# Patient Record
Sex: Male | Born: 1960 | Hispanic: No | Marital: Married | Smoking: Never smoker
Health system: Southern US, Community
[De-identification: ages and names within clinical notes are randomized; demographics above are authoritative.]

## PROBLEM LIST (undated history)

## (undated) DIAGNOSIS — T7840XA Allergy, unspecified, initial encounter: Secondary | ICD-10-CM

## (undated) DIAGNOSIS — E119 Type 2 diabetes mellitus without complications: Secondary | ICD-10-CM

## (undated) DIAGNOSIS — E785 Hyperlipidemia, unspecified: Secondary | ICD-10-CM

## (undated) DIAGNOSIS — I10 Essential (primary) hypertension: Secondary | ICD-10-CM

## (undated) HISTORY — DX: Essential (primary) hypertension: I10

## (undated) HISTORY — DX: Allergy, unspecified, initial encounter: T78.40XA

## (undated) HISTORY — DX: Hyperlipidemia, unspecified: E78.5

## (undated) HISTORY — PX: CARDIAC VALVE REPLACEMENT: SHX585

## (undated) HISTORY — DX: Type 2 diabetes mellitus without complications: E11.9

---

## 2004-12-09 ENCOUNTER — Ambulatory Visit: Payer: Self-pay | Admitting: Cardiovascular Disease

## 2005-01-11 ENCOUNTER — Ambulatory Visit: Payer: Self-pay | Admitting: Oncology

## 2005-01-23 ENCOUNTER — Emergency Department: Payer: Self-pay | Admitting: Emergency Medicine

## 2005-01-24 ENCOUNTER — Other Ambulatory Visit: Payer: Self-pay

## 2005-02-28 ENCOUNTER — Ambulatory Visit: Payer: Self-pay | Admitting: Oncology

## 2014-06-19 LAB — LIPID PANEL
Cholesterol: 264 mg/dL — AB (ref 0–200)
HDL: 28 mg/dL — AB (ref 35–70)
Triglycerides: 914 mg/dL — AB (ref 40–160)

## 2014-06-19 LAB — HEMOGLOBIN A1C: Hgb A1c MFr Bld: 9.6 % — AB (ref 4.0–6.0)

## 2014-08-18 LAB — LIPID PANEL
Cholesterol: 242 mg/dL — AB (ref 0–200)
HDL: 24 mg/dL — AB (ref 35–70)
Triglycerides: 999 mg/dL — AB (ref 40–160)

## 2014-10-15 ENCOUNTER — Encounter: Payer: Self-pay | Admitting: *Deleted

## 2014-10-16 ENCOUNTER — Ambulatory Visit: Payer: Self-pay | Admitting: Endocrinology

## 2014-10-21 ENCOUNTER — Other Ambulatory Visit: Payer: Self-pay | Admitting: Family Medicine

## 2014-10-28 ENCOUNTER — Telehealth: Payer: Self-pay | Admitting: Family Medicine

## 2014-10-28 NOTE — Telephone Encounter (Signed)
E-Fax came through for prior authorization: Rx: Apidra Solostar Pen Rx on your desk

## 2014-10-30 ENCOUNTER — Telehealth: Payer: Self-pay | Admitting: Family Medicine

## 2014-10-30 MED ORDER — INSULIN GLARGINE 300 UNIT/ML ~~LOC~~ SOPN: 50.0000 | PEN_INJECTOR | Freq: Every day | SUBCUTANEOUS | Status: DC

## 2014-10-30 NOTE — Telephone Encounter (Signed)
E-Fax came through for refill: Rx: Insulin Glargine (TOUJEO SOLOSTAR [place]) Copy of Rx in basket

## 2014-11-24 ENCOUNTER — Encounter: Payer: Self-pay | Admitting: Family Medicine

## 2014-11-24 ENCOUNTER — Ambulatory Visit (INDEPENDENT_AMBULATORY_CARE_PROVIDER_SITE_OTHER): Payer: No Typology Code available for payment source | Admitting: Family Medicine

## 2014-11-24 VITALS — BP 147/89 | HR 89 | Temp 98.4°F | Ht 69.0 in | Wt 252.0 lb

## 2014-11-24 DIAGNOSIS — E1129 Type 2 diabetes mellitus with other diabetic kidney complication: Secondary | ICD-10-CM | POA: Insufficient documentation

## 2014-11-24 DIAGNOSIS — I10 Essential (primary) hypertension: Secondary | ICD-10-CM | POA: Diagnosis not present

## 2014-11-24 DIAGNOSIS — I152 Hypertension secondary to endocrine disorders: Secondary | ICD-10-CM | POA: Insufficient documentation

## 2014-11-24 DIAGNOSIS — R809 Proteinuria, unspecified: Secondary | ICD-10-CM | POA: Insufficient documentation

## 2014-11-24 DIAGNOSIS — I482 Chronic atrial fibrillation, unspecified: Secondary | ICD-10-CM | POA: Insufficient documentation

## 2014-11-24 DIAGNOSIS — E119 Type 2 diabetes mellitus without complications: Secondary | ICD-10-CM

## 2014-11-24 DIAGNOSIS — E1169 Type 2 diabetes mellitus with other specified complication: Secondary | ICD-10-CM | POA: Insufficient documentation

## 2014-11-24 DIAGNOSIS — E1159 Type 2 diabetes mellitus with other circulatory complications: Secondary | ICD-10-CM | POA: Insufficient documentation

## 2014-11-24 LAB — COAGUCHEK XS/INR WAIVED
INR: 2.4 — ABNORMAL HIGH (ref 0.9–1.1)
Prothrombin Time: 28.2 s

## 2014-11-24 MED ORDER — BENAZEPRIL HCL 40 MG PO TABS: 40.0000 mg | ORAL_TABLET | Freq: Every day | ORAL | Status: DC

## 2014-11-24 NOTE — Progress Notes (Signed)
   BP 147/89 mmHg  Pulse 89  Temp(Src) 98.4 F (36.9 C)  Ht 5\' 9"  (1.753 m)  Wt 252 lb (114.306 kg)  BMI 37.20 kg/m2  SpO2 96%   Subjective:    Patient ID: Rodney CharyPaul T Draheim [name], male    DOB: [birth year], 54 y.o.   MRN: [id number]  HPI: Rodney Biaul T Vert [name] is a 54 y.o. male  Chief Complaint  Patient presents with  . Atrial Fibrillation  . Hypertension   patient for follow-up atrial fibrillation taken warfarin every day without failure and doing well with no bruising bleeding signs or symptoms or complications.  Blood pressures been elevated taking medications every day  Has been out of Toujeo for 2 weeks as been unable to purchase due to insurance complications.  Relevant past medical, surgical, family and social history reviewed and updated as indicated. Interim medical history since our last visit reviewed. Allergies and medications reviewed and updated.  Review of Systems  Constitutional: Negative.   Respiratory: Negative.   Cardiovascular: Negative.     Per HPI unless specifically indicated above     Objective:    BP 147/89 mmHg  Pulse 89  Temp(Src) 98.4 F (36.9 C)  Ht 5\' 9"  (1.753 m)  Wt 252 lb (114.306 kg)  BMI 37.20 kg/m2  SpO2 96%  Wt Readings from Last 3 Encounters:  11/24/14 252 lb (114.306 kg)  08/18/14 258 lb (117.028 kg)    Physical Exam  Constitutional: He is oriented to person, place, and time. He appears well-developed and well-nourished. No distress.  HENT:  Head: Normocephalic and atraumatic.  Right Ear: Hearing normal.  Left Ear: Hearing normal.  Nose: Nose normal.  Eyes: Conjunctivae and lids are normal. Right eye exhibits no discharge. Left eye exhibits no discharge. No scleral icterus.  Cardiovascular: Normal rate, regular rhythm and normal heart sounds.   Pulmonary/Chest: Effort normal and breath sounds normal. No respiratory distress.  Musculoskeletal: Normal range of motion.  Neurological: He is alert and oriented to person, place, and  time.  Skin: Skin is intact. No rash noted.  Psychiatric: He has a normal mood and affect. His speech is normal and behavior is normal. Judgment and thought content normal. Cognition and memory are normal.    Results for orders placed or performed in visit on 11/24/14  CoaguChek XS/INR Waived  Result Value Ref Range   INR 2.4 (H) 0.9 - 1.1   Prothrombin Time 28.2 sec      Assessment & Plan:   Problem List Items Addressed This Visit      Cardiovascular and Mediastinum   Chronic atrial fibrillation - Primary    The current medical regimen is effective;  continue present plan and medications. Recheck PT/INR one month      Relevant Medications   benazepril (LOTENSIN) 40 MG tablet   Other Relevant Orders   CoaguChek XS/INR Waived (Completed)   Hypertension   Relevant Medications   benazepril (LOTENSIN) 40 MG tablet     Endocrine   Diabetes mellitus without complication    Reviewed medications gave samples coupons check with pharmacist for prescription that his insurance will cover and will give patient a prescription discussed importance of not going without medicine      Relevant Medications   benazepril (LOTENSIN) 40 MG tablet       Follow up plan: Return in about 4 weeks (around 12/22/2014) for Repeat INR, hemoglobin A1c.

## 2014-11-24 NOTE — Assessment & Plan Note (Signed)
The current medical regimen is effective;  continue present plan and medications. Recheck PT/INR one month

## 2014-11-24 NOTE — Assessment & Plan Note (Signed)
Reviewed medications gave samples coupons check with pharmacist for prescription that his insurance will cover and will give patient a prescription discussed importance of not going without medicine

## 2014-12-08 ENCOUNTER — Other Ambulatory Visit: Payer: Self-pay | Admitting: Family Medicine

## 2014-12-08 NOTE — Telephone Encounter (Signed)
Rodney Dixon had previously denied this medication, due to thinking patient has not been seen. Sending to you since you are taking care of Dr.Crissman's.

## 2014-12-25 ENCOUNTER — Ambulatory Visit (INDEPENDENT_AMBULATORY_CARE_PROVIDER_SITE_OTHER): Payer: No Typology Code available for payment source | Admitting: Family Medicine

## 2014-12-25 ENCOUNTER — Encounter: Payer: Self-pay | Admitting: Family Medicine

## 2014-12-25 VITALS — BP 133/83 | HR 86 | Temp 97.7°F | Ht 70.0 in | Wt 262.0 lb

## 2014-12-25 DIAGNOSIS — E119 Type 2 diabetes mellitus without complications: Secondary | ICD-10-CM | POA: Diagnosis not present

## 2014-12-25 DIAGNOSIS — I482 Chronic atrial fibrillation, unspecified: Secondary | ICD-10-CM

## 2014-12-25 DIAGNOSIS — I10 Essential (primary) hypertension: Secondary | ICD-10-CM

## 2014-12-25 LAB — COAGUCHEK XS/INR WAIVED
INR: 3.2 — ABNORMAL HIGH (ref 0.9–1.1)
Prothrombin Time: 38.9 s

## 2014-12-25 LAB — BAYER DCA HB A1C WAIVED: HB A1C (BAYER DCA - WAIVED): 7.9 % — ABNORMAL HIGH (ref <7.0)

## 2014-12-25 MED ORDER — WARFARIN SODIUM 10 MG PO TABS: 10.0000 mg | ORAL_TABLET | Freq: Every day | ORAL | Status: DC

## 2014-12-25 MED ORDER — WARFARIN SODIUM 2 MG PO TABS
2.0000 mg | ORAL_TABLET | Freq: Every day | ORAL | Status: DC
Start: 2014-12-25 — End: 2014-12-27

## 2014-12-25 NOTE — Assessment & Plan Note (Signed)
Doing better with good control now

## 2014-12-25 NOTE — Progress Notes (Signed)
Not my patient

## 2014-12-25 NOTE — Assessment & Plan Note (Signed)
A. fib stable INR slightly elevated will eat more green stuff recheck PT INR 1 month.

## 2014-12-25 NOTE — Progress Notes (Signed)
BP 133/83 mmHg  Pulse 86  Temp(Src) 97.7 F (36.5 C)  Ht  (1.778 m)  Wt 262 lb (118.842 kg)  BMI 37.59 kg/m2  SpO2 97%   Subjective:    Patient ID: Rodney Chary [name], male    DOB: May 05, 1961, 54 y.o.   MRN: [id number]  HPI: Rodney Oommen. is a 54 y.o. male  Chief Complaint  Patient presents with  . Diabetes  . Anticoagulation   reason for follow-up diabetes doing better taking shots not doing [place] yet. Urinary frequency has helped some Blood pressure doing well no complaints from medication. Warfarin doing well no complaints of bruising has not been eating much green stuff lately.  Relevant past medical, surgical, family and social history reviewed and updated as indicated. Interim medical history since our last visit reviewed. Allergies and medications reviewed and updated.  Review of Systems  Constitutional: Negative.   Respiratory: Negative.   Cardiovascular: Negative.     Per HPI unless specifically indicated above     Objective:    BP 133/83 mmHg  Pulse 86  Temp(Src) 97.7 F (36.5 C)  Ht  (1.778 m)  Wt 262 lb (118.842 kg)  BMI 37.59 kg/m2  SpO2 97%  Wt Readings from Last 3 Encounters:  12/25/14 262 lb (118.842 kg)  11/24/14 252 lb (114.306 kg)  08/18/14 258 lb (117.028 kg)    Physical Exam  Constitutional: He is oriented to person, place, and time. He appears well-developed and well-nourished. No distress.  HENT:  Head: Normocephalic and atraumatic.  Right Ear: Hearing normal.  Left Ear: Hearing normal.  Nose: Nose normal.  Eyes: Conjunctivae and lids are normal. Right eye exhibits no discharge. Left eye exhibits no discharge. No scleral icterus.  Cardiovascular: Normal rate, regular rhythm, normal heart sounds and intact distal pulses.   Pulmonary/Chest: Effort normal and breath sounds normal. No respiratory distress.  Musculoskeletal: Normal range of motion.  Neurological: He is alert and oriented to person, place, and time.   Skin: Skin is intact. No rash noted.  Psychiatric: He has a normal mood and affect. His speech is normal and behavior is normal. Judgment and thought content normal. Cognition and memory are normal.    Results for orders placed or performed in visit on 11/24/14  CoaguChek XS/INR Waived  Result Value Ref Range   INR 2.4 (H) 0.9 - 1.1   Prothrombin Time 28.2 sec      Assessment & Plan:   Problem List Items Addressed This Visit      Cardiovascular and Mediastinum   Chronic atrial fibrillation    A. fib stable INR slightly elevated will eat more green stuff recheck PT INR 1 month.      Relevant Medications   warfarin (COUMADIN) 2 MG tablet   warfarin (COUMADIN) 10 MG tablet   Other Relevant Orders   CoaguChek XS/INR Waived   Hypertension    Doing better with good control now      Relevant Medications   warfarin (COUMADIN) 2 MG tablet   warfarin (COUMADIN) 10 MG tablet     Endocrine   Diabetes mellitus without complication - Primary    Patient using medicine faithfully once a lowest it's been in a long time.      Relevant Orders   Bayer DCA Hb A1c Waived       Follow up plan: Return in about 4 weeks (around 01/22/2015), or P, for for INR 3 mo  for PE.

## 2014-12-25 NOTE — Assessment & Plan Note (Signed)
Patient using medicine faithfully once a lowest it's been in a long time.

## 2014-12-27 ENCOUNTER — Other Ambulatory Visit: Payer: Self-pay | Admitting: Family Medicine

## 2015-01-14 ENCOUNTER — Other Ambulatory Visit: Payer: Self-pay | Admitting: Family Medicine

## 2015-01-29 ENCOUNTER — Ambulatory Visit (INDEPENDENT_AMBULATORY_CARE_PROVIDER_SITE_OTHER): Payer: No Typology Code available for payment source | Admitting: Family Medicine

## 2015-01-29 ENCOUNTER — Encounter: Payer: Self-pay | Admitting: Family Medicine

## 2015-01-29 VITALS — BP 125/82 | HR 89 | Temp 98.5°F | Wt 264.0 lb

## 2015-01-29 DIAGNOSIS — Z954 Presence of other heart-valve replacement: Secondary | ICD-10-CM | POA: Insufficient documentation

## 2015-01-29 DIAGNOSIS — Z23 Encounter for immunization: Secondary | ICD-10-CM

## 2015-01-29 DIAGNOSIS — I482 Chronic atrial fibrillation, unspecified: Secondary | ICD-10-CM

## 2015-01-29 DIAGNOSIS — Z952 Presence of prosthetic heart valve: Secondary | ICD-10-CM | POA: Insufficient documentation

## 2015-01-29 LAB — COAGUCHEK XS/INR WAIVED
INR: 3.4 — ABNORMAL HIGH (ref 0.9–1.1)
Prothrombin Time: 40.4 s

## 2015-01-29 MED ORDER — WARFARIN SODIUM 2 MG PO TABS: 2.0000 mg | ORAL_TABLET | Freq: Every day | ORAL | Status: DC

## 2015-01-29 MED ORDER — WARFARIN SODIUM 10 MG PO TABS: 10.0000 mg | ORAL_TABLET | Freq: Every day | ORAL | Status: DC

## 2015-01-29 NOTE — Assessment & Plan Note (Signed)
Stable INR cont current meds

## 2015-01-29 NOTE — Progress Notes (Signed)
   BP 125/82 mmHg  Pulse 89  Temp(Src) 98.5 F (36.9 C)  Wt 264 lb (119.75 kg)  SpO2 97%   Subjective:    Patient ID: Rodney Chary [name], male    DOB: [birth year], 54 y.o.   MRN: [id number]  HPI: Rodney Umholtz. is a 54 y.o. male  Chief Complaint  Patient presents with  . Anticoagulation   Patient follow-up had valve replacement doing well no issues with warfarin no bleeding no bruising Taking medications faithfully Diabetes is also doing well. Still having weight loss issues Relevant past medical, surgical, family and social history reviewed and updated as indicated. Interim medical history since our last visit reviewed. Allergies and medications reviewed and updated.  Review of Systems  Per HPI unless specifically indicated above     Objective:    BP 125/82 mmHg  Pulse 89  Temp(Src) 98.5 F (36.9 C)  Wt 264 lb (119.75 kg)  SpO2 97%  Wt Readings from Last 3 Encounters:  01/29/15 264 lb (119.75 kg)  12/25/14 262 lb (118.842 kg)  11/24/14 252 lb (114.306 kg)    Physical Exam  Constitutional: He is oriented to person, place, and time. He appears well-developed and well-nourished. No distress.  HENT:  Head: Normocephalic and atraumatic.  Right Ear: Hearing normal.  Left Ear: Hearing normal.  Nose: Nose normal.  Eyes: Conjunctivae and lids are normal. Right eye exhibits no discharge. Left eye exhibits no discharge. No scleral icterus.  Cardiovascular: Normal rate, regular rhythm and normal heart sounds.   Pulmonary/Chest: Effort normal and breath sounds normal. No respiratory distress.  Musculoskeletal: Normal range of motion.  Neurological: He is alert and oriented to person, place, and time.  Skin: Skin is intact. No rash noted.  Psychiatric: He has a normal mood and affect. His speech is normal and behavior is normal. Judgment and thought content normal. Cognition and memory are normal.    Results for orders placed or performed in visit on 12/25/14  Bayer  DCA Hb A1c Waived  Result Value Ref Range   Bayer DCA Hb A1c Waived 7.9 (H) <7.0 %  CoaguChek XS/INR Waived  Result Value Ref Range   INR 3.2 (H) 0.9 - 1.1   Prothrombin Time 38.9 sec      Assessment & Plan:   Problem List Items Addressed This Visit      Cardiovascular and Mediastinum   Chronic atrial fibrillation - Primary   Relevant Medications   warfarin (COUMADIN) 2 MG tablet   warfarin (COUMADIN) 10 MG tablet   Other Relevant Orders   CoaguChek XS/INR Waived     Other   S/P aortic valve replacement with metallic valve    Stable INR cont current meds       Other Visit Diagnoses    Immunization due        Relevant Orders    Flu Vaccine QUAD 36+ mos PF IM (Fluarix & Fluzone Quad PF) (Completed)        Follow up plan: Return in about 4 weeks (around 02/26/2015) for INR only 1 month with office visit.

## 2015-02-08 ENCOUNTER — Other Ambulatory Visit: Payer: Self-pay | Admitting: Family Medicine

## 2015-03-02 ENCOUNTER — Encounter: Payer: Self-pay | Admitting: Family Medicine

## 2015-03-02 ENCOUNTER — Ambulatory Visit (INDEPENDENT_AMBULATORY_CARE_PROVIDER_SITE_OTHER): Payer: No Typology Code available for payment source | Admitting: Family Medicine

## 2015-03-02 VITALS — BP 130/80 | HR 108 | Temp 98.9°F | Ht 69.2 in | Wt 263.0 lb

## 2015-03-02 DIAGNOSIS — Z954 Presence of other heart-valve replacement: Secondary | ICD-10-CM | POA: Diagnosis not present

## 2015-03-02 DIAGNOSIS — I482 Chronic atrial fibrillation, unspecified: Secondary | ICD-10-CM

## 2015-03-02 DIAGNOSIS — R079 Chest pain, unspecified: Secondary | ICD-10-CM | POA: Diagnosis not present

## 2015-03-02 LAB — COAGUCHEK XS/INR WAIVED
INR: 4.9 — ABNORMAL HIGH (ref 0.9–1.1)
Prothrombin Time: 58.3 s

## 2015-03-02 MED ORDER — INSULIN GLULISINE 100 UNIT/ML SOLOSTAR PEN: 20.0000 [IU] | PEN_INJECTOR | Freq: Three times a day (TID) | SUBCUTANEOUS | Status: DC

## 2015-03-02 MED ORDER — METFORMIN HCL 500 MG PO TABS: ORAL_TABLET | ORAL | Status: DC

## 2015-03-02 MED ORDER — CARVEDILOL 25 MG PO TABS: 25.0000 mg | ORAL_TABLET | Freq: Two times a day (BID) | ORAL | Status: DC

## 2015-03-02 MED ORDER — ALBIGLUTIDE 50 MG ~~LOC~~ PEN: 1.0000 "pen " | PEN_INJECTOR | SUBCUTANEOUS | Status: DC

## 2015-03-02 MED ORDER — BENAZEPRIL HCL 40 MG PO TABS: ORAL_TABLET | ORAL | Status: DC

## 2015-03-02 MED ORDER — CANAGLIFLOZIN 300 MG PO TABS: 300.0000 mg | ORAL_TABLET | Freq: Every day | ORAL | Status: DC

## 2015-03-02 MED ORDER — GABAPENTIN 300 MG PO CAPS: 600.0000 mg | ORAL_CAPSULE | Freq: Two times a day (BID) | ORAL | Status: DC

## 2015-03-02 MED ORDER — HYDROCHLOROTHIAZIDE 25 MG PO TABS: 25.0000 mg | ORAL_TABLET | Freq: Every day | ORAL | Status: DC

## 2015-03-02 NOTE — Assessment & Plan Note (Signed)
Patient's chest pain most likely secondary to musculoskeletal strain from carrying heavy items. On chart review especially cardiology note patient has not had any follow-up of his aortic valve replacement for years or any cardiology follow-up in kind. We'll make cardiology referral to follow-up chest pain and cardiac valve replacement

## 2015-03-02 NOTE — Progress Notes (Signed)
BP 130/80 mmHg  Pulse 108  Temp(Src) 98.9 F (37.2 C)  Ht 5' 9.2" (1.758 m)  Wt 263 lb (119.296 kg)  BMI 38.60 kg/m2  SpO2 98%   Subjective:    Patient ID: Rodney Chary [name], male    DOB: [birth year], 54 y.o.   MRN: [id number]  HPI: Rodney Dixon. is a 54 y.o. male  Chief Complaint  Patient presents with  . Atrial Fibrillation   patient with multiple conditions going on Has been taken warfarin 12 mg every day without fail same dose he has been on long-term. With good INRs. Patient's INR today of 4.9 patient with no bleeding but does have burns on his arms with scabs that he picks and lose blood. Patient also has run out of carvedilol 25 mg has high blood pressure and rapid pulse beat today Patient also has been out of Toujeo insulin due to insurance constraints Has not taken his 80 units daily that he has been taking. Patient over this weekend was carrying some heavy buckets and developed some left upper chest pain no radiation no nausea vomiting diaphoresis and still having pain. Patient relates maybe more like a strained muscle from working out too much.   Relevant past medical, surgical, family and social history reviewed and updated as indicated. Interim medical history since our last visit reviewed. Allergies and medications reviewed and updated.  Review of Systems  Constitutional: Positive for fatigue. Negative for fever, chills, diaphoresis and activity change.  HENT: Negative.   Eyes: Negative.   Respiratory: Positive for shortness of breath. Negative for cough, choking and chest tightness.   Cardiovascular: Positive for chest pain, palpitations and leg swelling.  Gastrointestinal: Positive for abdominal distention. Negative for abdominal pain.  Endocrine: Positive for polydipsia and polyuria. Negative for polyphagia.  Genitourinary: Negative for dysuria, frequency, hematuria, flank pain and difficulty urinating.  Musculoskeletal: Negative for myalgias and  arthralgias.  Skin: Positive for wound.  Neurological: Negative for dizziness, numbness and headaches.  Hematological: Bruises/bleeds easily.  Psychiatric/Behavioral: Negative for behavioral problems, confusion and agitation.    Per HPI unless specifically indicated above     Objective:    BP 130/80 mmHg  Pulse 108  Temp(Src) 98.9 F (37.2 C)  Ht 5' 9.2" (1.758 m)  Wt 263 lb (119.296 kg)  BMI 38.60 kg/m2  SpO2 98%  Wt Readings from Last 3 Encounters:  03/02/15 263 lb (119.296 kg)  01/29/15 264 lb (119.75 kg)  12/25/14 262 lb (118.842 kg)    Physical Exam  Constitutional: He is oriented to person, place, and time. He appears well-developed and well-nourished. No distress.  HENT:  Head: Normocephalic and atraumatic.  Right Ear: Hearing and external ear normal.  Left Ear: Hearing and external ear normal.  Nose: Nose normal.  Eyes: Conjunctivae, EOM and lids are normal. Pupils are equal, round, and reactive to light. Right eye exhibits no discharge. Left eye exhibits no discharge. No scleral icterus.  Neck: Normal range of motion. Neck supple. No tracheal deviation present.  Cardiovascular:  Tachycardia with irregular rhythm  Pulmonary/Chest: Effort normal. No respiratory distress. He has no wheezes. He has no rales.  Abdominal: Soft. Bowel sounds are normal.  Musculoskeletal: Normal range of motion. He exhibits no edema or tenderness.  Lymphadenopathy:    He has no cervical adenopathy.  Neurological: He is alert and oriented to person, place, and time. He displays normal reflexes.  Skin: Skin is intact. No rash noted.  Psychiatric: He has a normal  mood and affect. His speech is normal and behavior is normal. Judgment and thought content normal. Cognition and memory are normal.   EKG sinus tachycardia changes  Results for orders placed or performed in visit on 01/29/15  CoaguChek XS/INR Waived  Result Value Ref Range   INR 3.4 (H) 0.9 - 1.1   Prothrombin Time 40.4 sec       Assessment & Plan:   Problem List Items Addressed This Visit      Cardiovascular and Mediastinum   Chronic atrial fibrillation (HCC) - Primary   Relevant Medications   benazepril (LOTENSIN) 40 MG tablet   carvedilol (COREG) 25 MG tablet   hydrochlorothiazide (HYDRODIURIL) 25 MG tablet   Other Relevant Orders   CoaguChek XS/INR Waived   EKG 12-Lead (Completed)   Ambulatory referral to Cardiology     Other   S/P aortic valve replacement with metallic valve    INR previously stable all summer long on 12 mg day with no noted change by patient no double dosing no significant change in diet We will hold warfarin today and tomorrow. Then restart same dose recheck INR in 1 week Precautions given about bleeding and trauma      Relevant Orders   Ambulatory referral to Cardiology   Chest pain    Patient's chest pain most likely secondary to musculoskeletal strain from carrying heavy items. On chart review especially cardiology note patient has not had any follow-up of his aortic valve replacement for years or any cardiology follow-up in kind. We'll make cardiology referral to follow-up chest pain and cardiac valve replacement      Relevant Orders   Ambulatory referral to Cardiology       Follow up plan: Return 1 week for INR, for pending w/u also.

## 2015-03-02 NOTE — Assessment & Plan Note (Signed)
INR previously stable all summer long on 12 mg day with no noted change by patient no double dosing no significant change in diet We will hold warfarin today and tomorrow. Then restart same dose recheck INR in 1 week Precautions given about bleeding and trauma

## 2015-03-09 ENCOUNTER — Ambulatory Visit (INDEPENDENT_AMBULATORY_CARE_PROVIDER_SITE_OTHER): Payer: No Typology Code available for payment source | Admitting: Family Medicine

## 2015-03-09 ENCOUNTER — Encounter: Payer: Self-pay | Admitting: Family Medicine

## 2015-03-09 VITALS — BP 153/92 | HR 89 | Temp 97.8°F | Ht 69.2 in | Wt 266.0 lb

## 2015-03-09 DIAGNOSIS — I482 Chronic atrial fibrillation, unspecified: Secondary | ICD-10-CM

## 2015-03-09 LAB — COAGUCHEK XS/INR WAIVED
INR: 1.1 (ref 0.9–1.1)
Prothrombin Time: 13.2 s

## 2015-03-09 NOTE — Progress Notes (Signed)
   BP 153/92 mmHg  Pulse 89  Temp(Src) 97.8 F (36.6 C)  Ht 5' 9.2" (1.758 m)  Wt 266 lb (120.657 kg)  BMI 39.04 kg/m2  SpO2 99%   Subjective:    Patient ID: Rodney CharyPaul T Luby [name], male    DOB: 05-25-1960, 54 y.o.   MRN: [id number]  HPI: Rodney Biaul T Ruggiero [name] is a 54 y.o. male  Chief Complaint  Patient presents with  . Atrial Fibrillation   Patient held his warfarin for high INR levels patient with some bleeding on his arms during this time which is resolved. Patient is started back at 10 mg and INR is now 1.1 age with no complaints Relevant past medical, surgical, family and social history reviewed and updated as indicated. Interim medical history since our last visit reviewed. Allergies and medications reviewed and updated.  Review of Systems  No bleeding bruising  Per HPI unless specifically indicated above     Objective:    BP 153/92 mmHg  Pulse 89  Temp(Src) 97.8 F (36.6 C)  Ht 5' 9.2" (1.758 m)  Wt 266 lb (120.657 kg)  BMI 39.04 kg/m2  SpO2 99%  Wt Readings from Last 3 Encounters:  03/09/15 266 lb (120.657 kg)  03/02/15 263 lb (119.296 kg)  01/29/15 264 lb (119.75 kg)    Physical Exam  Constitutional: He is oriented to person, place, and time. He appears well-developed and well-nourished. No distress.  HENT:  Head: Normocephalic and atraumatic.  Right Ear: Hearing normal.  Left Ear: Hearing normal.  Nose: Nose normal.  Eyes: Conjunctivae and lids are normal. Right eye exhibits no discharge. Left eye exhibits no discharge. No scleral icterus.  Pulmonary/Chest: Effort normal and breath sounds normal. No respiratory distress.  Musculoskeletal: Normal range of motion.  Neurological: He is alert and oriented to person, place, and time.  Skin: Skin is intact. No rash noted.  Psychiatric: He has a normal mood and affect. His speech is normal and behavior is normal. Judgment and thought content normal. Cognition and memory are normal.    Results for orders placed  or performed in visit on 03/02/15  CoaguChek XS/INR Waived  Result Value Ref Range   INR 4.9 (H) 0.9 - 1.1   Prothrombin Time 58.3 sec      Assessment & Plan:   Problem List Items Addressed This Visit      Cardiovascular and Mediastinum   Chronic atrial fibrillation (HCC) - Primary   Relevant Orders   CoaguChek XS/INR Waived       Follow up plan: Return in about 2 weeks (around 03/23/2015) for PT INR.

## 2015-03-09 NOTE — Assessment & Plan Note (Signed)
Discuss low INR and abnormal results will go back to 12 mg a day Recheck INR 2 weeks

## 2015-03-18 ENCOUNTER — Encounter: Payer: No Typology Code available for payment source | Admitting: Family Medicine

## 2015-03-19 ENCOUNTER — Other Ambulatory Visit: Payer: Self-pay | Admitting: Family Medicine

## 2015-03-19 MED ORDER — INSULIN LISPRO 100 UNIT/ML (KWIKPEN): 20.0000 [IU] | PEN_INJECTOR | Freq: Three times a day (TID) | SUBCUTANEOUS | Status: DC

## 2015-03-20 ENCOUNTER — Telehealth: Payer: Self-pay | Admitting: Family Medicine

## 2015-03-20 NOTE — Telephone Encounter (Signed)
Pharmacy checking status of PA for Apidra.

## 2015-03-23 NOTE — Telephone Encounter (Signed)
Medication was denied

## 2015-04-07 ENCOUNTER — Encounter: Payer: Self-pay | Admitting: Family Medicine

## 2015-04-07 ENCOUNTER — Ambulatory Visit (INDEPENDENT_AMBULATORY_CARE_PROVIDER_SITE_OTHER): Payer: No Typology Code available for payment source | Admitting: Family Medicine

## 2015-04-07 VITALS — BP 124/87 | HR 91 | Temp 98.9°F | Ht 69.2 in | Wt 272.0 lb

## 2015-04-07 DIAGNOSIS — J019 Acute sinusitis, unspecified: Secondary | ICD-10-CM | POA: Insufficient documentation

## 2015-04-07 MED ORDER — AMOXICILLIN-POT CLAVULANATE 875-125 MG PO TABS: 1.0000 | ORAL_TABLET | Freq: Two times a day (BID) | ORAL | Status: DC

## 2015-04-07 MED ORDER — HYDROCOD POLST-CPM POLST ER 10-8 MG/5ML PO SUER: 2.5000 mL | Freq: Two times a day (BID) | ORAL | Status: DC | PRN

## 2015-04-07 NOTE — Progress Notes (Signed)
BP 124/87 mmHg  Pulse 91  Temp(Src) 98.9 F (37.2 C)  Ht 5' 9.2" (1.758 m)  Wt 272 lb (123.378 kg)  BMI 39.92 kg/m2  SpO2 92%   Subjective:    Patient ID: Rodney CharyPaul T Melfi [name], male    DOB: [birth year], 54 y.o.   MRN: [id number]  HPI: Rodney Biaul T Hannay [name] is a 54 y.o. male  Chief Complaint  Patient presents with  . URI    x4days   patient with marked sinus congestion and drainage feeling bad marked facial pressure sloshing-type sensation  Has taken Mcinex Mucinex D  Has been running a fever and getting worse with marked cough and not sleeping   Relevant past medical, surgical, family and social history reviewed and updated as indicated. Interim medical history since our last visit reviewed. Allergies and medications reviewed and updated.  Review of Systems  Constitutional: Positive for fever, chills and fatigue.  HENT: Positive for congestion, postnasal drip, rhinorrhea, sinus pressure, sneezing, sore throat and voice change.   Respiratory: Positive for cough. Negative for shortness of breath.   Cardiovascular: Negative for chest pain, palpitations and leg swelling.  Gastrointestinal: Negative for abdominal distention.    Per HPI unless specifically indicated above     Objective:    BP 124/87 mmHg  Pulse 91  Temp(Src) 98.9 F (37.2 C)  Ht 5' 9.2" (1.758 m)  Wt 272 lb (123.378 kg)  BMI 39.92 kg/m2  SpO2 92%  Wt Readings from Last 3 Encounters:  04/07/15 272 lb (123.378 kg)  03/09/15 266 lb (120.657 kg)  03/02/15 263 lb (119.296 kg)    Physical Exam  Constitutional: He is oriented to person, place, and time. He appears well-developed and well-nourished. No distress.  HENT:  Head: Normocephalic and atraumatic.  Right Ear: Hearing and external ear normal.  Left Ear: Hearing and external ear normal.  Nose: Nose normal.  Mouth/Throat: Oropharyngeal exudate present.  Eyes: Conjunctivae and lids are normal. Right eye exhibits no discharge. Left eye exhibits no  discharge. No scleral icterus.  Neck: Normal range of motion.  Cardiovascular: Normal heart sounds.   Pulmonary/Chest: Effort normal and breath sounds normal. No respiratory distress. He has no wheezes. He has no rales. He exhibits no tenderness.  Musculoskeletal: Normal range of motion.  Lymphadenopathy:    He has no cervical adenopathy.  Neurological: He is alert and oriented to person, place, and time.  Skin: Skin is intact. No rash noted.  Psychiatric: He has a normal mood and affect. His speech is normal and behavior is normal. Judgment and thought content normal. Cognition and memory are normal.    Results for orders placed or performed in visit on 03/09/15  CoaguChek XS/INR Waived  Result Value Ref Range   INR 1.1 0.9 - 1.1   Prothrombin Time 13.2 sec      Assessment & Plan:   Problem List Items Addressed This Visit      Respiratory   Sinusitis, acute - Primary    Discussed sinusitis care and treatment use of over-the-counter medications such as Mucinex Mucinex D, Tylenol Cautioned about use of Tussionex containing codeine not to drive or work using medicine just use at nighttime If not better will need to recheck Discuss ways to minimize side effects from Augmentin      Relevant Medications   amoxicillin-clavulanate (AUGMENTIN) 875-125 MG tablet   chlorpheniramine-HYDROcodone (TUSSIONEX PENNKINETIC ER) 10-8 MG/5ML SUER       Follow up plan: Return for As scheduled.

## 2015-04-07 NOTE — Assessment & Plan Note (Addendum)
Discussed sinusitis care and treatment use of over-the-counter medications such as Mucinex Mucinex D, Tylenol Cautioned about use of Tussionex containing codeine not to drive or work using medicine just use at nighttime If not better will need to recheck Discuss ways to minimize side effects from Augmentin

## 2015-04-10 ENCOUNTER — Other Ambulatory Visit: Payer: Self-pay | Admitting: Family Medicine

## 2015-04-15 ENCOUNTER — Encounter: Payer: Self-pay | Admitting: Family Medicine

## 2015-04-15 ENCOUNTER — Ambulatory Visit (INDEPENDENT_AMBULATORY_CARE_PROVIDER_SITE_OTHER): Payer: No Typology Code available for payment source | Admitting: Family Medicine

## 2015-04-15 VITALS — BP 166/96 | HR 94 | Temp 98.1°F | Ht 69.0 in | Wt 262.0 lb

## 2015-04-15 DIAGNOSIS — E119 Type 2 diabetes mellitus without complications: Secondary | ICD-10-CM

## 2015-04-15 DIAGNOSIS — Z113 Encounter for screening for infections with a predominantly sexual mode of transmission: Secondary | ICD-10-CM

## 2015-04-15 DIAGNOSIS — I482 Chronic atrial fibrillation, unspecified: Secondary | ICD-10-CM

## 2015-04-15 DIAGNOSIS — Z1211 Encounter for screening for malignant neoplasm of colon: Secondary | ICD-10-CM | POA: Diagnosis not present

## 2015-04-15 DIAGNOSIS — E785 Hyperlipidemia, unspecified: Secondary | ICD-10-CM

## 2015-04-15 DIAGNOSIS — Z0000 Encounter for general adult medical examination without abnormal findings: Secondary | ICD-10-CM

## 2015-04-15 DIAGNOSIS — E1169 Type 2 diabetes mellitus with other specified complication: Secondary | ICD-10-CM | POA: Insufficient documentation

## 2015-04-15 LAB — URINALYSIS, ROUTINE W REFLEX MICROSCOPIC
Bilirubin, UA: NEGATIVE
Ketones, UA: NEGATIVE
Leukocytes, UA: NEGATIVE
Nitrite, UA: NEGATIVE
Specific Gravity, UA: >1.03 — ABNORMAL HIGH (ref 1.005–1.030)
Urobilinogen, Ur: 0.2 mg/dL (ref 0.2–1.0)
pH, UA: 6 (ref 5.0–7.5)

## 2015-04-15 LAB — MICROSCOPIC EXAMINATION

## 2015-04-15 LAB — COAGUCHEK XS/INR WAIVED
INR: 3.7 — ABNORMAL HIGH (ref 0.9–1.1)
Prothrombin Time: 44.3 s

## 2015-04-15 LAB — BAYER DCA HB A1C WAIVED: HB A1C (BAYER DCA - WAIVED): 9.4 % — ABNORMAL HIGH (ref <7.0)

## 2015-04-15 MED ORDER — WARFARIN SODIUM 2 MG PO TABS: 2.0000 mg | ORAL_TABLET | Freq: Every day | ORAL | Status: DC

## 2015-04-15 MED ORDER — WARFARIN SODIUM 10 MG PO TABS: 10.0000 mg | ORAL_TABLET | Freq: Every day | ORAL | Status: DC

## 2015-04-15 MED ORDER — BENZONATATE 100 MG PO CAPS: 100.0000 mg | ORAL_CAPSULE | Freq: Two times a day (BID) | ORAL | Status: DC | PRN

## 2015-04-15 NOTE — Progress Notes (Signed)
BP 166/96 mmHg  Pulse 94  Temp(Src) 98.1 F (36.7 C)  Ht 5\' 9"  (1.753 m)  Wt 262 lb (118.842 kg)  BMI 38.67 kg/m2  SpO2 93%   Subjective:    Patient ID: Rodney CharyPaul T Bullinger [name], male    DOB: 02/03/1961, 54 y.o.   MRN: [id number]  HPI: Rodney Biaul T Bugge [name] is a 54 y.o. male  Chief Complaint  Patient presents with  . Annual Exam  . Atrial Fibrillation  . Diabetes   Patient with multiple issues finished up antibiotic somewhat better but still coughing his head off especially at night has finished us in next but needs something else for cough. A. fib doing okay is been taking a lot of cough cold medicine and antibiotics probably pushed his INR up just a little bit which is 3.7 today will continue current medications patient will eat a healthy diet Diabetes patient ran out of his insulin about a week ago or so has struggled with dosing  Relevant past medical, surgical, family and social history reviewed and updated as indicated. Interim medical history since our last visit reviewed. Allergies and medications reviewed and updated.  Review of Systems  Constitutional: Negative.   HENT: Negative.   Eyes: Negative.   Respiratory: Negative.   Cardiovascular: Negative.   Gastrointestinal: Negative.   Endocrine: Negative.   Genitourinary: Negative.   Musculoskeletal: Negative.   Skin: Negative.   Allergic/Immunologic: Negative.   Neurological: Negative.   Hematological: Negative.   Psychiatric/Behavioral: Negative.     Per HPI unless specifically indicated above     Objective:    BP 166/96 mmHg  Pulse 94  Temp(Src) 98.1 F (36.7 C)  Ht 5\' 9"  (1.753 m)  Wt 262 lb (118.842 kg)  BMI 38.67 kg/m2  SpO2 93%  Wt Readings from Last 3 Encounters:  04/15/15 262 lb (118.842 kg)  04/07/15 272 lb (123.378 kg)  03/09/15 266 lb (120.657 kg)    Physical Exam  Constitutional: He is oriented to person, place, and time. He appears well-developed and well-nourished.  HENT:  Head:  Normocephalic and atraumatic.  Right Ear: External ear normal.  Left Ear: External ear normal.  Eyes: Conjunctivae and EOM are normal. Pupils are equal, round, and reactive to light.  Neck: Normal range of motion. Neck supple.  Cardiovascular: Normal rate, regular rhythm, normal heart sounds and intact distal pulses.   Pulmonary/Chest: Effort normal and breath sounds normal.  Abdominal: Soft. Bowel sounds are normal. There is no splenomegaly or hepatomegaly.  Big belly  Genitourinary: Rectum normal, prostate normal and penis normal.  Musculoskeletal: Normal range of motion.  Neurological: He is alert and oriented to person, place, and time. He has normal reflexes.  Skin: No rash noted. No erythema.  Psychiatric: He has a normal mood and affect. His behavior is normal. Judgment and thought content normal.    Results for orders placed or performed in visit on 03/09/15  CoaguChek XS/INR Waived  Result Value Ref Range   INR 1.1 0.9 - 1.1   Prothrombin Time 13.2 sec      Assessment & Plan:   Problem List Items Addressed This Visit      Cardiovascular and Mediastinum   Chronic atrial fibrillation (HCC)   Relevant Medications   warfarin (COUMADIN) 10 MG tablet   warfarin (COUMADIN) 2 MG tablet   Other Relevant Orders   CoaguChek XS/INR Waived     Endocrine   Diabetes mellitus without complication (HCC)    Discuss with patient  long-term poor control Because of long-term poor control with multiple attempts to better control unsuccessful,  will refer to endocrinology for further management and treatment.      Relevant Orders   Bayer DCA Hb A1c Waived   Ambulatory referral to Endocrinology     Other   Hyperlipidemia   Relevant Medications   warfarin (COUMADIN) 10 MG tablet   warfarin (COUMADIN) 2 MG tablet    Other Visit Diagnoses    Routine general medical examination at a health care facility    -  Primary    Relevant Orders    CBC with Differential/Platelet     Comprehensive metabolic panel    Lipid Panel w/o Chol/HDL Ratio    PSA    TSH    Urinalysis, Routine w reflex microscopic (not at [facility])    Routine screening for STI (sexually transmitted infection)        Relevant Orders    Hepatitis C Antibody    HIV antibody    Colon cancer screening        Relevant Orders    Ambulatory referral to General Surgery       Patient with skin tag in left groin are will schedule excision of the skin tag is gtten large will scratching bleedhas become very bothersome  Use Tessalon Perles for cough patient education given  Specially encourage weight loss  Follow up plan: Return in about 6 months (around 10/14/2015), or if symptoms worsen or fail to improve.

## 2015-04-15 NOTE — Assessment & Plan Note (Signed)
Discuss with patient long-term poor control Because of long-term poor control with multiple attempts to better control unsuccessful,  will refer to endocrinology for further management and treatment.

## 2015-04-16 ENCOUNTER — Telehealth: Payer: Self-pay | Admitting: Family Medicine

## 2015-04-16 DIAGNOSIS — E781 Pure hyperglyceridemia: Secondary | ICD-10-CM

## 2015-04-16 LAB — TSH: TSH: 1.93 u[IU]/mL (ref 0.450–4.500)

## 2015-04-16 LAB — COMPREHENSIVE METABOLIC PANEL
ALT: 22 IU/L (ref 0–44)
AST: 14 IU/L (ref 0–40)
Albumin/Globulin Ratio: 1.6 (ref 1.1–2.5)
Albumin: 4.2 g/dL (ref 3.5–5.5)
Alkaline Phosphatase: 95 IU/L (ref 39–117)
BUN/Creatinine Ratio: 10 (ref 9–20)
BUN: 9 mg/dL (ref 6–24)
Bilirubin Total: 0.3 mg/dL (ref 0.0–1.2)
CO2: 27 mmol/L (ref 18–29)
Calcium: 9 mg/dL (ref 8.7–10.2)
Chloride: 93 mmol/L — ABNORMAL LOW (ref 97–106)
Creatinine, Ser: 0.86 mg/dL (ref 0.76–1.27)
GFR calc Af Amer: 113 mL/min/{1.73_m2} (ref >59)
GFR calc non Af Amer: 98 mL/min/{1.73_m2} (ref >59)
Globulin, Total: 2.6 g/dL (ref 1.5–4.5)
Glucose: 245 mg/dL — ABNORMAL HIGH (ref 65–99)
Potassium: 3.9 mmol/L (ref 3.5–5.2)
Sodium: 135 mmol/L — ABNORMAL LOW (ref 136–144)
Total Protein: 6.8 g/dL (ref 6.0–8.5)

## 2015-04-16 LAB — CBC WITH DIFFERENTIAL/PLATELET
Basophils Absolute: 0.1 10*3/uL (ref 0.0–0.2)
Basos: 1 %
EOS (ABSOLUTE): 0.4 10*3/uL (ref 0.0–0.4)
Eos: 4 %
Hematocrit: 42.2 % (ref 37.5–51.0)
Hemoglobin: 14.3 g/dL (ref 12.6–17.7)
Immature Grans (Abs): 0.1 10*3/uL (ref 0.0–0.1)
Immature Granulocytes: 1 %
Lymphocytes Absolute: 2.7 10*3/uL (ref 0.7–3.1)
Lymphs: 29 %
MCH: 29 pg (ref 26.6–33.0)
MCHC: 33.9 g/dL (ref 31.5–35.7)
MCV: 86 fL (ref 79–97)
Monocytes Absolute: 0.6 10*3/uL (ref 0.1–0.9)
Monocytes: 6 %
Neutrophils Absolute: 5.4 10*3/uL (ref 1.4–7.0)
Neutrophils: 59 %
Platelets: 232 10*3/uL (ref 150–379)
RBC: 4.93 x10E6/uL (ref 4.14–5.80)
RDW: 14.2 % (ref 12.3–15.4)
WBC: 9.2 10*3/uL (ref 3.4–10.8)

## 2015-04-16 LAB — LIPID PANEL W/O CHOL/HDL RATIO
Cholesterol, Total: 223 mg/dL — ABNORMAL HIGH (ref 100–199)
HDL: 22 mg/dL — ABNORMAL LOW (ref >39)
Triglycerides: 1123 mg/dL (ref 0–149)

## 2015-04-16 LAB — PSA: Prostate Specific Ag, Serum: 0.3 ng/mL (ref 0.0–4.0)

## 2015-04-16 LAB — HIV ANTIBODY (ROUTINE TESTING W REFLEX): HIV Screen 4th Generation wRfx: NONREACTIVE

## 2015-04-16 LAB — HEPATITIS C ANTIBODY: Hep C Virus Ab: <0.1 s/co ratio (ref 0.0–0.9)

## 2015-04-16 MED ORDER — FENOFIBRATE 160 MG PO TABS: 160.0000 mg | ORAL_TABLET | Freq: Every day | ORAL | Status: DC

## 2015-04-16 NOTE — Telephone Encounter (Signed)
Phone call Discussed with patient triglycerides dangerously high reviewed will start medication today recheck lipid panel next diabetes visit

## 2015-04-16 NOTE — Telephone Encounter (Signed)
-----   Message from Lurlean HornsNancy H Wilson, CMA sent at 04/16/2015 12:19 PM EST ----- labs

## 2015-05-13 ENCOUNTER — Encounter: Payer: Self-pay | Admitting: Family Medicine

## 2015-05-13 ENCOUNTER — Ambulatory Visit (INDEPENDENT_AMBULATORY_CARE_PROVIDER_SITE_OTHER): Payer: BLUE CROSS/BLUE SHIELD | Admitting: Family Medicine

## 2015-05-13 VITALS — BP 136/87 | HR 90 | Temp 98.1°F | Ht 69.0 in | Wt 264.0 lb

## 2015-05-13 DIAGNOSIS — I482 Chronic atrial fibrillation, unspecified: Secondary | ICD-10-CM

## 2015-05-13 NOTE — Assessment & Plan Note (Signed)
Continue current dose of medication discussed bleeding will address skin tag and later date when patient is more comfortable

## 2015-05-13 NOTE — Progress Notes (Signed)
BP 136/87 mmHg  Pulse 90  Temp(Src) 98.1 F (36.7 C)  Ht 5\' 9"  (1.753 m)  Wt 264 lb (119.75 kg)  BMI 38.97 kg/m2  SpO2 99%   Subjective:    Patient ID: Rodney Chary [name], male    DOB: 1961-04-28, 55 y.o.   MRN: [id number]  HPI: Rodney Dixon. is a 55 y.o. male  Chief Complaint  Patient presents with  . Atrial Fibrillation  . skin tag removal   Patient doing well with warfarin no complaints Concerned about having skin tag removed decided not to have it removed today Has had some minor skin scrape with a lot of bleeding Taking warfarin faithfully Relevant past medical, surgical, family and social history reviewed and updated as indicated. Interim medical history since our last visit reviewed. Allergies and medications reviewed and updated.  Review of Systems  Constitutional: Negative.   Respiratory: Negative.   Cardiovascular: Negative.     Per HPI unless specifically indicated above     Objective:    BP 136/87 mmHg  Pulse 90  Temp(Src) 98.1 F (36.7 C)  Ht 5\' 9"  (1.753 m)  Wt 264 lb (119.75 kg)  BMI 38.97 kg/m2  SpO2 99%  Wt Readings from Last 3 Encounters:  05/13/15 264 lb (119.75 kg)  04/15/15 262 lb (118.842 kg)  04/07/15 272 lb (123.378 kg)    Physical Exam  Constitutional: He is oriented to person, place, and time. He appears well-developed and well-nourished. No distress.  HENT:  Head: Normocephalic and atraumatic.  Right Ear: Hearing normal.  Left Ear: Hearing normal.  Nose: Nose normal.  Eyes: Conjunctivae and lids are normal. Right eye exhibits no discharge. Left eye exhibits no discharge. No scleral icterus.  Pulmonary/Chest: Effort normal and breath sounds normal. No respiratory distress.  Musculoskeletal: Normal range of motion.  Neurological: He is alert and oriented to person, place, and time.  Skin: Skin is intact. No rash noted.  Psychiatric: He has a normal mood and affect. His speech is normal and behavior is normal. Judgment and  thought content normal. Cognition and memory are normal.    Results for orders placed or performed in visit on 04/15/15  Microscopic Examination  Result Value Ref Range   WBC, UA 0-5 0 -  5 /hpf   RBC, UA 0-2 0 -  2 /hpf   Epithelial Cells (non renal) 0-10 0 - 10 /hpf   Mucus, UA Present Not Estab.   Bacteria, UA Few None seen/Few  Bayer DCA Hb A1c Waived  Result Value Ref Range   Bayer DCA Hb A1c Waived 9.4 (H) <7.0 %  CoaguChek XS/INR Waived  Result Value Ref Range   INR 3.7 (H) 0.9 - 1.1   Prothrombin Time 44.3 sec  CBC with Differential/Platelet  Result Value Ref Range   WBC 9.2 3.4 - 10.8 x10E3/uL   RBC 4.93 4.14 - 5.80 x10E6/uL   Hemoglobin 14.3 12.6 - 17.7 g/dL   Hematocrit 70.2 63.7 - 51.0 %   MCV 86 79 - 97 fL   MCH 29.0 26.6 - 33.0 pg   MCHC 33.9 31.5 - 35.7 g/dL   RDW 85.8 85.0 - 27.7 %   Platelets 232 150 - 379 x10E3/uL   Neutrophils 59 %   Lymphs 29 %   Monocytes 6 %   Eos 4 %   Basos 1 %   Neutrophils Absolute 5.4 1.4 - 7.0 x10E3/uL   Lymphocytes Absolute 2.7 0.7 - 3.1 x10E3/uL  Monocytes Absolute 0.6 0.1 - 0.9 x10E3/uL   EOS (ABSOLUTE) 0.4 0.0 - 0.4 x10E3/uL   Basophils Absolute 0.1 0.0 - 0.2 x10E3/uL   Immature Granulocytes 1 %   Immature Grans (Abs) 0.1 0.0 - 0.1 x10E3/uL  Comprehensive metabolic panel  Result Value Ref Range   Glucose 245 (H) 65 - 99 mg/dL   BUN 9 6 - 24 mg/dL   Creatinine, Ser 1.610.86 0.76 - 1.27 mg/dL   GFR calc non Af Amer 98 >59 mL/min/1.73   GFR calc Af Amer 113 >59 mL/min/1.73   BUN/Creatinine Ratio 10 9 - 20   Sodium 135 (L) 136 - 144 mmol/L   Potassium 3.9 3.5 - 5.2 mmol/L   Chloride 93 (L) 97 - 106 mmol/L   CO2 27 18 - 29 mmol/L   Calcium 9.0 8.7 - 10.2 mg/dL   Total Protein 6.8 6.0 - 8.5 g/dL   Albumin 4.2 3.5 - 5.5 g/dL   Globulin, Total 2.6 1.5 - 4.5 g/dL   Albumin/Globulin Ratio 1.6 1.1 - 2.5   Bilirubin Total 0.3 0.0 - 1.2 mg/dL   Alkaline Phosphatase 95 39 - 117 IU/L   AST 14 0 - 40 IU/L   ALT 22 0 - 44 IU/L   Lipid Panel w/o Chol/HDL Ratio  Result Value Ref Range   Cholesterol, Total 223 (H) 100 - 199 mg/dL   Triglycerides 09601123 (HH) 0 - 149 mg/dL   HDL 22 (L) >45>39 mg/dL   VLDL Cholesterol Cal Comment 5 - 40 mg/dL   LDL Calculated Comment 0 - 99 mg/dL  PSA  Result Value Ref Range   Prostate Specific Ag, Serum 0.3 0.0 - 4.0 ng/mL  TSH  Result Value Ref Range   TSH 1.930 0.450 - 4.500 uIU/mL  Urinalysis, Routine w reflex microscopic (not [facility])  Result Value Ref Range   Specific Gravity, UA >1.030 (H) 1.005 - 1.030   pH, UA 6.0 5.0 - 7.5   Color, UA Yellow Yellow   Appearance Ur Clear Clear   Leukocytes, UA Negative Negative   Protein, UA 2+ (A) Negative/Trace   Glucose, UA Trace (A) Negative   Ketones, UA Negative Negative   RBC, UA Trace (A) Negative   Bilirubin, UA Negative Negative   Urobilinogen, Ur 0.2 0.2 - 1.0 mg/dL   Nitrite, UA Negative Negative   Microscopic Examination See below:   Hepatitis C Antibody  Result Value Ref Range   Hep C Virus Ab <0.1 0.0 - 0.9 s/co ratio  HIV antibody  Result Value Ref Range   HIV Screen 4th Generation wRfx Non Reactive Non Reactive      Assessment & Plan:   Problem List Items Addressed This Visit      Cardiovascular and Mediastinum   Chronic atrial fibrillation (HCC) - Primary    Continue current dose of medication discussed bleeding will address skin tag and later date when patient is more comfortable      Relevant Orders   CoaguChek XS/INR Waived       Follow up plan: Return in about 4 weeks (around 06/10/2015) for INR.

## 2015-05-14 LAB — COAGUCHEK XS/INR WAIVED
INR: 3.6 — ABNORMAL HIGH (ref 0.9–1.1)
Prothrombin Time: 43.1 s

## 2015-06-04 ENCOUNTER — Other Ambulatory Visit: Payer: Self-pay | Admitting: Family Medicine

## 2015-06-08 ENCOUNTER — Other Ambulatory Visit: Payer: Self-pay | Admitting: Family Medicine

## 2015-06-11 ENCOUNTER — Ambulatory Visit (INDEPENDENT_AMBULATORY_CARE_PROVIDER_SITE_OTHER): Payer: BLUE CROSS/BLUE SHIELD | Admitting: Family Medicine

## 2015-06-11 ENCOUNTER — Encounter: Payer: Self-pay | Admitting: Family Medicine

## 2015-06-11 VITALS — BP 123/77 | HR 87 | Temp 97.7°F | Ht 69.1 in | Wt 264.0 lb

## 2015-06-11 DIAGNOSIS — I482 Chronic atrial fibrillation, unspecified: Secondary | ICD-10-CM

## 2015-06-11 LAB — COAGUCHEK XS/INR WAIVED
INR: 4.1 — ABNORMAL HIGH (ref 0.9–1.1)
Prothrombin Time: 48.9 s

## 2015-06-11 NOTE — Assessment & Plan Note (Signed)
Discussed with patient to eat a consistent amount of green stuff hold warfarin today and then restart same dose continue current green stuff recheck INR 2 weeks

## 2015-06-11 NOTE — Progress Notes (Signed)
   BP 123/77 mmHg  Pulse 87  Temp(Src) 97.7 F (36.5 C)  Ht 5' 9.1" (1.755 m)  Wt 264 lb (119.75 kg)  BMI 38.88 kg/m2  SpO2 94%   Subjective:    Patient ID: Rodney Chary [name], male    DOB: Feb 24, 1961, 55 y.o.   MRN: [id number]  HPI: Rodney Euceda. is a 55 y.o. male  Chief Complaint  Patient presents with  . Atrial Fibrillation   discussed atrial fibrillation and vitamin K Patient intermittently eating greens reviewed to have greens on a regular basis are no greens at all but eating greens are healthier. INR today of 4.1 Patient with no bleeding no bruising complications   Relevant past medical, surgical, family and social history reviewed and updated as indicated. Interim medical history since our last visit reviewed. Allergies and medications reviewed and updated.  Review of Systems  Constitutional: Negative.   Respiratory: Negative.   Cardiovascular: Negative.     Per HPI unless specifically indicated above     Objective:    BP 123/77 mmHg  Pulse 87  Temp(Src) 97.7 F (36.5 C)  Ht 5' 9.1" (1.755 m)  Wt 264 lb (119.75 kg)  BMI 38.88 kg/m2  SpO2 94%  Wt Readings from Last 3 Encounters:  06/11/15 264 lb (119.75 kg)  05/13/15 264 lb (119.75 kg)  04/15/15 262 lb (118.842 kg)    Physical Exam  Constitutional: He is oriented to person, place, and time. He appears well-developed and well-nourished. No distress.  HENT:  Head: Normocephalic and atraumatic.  Right Ear: Hearing normal.  Left Ear: Hearing normal.  Nose: Nose normal.  Eyes: Conjunctivae and lids are normal. Right eye exhibits no discharge. Left eye exhibits no discharge. No scleral icterus.  Cardiovascular: Normal rate, regular rhythm and normal heart sounds.   Pulmonary/Chest: Effort normal and breath sounds normal. No respiratory distress.  Musculoskeletal: Normal range of motion.  Neurological: He is alert and oriented to person, place, and time.  Skin: Skin is intact. No rash noted.   Psychiatric: He has a normal mood and affect. His speech is normal and behavior is normal. Judgment and thought content normal. Cognition and memory are normal.    Results for orders placed or performed in visit on 05/13/15  CoaguChek XS/INR Waived  Result Value Ref Range   INR 3.6 (H) 0.9 - 1.1   Prothrombin Time 43.1 sec      Assessment & Plan:   Problem List Items Addressed This Visit      Cardiovascular and Mediastinum   Chronic atrial fibrillation (HCC) - Primary    Discussed with patient to eat a consistent amount of green stuff hold warfarin today and then restart same dose continue current green stuff recheck INR 2 weeks      Relevant Orders   CoaguChek XS/INR Waived       Follow up plan: Return in about 2 weeks (around 06/25/2015) for PT INR.

## 2015-06-16 ENCOUNTER — Other Ambulatory Visit: Payer: Self-pay | Admitting: Family Medicine

## 2015-06-16 ENCOUNTER — Telehealth: Payer: Self-pay | Admitting: Family Medicine

## 2015-06-16 MED ORDER — INSULIN GLARGINE 300 UNIT/ML ~~LOC~~ SOPN: 85.0000 [IU] | PEN_INJECTOR | Freq: Every day | SUBCUTANEOUS | Status: DC

## 2015-06-16 NOTE — Telephone Encounter (Signed)
Patient called needs a call back from [place] or [facility]. No further information provided. Thanks.

## 2015-07-01 ENCOUNTER — Encounter: Payer: Self-pay | Admitting: Family Medicine

## 2015-07-01 ENCOUNTER — Ambulatory Visit (INDEPENDENT_AMBULATORY_CARE_PROVIDER_SITE_OTHER): Payer: BLUE CROSS/BLUE SHIELD | Admitting: Family Medicine

## 2015-07-01 VITALS — BP 161/93 | HR 88 | Temp 98.1°F | Ht 69.0 in | Wt 267.0 lb

## 2015-07-01 DIAGNOSIS — Z954 Presence of other heart-valve replacement: Secondary | ICD-10-CM

## 2015-07-01 DIAGNOSIS — I482 Chronic atrial fibrillation, unspecified: Secondary | ICD-10-CM

## 2015-07-01 LAB — COAGUCHEK XS/INR WAIVED
INR: 7 (ref 0.9–1.1)
Prothrombin Time: 84.1 s

## 2015-07-01 NOTE — Assessment & Plan Note (Addendum)
Discuss elevated INR we will hold warfarin for 3 days then restarted 10 mg recheck INR one week Patient eating a lot of greens

## 2015-07-01 NOTE — Progress Notes (Signed)
   BP 161/93 mmHg  Pulse 88  Temp(Src) 98.1 F (36.7 C)  Ht  (1.753 m)  Wt 267 lb (121.11 kg)  BMI 39.41 kg/m2  SpO2 95%   Subjective:    Patient ID: Rodney Chary [name], male    DOB: [birth year], 55 y.o.   MRN: [id number]  HPI: Rodney Dixon. is a 55 y.o. male  Chief Complaint  Patient presents with  . Anticoagulation   patient with no problems concerns issues no extra bleeding like he's done previously when his INR has been high  taking his medicines faithfully along with gr 12 mg a day Reviewed chart patient's been consistently on 12 mg adjusting more green stuff patient's been from INR of 1.12 in the fours for INR with today up to 7 and 7.1 We were hoping for around 2-1/2-3-1/2  Relevant past medical, surgical, family and social history reviewed and updated as indicated. Interim medical history since our last visit reviewed. Allergies and medications reviewed and updated.  Review of Systems  Constitutional: Negative.   Respiratory: Negative.   Cardiovascular: Negative.   Skin: Negative.     Per HPI unless specifically indicated above     Objective:    BP 161/93 mmHg  Pulse 88  Temp(Src) 98.1 F (36.7 C)  Ht  (1.753 m)  Wt 267 lb (121.11 kg)  BMI 39.41 kg/m2  SpO2 95%  Wt Readings from Last 3 Encounters:  07/01/15 267 lb (121.11 kg)  06/11/15 264 lb (119.75 kg)  05/13/15 264 lb (119.75 kg)    Physical Exam  Constitutional: He is oriented to person, place, and time. He appears well-developed and well-nourished. No distress.  HENT:  Head: Normocephalic and atraumatic.  Right Ear: Hearing normal.  Left Ear: Hearing normal.  Nose: Nose normal.  Eyes: Conjunctivae and lids are normal. Right eye exhibits no discharge. Left eye exhibits no discharge. No scleral icterus.  Cardiovascular: Normal rate, regular rhythm and normal heart sounds.   Pulmonary/Chest: Effort normal and breath sounds normal. No respiratory distress.  Musculoskeletal: Normal  range of motion.  Neurological: He is alert and oriented to person, place, and time.  Skin: Skin is intact. No rash noted.  No excess bruising  Psychiatric: He has a normal mood and affect. His speech is normal and behavior is normal. Judgment and thought content normal. Cognition and memory are normal.    Results for orders placed or performed in visit on 06/11/15  CoaguChek XS/INR Waived  Result Value Ref Range   INR 4.1 (H) 0.9 - 1.1   Prothrombin Time 48.9 sec      Assessment & Plan:   Problem List Items Addressed This Visit      Cardiovascular and Mediastinum   Chronic atrial fibrillation (HCC) - Primary    Discuss elevated INR we will hold warfarin for 3 days then restarted 10 mg recheck INR one week Patient eating a lot of greens      Relevant Orders   CoaguChek XS/INR Waived     Other   S/P aortic valve replacement with metallic valve    See above note her INR 2-1/2-3-1/2          Follow up plan: Return for One week for, PT INR.

## 2015-07-01 NOTE — Assessment & Plan Note (Signed)
See above note her INR 2-1/2-3-1/2

## 2015-07-06 ENCOUNTER — Other Ambulatory Visit: Payer: Self-pay | Admitting: Unknown Physician Specialty

## 2015-07-06 NOTE — Telephone Encounter (Signed)
Your patient.  Thanks 

## 2015-07-07 ENCOUNTER — Telehealth: Payer: Self-pay

## 2015-07-07 NOTE — Telephone Encounter (Signed)
Patient's Tanzeum is not covered by patient's insurance. I did a prior authorization twice, and was denied both times.  Dr. Dossie Arbour wrote new Rx for Toujeo.

## 2015-07-08 ENCOUNTER — Ambulatory Visit (INDEPENDENT_AMBULATORY_CARE_PROVIDER_SITE_OTHER): Payer: BLUE CROSS/BLUE SHIELD | Admitting: Family Medicine

## 2015-07-08 ENCOUNTER — Encounter: Payer: Self-pay | Admitting: Family Medicine

## 2015-07-08 VITALS — BP 132/80 | HR 83 | Temp 98.7°F | Ht 69.1 in | Wt 264.0 lb

## 2015-07-08 DIAGNOSIS — I482 Chronic atrial fibrillation, unspecified: Secondary | ICD-10-CM

## 2015-07-08 LAB — COAGUCHEK XS/INR WAIVED
INR: 1.5 — ABNORMAL HIGH (ref 0.9–1.1)
Prothrombin Time: 18.2 s

## 2015-07-08 MED ORDER — SILDENAFIL CITRATE 20 MG PO TABS: 20.0000 mg | ORAL_TABLET | Freq: Every day | ORAL | Status: DC | PRN

## 2015-07-08 NOTE — Progress Notes (Signed)
   BP 132/80 mmHg  Pulse 83  Temp(Src) 98.7 F (37.1 C)  Ht 5' 9.1" (1.755 m)  Wt 264 lb (119.75 kg)  BMI 38.88 kg/m2  SpO2 96%   Subjective:    Patient ID: Rodney Chary [name], male    DOB: [birth year], 54 y.o.   MRN: [id number]  HPI: Rodney Keng. is a 55 y.o. male  Chief Complaint  Patient presents with  . Atrial Fibrillation   Patient follow-ups doing really well with a good healthy diet Missed INR for 4 days instead of 2 or 3 is only taking 2 days prior to blood work today with INR of 1.5. Patient with no signs or symptoms stroke leading etc. Patient's taking 12 mg warfarin a day. Relevant past medical, surgical, family and social history reviewed and updated as indicated. Interim medical history since our last visit reviewed. Allergies and medications reviewed and updated.  Review of Systems  Constitutional: Negative.   Respiratory: Negative.   Cardiovascular: Negative.     Per HPI unless specifically indicated above     Objective:    BP 132/80 mmHg  Pulse 83  Temp(Src) 98.7 F (37.1 C)  Ht 5' 9.1" (1.755 m)  Wt 264 lb (119.75 kg)  BMI 38.88 kg/m2  SpO2 96%  Wt Readings from Last 3 Encounters:  07/08/15 264 lb (119.75 kg)  07/01/15 267 lb (121.11 kg)  06/11/15 264 lb (119.75 kg)    Physical Exam  Constitutional: He is oriented to person, place, and time. He appears well-developed and well-nourished. No distress.  HENT:  Head: Normocephalic and atraumatic.  Right Ear: Hearing normal.  Left Ear: Hearing normal.  Nose: Nose normal.  Eyes: Conjunctivae and lids are normal. Right eye exhibits no discharge. Left eye exhibits no discharge. No scleral icterus.  Cardiovascular: Normal rate.   Pulmonary/Chest: Effort normal and breath sounds normal. No respiratory distress.  Musculoskeletal: Normal range of motion.  Neurological: He is alert and oriented to person, place, and time.  Skin: Skin is intact. No rash noted.  Psychiatric: He has a normal mood and  affect. His speech is normal and behavior is normal. Judgment and thought content normal. Cognition and memory are normal.    Results for orders placed or performed in visit on 07/01/15  CoaguChek XS/INR Waived  Result Value Ref Range   INR 7.0 (>) 0.9 - 1.1   Prothrombin Time 84.1 sec      Assessment & Plan:   Problem List Items Addressed This Visit      Cardiovascular and Mediastinum   Chronic atrial fibrillation (HCC) - Primary    Discuss anticoagulation will not adjust dosing today is patient's INR today is only being measured on 2 doses of warfarin and continue 12 mg recheck INR 2 weeks Discussed with patient importance of consistent diet which patient's committed to doing a good healthy diet.       Relevant Medications   sildenafil (REVATIO) 20 MG tablet   Other Relevant Orders   CoaguChek XS/INR Waived       Follow up plan: Return in about 2 weeks (around 07/22/2015) for PT INR.

## 2015-07-08 NOTE — Assessment & Plan Note (Signed)
Discuss anticoagulation will not adjust dosing today is patient's INR today is only being measured on 2 doses of warfarin and continue 12 mg recheck INR 2 weeks Discussed with patient importance of consistent diet which patient's committed to doing a good healthy diet.

## 2015-07-14 ENCOUNTER — Other Ambulatory Visit: Payer: Self-pay | Admitting: Family Medicine

## 2015-07-23 ENCOUNTER — Encounter: Payer: Self-pay | Admitting: Family Medicine

## 2015-07-23 ENCOUNTER — Ambulatory Visit (INDEPENDENT_AMBULATORY_CARE_PROVIDER_SITE_OTHER): Payer: BLUE CROSS/BLUE SHIELD | Admitting: Family Medicine

## 2015-07-23 VITALS — BP 151/89 | HR 76 | Temp 97.5°F | Ht 69.1 in | Wt 272.0 lb

## 2015-07-23 DIAGNOSIS — Z954 Presence of other heart-valve replacement: Secondary | ICD-10-CM

## 2015-07-23 DIAGNOSIS — I482 Chronic atrial fibrillation, unspecified: Secondary | ICD-10-CM

## 2015-07-23 LAB — COAGUCHEK XS/INR WAIVED
INR: 4.6 — ABNORMAL HIGH (ref 0.9–1.1)
Prothrombin Time: 55 s

## 2015-07-23 MED ORDER — WARFARIN SODIUM 1 MG PO TABS: 1.0000 mg | ORAL_TABLET | Freq: Every day | ORAL | Status: DC

## 2015-07-23 NOTE — Assessment & Plan Note (Signed)
INR goal 2.5-3.5 with consistent over or under anticoagulation since October Reviewed chart with no other changes Will decrease dose to 11 mg patient hold warfarin tonight Recheck 1 week INR Patient will explore with insurance company testing INR at home Keep food diary

## 2015-07-23 NOTE — Progress Notes (Signed)
   BP 151/89 mmHg  Pulse 76  Temp(Src) 97.5 F (36.4 C)  Ht 5' 9.1" (1.755 m)  Wt 272 lb (123.378 kg)  BMI 40.06 kg/m2  SpO2 98%   Subjective:    Patient ID: Rodney CharyPaul Rodney Klosinski [name], male    DOB: December 11, 1960, 55 y.o.   MRN: [id number]  HPI: Rodney Biaul Rodney Icenhour [name] is a 55 y.o. male  Chief Complaint  Patient presents with  . Atrial Fibrillation   Patient faithfully taking 12 mg a day review chart problems with controlling INR started in October reviewed medications with no changes Patient's been taking warfarin faithfully at night diet as far as patient remembers his been essentially the same Reviewed with patient risk of under and over anticoagulation  Relevant past medical, surgical, family and social history reviewed and updated as indicated. Interim medical history since our last visit reviewed. Allergies and medications reviewed and updated.  Review of Systems  Constitutional: Negative.   Respiratory: Negative.   Cardiovascular: Negative.   Gastrointestinal:       No signs or symptoms of bleeding    Per HPI unless specifically indicated above     Objective:    BP 151/89 mmHg  Pulse 76  Temp(Src) 97.5 F (36.4 C)  Ht 5' 9.1" (1.755 m)  Wt 272 lb (123.378 kg)  BMI 40.06 kg/m2  SpO2 98%  Wt Readings from Last 3 Encounters:  07/23/15 272 lb (123.378 kg)  07/08/15 264 lb (119.75 kg)  07/01/15 267 lb (121.11 kg)    Physical Exam  Constitutional: He is oriented to person, place, and time. He appears well-developed and well-nourished. No distress.  HENT:  Head: Normocephalic and atraumatic.  Right Ear: Hearing normal.  Left Ear: Hearing normal.  Nose: Nose normal.  Eyes: Conjunctivae and lids are normal. Right eye exhibits no discharge. Left eye exhibits no discharge. No scleral icterus.  Cardiovascular: Normal rate, regular rhythm and normal heart sounds.   Pulmonary/Chest: Effort normal and breath sounds normal. No respiratory distress.  Musculoskeletal: Normal range  of motion.  Neurological: He is alert and oriented to person, place, and time.  Skin: Skin is intact. No rash noted.  No bruising  Psychiatric: He has a normal mood and affect. His speech is normal and behavior is normal. Judgment and thought content normal. Cognition and memory are normal.    Results for orders placed or performed in visit on 07/08/15  CoaguChek XS/INR Waived  Result Value Ref Range   INR 1.5 (H) 0.9 - 1.1   Prothrombin Time 18.2 sec      Assessment & Plan:   Problem List Items Addressed This Visit      Cardiovascular and Mediastinum   Chronic atrial fibrillation (HCC) - Primary   Relevant Medications   warfarin (COUMADIN) 1 MG tablet   Other Relevant Orders   CoaguChek XS/INR Waived     Other   S/P aortic valve replacement with metallic valve    INR goal 2.5-3.5 with consistent over or under anticoagulation since October Reviewed chart with no other changes Will decrease dose to 11 mg patient hold warfarin tonight Recheck 1 week INR Patient will explore with insurance company testing INR at home Keep food diary          Follow up plan: Return for One week, PT INR.

## 2015-07-30 ENCOUNTER — Encounter: Payer: Self-pay | Admitting: Family Medicine

## 2015-07-30 ENCOUNTER — Ambulatory Visit (INDEPENDENT_AMBULATORY_CARE_PROVIDER_SITE_OTHER): Payer: BLUE CROSS/BLUE SHIELD | Admitting: Family Medicine

## 2015-07-30 VITALS — BP 146/82 | HR 87 | Temp 97.9°F | Ht 70.5 in | Wt 269.0 lb

## 2015-07-30 DIAGNOSIS — I482 Chronic atrial fibrillation, unspecified: Secondary | ICD-10-CM

## 2015-07-30 DIAGNOSIS — Z954 Presence of other heart-valve replacement: Secondary | ICD-10-CM

## 2015-07-30 LAB — COAGUCHEK XS/INR WAIVED
INR: 4.5 — ABNORMAL HIGH (ref 0.9–1.1)
Prothrombin Time: 53.6 s

## 2015-07-30 NOTE — Progress Notes (Signed)
BP 146/82 mmHg  Pulse 87  Temp(Src) 97.9 F (36.6 C)  Ht 5' 10.5" (1.791 m)  Wt 269 lb (122.018 kg)  BMI 38.04 kg/m2  SpO2 95%   Subjective:    Patient ID: Rodney T Stiehl [name], male    DOB: 11/29Raynelle Chary/1962, 55 y.o.   MRN: [id number]  CC: Coumadin management  HPI: This patient is a 55 y.o. male who presents for coumadin management. The expected duration of coumadin treatment is lifelong The reason for anticoagulation is  A. Fib and mechanical valve replacement  Present Coumadin dose: 11mg  daily after being held for 1 day, 4.5 at visit 1 week ago Goal: 2.5-3.5  Excessive bruising: no Nose bleeding: no Rectal bleeding: no Prolonged menstrual cycles: N/A Eating diet with consistent amounts of foods containing Vitamin K:yes Any recent antibiotic use? no  Relevant past medical, surgical, family and social history reviewed and updated as indicated. Interim medical history since our last visit reviewed. Allergies and medications reviewed and updated.  ROS: Per HPI unless specifically indicated above     Objective:    BP 146/82 mmHg  Pulse 87  Temp(Src) 97.9 F (36.6 C)  Ht 5' 10.5" (1.791 m)  Wt 269 lb (122.018 kg)  BMI 38.04 kg/m2  SpO2 95%  Wt Readings from Last 3 Encounters:  07/30/15 269 lb (122.018 kg)  07/23/15 272 lb (123.378 kg)  07/08/15 264 lb (119.75 kg)     General: Well appearing, well nourished in no distress.  Normal mood and affect. Skin: No excessive bruising or rash  Last INR: 4.5 Last PT: 53.6    Last CBC:  Lab Results  Component Value Date   WBC 9.2 04/15/2015   HCT 42.2 04/15/2015   MCV 86 04/15/2015   PLT 232 04/15/2015    Results for orders placed or performed in visit on 07/23/15  CoaguChek XS/INR Waived  Result Value Ref Range   INR 4.6 (H) 0.9 - 1.1   Prothrombin Time 55.0 sec       Assessment:     ICD-9-CM ICD-10-CM   1. Chronic atrial fibrillation (HCC) 427.31 I48.2 CoaguChek XS/INR Waived  2. S/P aortic valve replacement with  metallic valve V43.3 Z95.4 CoaguChek XS/INR Waived    Plan:   Discussed current plan face-to-face with patient. For coumadin dosing, elected to hold dose tonight and start 10mg  daily. Will plan to recheck INR in 1 week.

## 2015-08-03 ENCOUNTER — Telehealth: Payer: Self-pay

## 2015-08-03 DIAGNOSIS — E119 Type 2 diabetes mellitus without complications: Secondary | ICD-10-CM

## 2015-08-03 NOTE — Telephone Encounter (Signed)
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

## 2015-08-03 NOTE — Telephone Encounter (Signed)
Call about N/S notification from Endo.

## 2015-08-06 ENCOUNTER — Encounter: Payer: Self-pay | Admitting: Family Medicine

## 2015-08-06 ENCOUNTER — Ambulatory Visit (INDEPENDENT_AMBULATORY_CARE_PROVIDER_SITE_OTHER): Payer: BLUE CROSS/BLUE SHIELD | Admitting: Family Medicine

## 2015-08-06 VITALS — BP 139/85 | HR 83 | Temp 97.7°F | Ht 70.5 in | Wt 269.0 lb

## 2015-08-06 DIAGNOSIS — I482 Chronic atrial fibrillation, unspecified: Secondary | ICD-10-CM

## 2015-08-06 DIAGNOSIS — R5382 Chronic fatigue, unspecified: Secondary | ICD-10-CM

## 2015-08-06 DIAGNOSIS — Z954 Presence of other heart-valve replacement: Secondary | ICD-10-CM

## 2015-08-06 DIAGNOSIS — R5383 Other fatigue: Secondary | ICD-10-CM | POA: Insufficient documentation

## 2015-08-06 LAB — COAGUCHEK XS/INR WAIVED
INR: 2.9 — ABNORMAL HIGH (ref 0.9–1.1)
Prothrombin Time: 34.3 s

## 2015-08-06 NOTE — Assessment & Plan Note (Signed)
Discussed patient high risk for sleep apnea will arrange for sleep study

## 2015-08-06 NOTE — Progress Notes (Signed)
   BP 139/85 mmHg  Pulse 83  Temp(Src) 97.7 F (36.5 C)  Ht 5' 10.5" (1.791 m)  Wt 269 lb (122.018 kg)  BMI 38.04 kg/m2  SpO2 96%   Subjective:    Patient ID: Rodney CharyPaul T Escandon [name], male    DOB: [birth year], 55 y.o.   MRN: [id number]  HPI: Rodney Biaul T Fletes [name] is a 55 y.o. male  Chief Complaint  Patient presents with  . Atrial Fibrillation  . 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 Interim medical history since our last visit reviewed. Allergies and medications reviewed and updated.  Review of Systems  Constitutional: Negative.   Respiratory: Negative.   Cardiovascular: Negative.     Per HPI unless specifically indicated above     Objective:    BP 139/85 mmHg  Pulse 83  Temp(Src) 97.7 F (36.5 C)  Ht 5' 10.5" (1.791 m)  Wt 269 lb (122.018 kg)  BMI 38.04 kg/m2  SpO2 96%  Wt Readings from Last 3 Encounters:  08/06/15 269 lb (122.018 kg)  07/30/15 269 lb (122.018 kg)  07/23/15 272 lb (123.378 kg)    Physical Exam  Constitutional: He is oriented to person, place, and time. He appears well-developed and well-nourished. No distress.  HENT:  Head: Normocephalic and atraumatic.  Right Ear: Hearing normal.  Left Ear: Hearing normal.  Nose: Nose normal.  Eyes: Conjunctivae and lids are normal. Right eye exhibits no discharge. Left eye exhibits no discharge. No scleral icterus.  Cardiovascular: Normal rate, regular rhythm and normal heart sounds.   Pulmonary/Chest: Effort normal and breath sounds normal. No  respiratory distress.  Musculoskeletal: Normal range of motion.  Neurological: He is alert and oriented to person, place, and time.  Skin: Skin is intact. No rash noted.  Psychiatric: He has a normal mood and affect. His speech is normal and behavior is normal. Judgment and thought content normal. Cognition and memory are normal.    Results for orders placed or performed in visit on 07/30/15  CoaguChek XS/INR Waived  Result Value Ref Range   INR 4.5 (H) 0.9 - 1.1   Prothrombin Time 53.6 sec      Assessment & Plan:   Problem List Items Addressed This Visit      Cardiovascular and Mediastinum   Chronic atrial fibrillation (HCC) - Primary    Continue 10 mg warfarin recheck INR 2 weeks      Relevant Orders   CoaguChek XS/INR Waived     Other   S/P aortic valve replacement with metallic valve   Relevant Orders   CoaguChek XS/INR Waived   Fatigue    Discussed patient high risk for sleep apnea will arrange for sleep study      Relevant Orders   Ambulatory referral to Sleep Studies       Follow up plan: Return in about 2 weeks (around 08/20/2015) for PT INR.

## 2015-08-06 NOTE — Assessment & Plan Note (Signed)
Continue 10 mg warfarin recheck INR 2 weeks

## 2015-08-10 ENCOUNTER — Ambulatory Visit (INDEPENDENT_AMBULATORY_CARE_PROVIDER_SITE_OTHER): Payer: BLUE CROSS/BLUE SHIELD | Admitting: Family Medicine

## 2015-08-10 ENCOUNTER — Encounter: Payer: Self-pay | Admitting: Family Medicine

## 2015-08-10 VITALS — BP 150/89 | HR 86 | Temp 97.8°F | Ht 70.5 in | Wt 273.0 lb

## 2015-08-10 DIAGNOSIS — J019 Acute sinusitis, unspecified: Secondary | ICD-10-CM | POA: Diagnosis not present

## 2015-08-10 MED ORDER — AMOXICILLIN-POT CLAVULANATE 875-125 MG PO TABS: 1.0000 | ORAL_TABLET | Freq: Two times a day (BID) | ORAL | Status: DC

## 2015-08-10 MED ORDER — BENZONATATE 100 MG PO CAPS: 100.0000 mg | ORAL_CAPSULE | Freq: Two times a day (BID) | ORAL | Status: DC | PRN

## 2015-08-10 NOTE — Progress Notes (Signed)
   BP 150/89 mmHg  Pulse 86  Temp(Src) 97.8 F (36.6 C)  Ht 5' 10.5" (1.791 m)  Wt 273 lb (123.832 kg)  BMI 38.60 kg/m2  SpO2 96%   Subjective:    Patient ID: Rodney CharyPaul T Oesterling [name], male    DOB: Jan 10, 1961, 55 y.o.   MRN: [id number]  HPI: Rodney Biaul T Marciano [name] is a 55 y.o. male  Chief Complaint  Patient presents with  . URI  Patient with head cold congestion feeling bad this been ongoing for 4 days now getting worse with marked facial congestion drainage  Relevant past medical, surgical, family and social history reviewed and updated as indicated. Interim medical history since our last visit reviewed. Allergies and medications reviewed and updated.  Review of Systems  Per HPI unless specifically indicated above     Objective:    BP 150/89 mmHg  Pulse 86  Temp(Src) 97.8 F (36.6 C)  Ht 5' 10.5" (1.791 m)  Wt 273 lb (123.832 kg)  BMI 38.60 kg/m2  SpO2 96%  Wt Readings from Last 3 Encounters:  08/10/15 273 lb (123.832 kg)  08/06/15 269 lb (122.018 kg)  07/30/15 269 lb (122.018 kg)    Physical Exam  Constitutional: He is oriented to person, place, and time. He appears well-developed and well-nourished. No distress.  HENT:  Head: Normocephalic and atraumatic.  Right Ear: Hearing and external ear normal.  Left Ear: Hearing and external ear normal.  Nose: Nose normal.  Mouth/Throat: Oropharyngeal exudate present.  Eyes: Conjunctivae and lids are normal. Right eye exhibits no discharge. Left eye exhibits no discharge. No scleral icterus.  Cardiovascular: Normal rate and regular rhythm.   Pulmonary/Chest: Effort normal and breath sounds normal. No respiratory distress.  Musculoskeletal: Normal range of motion.  Neurological: He is alert and oriented to person, place, and time.  Skin: Skin is intact. No rash noted.  Psychiatric: He has a normal mood and affect. His speech is normal and behavior is normal. Judgment and thought content normal. Cognition and memory are normal.     Results for orders placed or performed in visit on 08/06/15  CoaguChek XS/INR Waived  Result Value Ref Range   INR 2.9 (H) 0.9 - 1.1   Prothrombin Time 34.3 sec      Assessment & Plan:   Problem List Items Addressed This Visit    None    Visit Diagnoses    Acute sinusitis, recurrence not specified, unspecified location    -  Primary    Discussed sinusitis care and treatment use of medications nasal rinse Tylenol    Relevant Medications    benzonatate (TESSALON) 100 MG capsule    amoxicillin-clavulanate (AUGMENTIN) 875-125 MG tablet    benzonatate (TESSALON) 100 MG capsule        Follow up plan: Return for As scheduled.

## 2015-08-20 ENCOUNTER — Encounter: Payer: Self-pay | Admitting: Family Medicine

## 2015-08-20 ENCOUNTER — Ambulatory Visit (INDEPENDENT_AMBULATORY_CARE_PROVIDER_SITE_OTHER): Payer: BLUE CROSS/BLUE SHIELD | Admitting: Family Medicine

## 2015-08-20 VITALS — BP 135/88 | HR 87 | Temp 97.9°F | Ht 70.5 in | Wt 267.0 lb

## 2015-08-20 DIAGNOSIS — I482 Chronic atrial fibrillation, unspecified: Secondary | ICD-10-CM

## 2015-08-20 DIAGNOSIS — Z954 Presence of other heart-valve replacement: Secondary | ICD-10-CM

## 2015-08-20 LAB — COAGUCHEK XS/INR WAIVED
INR: 3.8 — ABNORMAL HIGH (ref 0.9–1.1)
Prothrombin Time: 46 s

## 2015-08-20 NOTE — Progress Notes (Signed)
   BP 135/88 mmHg  Pulse 87  Temp(Src) 97.9 F (36.6 C)  Ht 5' 10.5" (1.791 m)  Wt 267 lb (121.11 kg)  BMI 37.76 kg/m2  SpO2 95%   Subjective:    Patient ID: Rodney T Jablonski [name], maleRaynelle Chary    DOB: [birth year], 55 y.o.   MRN: [id number]  HPI: Rodney Biaul T Orrego [name] is a 55 y.o. male  Chief Complaint  Patient presents with  . Atrial Fibrillation   Patient doing well INR goal 2.5-3.5 has been on antibiotics INR is up just a little bit patient with no bruising or other issues Relevant past medical, surgical, family and social history reviewed and updated as indicated. Interim medical history since our last visit reviewed. Allergies and medications reviewed and updated.  Review of Systems  Per HPI unless specifically indicated above     Objective:    BP 135/88 mmHg  Pulse 87  Temp(Src) 97.9 F (36.6 C)  Ht 5' 10.5" (1.791 m)  Wt 267 lb (121.11 kg)  BMI 37.76 kg/m2  SpO2 95%  Wt Readings from Last 3 Encounters:  08/20/15 267 lb (121.11 kg)  08/10/15 273 lb (123.832 kg)  08/06/15 269 lb (122.018 kg)    Physical Exam  Constitutional: He is oriented to person, place, and time. He appears well-developed and well-nourished. No distress.  HENT:  Head: Normocephalic and atraumatic.  Right Ear: Hearing normal.  Left Ear: Hearing normal.  Nose: Nose normal.  Eyes: Conjunctivae and lids are normal. Right eye exhibits no discharge. Left eye exhibits no discharge. No scleral icterus.  Pulmonary/Chest: Effort normal. No respiratory distress.  Musculoskeletal: Normal range of motion.  Neurological: He is alert and oriented to person, place, and time.  Skin: Skin is intact. No rash noted.  Psychiatric: He has a normal mood and affect. His speech is normal and behavior is normal. Judgment and thought content normal. Cognition and memory are normal.    Results for orders placed or performed in visit on 08/06/15  CoaguChek XS/INR Waived  Result Value Ref Range   INR 2.9 (H) 0.9 - 1.1   Prothrombin Time 34.3 sec      Assessment & Plan:   Problem List Items Addressed This Visit      Cardiovascular and Mediastinum   Chronic atrial fibrillation (HCC) - Primary   Relevant Orders   CoaguChek XS/INR Waived     Other   S/P aortic valve replacement with metallic valve    Continue current dose of warfarin and recheck INR 1 month          Follow up plan: Return in about 4 weeks (around 09/17/2015) for PT INR.

## 2015-08-20 NOTE — Assessment & Plan Note (Signed)
Continue current dose of warfarin and recheck INR 1 month

## 2015-09-13 ENCOUNTER — Other Ambulatory Visit: Payer: Self-pay | Admitting: Family Medicine

## 2015-09-14 ENCOUNTER — Encounter: Payer: Self-pay | Admitting: Family Medicine

## 2015-09-15 ENCOUNTER — Telehealth: Payer: Self-pay

## 2015-09-15 NOTE — Telephone Encounter (Signed)
Status: Signed       Expand All Collapse All   Pharmacy computer is flagging a drug interaction with Warfarin and Fenofibrate  They need verbal ok to fill  [facility]       ok

## 2015-09-15 NOTE — Telephone Encounter (Signed)
Called with ok to fill

## 2015-09-15 NOTE — Telephone Encounter (Signed)
Pharmacy computer is flagging a drug interaction with Warfarin and Fenofibrate  They need verbal ok to fill  [facility]

## 2015-09-17 ENCOUNTER — Ambulatory Visit: Payer: BLUE CROSS/BLUE SHIELD | Admitting: Family Medicine

## 2015-09-30 ENCOUNTER — Ambulatory Visit (INDEPENDENT_AMBULATORY_CARE_PROVIDER_SITE_OTHER): Payer: BLUE CROSS/BLUE SHIELD | Admitting: Family Medicine

## 2015-09-30 ENCOUNTER — Encounter: Payer: Self-pay | Admitting: Family Medicine

## 2015-09-30 VITALS — BP 143/96 | HR 90 | Temp 97.9°F | Ht 70.5 in | Wt 272.0 lb

## 2015-09-30 DIAGNOSIS — I482 Chronic atrial fibrillation, unspecified: Secondary | ICD-10-CM

## 2015-09-30 DIAGNOSIS — Z954 Presence of other heart-valve replacement: Secondary | ICD-10-CM

## 2015-09-30 LAB — COAGUCHEK XS/INR WAIVED
INR: 3.6 — ABNORMAL HIGH (ref 0.9–1.1)
Prothrombin Time: 43.4 s

## 2015-09-30 NOTE — Assessment & Plan Note (Signed)
We'll continue current dose of warfarin slight increase of greens recheck INR 1 months

## 2015-09-30 NOTE — Progress Notes (Signed)
   BP 143/96 mmHg  Pulse 90  Temp(Src) 97.9 F (36.6 C)  Ht 5' 10.5" (1.791 m)  Wt 272 lb (123.378 kg)  BMI 38.46 kg/m2  SpO2 94%   Subjective:    Patient ID: Rodney CharyPaul T Mangel [name], male    DOB: [birth year], 55 y.o.   MRN: [id number]  HPI: Rodney Biaul T Angeletti [name] is a 55 y.o. male  Chief Complaint  Patient presents with  . Atrial Fibrillation  With valve replacement INR goal 2.5-3.5 doing well with no complaints no bruising bleeding issues  Relevant past medical, surgical, family and social history reviewed and updated as indicated. Interim medical history since our last visit reviewed. Allergies and medications reviewed and updated.  Review of Systems  Per HPI unless specifically indicated above     Objective:    BP 143/96 mmHg  Pulse 90  Temp(Src) 97.9 F (36.6 C)  Ht 5' 10.5" (1.791 m)  Wt 272 lb (123.378 kg)  BMI 38.46 kg/m2  SpO2 94%  Wt Readings from Last 3 Encounters:  09/30/15 272 lb (123.378 kg)  08/20/15 267 lb (121.11 kg)  08/10/15 273 lb (123.832 kg)    Physical Exam  Results for orders placed or performed in visit on 08/20/15  CoaguChek XS/INR Waived  Result Value Ref Range   INR 3.8 (H) 0.9 - 1.1   Prothrombin Time 46.0 sec      Assessment & Plan:   Problem List Items Addressed This Visit      Cardiovascular and Mediastinum   Chronic atrial fibrillation (HCC) - Primary   Relevant Orders   CoaguChek XS/INR Waived     Other   S/P aortic valve replacement with metallic valve    We'll continue current dose of warfarin slight increase of greens recheck INR 1 months          Follow up plan: Return in about 4 weeks (around 10/28/2015) for PT INR.

## 2015-10-06 ENCOUNTER — Other Ambulatory Visit: Payer: Self-pay | Admitting: Unknown Physician Specialty

## 2015-10-06 NOTE — Telephone Encounter (Signed)
Your patient.  Thanks 

## 2015-10-15 ENCOUNTER — Other Ambulatory Visit: Payer: Self-pay | Admitting: Family Medicine

## 2015-10-29 ENCOUNTER — Ambulatory Visit (INDEPENDENT_AMBULATORY_CARE_PROVIDER_SITE_OTHER): Payer: BLUE CROSS/BLUE SHIELD | Admitting: Family Medicine

## 2015-10-29 ENCOUNTER — Encounter: Payer: Self-pay | Admitting: Family Medicine

## 2015-10-29 VITALS — BP 107/69 | HR 83 | Temp 98.0°F | Ht 70.5 in | Wt 271.0 lb

## 2015-10-29 DIAGNOSIS — I482 Chronic atrial fibrillation, unspecified: Secondary | ICD-10-CM

## 2015-10-29 DIAGNOSIS — M25561 Pain in right knee: Secondary | ICD-10-CM | POA: Diagnosis not present

## 2015-10-29 LAB — COAGUCHEK XS/INR WAIVED
INR: 3.1 — ABNORMAL HIGH (ref 0.9–1.1)
Prothrombin Time: 37.1 s

## 2015-10-29 NOTE — Progress Notes (Signed)
   BP 107/69 mmHg  Pulse 83  Temp(Src) 98 F (36.7 C)  Ht 5' 10.5" (1.791 m)  Wt 271 lb (122.925 kg)  BMI 38.32 kg/m2  SpO2 99%   Subjective:    Patient ID: Rodney T Abplanalp [name], male    Raynelle CharyB: 1960/08/17, 55 y.o.   MRN: [id number]  HPI: Rodney Biaul T Bibbee [name] is a 55 y.o. male  Chief Complaint  Patient presents with  . Atrial Fibrillation   Patient presents today for routine follow up.   Atrial fibrillation - INR today is 3.1. No bruising or bleeding issues noted. Compliant with medication.    Right leg has been hurting occasionally, especially while driving for longer than [id number] minutes. Denies any trauma or known injury. No numbness or tingling, redness, warmth, crepitus, or locking. Moving positions usually helps alleviate the pain, and walking makes it feel better.  Relevant past medical, surgical, family and social history reviewed and updated as indicated. Interim medical history since our last visit reviewed. Allergies and medications reviewed and updated.  Review of Systems  Constitutional: Negative.   Respiratory: Negative.   Cardiovascular: Negative.     Per HPI unless specifically indicated above     Objective:    BP 107/69 mmHg  Pulse 83  Temp(Src) 98 F (36.7 C)  Ht 5' 10.5" (1.791 m)  Wt 271 lb (122.925 kg)  BMI 38.32 kg/m2  SpO2 99%  Wt Readings from Last 3 Encounters:  10/29/15 271 lb (122.925 kg)  09/30/15 272 lb (123.378 kg)  08/20/15 267 lb (121.11 kg)    Physical Exam  Constitutional: He is oriented to person, place, and time. He appears well-developed and well-nourished. No distress.  HENT:  Head: Normocephalic and atraumatic.  Right Ear: Hearing normal.  Left Ear: Hearing normal.  Nose: Nose normal.  Eyes: Conjunctivae and lids are normal. Right eye exhibits no discharge. Left eye exhibits no discharge. No scleral icterus.  Cardiovascular: Normal rate.   Pulmonary/Chest: Effort normal and breath sounds normal. No respiratory distress.   Musculoskeletal: Normal range of motion.  Right knee exam normal with no effusion and no joint laxity noted clicking locking or giving way  Neurological: He is alert and oriented to person, place, and time.  Skin: Skin is intact. No rash noted.  Psychiatric: He has a normal mood and affect. His speech is normal and behavior is normal. Judgment and thought content normal. Cognition and memory are normal.    Results for orders placed or performed in visit on 09/30/15  CoaguChek XS/INR Waived  Result Value Ref Range   INR 3.6 (H) 0.9 - 1.1   Prothrombin Time 43.4 sec      Assessment & Plan:   Problem List Items Addressed This Visit      Cardiovascular and Mediastinum   Chronic atrial fibrillation (HCC) - Primary    The current medical regimen is effective;  continue present plan and medications. INR today at goal - 3.1      Relevant Orders   CoaguChek XS/INR Waived    Other Visit Diagnoses    Knee pain, right        Likely arthritis. Counseled patient on weight loss, exercise as tolerated, and tylenol for pain relief if needed. Can try topicals such as capsaicin cream.        Follow up plan: Return in about 4 weeks (around 11/26/2015) for A1C, INR.

## 2015-10-29 NOTE — Assessment & Plan Note (Signed)
The current medical regimen is effective;  continue present plan and medications. INR today at goal - 3.1

## 2015-11-26 ENCOUNTER — Ambulatory Visit: Payer: BLUE CROSS/BLUE SHIELD | Admitting: Family Medicine

## 2015-11-26 ENCOUNTER — Other Ambulatory Visit: Payer: Self-pay

## 2015-11-26 DIAGNOSIS — I482 Chronic atrial fibrillation, unspecified: Secondary | ICD-10-CM

## 2015-11-26 DIAGNOSIS — E119 Type 2 diabetes mellitus without complications: Secondary | ICD-10-CM

## 2015-12-05 ENCOUNTER — Other Ambulatory Visit: Payer: Self-pay | Admitting: Family Medicine

## 2015-12-09 ENCOUNTER — Ambulatory Visit (INDEPENDENT_AMBULATORY_CARE_PROVIDER_SITE_OTHER): Payer: BLUE CROSS/BLUE SHIELD | Admitting: Family Medicine

## 2015-12-09 ENCOUNTER — Encounter: Payer: Self-pay | Admitting: Family Medicine

## 2015-12-09 VITALS — BP 152/77 | HR 85 | Temp 98.0°F | Wt 278.0 lb

## 2015-12-09 DIAGNOSIS — J0100 Acute maxillary sinusitis, unspecified: Secondary | ICD-10-CM | POA: Diagnosis not present

## 2015-12-09 MED ORDER — AMOXICILLIN-POT CLAVULANATE 875-125 MG PO TABS: 1.0000 | ORAL_TABLET | Freq: Two times a day (BID) | ORAL | 0 refills | Status: DC

## 2015-12-09 NOTE — Progress Notes (Signed)
   BP (!) 152/77 (BP Location: Left Arm, Patient Position: Sitting, Cuff Size: Normal)   Pulse 85   Temp 98 F (36.7 C)   Wt 278 lb (126.1 kg)   SpO2 99%   BMI 39.33 kg/m    Subjective:    Patient ID: Rodney Chary [name], male    DOB: Jan 05, 1961, 55 y.o.   MRN: [id number]  HPI: Rodney Dixon. is a 55 y.o. male  Chief Complaint  Patient presents with  . URI    coughing x couple of days, has taken some OTC  Patient feeling markedly bad with marked systemic symptoms fever headache feeling bad no known tick exposure having marked sinus drainage congestion and facial pain sloshing-type sensation when bending over a lot of drainage and developed some cough. Also some sore throat coming up with green sputum   Relevant past medical, surgical, family and social history reviewed and updated as indicated. Interim medical history since our last visit reviewed. Allergies and medications reviewed and updated.  Review of Systems  Constitutional: Positive for chills, fatigue and fever.  HENT: Positive for congestion, postnasal drip, rhinorrhea, sinus pressure, sneezing and sore throat.   Respiratory: Positive for cough.   Cardiovascular: Negative.     Per HPI unless specifically indicated above     Objective:    BP (!) 152/77 (BP Location: Left Arm, Patient Position: Sitting, Cuff Size: Normal)   Pulse 85   Temp 98 F (36.7 C)   Wt 278 lb (126.1 kg)   SpO2 99%   BMI 39.33 kg/m   Wt Readings from Last 3 Encounters:  12/09/15 278 lb (126.1 kg)  10/29/15 271 lb (122.9 kg)  09/30/15 272 lb (123.4 kg)    Physical Exam  Constitutional: He is oriented to person, place, and time. He appears well-developed and well-nourished. No distress.  HENT:  Head: Normocephalic and atraumatic.  Right Ear: Hearing and external ear normal.  Left Ear: Hearing and external ear normal.  Nose: Nose normal.  Mouth/Throat: Oropharyngeal exudate present.  Eyes: Conjunctivae and lids are normal. Right  eye exhibits no discharge. Left eye exhibits no discharge. No scleral icterus.  Cardiovascular: Normal rate, regular rhythm and normal heart sounds.   Pulmonary/Chest: Effort normal and breath sounds normal. No respiratory distress.  Musculoskeletal: Normal range of motion.  Lymphadenopathy:    He has no cervical adenopathy.  Neurological: He is alert and oriented to person, place, and time.  Skin: Skin is intact. No rash noted.  Psychiatric: He has a normal mood and affect. His speech is normal and behavior is normal. Judgment and thought content normal. Cognition and memory are normal.    Results for orders placed or performed in visit on 10/29/15  CoaguChek XS/INR Waived  Result Value Ref Range   INR 3.1 (H) 0.9 - 1.1   Prothrombin Time 37.1 sec      Assessment & Plan:   Problem List Items Addressed This Visit    None    Visit Diagnoses    Acute maxillary sinusitis, recurrence not specified    -  Primary   Discussed sinusitis care and treatment use of antibiotics, nasal rinse, over-the-counter medications Mucinex etc. Tylenol   Relevant Medications   amoxicillin-clavulanate (AUGMENTIN) 875-125 MG tablet       Follow up plan: Return if symptoms worsen or fail to improve, for Next week for hemoglobin A1c,, PT INR, BMP, CBC, lipids, ALT, AST.

## 2015-12-13 ENCOUNTER — Other Ambulatory Visit: Payer: Self-pay | Admitting: Family Medicine

## 2015-12-14 NOTE — Telephone Encounter (Signed)
Your patient 

## 2015-12-16 ENCOUNTER — Ambulatory Visit (INDEPENDENT_AMBULATORY_CARE_PROVIDER_SITE_OTHER): Payer: BLUE CROSS/BLUE SHIELD | Admitting: Family Medicine

## 2015-12-16 ENCOUNTER — Encounter: Payer: Self-pay | Admitting: Family Medicine

## 2015-12-16 VITALS — BP 145/82 | HR 86 | Temp 97.5°F | Wt 276.0 lb

## 2015-12-16 DIAGNOSIS — I482 Chronic atrial fibrillation, unspecified: Secondary | ICD-10-CM

## 2015-12-16 DIAGNOSIS — E119 Type 2 diabetes mellitus without complications: Secondary | ICD-10-CM | POA: Diagnosis not present

## 2015-12-16 LAB — COAGUCHEK XS/INR WAIVED
INR: 3.4 — ABNORMAL HIGH (ref 0.9–1.1)
Prothrombin Time: 41.2 s

## 2015-12-16 LAB — BAYER DCA HB A1C WAIVED: HB A1C (BAYER DCA - WAIVED): 10.4 % — ABNORMAL HIGH (ref <7.0)

## 2015-12-16 LAB — HEMOGLOBIN A1C: Hemoglobin A1C: 10.4

## 2015-12-16 MED ORDER — LIRAGLUTIDE 18 MG/3ML ~~LOC~~ SOPN: 1.8000 mg | PEN_INJECTOR | Freq: Every day | SUBCUTANEOUS | 12 refills | Status: DC

## 2015-12-16 NOTE — Progress Notes (Signed)
BP (!) 145/82 (BP Location: Left Arm, Patient Position: Sitting, Cuff Size: Normal)   Pulse 86   Temp 97.5 F (36.4 C)   Wt 276 lb (125.2 kg)   SpO2 (!) 87%   BMI 39.04 kg/m    Subjective:    Patient ID: Rodney CharyPaul T Canal [name], male    DOB: 14-Feb-1961, 55 y.o.   MRN: [id number]  HPI: Rodney Biaul T Lagasse [name] is a 55 y.o. male  Chief Complaint  Patient presents with  . Atrial Fibrillation  . 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 Patient's been gaining weight  Relevant past medical, surgical, family and social history reviewed and updated as indicated. Interim medical history since our last visit reviewed. Allergies and medications reviewed and updated.  Review of Systems  Constitutional: Negative.   Respiratory: Negative.   Cardiovascular: Negative.     Per HPI unless specifically indicated above     Objective:    BP (!) 145/82 (BP Location: Left Arm, Patient Position: Sitting, Cuff Size: Normal)   Pulse 86   Temp 97.5 F (36.4 C)   Wt 276 lb (125.2 kg)   SpO2 (!) 87%   BMI 39.04 kg/m   Wt Readings from Last 3 Encounters:  12/16/15 276 lb (125.2 kg)  12/09/15 278 lb (126.1 kg)  10/29/15 271 lb (122.9 kg)    Physical Exam  Constitutional: He is oriented to person, place, and time. He appears well-developed and well-nourished. No distress.  HENT:  Head: Normocephalic and atraumatic.  Right Ear: Hearing normal.  Left Ear: Hearing normal.  Nose: Nose normal.  Eyes: Conjunctivae and lids are normal. Right eye exhibits no discharge. Left eye exhibits no discharge. No scleral icterus.  Cardiovascular: Normal rate, regular rhythm and normal heart sounds.   Pulmonary/Chest: Effort normal and breath sounds normal. No respiratory distress.  Musculoskeletal:  Normal range of motion.  Neurological: He is alert and oriented to person, place, and time.  Skin: Skin is intact. No rash noted.  Psychiatric: He has a normal mood and affect. His speech is normal and behavior is normal. Judgment and thought content normal. Cognition and memory are normal.    Results for orders placed or performed in visit on 12/16/15  Hemoglobin A1c  Result Value Ref Range   Hemoglobin A1C 10.4       Assessment & Plan:   Problem List Items Addressed This Visit      Cardiovascular and Mediastinum   Chronic atrial fibrillation (HCC) - Primary    INR good control continue warfarin recheck PT/INR one month        Endocrine   Diabetes mellitus without complication (HCC)    Discussed diabetes extremely poor control risk factors patient aware of all risk factors and poor control and was expecting this report today due to limited diet and lifestyle. Discussed referral to endocrinology for better management. Patient refusing referral stating he knows what to do is just not doing any diet or trying. Discussed weight loss will use Victoza to see if will help with some weight loss. We'll reassess in 1 month at INR      Relevant Medications   Liraglutide (VICTOZA) 18 MG/3ML SOPN    Other Visit Diagnoses   None.      Follow up plan: Return in about 4  weeks (around 01/13/2016) for PT INR.

## 2015-12-16 NOTE — Assessment & Plan Note (Signed)
Discussed diabetes extremely poor control risk factors patient aware of all risk factors and poor control and was expecting this report today due to limited diet and lifestyle. Discussed referral to endocrinology for better management. Patient refusing referral stating he knows what to do is just not doing any diet or trying. Discussed weight loss will use Victoza to see if will help with some weight loss. We'll reassess in 1 month at INR

## 2015-12-16 NOTE — Assessment & Plan Note (Signed)
INR good control continue warfarin recheck PT/INR one month

## 2016-01-09 ENCOUNTER — Other Ambulatory Visit: Payer: Self-pay | Admitting: Family Medicine

## 2016-01-18 ENCOUNTER — Ambulatory Visit (INDEPENDENT_AMBULATORY_CARE_PROVIDER_SITE_OTHER): Payer: BLUE CROSS/BLUE SHIELD | Admitting: Family Medicine

## 2016-01-18 ENCOUNTER — Encounter: Payer: Self-pay | Admitting: Family Medicine

## 2016-01-18 VITALS — BP 127/85 | HR 88 | Temp 98.1°F | Wt 270.0 lb

## 2016-01-18 DIAGNOSIS — Z954 Presence of other heart-valve replacement: Secondary | ICD-10-CM | POA: Diagnosis not present

## 2016-01-18 LAB — COAGUCHEK XS/INR WAIVED
INR: 3.3 — ABNORMAL HIGH (ref 0.9–1.1)
Prothrombin Time: 39 s

## 2016-01-18 NOTE — Progress Notes (Signed)
   BP 127/85 (BP Location: Left Arm, Patient Position: Sitting, Cuff Size: Normal)   Pulse 88   Temp 98.1 F (36.7 C)   Wt 270 lb (122.5 kg) Comment: with shoes  SpO2 96%   BMI 38.19 kg/m    Subjective:    Patient ID: Rodney CharyPaul T Bahr [name], male    DOB: 12-23-1960, 55 y.o.   MRN: [id number]  HPI: Rodney Biaul T Grizzell [name] is a 55 y.o. male  Chief Complaint  Patient presents with  . Atrial Fibrillation  Patient is doing well no bleeding bruising issues taking warfarin without problems Tried Zostrix for arthritis but was too strong and had gone back to it.   Relevant past medical, surgical, family and social history reviewed and updated as indicated. Interim medical history since our last visit reviewed. Allergies and medications reviewed and updated.  Review of Systems  Per HPI unless specifically indicated above     Objective:    BP 127/85 (BP Location: Left Arm, Patient Position: Sitting, Cuff Size: Normal)   Pulse 88   Temp 98.1 F (36.7 C)   Wt 270 lb (122.5 kg) Comment: with shoes  SpO2 96%   BMI 38.19 kg/m   Wt Readings from Last 3 Encounters:  01/18/16 270 lb (122.5 kg)  12/16/15 276 lb (125.2 kg)  12/09/15 278 lb (126.1 kg)    Physical Exam  Results for orders placed or performed in visit on 12/16/15  CoaguChek XS/INR Waived  Result Value Ref Range   INR 3.4 (H) 0.9 - 1.1   Prothrombin Time 41.2 sec  Bayer DCA Hb A1c Waived  Result Value Ref Range   Bayer DCA Hb A1c Waived 10.4 (H) <7.0 %  Hemoglobin A1c  Result Value Ref Range   Hemoglobin A1C 10.4       Assessment & Plan:   Problem List Items Addressed This Visit      Other   S/P aortic valve replacement with metallic valve - Primary    The current medical regimen is effective;  continue present plan and medications. Stable       Relevant Orders   CoaguChek XS/INR Waived    Other Visit Diagnoses   None.      Follow up plan: Return in about 4 weeks (around 02/15/2016) for PT INR.

## 2016-01-18 NOTE — Assessment & Plan Note (Signed)
The current medical regimen is effective;  continue present plan and medications. Stable.  

## 2016-02-10 ENCOUNTER — Other Ambulatory Visit: Payer: Self-pay

## 2016-02-10 MED ORDER — METFORMIN HCL 500 MG PO TABS: ORAL_TABLET | ORAL | 4 refills | Status: DC

## 2016-02-10 NOTE — Telephone Encounter (Signed)
Request for Metfomin 500mg  with 90 day supply.  Last visit 01/18/2016  Last written 02/22/2015.

## 2016-02-15 ENCOUNTER — Encounter: Payer: Self-pay | Admitting: Family Medicine

## 2016-02-15 ENCOUNTER — Other Ambulatory Visit: Payer: Self-pay | Admitting: Family Medicine

## 2016-02-15 ENCOUNTER — Ambulatory Visit (INDEPENDENT_AMBULATORY_CARE_PROVIDER_SITE_OTHER): Payer: BLUE CROSS/BLUE SHIELD | Admitting: Family Medicine

## 2016-02-15 VITALS — BP 138/93 | HR 83 | Temp 98.0°F | Ht 68.0 in | Wt 266.4 lb

## 2016-02-15 DIAGNOSIS — Z23 Encounter for immunization: Secondary | ICD-10-CM | POA: Diagnosis not present

## 2016-02-15 DIAGNOSIS — Z954 Presence of other heart-valve replacement: Secondary | ICD-10-CM | POA: Diagnosis not present

## 2016-02-15 DIAGNOSIS — I482 Chronic atrial fibrillation, unspecified: Secondary | ICD-10-CM

## 2016-02-15 LAB — COAGUCHEK XS/INR WAIVED
INR: 3.7 — ABNORMAL HIGH (ref 0.9–1.1)
Prothrombin Time: 44.9 s

## 2016-02-15 NOTE — Assessment & Plan Note (Signed)
INR up from 3.3 last time to 3.7 today patient's eating a little bit of extra salad will continue same dose recheck in 1 month.

## 2016-02-15 NOTE — Progress Notes (Signed)
   BP (!) 138/93 (BP Location: Left Arm, Patient Position: Sitting, Cuff Size: Normal)   Pulse 83   Temp 98 F (36.7 C)   Ht 5\' 8"  (1.727 m)   Wt 266 lb 6.4 oz (120.8 kg)   SpO2 95%   BMI 40.51 kg/m    Subjective:    Patient ID: Rodney CharyPaul T Donn [name], male    DOB: April 20, 1961, 55 y.o.   MRN: [id number]  HPI: Rodney Biaul T Caloca [name] is a 55 y.o. male  Chief Complaint  Patient presents with  . Coagulation Disorder  Patient doing well aortic valve replacement no complaints from warfarin bleeding bruising issues taking medications faithfully. Has been trying to lose weight and has lost a few pounds   Relevant past medical, surgical, family and social history reviewed and updated as indicated. Interim medical history since our last visit reviewed. Allergies and medications reviewed and updated.  Review of Systems  Per HPI unless specifically indicated above     Objective:    BP (!) 138/93 (BP Location: Left Arm, Patient Position: Sitting, Cuff Size: Normal)   Pulse 83   Temp 98 F (36.7 C)   Ht 5\' 8"  (1.727 m)   Wt 266 lb 6.4 oz (120.8 kg)   SpO2 95%   BMI 40.51 kg/m   Wt Readings from Last 3 Encounters:  02/15/16 266 lb 6.4 oz (120.8 kg)  01/18/16 270 lb (122.5 kg)  12/16/15 276 lb (125.2 kg)    Physical Exam  Constitutional: He is oriented to person, place, and time. He appears well-developed and well-nourished. No distress.  HENT:  Head: Normocephalic and atraumatic.  Right Ear: Hearing normal.  Left Ear: Hearing normal.  Nose: Nose normal.  Eyes: Conjunctivae and lids are normal. Right eye exhibits no discharge. Left eye exhibits no discharge. No scleral icterus.  Pulmonary/Chest: Effort normal. No respiratory distress.  Musculoskeletal: Normal range of motion.  Neurological: He is alert and oriented to person, place, and time.  Skin: Skin is intact. No rash noted.  Psychiatric: He has a normal mood and affect. His speech is normal and behavior is normal. Judgment and  thought content normal. Cognition and memory are normal.    Results for orders placed or performed in visit on 01/18/16  CoaguChek XS/INR Waived  Result Value Ref Range   INR 3.3 (H) 0.9 - 1.1   Prothrombin Time 39.0 sec      Assessment & Plan:   Problem List Items Addressed This Visit      Cardiovascular and Mediastinum   Chronic atrial fibrillation (HCC) - Primary   Relevant Orders   CoaguChek XS/INR Waived   Flu Vaccine QUAD 36+ mos PF IM (Fluarix & Fluzone Quad PF)     Other   S/P aortic valve replacement with metallic valve    INR up from 3.3 last time to 3.7 today patient's eating a little bit of extra salad will continue same dose recheck in 1 month.       Other Visit Diagnoses    Need for influenza vaccination       Relevant Orders   Flu Vaccine QUAD 36+ mos PF IM (Fluarix & Fluzone Quad PF)       Follow up plan: Return in about 4 weeks (around 03/14/2016) for PT INR, Hemoglobin A1c.

## 2016-03-07 ENCOUNTER — Other Ambulatory Visit: Payer: Self-pay | Admitting: Family Medicine

## 2016-03-22 ENCOUNTER — Encounter: Payer: Self-pay | Admitting: Family Medicine

## 2016-03-22 ENCOUNTER — Ambulatory Visit (INDEPENDENT_AMBULATORY_CARE_PROVIDER_SITE_OTHER): Payer: BLUE CROSS/BLUE SHIELD | Admitting: Family Medicine

## 2016-03-22 VITALS — BP 161/101 | HR 81 | Temp 97.5°F | Wt 268.0 lb

## 2016-03-22 DIAGNOSIS — Z954 Presence of other heart-valve replacement: Secondary | ICD-10-CM | POA: Diagnosis not present

## 2016-03-22 DIAGNOSIS — I10 Essential (primary) hypertension: Secondary | ICD-10-CM

## 2016-03-22 DIAGNOSIS — E119 Type 2 diabetes mellitus without complications: Secondary | ICD-10-CM | POA: Diagnosis not present

## 2016-03-22 DIAGNOSIS — I482 Chronic atrial fibrillation, unspecified: Secondary | ICD-10-CM

## 2016-03-22 LAB — MICROALBUMIN, URINE WAIVED
Creatinine, Urine Waived: 10 mg/dL (ref 10–300)
Microalb, Ur Waived: 10 mg/L (ref 0–19)

## 2016-03-22 LAB — COAGUCHEK XS/INR WAIVED
INR: 4.5 — ABNORMAL HIGH (ref 0.9–1.1)
Prothrombin Time: 54 s

## 2016-03-22 LAB — BAYER DCA HB A1C WAIVED: HB A1C (BAYER DCA - WAIVED): 9.4 % — ABNORMAL HIGH (ref <7.0)

## 2016-03-22 MED ORDER — HYDROCHLOROTHIAZIDE 25 MG PO TABS: 25.0000 mg | ORAL_TABLET | Freq: Every day | ORAL | 1 refills | Status: DC

## 2016-03-22 NOTE — Progress Notes (Signed)
BP (!) 161/101   Pulse 81   Temp 97.5 F (36.4 C)   Wt 268 lb (121.6 kg)   SpO2 96%   BMI 40.75 kg/m    Subjective:    Patient ID: Rodney CharyPaul T Route [name], male    DOB: 12/05/1960, 55 y.o.   MRN: [id number]  HPI: Rodney Biaul T Glauser [name] is a 55 y.o. male  Chief Complaint  Patient presents with  . Diabetes    patient to call and schedule eye exam  . Anticoagulation    taking 10mg  everyday  Reviewed with patient taking diabetes medicines most days but eating a very poor diet with a lot of quick easy carbohydrates. Patient taking his warfarin every day but this changed up his diet with not eating greens. Patient's been on the same dose of 4 for an long-term no bleeding bruising issues are noted low blood sugar issues. Discussed hypertension patient might miss medication again encourage compliance Relevant past medical, surgical, family and social history reviewed and updated as indicated. Interim medical history since our last visit reviewed. Allergies and medications reviewed and updated.  Review of Systems  Constitutional: Negative.   Respiratory: Negative.   Cardiovascular: Negative.     Per HPI unless specifically indicated above     Objective:    BP (!) 161/101   Pulse 81   Temp 97.5 F (36.4 C)   Wt 268 lb (121.6 kg)   SpO2 96%   BMI 40.75 kg/m   Wt Readings from Last 3 Encounters:  03/22/16 268 lb (121.6 kg)  02/15/16 266 lb 6.4 oz (120.8 kg)  01/18/16 270 lb (122.5 kg)    Physical Exam  Constitutional: He is oriented to person, place, and time. He appears well-developed and well-nourished. No distress.  HENT:  Head: Normocephalic and atraumatic.  Right Ear: Hearing normal.  Left Ear: Hearing normal.  Nose: Nose normal.  Eyes: Conjunctivae and lids are normal. Right eye exhibits no discharge. Left eye exhibits no discharge. No scleral icterus.  Cardiovascular: Normal rate, regular rhythm and normal heart sounds.   Pulmonary/Chest: Effort normal and breath sounds  normal. No respiratory distress.  Musculoskeletal: Normal range of motion.  Neurological: He is alert and oriented to person, place, and time.  Skin: Skin is intact. No rash noted.  Psychiatric: He has a normal mood and affect. His speech is normal and behavior is normal. Judgment and thought content normal. Cognition and memory are normal.    Results for orders placed or performed in visit on 02/15/16  CoaguChek XS/INR Waived  Result Value Ref Range   INR 3.7 (H) 0.9 - 1.1   Prothrombin Time 44.9 sec      Assessment & Plan:   Problem List Items Addressed This Visit      Cardiovascular and Mediastinum   Chronic atrial fibrillation (HCC) - Primary   Relevant Medications   hydrochlorothiazide (HYDRODIURIL) 25 MG tablet   Other Relevant Orders   CoaguChek XS/INR Waived   Hypertension    Patient will take his medicines faithfully rechecking in 2 weeks if not better will need to add more medications.      Relevant Medications   hydrochlorothiazide (HYDRODIURIL) 25 MG tablet     Endocrine   Diabetes mellitus without complication (HCC)    Discuss continued poor control and health and life issues. Patient again declining endocrinology referral stating he will do anything different. Reviewed insulin dosing again. Patient indicates he understands and will do better.  Relevant Orders   Bayer DCA Hb A1c Waived   Microalbumin, Urine Waived     Other   S/P aortic valve replacement with metallic valve    Reviewed INR just to hide patient will do better with diet had been well controlled on same dose will hold for one day then continue current medications recheck INR 2 weeks          Follow up plan: Return in about 2 weeks (around 04/05/2016) for PT INR   and in Dec , Physical Exam.

## 2016-03-22 NOTE — Assessment & Plan Note (Signed)
Discuss continued poor control and health and life issues. Patient again declining endocrinology referral stating he will do anything different. Reviewed insulin dosing again. Patient indicates he understands and will do better.

## 2016-03-22 NOTE — Assessment & Plan Note (Signed)
Patient will take his medicines faithfully rechecking in 2 weeks if not better will need to add more medications.

## 2016-03-22 NOTE — Assessment & Plan Note (Signed)
Reviewed INR just to hide patient will do better with diet had been well controlled on same dose will hold for one day then continue current medications recheck INR 2 weeks

## 2016-03-23 ENCOUNTER — Other Ambulatory Visit: Payer: Self-pay | Admitting: Family Medicine

## 2016-03-30 ENCOUNTER — Other Ambulatory Visit: Payer: Self-pay | Admitting: Family Medicine

## 2016-04-05 ENCOUNTER — Ambulatory Visit (INDEPENDENT_AMBULATORY_CARE_PROVIDER_SITE_OTHER): Payer: BLUE CROSS/BLUE SHIELD | Admitting: Family Medicine

## 2016-04-05 ENCOUNTER — Encounter: Payer: Self-pay | Admitting: Family Medicine

## 2016-04-05 VITALS — BP 153/96 | HR 101 | Temp 98.0°F | Wt 268.7 lb

## 2016-04-05 DIAGNOSIS — Z954 Presence of other heart-valve replacement: Secondary | ICD-10-CM | POA: Diagnosis not present

## 2016-04-05 DIAGNOSIS — I482 Chronic atrial fibrillation, unspecified: Secondary | ICD-10-CM

## 2016-04-05 LAB — COAGUCHEK XS/INR WAIVED
INR: 5.3 — ABNORMAL HIGH (ref 0.9–1.1)
Prothrombin Time: 63.7 s

## 2016-04-05 MED ORDER — WARFARIN SODIUM 4 MG PO TABS: 4.0000 mg | ORAL_TABLET | Freq: Every day | ORAL | 1 refills | Status: DC

## 2016-04-05 MED ORDER — WARFARIN SODIUM 5 MG PO TABS: 5.0000 mg | ORAL_TABLET | Freq: Every day | ORAL | 1 refills | Status: DC

## 2016-04-05 NOTE — Assessment & Plan Note (Signed)
Patient's Will decrease dose from 10 mg a day to 9.5 mg. They're reluctant as patient is been stable for a long long time on 10 mg.  again on review no obvious changes.

## 2016-04-05 NOTE — Progress Notes (Signed)
BP (!) 153/96 (BP Location: Left Arm, Patient Position: Sitting, Cuff Size: Large)   Pulse (!) 101   Temp 98 F (36.7 C)   Wt 268 lb 11.2 oz (121.9 kg)   SpO2 94%   BMI 40.86 kg/m    Subjective:    Patient ID: Rodney CharyPaul T Mcclurkin [name], male    DOB: 07-Oct-1960, 55 y.o.   MRN: [id number]  HPI: Rodney Biaul T Sabas [name] is a 55 y.o. male  Chief Complaint  Patient presents with  . Anticoagulation  Patient all in all doing well no complaints no bleeding bruising issues on extensive review there's been no real change in diet has enjoyed some nicer diet with the holidays. On no change in medications. Diabetes no real difference. Reviewed labs and meds Relevant past medical, surgical, family and social history reviewed and updated as indicated. Interim medical history since our last visit reviewed. Allergies and medications reviewed and updated.  Review of Systems  Constitutional: Negative.   Respiratory: Negative.   Cardiovascular: Negative.     Per HPI unless specifically indicated above     Objective:    BP (!) 153/96 (BP Location: Left Arm, Patient Position: Sitting, Cuff Size: Large)   Pulse (!) 101   Temp 98 F (36.7 C)   Wt 268 lb 11.2 oz (121.9 kg)   SpO2 94%   BMI 40.86 kg/m   Wt Readings from Last 3 Encounters:  04/05/16 268 lb 11.2 oz (121.9 kg)  03/22/16 268 lb (121.6 kg)  02/15/16 266 lb 6.4 oz (120.8 kg)    Physical Exam  Constitutional: He is oriented to person, place, and time. He appears well-developed and well-nourished. No distress.  HENT:  Head: Normocephalic and atraumatic.  Right Ear: Hearing normal.  Left Ear: Hearing normal.  Nose: Nose normal.  Eyes: Conjunctivae and lids are normal. Right eye exhibits no discharge. Left eye exhibits no discharge. No scleral icterus.  Cardiovascular: Normal rate, regular rhythm and normal heart sounds.   Pulmonary/Chest: Effort normal and breath sounds normal. No respiratory distress.  Musculoskeletal: Normal range of  motion.  Neurological: He is alert and oriented to person, place, and time.  Skin: Skin is intact. No rash noted.  No bruising issues  Psychiatric: He has a normal mood and affect. His speech is normal and behavior is normal. Judgment and thought content normal. Cognition and memory are normal.    Results for orders placed or performed in visit on 03/22/16  Bayer DCA Hb A1c Waived  Result Value Ref Range   Bayer DCA Hb A1c Waived 9.4 (H) <7.0 %  CoaguChek XS/INR Waived  Result Value Ref Range   INR 4.5 (H) 0.9 - 1.1   Prothrombin Time 54.0 sec  Microalbumin, Urine Waived  Result Value Ref Range   Microalb, Ur Waived 10 0 - 19 mg/L   Creatinine, Urine Waived 10 10 - 300 mg/dL   Microalb/Creat Ratio 30-300 (H) <30 mg/g      Assessment & Plan:   Problem List Items Addressed This Visit      Cardiovascular and Mediastinum   Chronic atrial fibrillation (HCC) - Primary   Relevant Medications   warfarin (COUMADIN) 4 MG tablet   warfarin (COUMADIN) 5 MG tablet   Other Relevant Orders   CoaguChek XS/INR Waived     Other   S/P aortic valve replacement with metallic valve    Patient's Will decrease dose from 10 mg a day to 9.5 mg. They're reluctant as patient is been  stable for a long long time on 10 mg.  again on review no obvious changes.          Follow up plan: Return in about 2 weeks (around 04/19/2016) for PT INR.

## 2016-04-15 ENCOUNTER — Other Ambulatory Visit: Payer: Self-pay | Admitting: Family Medicine

## 2016-04-18 NOTE — Telephone Encounter (Signed)
Routing to provider.  He has refills on the HCTZ. But needs the Coumadin. Has appt on 04/19/16.

## 2016-04-19 ENCOUNTER — Encounter: Payer: Self-pay | Admitting: Family Medicine

## 2016-04-19 ENCOUNTER — Ambulatory Visit (INDEPENDENT_AMBULATORY_CARE_PROVIDER_SITE_OTHER): Payer: BLUE CROSS/BLUE SHIELD | Admitting: Family Medicine

## 2016-04-19 VITALS — BP 135/84 | HR 96 | Temp 98.2°F | Ht 68.0 in | Wt 267.6 lb

## 2016-04-19 DIAGNOSIS — I482 Chronic atrial fibrillation, unspecified: Secondary | ICD-10-CM

## 2016-04-19 DIAGNOSIS — Z954 Presence of other heart-valve replacement: Secondary | ICD-10-CM | POA: Diagnosis not present

## 2016-04-19 LAB — COAGUCHEK XS/INR WAIVED
INR: 2.9 — ABNORMAL HIGH (ref 0.9–1.1)
Prothrombin Time: 34.8 s

## 2016-04-19 NOTE — Assessment & Plan Note (Signed)
The current medical regimen is effective;  continue present plan and medications. 1 mo INR

## 2016-04-19 NOTE — Progress Notes (Signed)
   BP 135/84 (BP Location: Left Arm, Patient Position: Sitting, Cuff Size: Normal)   Pulse 96   Temp 98.2 F (36.8 C)   Ht 5\' 8"  (1.727 m)   Wt 267 lb 9.6 oz (121.4 kg)   SpO2 95%   BMI 40.69 kg/m    Subjective:    Patient ID: Rodney CharyPaul T Maxton [name], male    DOB: 1960/06/10, 55 y.o.   MRN: [id number]  HPI: Rodney Biaul T Ems [name] is a 55 y.o. male  Chief Complaint  Patient presents with  . Coagulation Disorder   Patient doing well with warfarin 9 mg a day no bleeding bruising Relevant past medical, surgical, family and social history reviewed and updated as indicated. Interim medical history since our last visit reviewed. Allergies and medications reviewed and updated.  Review of Systems  Per HPI unless specifically indicated above     Objective:    BP 135/84 (BP Location: Left Arm, Patient Position: Sitting, Cuff Size: Normal)   Pulse 96   Temp 98.2 F (36.8 C)   Ht 5\' 8"  (1.727 m)   Wt 267 lb 9.6 oz (121.4 kg)   SpO2 95%   BMI 40.69 kg/m   Wt Readings from Last 3 Encounters:  04/19/16 267 lb 9.6 oz (121.4 kg)  04/05/16 268 lb 11.2 oz (121.9 kg)  03/22/16 268 lb (121.6 kg)    Physical Exam  Constitutional: He is oriented to person, place, and time. He appears well-developed and well-nourished. No distress.  HENT:  Head: Normocephalic and atraumatic.  Right Ear: Hearing normal.  Left Ear: Hearing normal.  Nose: Nose normal.  Eyes: Conjunctivae and lids are normal. Right eye exhibits no discharge. Left eye exhibits no discharge. No scleral icterus.  Pulmonary/Chest: Effort normal. No respiratory distress.  Musculoskeletal: Normal range of motion.  Neurological: He is alert and oriented to person, place, and time.  Skin: Skin is intact. No rash noted.  Psychiatric: He has a normal mood and affect. His speech is normal and behavior is normal. Judgment and thought content normal. Cognition and memory are normal.    Results for orders placed or performed in visit on  04/05/16  CoaguChek XS/INR Waived  Result Value Ref Range   INR 5.3 (H) 0.9 - 1.1   Prothrombin Time 63.7 sec      Assessment & Plan:   Problem List Items Addressed This Visit      Cardiovascular and Mediastinum   Chronic atrial fibrillation (HCC) - Primary   Relevant Orders   CoaguChek XS/INR Waived     Other   S/P aortic valve replacement with metallic valve    The current medical regimen is effective;  continue present plan and medications. 1 mo INR          Follow up plan: Return in about 4 weeks (around 05/17/2016) for PT INR, Physical Exam.

## 2016-04-20 ENCOUNTER — Other Ambulatory Visit: Payer: Self-pay | Admitting: Family Medicine

## 2016-05-11 ENCOUNTER — Encounter: Payer: Self-pay | Admitting: Family Medicine

## 2016-05-11 ENCOUNTER — Ambulatory Visit (INDEPENDENT_AMBULATORY_CARE_PROVIDER_SITE_OTHER): Payer: BLUE CROSS/BLUE SHIELD | Admitting: Family Medicine

## 2016-05-11 ENCOUNTER — Encounter: Payer: BLUE CROSS/BLUE SHIELD | Admitting: Family Medicine

## 2016-05-11 VITALS — BP 146/97 | HR 92 | Temp 98.3°F | Wt 268.0 lb

## 2016-05-11 DIAGNOSIS — B9789 Other viral agents as the cause of diseases classified elsewhere: Secondary | ICD-10-CM

## 2016-05-11 DIAGNOSIS — J069 Acute upper respiratory infection, unspecified: Secondary | ICD-10-CM | POA: Diagnosis not present

## 2016-05-11 MED ORDER — BENZONATATE 100 MG PO CAPS: 200.0000 mg | ORAL_CAPSULE | Freq: Three times a day (TID) | ORAL | 0 refills | Status: DC | PRN

## 2016-05-11 NOTE — Patient Instructions (Signed)
Follow up as needed

## 2016-05-11 NOTE — Progress Notes (Signed)
   BP (!) 146/97   Pulse 92   Temp 98.3 F (36.8 C)   Wt 268 lb (121.6 kg)   SpO2 91%   BMI 40.75 kg/m    Subjective:    Patient ID: Rodney CharyPaul T Kotas [name], male    DOB: 1960-11-23, 56 y.o.   MRN: [id number]  HPI: Rodney Biaul T Wisener [name] is a 56 y.o. male  Chief Complaint  Patient presents with  . URI    x 3 days, productive cough, chest congestion, some sinus drainage. No sore throat, no ear ache, no fever.  Feeling better today.   Patient presents with 3 day history of productive cough, congestion, and sinus pressure. Denies sore throat, fever, body aches, CP, SOB. Has been taking several OTC cold and flu medications with some good relief. Feeling some better today than he has been. No sick contacts. Cough is most bothersome symptom per patient.   Relevant past medical, surgical, family and social history reviewed and updated as indicated. Interim medical history since our last visit reviewed. Allergies and medications reviewed and updated.  Review of Systems  Constitutional: Negative.   HENT: Positive for congestion and sinus pressure.   Eyes: Negative.   Respiratory: Positive for cough.   Cardiovascular: Negative.   Gastrointestinal: Negative.   Genitourinary: Negative.   Musculoskeletal: Negative.   Skin: Negative.   Neurological: Negative.   Psychiatric/Behavioral: Negative.     Per HPI unless specifically indicated above     Objective:    BP (!) 146/97   Pulse 92   Temp 98.3 F (36.8 C)   Wt 268 lb (121.6 kg)   SpO2 91%   BMI 40.75 kg/m   Wt Readings from Last 3 Encounters:  05/11/16 268 lb (121.6 kg)  04/19/16 267 lb 9.6 oz (121.4 kg)  04/05/16 268 lb 11.2 oz (121.9 kg)    Physical Exam  Constitutional: He is oriented to person, place, and time. He appears well-developed and well-nourished.  HENT:  Head: Atraumatic.  Right Ear: External ear normal.  Left Ear: External ear normal.  Nose: Nose normal.  Mouth/Throat: Oropharynx is clear and moist. No  oropharyngeal exudate.  Neck: Normal range of motion. Neck supple.  Cardiovascular: Normal rate and normal heart sounds.   Pulmonary/Chest: Effort normal and breath sounds normal. No respiratory distress.  Musculoskeletal: Normal range of motion.  Lymphadenopathy:    He has no cervical adenopathy.  Neurological: He is alert and oriented to person, place, and time.  Skin: Skin is warm and dry.  Psychiatric: He has a normal mood and affect. His behavior is normal.  Nursing note and vitals reviewed.     Assessment & Plan:   Problem List Items Addressed This Visit    None    Visit Diagnoses    Viral URI with cough    -  Primary   Supportive care, continue OTC medications. Tessalon perles sent for cough. Follow up if no improvement.        Follow up plan: Return if symptoms worsen or fail to improve.

## 2016-05-17 ENCOUNTER — Other Ambulatory Visit: Payer: Self-pay | Admitting: Family Medicine

## 2016-05-17 ENCOUNTER — Encounter: Payer: BLUE CROSS/BLUE SHIELD | Admitting: Family Medicine

## 2016-06-10 ENCOUNTER — Other Ambulatory Visit: Payer: Self-pay | Admitting: Family Medicine

## 2016-06-10 DIAGNOSIS — I482 Chronic atrial fibrillation, unspecified: Secondary | ICD-10-CM

## 2016-06-13 ENCOUNTER — Other Ambulatory Visit: Payer: Self-pay | Admitting: Family Medicine

## 2016-06-13 NOTE — Telephone Encounter (Signed)
Last OV: 05/11/16 Next OV: 06/22/16   Lab Results  Component Value Date   INR 2.9 (H) 04/19/2016   INR 5.3 (H) 04/05/2016   INR 4.5 (H) 03/22/2016

## 2016-06-22 ENCOUNTER — Encounter: Payer: BLUE CROSS/BLUE SHIELD | Admitting: Family Medicine

## 2016-07-06 ENCOUNTER — Other Ambulatory Visit: Payer: Self-pay | Admitting: Family Medicine

## 2016-07-06 MED ORDER — INSULIN GLARGINE 300 UNIT/ML ~~LOC~~ SOPN: 85.0000 [IU] | PEN_INJECTOR | Freq: Every day | SUBCUTANEOUS | 12 refills | Status: DC

## 2016-07-21 ENCOUNTER — Ambulatory Visit (INDEPENDENT_AMBULATORY_CARE_PROVIDER_SITE_OTHER): Payer: BLUE CROSS/BLUE SHIELD | Admitting: Family Medicine

## 2016-07-21 ENCOUNTER — Encounter: Payer: Self-pay | Admitting: Family Medicine

## 2016-07-21 VITALS — BP 167/103 | HR 93 | Temp 98.3°F | Wt 271.0 lb

## 2016-07-21 DIAGNOSIS — M79672 Pain in left foot: Secondary | ICD-10-CM

## 2016-07-21 MED ORDER — GABAPENTIN 300 MG PO CAPS: 600.0000 mg | ORAL_CAPSULE | Freq: Two times a day (BID) | ORAL | 0 refills | Status: DC

## 2016-07-21 MED ORDER — HYDROCHLOROTHIAZIDE 25 MG PO TABS: 25.0000 mg | ORAL_TABLET | Freq: Every day | ORAL | 0 refills | Status: DC

## 2016-07-21 MED ORDER — LIRAGLUTIDE 18 MG/3ML ~~LOC~~ SOPN: 1.8000 mg | PEN_INJECTOR | Freq: Every day | SUBCUTANEOUS | 0 refills | Status: DC

## 2016-07-21 MED ORDER — CARVEDILOL 25 MG PO TABS: 25.0000 mg | ORAL_TABLET | Freq: Two times a day (BID) | ORAL | 0 refills | Status: DC

## 2016-07-21 NOTE — Patient Instructions (Signed)
Follow up for physical exam as scheduled

## 2016-07-21 NOTE — Progress Notes (Signed)
BP (!) 167/103   Pulse 93   Temp 98.3 F (36.8 C)   Wt 271 lb (122.9 kg)   SpO2 96%   BMI 41.21 kg/m    Subjective:    Patient ID: Rodney CharyPaul T Discher [name], male    DOB: 1960-09-06, 56 y.o.   MRN: [id number]  HPI: Rodney Biaul T Auvil [name] is a 56 y.o. male  Chief Complaint  Patient presents with  . Foot Pain     left foot x 2 days, no known injury. hurts on the bottom of his foot. hurts to stand and walk. No swelling.  . Medication Refill    He is out of his Victoza, Gabapentin, HCTZ, and Coreg   Foot pain Patient with a history of DM, Afib, and valve replacement on Coumadin presents to clinic complaining of left dorsal foot pain x 1 day. He states he thinks there was something in his shoe and he stepped on it for too long. No fever/chills, worsening tingling/numbness, injury, bleeding, drainage, puncture wound. Hurts to walk on it and has caused him to limp. Has not applied anything on it. He was concerned due to his history of diabetes and neuropathy.Tetanus UTD.   Medication refill Patient presents requesting for a refill on Victoza, Gabapentin, HCTZ, and Coreg. He is scheduled for an annual physical exam on 07/25/16 with his PCP, Dr. Dossie Arbourrissman. Has been out of his medications for several weeks now.   Relevant past medical, surgical, family and social history reviewed and updated as indicated. Interim medical history since our last visit reviewed. Allergies and medications reviewed and updated.  Review of Systems  Constitutional: Negative for chills and fever.  Cardiovascular: Negative for leg swelling.  Musculoskeletal:       Foot pain  Neurological: Negative for numbness.   Per HPI unless specifically indicated above     Objective:    BP (!) 167/103   Pulse 93   Temp 98.3 F (36.8 C)   Wt 271 lb (122.9 kg)   SpO2 96%   BMI 41.21 kg/m   Wt Readings from Last 3 Encounters:  07/21/16 271 lb (122.9 kg)  05/11/16 268 lb (121.6 kg)  04/19/16 267 lb 9.6 oz (121.4 kg)      Physical Exam  Constitutional: He is oriented to person, place, and time. He appears well-developed and well-nourished. No distress.  HENT:  Head: Normocephalic and atraumatic.  Eyes: Conjunctivae are normal. Right eye exhibits no discharge. Left eye exhibits no discharge.  Neck: Normal range of motion. Neck supple.  Cardiovascular: Normal rate, regular rhythm, normal heart sounds and intact distal pulses.   Pulmonary/Chest: Effort normal and breath sounds normal. No stridor. No respiratory distress. He has no wheezes. He has no rales.  Abdominal: Soft. Bowel sounds are normal. He exhibits no distension. There is no tenderness.  Musculoskeletal: Normal range of motion.  1 mm petechial lesion on plantar aspect of left foot that is tender to palpation. No broken skin, drainage, swelling, bleeding, warmth.   Neurological: He is alert and oriented to person, place, and time.  Neurovascularly intact.  Skin: Skin is warm and dry. No rash noted.  Psychiatric: He has a normal mood and affect. His behavior is normal. Judgment and thought content normal.  Nursing note and vitals reviewed.      Assessment & Plan:   Problem List Items Addressed This Visit    None    Visit Diagnoses    Left foot pain    -  Primary   No concern for infection at this time due to no skin breakdown or puncture wound. Patient advised to bear as little weight as possible and monitor it for skin b     Victoza, Gabapentin, HCTZ, and Coreg refilled. Patient agreed to return to clinic on Monday for appointment with Dr. Dossie Arbour for annual physical exam and INR recheck.    Follow up plan: Return in about 4 days (around 07/25/2016) for annual exam as scheduled.

## 2016-07-25 ENCOUNTER — Encounter: Payer: Self-pay | Admitting: Family Medicine

## 2016-07-25 ENCOUNTER — Ambulatory Visit (INDEPENDENT_AMBULATORY_CARE_PROVIDER_SITE_OTHER): Payer: BLUE CROSS/BLUE SHIELD | Admitting: Family Medicine

## 2016-07-25 VITALS — BP 175/115 | HR 85 | Ht 70.47 in | Wt 268.0 lb

## 2016-07-25 DIAGNOSIS — I10 Essential (primary) hypertension: Secondary | ICD-10-CM | POA: Diagnosis not present

## 2016-07-25 DIAGNOSIS — Z1211 Encounter for screening for malignant neoplasm of colon: Secondary | ICD-10-CM

## 2016-07-25 DIAGNOSIS — Z0000 Encounter for general adult medical examination without abnormal findings: Secondary | ICD-10-CM | POA: Diagnosis not present

## 2016-07-25 DIAGNOSIS — I482 Chronic atrial fibrillation, unspecified: Secondary | ICD-10-CM

## 2016-07-25 DIAGNOSIS — Z1329 Encounter for screening for other suspected endocrine disorder: Secondary | ICD-10-CM

## 2016-07-25 DIAGNOSIS — Z125 Encounter for screening for malignant neoplasm of prostate: Secondary | ICD-10-CM | POA: Diagnosis not present

## 2016-07-25 DIAGNOSIS — E785 Hyperlipidemia, unspecified: Secondary | ICD-10-CM | POA: Diagnosis not present

## 2016-07-25 DIAGNOSIS — E119 Type 2 diabetes mellitus without complications: Secondary | ICD-10-CM | POA: Diagnosis not present

## 2016-07-25 DIAGNOSIS — R5382 Chronic fatigue, unspecified: Secondary | ICD-10-CM | POA: Diagnosis not present

## 2016-07-25 LAB — URINALYSIS, ROUTINE W REFLEX MICROSCOPIC
Bilirubin, UA: NEGATIVE
Ketones, UA: NEGATIVE
Leukocytes, UA: NEGATIVE
Nitrite, UA: NEGATIVE
Protein, UA: NEGATIVE
RBC, UA: NEGATIVE
Specific Gravity, UA: <1.005 — ABNORMAL LOW (ref 1.005–1.030)
Urobilinogen, Ur: 0.2 mg/dL (ref 0.2–1.0)
pH, UA: 5.5 (ref 5.0–7.5)

## 2016-07-25 LAB — COAGUCHEK XS/INR WAIVED
INR: 2.4 — ABNORMAL HIGH (ref 0.9–1.1)
Prothrombin Time: 29 s

## 2016-07-25 LAB — MICROSCOPIC EXAMINATION
Bacteria, UA: NONE SEEN
RBC, UA: NONE SEEN /hpf (ref 0–?)
WBC, UA: NONE SEEN /hpf (ref 0–?)

## 2016-07-25 NOTE — Assessment & Plan Note (Signed)
Continued fatigue with very stressful lifestyle with muscle aches and headaches. Discussed sleep apnea patient hasn't had evaluation because of cost.

## 2016-07-25 NOTE — Assessment & Plan Note (Signed)
Continued poor control of diabetes with difficulty of management will refer to endocrinology to further assist with management.

## 2016-07-25 NOTE — Progress Notes (Signed)
BP (!) 175/115   Pulse 85   Ht 5' 10.47" (1.79 m)   Wt 268 lb (121.6 kg)   SpO2 96%   BMI 37.94 kg/m    Subjective:    Patient ID: Rodney CharyPaul T Shill [name], male    DOB: Oct 05, 1960, 56 y.o.   MRN: [id number]  HPI: Rodney Biaul T Penn [name] is a 56 y.o. male  Chief Complaint  Patient presents with  . Annual Exam  . Arm Pain  Patient follow-up diabetes not taking Victoza because it's too expensive. Using 85 units of Toujeo and blood sugar still remaining 2 I also taking metformin. Discussed with patient blood pressure elevated to high not sure if he's taking Benzapril patient also just started back on his medications yesterday. But on chart review blood pressures been too high anyway. Taking fenofibrate without problems. Line taking gabapentin with somewhat adequate control of pain in his legs. Taken warfarin without problems. Right biceps area with some cramping-like sensations been ongoing off and on for about 2 weeks primarily sometimes at rest not really with activity. No specific muscle group aggravating. Patient under a great deal of stress both from business and taking care of mother and grandmother with dementia. Having headache issues but also associated with not taking blood pressure medications. No issues of blurry vision pounding or residual headache now.  Relevant past medical, surgical, family and social history reviewed and updated as indicated. Interim medical history since our last visit reviewed. Allergies and medications reviewed and updated.  Review of Systems  Constitutional: Negative.   HENT: Negative.   Eyes: Negative.   Respiratory: Negative.   Cardiovascular: Negative.   Gastrointestinal: Negative.   Endocrine: Negative.   Genitourinary: Negative.   Musculoskeletal: Negative.   Skin: Negative.   Allergic/Immunologic: Negative.   Neurological: Negative.   Hematological: Negative.   Psychiatric/Behavioral: Negative.     Per HPI unless specifically indicated  above     Objective:    BP (!) 175/115   Pulse 85   Ht 5' 10.47" (1.79 m)   Wt 268 lb (121.6 kg)   SpO2 96%   BMI 37.94 kg/m   Wt Readings from Last 3 Encounters:  07/25/16 268 lb (121.6 kg)  07/21/16 271 lb (122.9 kg)  05/11/16 268 lb (121.6 kg)    Physical Exam  Constitutional: He is oriented to person, place, and time. He appears well-developed and well-nourished.  HENT:  Head: Normocephalic.  Right Ear: External ear normal.  Left Ear: External ear normal.  Nose: Nose normal.  Eyes: Conjunctivae and EOM are normal. Pupils are equal, round, and reactive to light.  Neck: Normal range of motion. Neck supple. No thyromegaly present.  Cardiovascular: Normal rate, regular rhythm, normal heart sounds and intact distal pulses.   Pulmonary/Chest: Effort normal and breath sounds normal.  Abdominal: Soft. Bowel sounds are normal. There is no splenomegaly or hepatomegaly.  Genitourinary: Penis normal.  Genitourinary Comments: Prostate exam pt refused Patient also with some left upper leg adjacent to growing area of slight swelling more like a lipoma hasn't changed in years. Nontender.  Musculoskeletal: Normal range of motion.  Lymphadenopathy:    He has no cervical adenopathy.  Neurological: He is alert and oriented to person, place, and time. He has normal reflexes.  Skin: Skin is warm and dry.  Multiple scratches scabs on arms patient has been working outside with tree limbs  Psychiatric: He has a normal mood and affect. His behavior is normal. Judgment and thought content normal.  Results for orders placed or performed in visit on 04/19/16  CoaguChek XS/INR Waived  Result Value Ref Range   INR 2.9 (H) 0.9 - 1.1   Prothrombin Time 34.8 sec      Assessment & Plan:   Problem List Items Addressed This Visit      Cardiovascular and Mediastinum   Chronic atrial fibrillation (HCC)    INR 2.4 today with goal of 2.5 discussed and will continue warfarin same dose.       Relevant Orders   CoaguChek XS/INR Waived   Hypertension    Continued poor control patient will double check to see if taking Benzapril gave written directions and if not taking Benzapril will add amlodipine.      Relevant Orders   CBC with Differential/Platelet   Comprehensive metabolic panel   Urinalysis, Routine w reflex microscopic   Hemoglobin A1c     Endocrine   Diabetes mellitus without complication (HCC)    Continued poor control of diabetes with difficulty of management will refer to endocrinology to further assist with management.      Relevant Orders   CBC with Differential/Platelet   Comprehensive metabolic panel   Urinalysis, Routine w reflex microscopic   Hemoglobin A1c   Ambulatory referral to Endocrinology     Other   Hyperlipidemia    Labs pending      Relevant Orders   CBC with Differential/Platelet   Comprehensive metabolic panel   Lipid panel   Urinalysis, Routine w reflex microscopic   Hemoglobin A1c   Fatigue    Continued fatigue with very stressful lifestyle with muscle aches and headaches. Discussed sleep apnea patient hasn't had evaluation because of cost.       Other Visit Diagnoses    Annual physical exam    -  Primary   Relevant Orders   CBC with Differential/Platelet   Comprehensive metabolic panel   Lipid panel   PSA   TSH   Urinalysis, Routine w reflex microscopic   Hemoglobin A1c   CoaguChek XS/INR Waived   Prostate cancer screening       Relevant Orders   PSA   Thyroid disorder screen       Relevant Orders   TSH   Colon cancer screening       Relevant Orders   Cologuard       Follow up plan: Return in about 4 weeks (around 08/22/2016) for BP check and , PT INR.

## 2016-07-25 NOTE — Assessment & Plan Note (Signed)
Continued poor control patient will double check to see if taking Benzapril gave written directions and if not taking Benzapril will add amlodipine.

## 2016-07-25 NOTE — Assessment & Plan Note (Signed)
Labs pending.  

## 2016-07-25 NOTE — Assessment & Plan Note (Signed)
INR 2.4 today with goal of 2.5 discussed and will continue warfarin same dose.

## 2016-07-26 LAB — COMPREHENSIVE METABOLIC PANEL
ALT: 32 IU/L (ref 0–44)
AST: 20 IU/L (ref 0–40)
Albumin/Globulin Ratio: 1.5 (ref 1.2–2.2)
Albumin: 4.4 g/dL (ref 3.5–5.5)
Alkaline Phosphatase: 70 IU/L (ref 39–117)
BUN/Creatinine Ratio: 18 (ref 9–20)
BUN: 18 mg/dL (ref 6–24)
Bilirubin Total: 0.5 mg/dL (ref 0.0–1.2)
CO2: 24 mmol/L (ref 18–29)
Calcium: 9.4 mg/dL (ref 8.7–10.2)
Chloride: 92 mmol/L — ABNORMAL LOW (ref 96–106)
Creatinine, Ser: 1.01 mg/dL (ref 0.76–1.27)
GFR calc Af Amer: 96 mL/min/{1.73_m2} (ref >59)
GFR calc non Af Amer: 83 mL/min/{1.73_m2} (ref >59)
Globulin, Total: 3 g/dL (ref 1.5–4.5)
Glucose: 241 mg/dL — ABNORMAL HIGH (ref 65–99)
Potassium: 3.8 mmol/L (ref 3.5–5.2)
Sodium: 132 mmol/L — ABNORMAL LOW (ref 134–144)
Total Protein: 7.4 g/dL (ref 6.0–8.5)

## 2016-07-26 LAB — CBC WITH DIFFERENTIAL/PLATELET
Basophils Absolute: 0 10*3/uL (ref 0.0–0.2)
Basos: 0 %
EOS (ABSOLUTE): 0.2 10*3/uL (ref 0.0–0.4)
Eos: 2 %
Hematocrit: 44.7 % (ref 37.5–51.0)
Hemoglobin: 15.1 g/dL (ref 13.0–17.7)
Immature Grans (Abs): 0.1 10*3/uL (ref 0.0–0.1)
Immature Granulocytes: 1 %
Lymphocytes Absolute: 2.1 10*3/uL (ref 0.7–3.1)
Lymphs: 23 %
MCH: 27.9 pg (ref 26.6–33.0)
MCHC: 33.8 g/dL (ref 31.5–35.7)
MCV: 83 fL (ref 79–97)
Monocytes Absolute: 0.6 10*3/uL (ref 0.1–0.9)
Monocytes: 7 %
Neutrophils Absolute: 6.2 10*3/uL (ref 1.4–7.0)
Neutrophils: 67 %
Platelets: 259 10*3/uL (ref 150–379)
RBC: 5.41 x10E6/uL (ref 4.14–5.80)
RDW: 14.5 % (ref 12.3–15.4)
WBC: 9.1 10*3/uL (ref 3.4–10.8)

## 2016-07-26 LAB — HEMOGLOBIN A1C
Est. average glucose Bld gHb Est-mCnc: 258 mg/dL
Hgb A1c MFr Bld: 10.6 % — ABNORMAL HIGH (ref 4.8–5.6)

## 2016-07-26 LAB — LIPID PANEL
Chol/HDL Ratio: 7.4 ratio units — ABNORMAL HIGH (ref 0.0–5.0)
Cholesterol, Total: 222 mg/dL — ABNORMAL HIGH (ref 100–199)
HDL: 30 mg/dL — ABNORMAL LOW (ref >39)
Triglycerides: 459 mg/dL — ABNORMAL HIGH (ref 0–149)

## 2016-07-26 LAB — TSH: TSH: 2.5 u[IU]/mL (ref 0.450–4.500)

## 2016-07-26 LAB — PSA: Prostate Specific Ag, Serum: 0.3 ng/mL (ref 0.0–4.0)

## 2016-07-27 NOTE — Telephone Encounter (Signed)
Phone call Discussed abnormal labs with patient and need for her improvement with glucose patient pending appointment with endocrinology.

## 2016-08-17 ENCOUNTER — Other Ambulatory Visit: Payer: Self-pay | Admitting: Family Medicine

## 2016-08-17 NOTE — Telephone Encounter (Signed)
Dr. Crissman's patient. 

## 2016-08-19 ENCOUNTER — Other Ambulatory Visit: Payer: Self-pay | Admitting: Family Medicine

## 2016-08-19 DIAGNOSIS — I482 Chronic atrial fibrillation, unspecified: Secondary | ICD-10-CM

## 2016-08-23 ENCOUNTER — Other Ambulatory Visit: Payer: Self-pay | Admitting: Family Medicine

## 2016-08-23 DIAGNOSIS — I482 Chronic atrial fibrillation, unspecified: Secondary | ICD-10-CM

## 2016-08-25 ENCOUNTER — Ambulatory Visit: Payer: Self-pay | Admitting: Family Medicine

## 2016-09-05 ENCOUNTER — Other Ambulatory Visit: Payer: Self-pay | Admitting: Family Medicine

## 2016-09-06 NOTE — Telephone Encounter (Signed)
Your patient 

## 2016-09-08 ENCOUNTER — Encounter: Payer: Self-pay | Admitting: Family Medicine

## 2016-09-08 ENCOUNTER — Ambulatory Visit (INDEPENDENT_AMBULATORY_CARE_PROVIDER_SITE_OTHER): Payer: BLUE CROSS/BLUE SHIELD | Admitting: Family Medicine

## 2016-09-08 VITALS — BP 162/103 | HR 92 | Wt 268.0 lb

## 2016-09-08 DIAGNOSIS — I482 Chronic atrial fibrillation, unspecified: Secondary | ICD-10-CM

## 2016-09-08 DIAGNOSIS — Z954 Presence of other heart-valve replacement: Secondary | ICD-10-CM | POA: Diagnosis not present

## 2016-09-08 DIAGNOSIS — I10 Essential (primary) hypertension: Secondary | ICD-10-CM

## 2016-09-08 DIAGNOSIS — E119 Type 2 diabetes mellitus without complications: Secondary | ICD-10-CM | POA: Diagnosis not present

## 2016-09-08 LAB — COAGUCHEK XS/INR WAIVED
INR: 2.8 — ABNORMAL HIGH (ref 0.9–1.1)
Prothrombin Time: 33.4 s

## 2016-09-08 MED ORDER — DAPAGLIFLOZIN PROPANEDIOL 10 MG PO TABS: 10.0000 mg | ORAL_TABLET | Freq: Every day | ORAL | 6 refills | Status: DC

## 2016-09-08 NOTE — Assessment & Plan Note (Signed)
Reviewed will start [place]  Putting request for endocrine consult again with proper phone numbers.

## 2016-09-08 NOTE — Progress Notes (Signed)
BP (!) 162/103   Pulse 92   Wt 268 lb (121.6 kg)   SpO2 94%   BMI 37.94 kg/m    Subjective:    Patient ID: Rodney CharyPaul T Ebeling [name], male    DOB: [birth year], 56 y.o.   MRN: [id number]  HPI: Rodney Biaul T Engelbrecht [name] is a 56 y.o. male  Chief Complaint  Patient presents with  . Coagulation Disorder  . Arm Pain  No problems with warfarin no bleeding bruising issues takes faithfully without problems Patient's biggest concern is over the last 7 days no known specific trauma but right arm was sudden downward motion develops pain in the right biceps area some tenderness in this area but nothing specific on palpation Patient didn't get appointment with endocrine as he missed the phone calls. We will try again. Patient's insurance is changed will try [facility]Farxiga again gave coupons patient did well with Mar[facility] DeistFarxiga previously.  Relevant past medical, surgical, family and social history reviewed and updated as indicated. Interim medical history since our last visit reviewed. Allergies and medications reviewed and updated.  Review of Systems  Constitutional: Negative.   Respiratory: Negative.   Cardiovascular: Negative.     Per HPI unless specifically indicated above     Objective:    BP (!) 162/103   Pulse 92   Wt 268 lb (121.6 kg)   SpO2 94%   BMI 37.94 kg/m   Wt Readings from Last 3 Encounters:  09/08/16 268 lb (121.6 kg)  07/25/16 268 lb (121.6 kg)  07/21/16 271 lb (122.9 kg)    Physical Exam  Constitutional: He is oriented to person, place, and time. He appears well-developed and well-nourished.  HENT:  Head: Normocephalic and atraumatic.  Eyes: Conjunctivae and EOM are normal.  Neck: Normal range of motion.  Cardiovascular: Normal rate, regular rhythm and normal heart sounds.   Pulmonary/Chest: Effort normal and breath sounds normal.  Musculoskeletal: Normal range of motion.  Right arm clear with no specific lesions  Neurological: He is alert and oriented to person, place, and time.    Skin: No erythema.  Psychiatric: He has a normal mood and affect. His behavior is normal. Judgment and thought content normal.    Results for orders placed or performed in visit on 07/25/16  Microscopic Examination  Result Value Ref Range   WBC, UA None seen 0 - 5 /hpf   RBC, UA None seen 0 - 2 /hpf   Epithelial Cells (non renal) CANCELED    Bacteria, UA None seen None seen/Few  CBC with Differential/Platelet  Result Value Ref Range   WBC 9.1 3.4 - 10.8 x10E3/uL   RBC 5.41 4.14 - 5.80 x10E6/uL   Hemoglobin 15.1 13.0 - 17.7 g/dL   Hematocrit 08.644.7 57.837.5 - 51.0 %   MCV 83 79 - 97 fL   MCH 27.9 26.6 - 33.0 pg   MCHC 33.8 31.5 - 35.7 g/dL   RDW 46.914.5 62.912.3 - 52.815.4 %   Platelets 259 150 - 379 x10E3/uL   Neutrophils 67 Not Estab. %   Lymphs 23 Not Estab. %   Monocytes 7 Not Estab. %   Eos 2 Not Estab. %   Basos 0 Not Estab. %   Neutrophils Absolute 6.2 1.4 - 7.0 x10E3/uL   Lymphocytes Absolute 2.1 0.7 - 3.1 x10E3/uL   Monocytes Absolute 0.6 0.1 - 0.9 x10E3/uL   EOS (ABSOLUTE) 0.2 0.0 - 0.4 x10E3/uL   Basophils Absolute 0.0 0.0 - 0.2 x10E3/uL   Immature Granulocytes 1 Not  Estab. %   Immature Grans (Abs) 0.1 0.0 - 0.1 x10E3/uL  Comprehensive metabolic panel  Result Value Ref Range   Glucose 241 (H) 65 - 99 mg/dL   BUN 18 6 - 24 mg/dL   Creatinine, Ser 6.96 0.76 - 1.27 mg/dL   GFR calc non Af Amer 83 >59 mL/min/1.73   GFR calc Af Amer 96 >59 mL/min/1.73   BUN/Creatinine Ratio 18 9 - 20   Sodium 132 (L) 134 - 144 mmol/L   Potassium 3.8 3.5 - 5.2 mmol/L   Chloride 92 (L) 96 - 106 mmol/L   CO2 24 18 - 29 mmol/L   Calcium 9.4 8.7 - 10.2 mg/dL   Total Protein 7.4 6.0 - 8.5 g/dL   Albumin 4.4 3.5 - 5.5 g/dL   Globulin, Total 3.0 1.5 - 4.5 g/dL   Albumin/Globulin Ratio 1.5 1.2 - 2.2   Bilirubin Total 0.5 0.0 - 1.2 mg/dL   Alkaline Phosphatase 70 39 - 117 IU/L   AST 20 0 - 40 IU/L   ALT 32 0 - 44 IU/L  Lipid panel  Result Value Ref Range   Cholesterol, Total 222 (H) 100 - 199  mg/dL   Triglycerides 295 (H) 0 - 149 mg/dL   HDL 30 (L) >28 mg/dL   VLDL Cholesterol Cal Comment 5 - 40 mg/dL   LDL Calculated Comment 0 - 99 mg/dL   Chol/HDL Ratio 7.4 (H) 0.0 - 5.0 ratio units  PSA  Result Value Ref Range   Prostate Specific Ag, Serum 0.3 0.0 - 4.0 ng/mL  TSH  Result Value Ref Range   TSH 2.500 0.450 - 4.500 uIU/mL  Urinalysis, Routine w reflex microscopic  Result Value Ref Range   Specific Gravity, UA <1.005 (L) 1.005 - 1.030   pH, UA 5.5 5.0 - 7.5   Color, UA Yellow Yellow   Appearance Ur Clear Clear   Leukocytes, UA Negative Negative   Protein, UA Negative Negative/Trace   Glucose, UA 1+ (A) Negative   Ketones, UA Negative Negative   RBC, UA Negative Negative   Bilirubin, UA Negative Negative   Urobilinogen, Ur 0.2 0.2 - 1.0 mg/dL   Nitrite, UA Negative Negative   Microscopic Examination See below:   Hemoglobin A1c  Result Value Ref Range   Hgb A1c MFr Bld 10.6 (H) 4.8 - 5.6 %   Est. average glucose Bld gHb Est-mCnc 258 mg/dL  CoaguChek XS/INR Waived  Result Value Ref Range   INR 2.4 (H) 0.9 - 1.1   Prothrombin Time 29.0 sec      Assessment & Plan:   Problem List Items Addressed This Visit      Cardiovascular and Mediastinum   Chronic atrial fibrillation (HCC)   Relevant Orders   CoaguChek XS/INR Waived   Hypertension - Primary     Endocrine   Diabetes mellitus without complication (HCC)    Reviewed will start [place]  Putting request for endocrine consult again with proper phone numbers.      Relevant Medications   dapagliflozin propanediol (FARXIGA) 10 MG TABS tablet   Other Relevant Orders   Ambulatory referral to Endocrinology     Other   S/P aortic valve replacement with metallic valve    INR doing well will continue present Coumadin dosing          Follow up plan: Return in about 4 weeks (around 10/06/2016) for PT INR.

## 2016-09-08 NOTE — Assessment & Plan Note (Signed)
INR doing well will continue present Coumadin dosing

## 2016-09-18 ENCOUNTER — Other Ambulatory Visit: Payer: Self-pay | Admitting: Family Medicine

## 2016-10-17 ENCOUNTER — Other Ambulatory Visit: Payer: Self-pay | Admitting: Family Medicine

## 2016-10-17 DIAGNOSIS — I482 Chronic atrial fibrillation, unspecified: Secondary | ICD-10-CM

## 2016-10-18 ENCOUNTER — Ambulatory Visit (INDEPENDENT_AMBULATORY_CARE_PROVIDER_SITE_OTHER): Payer: BLUE CROSS/BLUE SHIELD | Admitting: Family Medicine

## 2016-10-18 ENCOUNTER — Encounter: Payer: Self-pay | Admitting: Family Medicine

## 2016-10-18 VITALS — BP 149/82 | HR 68 | Ht 68.0 in | Wt 264.0 lb

## 2016-10-18 DIAGNOSIS — I482 Chronic atrial fibrillation, unspecified: Secondary | ICD-10-CM

## 2016-10-18 DIAGNOSIS — E119 Type 2 diabetes mellitus without complications: Secondary | ICD-10-CM | POA: Diagnosis not present

## 2016-10-18 DIAGNOSIS — I10 Essential (primary) hypertension: Secondary | ICD-10-CM

## 2016-10-18 LAB — COAGUCHEK XS/INR WAIVED
INR: 2.7 — ABNORMAL HIGH (ref 0.9–1.1)
Prothrombin Time: 32.2 s

## 2016-10-18 MED ORDER — CARVEDILOL 25 MG PO TABS: 25.0000 mg | ORAL_TABLET | Freq: Two times a day (BID) | ORAL | 3 refills | Status: DC

## 2016-10-18 MED ORDER — FENOFIBRATE 160 MG PO TABS: 160.0000 mg | ORAL_TABLET | Freq: Every day | ORAL | 3 refills | Status: DC

## 2016-10-18 MED ORDER — GABAPENTIN 300 MG PO CAPS: 600.0000 mg | ORAL_CAPSULE | Freq: Two times a day (BID) | ORAL | 3 refills | Status: DC

## 2016-10-18 MED ORDER — DAPAGLIFLOZIN PROPANEDIOL 10 MG PO TABS: 10.0000 mg | ORAL_TABLET | Freq: Every day | ORAL | 3 refills | Status: DC

## 2016-10-18 MED ORDER — METFORMIN HCL 500 MG PO TABS: ORAL_TABLET | ORAL | 4 refills | Status: DC

## 2016-10-18 MED ORDER — BENAZEPRIL HCL 40 MG PO TABS: 40.0000 mg | ORAL_TABLET | Freq: Every day | ORAL | 3 refills | Status: DC

## 2016-10-18 MED ORDER — HYDROCHLOROTHIAZIDE 25 MG PO TABS: 25.0000 mg | ORAL_TABLET | Freq: Every day | ORAL | 3 refills | Status: DC

## 2016-10-18 MED ORDER — LIRAGLUTIDE 18 MG/3ML ~~LOC~~ SOPN: 1.8000 mg | PEN_INJECTOR | Freq: Every day | SUBCUTANEOUS | 3 refills | Status: DC

## 2016-10-18 NOTE — Assessment & Plan Note (Signed)
Hopefully improving

## 2016-10-18 NOTE — Assessment & Plan Note (Signed)
Discuss Will continue warfarin same dose discuss dosing and diet

## 2016-10-18 NOTE — Progress Notes (Signed)
BP (!) 149/82   Pulse 68   Ht 5\' 8"  (1.727 m)   Wt 264 lb (119.7 kg)   SpO2 97%   BMI 40.14 kg/m    Subjective:    Patient ID: Rodney CharyPaul T Labreck [name], male    DOB: 10/14/1960, 56 y.o.   MRN: [id number]  HPI: Rodney Biaul T Joiner [name] is a 56 y.o. male  Chief Complaint  Patient presents with  . Follow-up   Patient follow-up diabetes doing A1c but just got for [employer] last month will get another month under her belt to see how that's going. Patient has lost 4 pounds. No low blood sugar spells hopefully glucose is better. On chart review patient's blood pressure hasn't been good since November. Patient taking his medications faithfully with no side effects. After review with patient taking hydrochlorothiazide and carvedilol twice a day. Not taking Benzapril. Will not increase medications today will just make sure taking these medications. INR stable taking warfarin every day no bleeding bruising issues.  Relevant past medical, surgical, family and social history reviewed and updated as indicated. Interim medical history since our last visit reviewed. Allergies and medications reviewed and updated.  Review of Systems  Constitutional: Negative.   Respiratory: Negative.   Cardiovascular: Negative.     Per HPI unless specifically indicated above     Objective:    BP (!) 149/82   Pulse 68   Ht 5\' 8"  (1.727 m)   Wt 264 lb (119.7 kg)   SpO2 97%   BMI 40.14 kg/m   Wt Readings from Last 3 Encounters:  10/18/16 264 lb (119.7 kg)  09/08/16 268 lb (121.6 kg)  07/25/16 268 lb (121.6 kg)    Physical Exam  Constitutional: He is oriented to person, place, and time. He appears well-developed and well-nourished.  HENT:  Head: Normocephalic and atraumatic.  Eyes: Conjunctivae and EOM are normal.  Neck: Normal range of motion.  Cardiovascular: Normal rate, regular rhythm and normal heart sounds.   Pulmonary/Chest: Effort normal and breath sounds normal.  Musculoskeletal: Normal range of  motion.  Neurological: He is alert and oriented to person, place, and time.  Skin: No erythema.  Psychiatric: He has a normal mood and affect. His behavior is normal. Judgment and thought content normal.    Results for orders placed or performed in visit on 09/08/16  CoaguChek XS/INR Waived  Result Value Ref Range   INR 2.8 (H) 0.9 - 1.1   Prothrombin Time 33.4 sec      Assessment & Plan:   Problem List Items Addressed This Visit      Cardiovascular and Mediastinum   Chronic atrial fibrillation (HCC) - Primary    Discuss Will continue warfarin same dose discuss dosing and diet      Relevant Medications   benazepril (LOTENSIN) 40 MG tablet   carvedilol (COREG) 25 MG tablet   fenofibrate 160 MG tablet   hydrochlorothiazide (HYDRODIURIL) 25 MG tablet   Other Relevant Orders   CoaguChek XS/INR Waived   Hypertension    Chronic poor control of blood pressure  Reviewed and patient not taking Benzapril will restart Benzapril      Relevant Medications   benazepril (LOTENSIN) 40 MG tablet   carvedilol (COREG) 25 MG tablet   fenofibrate 160 MG tablet   hydrochlorothiazide (HYDRODIURIL) 25 MG tablet     Endocrine   Diabetes mellitus without complication (HCC)    Hopefully improving      Relevant Medications   benazepril (LOTENSIN) 40  MG tablet   dapagliflozin propanediol (FARXIGA) 10 MG TABS tablet   liraglutide (VICTOZA) 18 MG/3ML SOPN   metFORMIN (GLUCOPHAGE) 500 MG tablet      Also some right biceps tendon inflammation and also deltoid inflammation discuss variation of lifting techniques to help treat.  Follow up plan: Return in about 4 weeks (around 11/15/2016) for Hemoglobin A1c, BMP, PT INR, blood pressure recheck.

## 2016-10-18 NOTE — Assessment & Plan Note (Addendum)
Chronic poor control of blood pressure  Reviewed and patient not taking Benzapril will restart Benzapril

## 2016-11-15 ENCOUNTER — Ambulatory Visit (INDEPENDENT_AMBULATORY_CARE_PROVIDER_SITE_OTHER): Payer: BLUE CROSS/BLUE SHIELD | Admitting: Family Medicine

## 2016-11-15 ENCOUNTER — Encounter: Payer: Self-pay | Admitting: Family Medicine

## 2016-11-15 VITALS — BP 136/84 | HR 82 | Wt 258.0 lb

## 2016-11-15 DIAGNOSIS — I10 Essential (primary) hypertension: Secondary | ICD-10-CM | POA: Diagnosis not present

## 2016-11-15 DIAGNOSIS — I482 Chronic atrial fibrillation, unspecified: Secondary | ICD-10-CM

## 2016-11-15 DIAGNOSIS — E119 Type 2 diabetes mellitus without complications: Secondary | ICD-10-CM

## 2016-11-15 LAB — COAGUCHEK XS/INR WAIVED
INR: 2.7 — ABNORMAL HIGH (ref 0.9–1.1)
Prothrombin Time: 32.4 s

## 2016-11-15 LAB — BAYER DCA HB A1C WAIVED: HB A1C (BAYER DCA - WAIVED): 9 % — ABNORMAL HIGH (ref <7.0)

## 2016-11-15 NOTE — Assessment & Plan Note (Signed)
The current medical regimen is effective;  continue present plan and medications.  

## 2016-11-15 NOTE — Assessment & Plan Note (Signed)
Endocrinology referral next week

## 2016-11-15 NOTE — Progress Notes (Signed)
   BP 136/84   Pulse 82   Wt 258 lb (117 kg)   SpO2 94%   BMI 39.23 kg/m    Subjective:    Patient ID: Rodney CharyPaul T Bohan [name], male    DOB: [birth year], 56 y.o.   MRN: [id number]  HPI: Rodney Biaul T Arey [name] is a 56 y.o. male  Chief Complaint  Patient presents with  . Follow-up  Patient with some slight improvement in diabetes with weight loss. Patient can continue trying hemoglobin A1c has come down to 9. Patient has appointment with endocrinology next week. This is to attempt to get better control as we've been unable to affect better control for years now.  INR stable at 2.7 no bleeding bruising issues  Relevant past medical, surgical, family and social history reviewed and updated as indicated. Interim medical history since our last visit reviewed. Allergies and medications reviewed and updated.  Review of Systems  Constitutional: Negative.   Respiratory: Negative.   Cardiovascular: Negative.     Per HPI unless specifically indicated above     Objective:    BP 136/84   Pulse 82   Wt 258 lb (117 kg)   SpO2 94%   BMI 39.23 kg/m   Wt Readings from Last 3 Encounters:  11/15/16 258 lb (117 kg)  10/18/16 264 lb (119.7 kg)  09/08/16 268 lb (121.6 kg)    Physical Exam  Constitutional: He is oriented to person, place, and time. He appears well-developed and well-nourished.  HENT:  Head: Normocephalic and atraumatic.  Eyes: Conjunctivae and EOM are normal.  Neck: Normal range of motion.  Cardiovascular: Normal rate, regular rhythm and normal heart sounds.   Pulmonary/Chest: Effort normal and breath sounds normal.  Musculoskeletal: Normal range of motion.  Neurological: He is alert and oriented to person, place, and time.  Skin: No erythema.  Psychiatric: He has a normal mood and affect. His behavior is normal. Judgment and thought content normal.    Results for orders placed or performed in visit on 10/18/16  CoaguChek XS/INR Waived  Result Value Ref Range   INR 2.7 (H)  0.9 - 1.1   Prothrombin Time 32.2 sec      Assessment & Plan:   Problem List Items Addressed This Visit      Cardiovascular and Mediastinum   Chronic atrial fibrillation (HCC) - Primary    The current medical regimen is effective;  continue present plan and medications.       Relevant Orders   CoaguChek XS/INR Waived   Hypertension    The current medical regimen is effective;  continue present plan and medications.       Relevant Orders   Basic metabolic panel     Endocrine   Diabetes mellitus without complication [facility](HCC)    Endocrinology referral next week      Relevant Orders   Bayer DCA Hb A1c Waived       Follow up plan: Return in about 4 weeks (around 12/13/2016) for PT INR.

## 2016-11-16 ENCOUNTER — Encounter: Payer: Self-pay | Admitting: Family Medicine

## 2016-11-16 LAB — BASIC METABOLIC PANEL
BUN/Creatinine Ratio: 19 (ref 9–20)
BUN: 23 mg/dL (ref 6–24)
CO2: 25 mmol/L (ref 20–29)
Calcium: 9.8 mg/dL (ref 8.7–10.2)
Chloride: 96 mmol/L (ref 96–106)
Creatinine, Ser: 1.21 mg/dL (ref 0.76–1.27)
GFR calc Af Amer: 77 mL/min/{1.73_m2} (ref >59)
GFR calc non Af Amer: 67 mL/min/{1.73_m2} (ref >59)
Glucose: 221 mg/dL — ABNORMAL HIGH (ref 65–99)
Potassium: 3.7 mmol/L (ref 3.5–5.2)
Sodium: 138 mmol/L (ref 134–144)

## 2016-12-18 ENCOUNTER — Other Ambulatory Visit: Payer: Self-pay | Admitting: Family Medicine

## 2016-12-18 DIAGNOSIS — I482 Chronic atrial fibrillation, unspecified: Secondary | ICD-10-CM

## 2016-12-21 ENCOUNTER — Ambulatory Visit (INDEPENDENT_AMBULATORY_CARE_PROVIDER_SITE_OTHER): Payer: BLUE CROSS/BLUE SHIELD | Admitting: Family Medicine

## 2016-12-21 ENCOUNTER — Encounter: Payer: Self-pay | Admitting: Family Medicine

## 2016-12-21 VITALS — BP 136/88 | HR 81 | Wt 254.0 lb

## 2016-12-21 DIAGNOSIS — I482 Chronic atrial fibrillation, unspecified: Secondary | ICD-10-CM

## 2016-12-21 NOTE — Assessment & Plan Note (Signed)
The current medical regimen is effective;  continue present plan and medications.  

## 2016-12-21 NOTE — Progress Notes (Signed)
   BP 136/88   Pulse 81   Wt 254 lb (115.2 kg)   SpO2 92%   BMI 38.62 kg/m    Subjective:    Patient ID: Rodney CharyPaul T Swigart [name], male    DOB: January 21, 1961, 56 y.o.   MRN: [id number]  HPI: Rodney Biaul T Chrostowski [name] is a 56 y.o. male  Chief Complaint  Patient presents with  . Follow-up  . Atrial Fibrillation  Follow-up doing well with warfarin no complaints no bleeding bruising issues.  Relevant past medical, surgical, family and social history reviewed and updated as indicated. Interim medical history since our last visit reviewed. Allergies and medications reviewed and updated.  Review of Systems  Per HPI unless specifically indicated above     Objective:    BP 136/88   Pulse 81   Wt 254 lb (115.2 kg)   SpO2 92%   BMI 38.62 kg/m   Wt Readings from Last 3 Encounters:  12/21/16 254 lb (115.2 kg)  11/15/16 258 lb (117 kg)  10/18/16 264 lb (119.7 kg)    Physical Exam  Constitutional: He is oriented to person, place, and time. He appears well-developed and well-nourished.  HENT:  Head: Normocephalic and atraumatic.  Eyes: Conjunctivae and EOM are normal.  Neck: Normal range of motion.  Cardiovascular: Normal rate, regular rhythm and normal heart sounds.   Pulmonary/Chest: Effort normal and breath sounds normal.  Musculoskeletal: Normal range of motion.  Neurological: He is alert and oriented to person, place, and time.  Skin: No erythema.  Psychiatric: He has a normal mood and affect. His behavior is normal. Judgment and thought content normal.    Results for orders placed or performed in visit on 11/15/16  Basic metabolic panel  Result Value Ref Range   Glucose 221 (H) 65 - 99 mg/dL   BUN 23 6 - 24 mg/dL   Creatinine, Ser 4.091.21 0.76 - 1.27 mg/dL   GFR calc non Af Amer 67 >59 mL/min/1.73   GFR calc Af Amer 77 >59 mL/min/1.73   BUN/Creatinine Ratio 19 9 - 20   Sodium 138 134 - 144 mmol/L   Potassium 3.7 3.5 - 5.2 mmol/L   Chloride 96 96 - 106 mmol/L   CO2 25 20 - 29  mmol/L   Calcium 9.8 8.7 - 10.2 mg/dL  CoaguChek XS/INR Waived  Result Value Ref Range   INR 2.7 (H) 0.9 - 1.1   Prothrombin Time 32.4 sec  Bayer DCA Hb A1c Waived  Result Value Ref Range   Bayer DCA Hb A1c Waived 9.0 (H) <7.0 %      Assessment & Plan:   Problem List Items Addressed This Visit      Cardiovascular and Mediastinum   Chronic atrial fibrillation (HCC) - Primary    The current medical regimen is effective;  continue present plan and medications.       Relevant Orders   CoaguChek XS/INR Waived       Follow up plan: Return in about 4 weeks (around 01/18/2017) for PT INR.

## 2016-12-22 LAB — COAGUCHEK XS/INR WAIVED
INR: 3.2 — ABNORMAL HIGH (ref 0.9–1.1)
Prothrombin Time: 38.9 s

## 2017-02-05 ENCOUNTER — Emergency Department: Payer: BLUE CROSS/BLUE SHIELD

## 2017-02-05 ENCOUNTER — Emergency Department
Admission: EM | Admit: 2017-02-05 | Discharge: 2017-02-05 | Disposition: A | Discharge status: Home / Self Care | Payer: BLUE CROSS/BLUE SHIELD | Attending: Emergency Medicine | Admitting: Emergency Medicine

## 2017-02-05 DIAGNOSIS — Y9389 Activity, other specified: Secondary | ICD-10-CM | POA: Diagnosis not present

## 2017-02-05 DIAGNOSIS — Y998 Other external cause status: Secondary | ICD-10-CM | POA: Diagnosis not present

## 2017-02-05 DIAGNOSIS — Y33XXXA Other specified events, undetermined intent, initial encounter: Secondary | ICD-10-CM | POA: Insufficient documentation

## 2017-02-05 DIAGNOSIS — Z794 Long term (current) use of insulin: Secondary | ICD-10-CM | POA: Diagnosis not present

## 2017-02-05 DIAGNOSIS — S299XXA Unspecified injury of thorax, initial encounter: Secondary | ICD-10-CM | POA: Diagnosis present

## 2017-02-05 DIAGNOSIS — E785 Hyperlipidemia, unspecified: Secondary | ICD-10-CM | POA: Diagnosis not present

## 2017-02-05 DIAGNOSIS — Z79899 Other long term (current) drug therapy: Secondary | ICD-10-CM | POA: Insufficient documentation

## 2017-02-05 DIAGNOSIS — I10 Essential (primary) hypertension: Secondary | ICD-10-CM | POA: Diagnosis not present

## 2017-02-05 DIAGNOSIS — Y9289 Other specified places as the place of occurrence of the external cause: Secondary | ICD-10-CM | POA: Diagnosis not present

## 2017-02-05 DIAGNOSIS — E119 Type 2 diabetes mellitus without complications: Secondary | ICD-10-CM | POA: Diagnosis not present

## 2017-02-05 DIAGNOSIS — S20212A Contusion of left front wall of thorax, initial encounter: Secondary | ICD-10-CM | POA: Diagnosis not present

## 2017-02-05 MED ORDER — NAPROXEN 500 MG PO TABS: 500.0000 mg | ORAL_TABLET | Freq: Two times a day (BID) | ORAL | 0 refills | Status: DC

## 2017-02-05 MED ORDER — HYDROCODONE-ACETAMINOPHEN 5-325 MG PO TABS: 1.0000 | ORAL_TABLET | Freq: Four times a day (QID) | ORAL | 0 refills | Status: DC | PRN

## 2017-02-05 NOTE — ED Triage Notes (Signed)
Pt states he was helping his son repair his well about 3-4 days ago and was laying across the door way and felt something shift in his left rib and has had pain since.Marland Kitchen

## 2017-02-05 NOTE — Discharge Instructions (Signed)
Follow-up with your primary care doctor if any continued problems. Beginning taking naproxen 500 mg twice a day with food. Norco as needed for moderate to severe pain 1 every 6 hours. You may also just take this at bedtime if needed for pain relief. You may also use ice to your ribs as needed.

## 2017-02-05 NOTE — ED Provider Notes (Signed)
[facility] Emergency Department Provider Note  ____________________________________________   First MD Initiated Contact with Patient 02/05/17 [phone]     (approximate)  I have reviewed the triage vital signs and the nursing notes.   HISTORY  Chief Complaint Rib Injury   HPI Rodney Dixon [name] is a 56 y.o. male is here with complaint of left rib pain.Patient states approximately 3-4 days ago he was helping his son repair his well. Patient states that he was lying across the doorway when he began feeling pain in his rib. He states that something "shifted" and he is continued to have pain since that time. Pain is increased with deep inspiration or coughing. Patient denies any previous injury to his ribs. He has been taking over-the-counter medication without any relief. Currently rates pain is 7 out of 10.   Past Medical History:  Diagnosis Date  . Allergy   . Diabetes mellitus without complication (HCC)   . Hyperlipidemia   . Hypertension     Patient Active Problem List   Diagnosis Date Noted  . Fatigue 08/06/2015  . Hyperlipidemia 04/15/2015  . S/P aortic valve replacement with metallic valve 01/29/2015  . Chronic atrial fibrillation (HCC) 11/24/2014  . Hypertension 11/24/2014  . Diabetes mellitus without complication (HCC) 11/24/2014    Past Surgical History:  Procedure Laterality Date  . CARDIAC VALVE REPLACEMENT      Prior to Admission medications   Medication Sig Start Date End Date Taking? Authorizing Provider  benazepril (LOTENSIN) 40 MG tablet Take 1 tablet (40 mg total) by mouth daily. 10/18/16   Steele Sizer, MD  carvedilol (COREG) 25 MG tablet Take 1 tablet (25 mg total) by mouth 2 (two) times daily with a meal. 10/18/16   Crissman, Redge Gainer, MD  dapagliflozin propanediol (FARXIGA) 10 MG TABS tablet Take 10 mg by mouth daily. 10/18/16   Steele Sizer, MD  fenofibrate 160 MG tablet Take 1 tablet (160 mg total) by mouth daily. 10/18/16    Steele Sizer, MD  gabapentin (NEURONTIN) 300 MG capsule Take 2 capsules (600 mg total) by mouth 2 (two) times daily. 10/18/16   Steele Sizer, MD  hydrochlorothiazide (HYDRODIURIL) 25 MG tablet Take 1 tablet (25 mg total) by mouth daily. 10/18/16   Steele Sizer, MD  HYDROcodone-acetaminophen (NORCO/VICODIN) 5-325 MG tablet Take 1 tablet by mouth every 6 (six) hours as needed for severe pain. 02/05/17   Tommi Rumps, PA-C  Insulin Glargine (TOUJEO SOLOSTAR) 300 UNIT/ML SOPN Inject 85 Units into the skin daily. 07/06/16   Steele Sizer, MD  liraglutide (VICTOZA) 18 MG/3ML SOPN Inject 0.3 mLs (1.8 mg total) into the skin daily. 10/18/16   Steele Sizer, MD  metFORMIN (GLUCOPHAGE) 500 MG tablet TAKE 2 TABLETS TWICE EACH DAY ORAL 10/18/16   Steele Sizer, MD  naproxen (NAPROSYN) 500 MG tablet Take 1 tablet (500 mg total) by mouth 2 (two) times daily with a meal. 02/05/17   Bridget Hartshorn L, PA-C  warfarin (COUMADIN) 4 MG tablet TAKE 1 TABLET (4 MG TOTAL) BY MOUTH DAILY. 12/19/16   Steele Sizer, MD  warfarin (COUMADIN) 5 MG tablet TAKE 1 TABLET (5 MG TOTAL) BY MOUTH DAILY. 08/23/16   Steele Sizer, MD    Allergies Patient has no known allergies.  Family History  Problem Relation Age of Onset  . Diabetes Mother   . Heart disease Mother   . Hyperlipidemia Mother   . Hypertension Mother  Social History Social History  Substance Use Topics  . Smoking status: Never Smoker  . Smokeless tobacco: Never Used  . Alcohol use No    Review of Systems Constitutional: No fever/chills Cardiovascular: Denies chest pain. Respiratory: Denies shortness of breath. Gastrointestinal: No abdominal pain.  No nausea, no vomiting.  Musculoskeletal: Positive for left rib pain. Skin: Negative for injury. Neurological: Negative for headaches, focal weakness or numbness. ____________________________________________   PHYSICAL EXAM:  VITAL SIGNS: ED Triage Vitals [02/05/17 0822]    Enc Vitals Group     BP 134/81     Pulse Rate 81     Resp 18     Temp 97.7 F (36.5 C)     Temp Source Oral     SpO2 95 %     Weight 260 lb (117.9 kg)     Height  (1.727 m)     Head Circumference      Peak Flow      Pain Score 7     Pain Loc      Pain Edu?      Excl. in GC?    Constitutional: Alert and oriented. Well appearing and in no acute distress. Eyes: Conjunctivae are normal.  Head: Atraumatic. Nose: No congestion/rhinnorhea. Mouth/Throat: Mucous membranes are moist.  Oropharynx non-erythematous. Neck: No stridor.   Cardiovascular: Normal rate, regular rhythm. Grossly normal heart sounds.  Good peripheral circulation. Respiratory: Normal respiratory effort.  No retractions. Lungs CTAB.  Moderate tenderness on palpation of the left lateral ribs without gross deformity. No ecchymosis or soft tissue swelling present. Gastrointestinal: Soft and nontender. No distention.  Musculoskeletal: Moves upper and lower extremities without any difficulty. Normal gait was noted. Neurologic:  Normal speech and language. No gross focal neurologic deficits are appreciated.  Skin:  Skin is warm, dry and intact. Psychiatric: Mood and affect are normal. Speech and behavior are normal.  ____________________________________________   LABS (all labs ordered are listed, but only abnormal results are displayed)  Labs Reviewed - No data to display  RADIOLOGY  Dg Ribs Unilateral W/chest Left  Result Date: 02/05/2017 CLINICAL DATA:  Left axillary rib pain for 3-4 days. EXAM: LEFT RIBS AND CHEST - 3+ VIEW COMPARISON:  Chest radiograph 01/24/2005 FINDINGS: Sequelae of prior median sternotomy are again identified. The third most superior sternal wire is fractured. The cardiomediastinal silhouette is within normal limits. The lungs are clear. No pleural effusion or pneumothorax is identified. No rib fracture is identified. IMPRESSION: No rib fracture identified. Electronically Signed   By:  Sebastian Ache M.D.   On: 02/05/2017 09:29    ____________________________________________   PROCEDURES  Procedure(s) performed: None  Procedures  Critical Care performed: No  ____________________________________________   INITIAL IMPRESSION / ASSESSMENT AND PLAN / ED COURSE  Pertinent labs & imaging results that were available during my care of the patient were reviewed by me and considered in my medical decision making (see chart for details).  Patient was made aware of the chest x-ray did not show any fractures. Patient was given a prescription for Norco as needed for severe pain and naproxen 500 mg twice a day with food. Patient is follow-up with his PCP if any continued problems. He is encouraged to use ice to his ribs and was made aware that he most likely will have rib pain for at least another 4-5 days.   ___________________________________________   FINAL CLINICAL IMPRESSION(S) / ED DIAGNOSES  Final diagnoses:  Rib contusion, left, initial encounter  NEW MEDICATIONS STARTED DURING THIS VISIT:  Discharge Medication List as of 02/05/2017  9:43 AM    START taking these medications   Details  HYDROcodone-acetaminophen (NORCO/VICODIN) 5-325 MG tablet Take 1 tablet by mouth every 6 (six) hours as needed for severe pain., Starting Sun 02/05/2017, Print    naproxen (NAPROSYN) 500 MG tablet Take 1 tablet (500 mg total) by mouth 2 (two) times daily with a meal., Starting Sun 02/05/2017, Print         Note:  This document was prepared using Dragon voice recognition software and may include unintentional dictation errors.    Tommi Rumps, PA-C 02/05/17 1108    Minna Antis, MD 02/05/17 1334

## 2017-02-14 ENCOUNTER — Other Ambulatory Visit: Payer: Self-pay | Admitting: Family Medicine

## 2017-02-14 DIAGNOSIS — I482 Chronic atrial fibrillation, unspecified: Secondary | ICD-10-CM

## 2017-02-16 ENCOUNTER — Ambulatory Visit (INDEPENDENT_AMBULATORY_CARE_PROVIDER_SITE_OTHER): Payer: BLUE CROSS/BLUE SHIELD | Admitting: Family Medicine

## 2017-02-16 DIAGNOSIS — E119 Type 2 diabetes mellitus without complications: Secondary | ICD-10-CM

## 2017-02-16 DIAGNOSIS — I482 Chronic atrial fibrillation, unspecified: Secondary | ICD-10-CM

## 2017-02-16 DIAGNOSIS — Z954 Presence of other heart-valve replacement: Secondary | ICD-10-CM | POA: Diagnosis not present

## 2017-02-16 LAB — COAGUCHEK XS/INR WAIVED
INR: 2.5 — ABNORMAL HIGH (ref 0.9–1.1)
Prothrombin Time: 30.2 s

## 2017-02-16 LAB — BAYER DCA HB A1C WAIVED: HB A1C (BAYER DCA - WAIVED): 11.1 % — ABNORMAL HIGH (ref <7.0)

## 2017-02-16 MED ORDER — SEMAGLUTIDE (1 MG/DOSE) 2 MG/1.5ML ~~LOC~~ SOPN: 1.0000 mg | PEN_INJECTOR | SUBCUTANEOUS | 12 refills | Status: DC

## 2017-02-16 NOTE — Assessment & Plan Note (Addendum)
Diabetes continued poor control discussed endocrinology referral patient declining saying he knows what to do just not doing it not taking [place] are Victoza. Reviewed again importance of medication gave coupons for Farxiga and gave prescription for Ozempic patient gave first dose here in the office with no problems. Patient will titrate upward monthly 0.25-0.5-1 mg  Discuss again the dangers of uncontrolled diabetes and very serious risk he is taking.

## 2017-02-16 NOTE — Assessment & Plan Note (Signed)
Anticoagulation with INR of 2.5 doing well with no complaints will continue Coumadin.

## 2017-02-16 NOTE — Progress Notes (Signed)
   There were no vitals taken for this visit.   Subjective:    Patient ID: Rodney Chary [name], male    DOB: June 19, 1960, 56 y.o.   MRN: [id number]  HPI: Rodney Dixon. is a 56 y.o. male  Chief Complaint  Patient presents with  . Coagulation Disorder  Taking warfarin with no problems and faithfully. No bleeding bruising issues or other concerns from warfarin. Patient due diabetes check not doing anything with diet nutrition exercise. Patient owns a doughnut shop. Not taking for Farxiga or Victoza.    Relevant past medical, surgical, family and social history reviewed and updated as indicated. Interim medical history since our last visit reviewed. Allergies and medications reviewed and updated.  Review of Systems  Constitutional: Negative.   Respiratory: Negative.   Cardiovascular: Negative.     Per HPI unless specifically indicated above     Objective:    There were no vitals taken for this visit.  Wt Readings from Last 3 Encounters:  02/05/17 260 lb (117.9 kg)  12/21/16 254 lb (115.2 kg)  11/15/16 258 lb (117 kg)    Physical Exam  Constitutional: He is oriented to person, place, and time. He appears well-developed and well-nourished.  HENT:  Head: Normocephalic and atraumatic.  Eyes: Conjunctivae and EOM are normal.  Neck: Normal range of motion.  Cardiovascular: Normal rate, regular rhythm and normal heart sounds.   Pulmonary/Chest: Effort normal and breath sounds normal.  Musculoskeletal: Normal range of motion.  Neurological: He is alert and oriented to person, place, and time.  Skin: No erythema.  Psychiatric: He has a normal mood and affect. His behavior is normal. Judgment and thought content normal.    Results for orders placed or performed in visit on 12/21/16  CoaguChek XS/INR Waived  Result Value Ref Range   INR 3.2 (H) 0.9 - 1.1   Prothrombin Time 38.9 sec      Assessment & Plan:   Problem List Items Addressed This Visit      Cardiovascular  and Mediastinum   Chronic atrial fibrillation (HCC)    Anticoagulation with INR of 2.5 doing well with no complaints will continue Coumadin.      Relevant Orders   CoaguChek XS/INR Waived     Endocrine   Diabetes mellitus without complication (HCC) - Primary    Diabetes continued poor control discussed endocrinology referral patient declining saying he knows what to do just not doing it not taking [place] are Victoza. Reviewed again importance of medication gave coupons for Farxiga and gave prescription for Ozempic patient gave first dose here in the office with no problems. Patient will titrate upward monthly 0.25-0.5-1 mg  Discuss again the dangers of uncontrolled diabetes and very serious risk he is taking.      Relevant Medications   Semaglutide (OZEMPIC) 1 MG/DOSE SOPN   Other Relevant Orders   Bayer DCA Hb A1c Waived     Other   S/P aortic valve replacement with metallic valve   Relevant Orders   CoaguChek XS/INR Waived       Follow up plan: Return in about 4 weeks (around 03/16/2017) for PT INR and diabetes check.Marland Kitchen

## 2017-02-23 ENCOUNTER — Encounter: Payer: Self-pay | Admitting: Family Medicine

## 2017-02-23 ENCOUNTER — Ambulatory Visit (INDEPENDENT_AMBULATORY_CARE_PROVIDER_SITE_OTHER): Payer: BLUE CROSS/BLUE SHIELD | Admitting: Family Medicine

## 2017-02-23 VITALS — BP 145/89 | HR 91 | Temp 98.3°F | Wt 264.0 lb

## 2017-02-23 DIAGNOSIS — S20212D Contusion of left front wall of thorax, subsequent encounter: Secondary | ICD-10-CM

## 2017-02-23 MED ORDER — HYDROCODONE-ACETAMINOPHEN 5-325 MG PO TABS: 1.0000 | ORAL_TABLET | Freq: Four times a day (QID) | ORAL | 0 refills | Status: DC | PRN

## 2017-02-23 NOTE — Progress Notes (Signed)
   BP (!) 145/89   Pulse 91   Temp 98.3 F (36.8 C)   Wt 264 lb (119.7 kg)   SpO2 94%   BMI 40.14 kg/m    Subjective:    Patient ID: Rodney CharyPaul T Garlitz [name], male    DOB: November 29, 1960, 56 y.o.   MRN: [id number]  HPI: Rodney Biaul T Villacres [name] is a 56 y.o. male  Chief Complaint  Patient presents with  . Abdominal Pain    few weeks ago was doing some work and hurt the left side of his stomach. Went to ED and told he had a contusion was given Naproxen and Hydrocodone. Dr.C told him to stop taking the Naproxen due to his Coumadin. Pain has gotten better at times but now hurting him more.   Patient presents following up on left rib contusion. Was seen first in ER and then at regular f/u here last week. Was given naproxen and hydrocodone in ER which helped some, but was told Stopped naproxen d/t bleed risk with coumadin and notes that since stopping that the pain is flaring back up. Taking hydrocodone prn at bedtime but was only given 6 in ER and has run out. Notes better sleep quality if he does this d/t not waking up with pain as much. No change in sxs, abdominal pain, N/V/D, fevers, bruising. Taking tylenol with very mild relief.   Relevant past medical, surgical, family and social history reviewed and updated as indicated. Interim medical history since our last visit reviewed. Allergies and medications reviewed and updated.  Review of Systems  Constitutional: Negative.   Respiratory: Negative.   Cardiovascular: Negative.   Gastrointestinal: Negative.   Musculoskeletal: Positive for arthralgias (left rib pain).  Neurological: Negative.   Psychiatric/Behavioral: Negative.    Per HPI unless specifically indicated above     Objective:    BP (!) 145/89   Pulse 91   Temp 98.3 F (36.8 C)   Wt 264 lb (119.7 kg)   SpO2 94%   BMI 40.14 kg/m   Wt Readings from Last 3 Encounters:  02/23/17 264 lb (119.7 kg)  02/05/17 260 lb (117.9 kg)  12/21/16 254 lb (115.2 kg)    Physical Exam    Constitutional: He is oriented to person, place, and time. He appears well-developed and well-nourished. No distress.  HENT:  Head: Atraumatic.  Eyes: Pupils are equal, round, and reactive to light. Conjunctivae are normal.  Neck: Normal range of motion. Neck supple.  Cardiovascular: Normal rate.   Pulmonary/Chest: Effort normal and breath sounds normal.  Abdominal: Soft. Bowel sounds are normal. He exhibits no distension. There is no tenderness.  Musculoskeletal: Normal range of motion. He exhibits tenderness (ttp over left lower ribs).  Neurological: He is alert and oriented to person, place, and time.  Skin: Skin is warm and dry.  Psychiatric: He has a normal mood and affect. His behavior is normal.  Nursing note and vitals reviewed.     Assessment & Plan:   Problem List Items Addressed This Visit    None    Visit Diagnoses    Rib contusion, left, subsequent encounter    -  Primary   Small supply of hydrocodone given. Continue tylenol prn, start lidoderm patches daily to supplement pain medication. Rest, heat/ice to area.        Follow up plan: Return if symptoms worsen or fail to improve.

## 2017-02-23 NOTE — Patient Instructions (Signed)
Follow up as needed

## 2017-03-20 ENCOUNTER — Encounter: Payer: Self-pay | Admitting: Family Medicine

## 2017-03-20 ENCOUNTER — Ambulatory Visit: Payer: BLUE CROSS/BLUE SHIELD | Admitting: Family Medicine

## 2017-03-20 VITALS — BP 120/81 | HR 96 | Temp 98.4°F | Wt 264.6 lb

## 2017-03-20 DIAGNOSIS — E785 Hyperlipidemia, unspecified: Secondary | ICD-10-CM | POA: Diagnosis not present

## 2017-03-20 DIAGNOSIS — E119 Type 2 diabetes mellitus without complications: Secondary | ICD-10-CM | POA: Diagnosis not present

## 2017-03-20 DIAGNOSIS — I10 Essential (primary) hypertension: Secondary | ICD-10-CM

## 2017-03-20 DIAGNOSIS — Z954 Presence of other heart-valve replacement: Secondary | ICD-10-CM

## 2017-03-20 DIAGNOSIS — I482 Chronic atrial fibrillation, unspecified: Secondary | ICD-10-CM

## 2017-03-20 LAB — COAGUCHEK XS/INR WAIVED
INR: 4.3 — ABNORMAL HIGH (ref 0.9–1.1)
Prothrombin Time: 51.8 s

## 2017-03-20 NOTE — Progress Notes (Signed)
BP 120/81 (BP Location: Left Arm, Patient Position: Sitting, Cuff Size: Large)   Pulse 96   Temp 98.4 F (36.9 C) (Temporal)   Wt 264 lb 9.6 oz (120 kg)   SpO2 95%   BMI 40.23 kg/m    Subjective:    Patient ID: Rodney CharyPaul T Cass [name], male    DOB: 07-Oct-1960, 56 y.o.   MRN: [id number]  HPI: Rodney Biaul T Ellegood [name] is a 56 y.o. male  Chief Complaint  Patient presents with  . Coagulation Disorder  . Diabetes  patient now taking Ozempic and Farxiga without problems no low blood sugar spells. Has not changed anything with taking warfarin continuing to take same dose may be diet a little less green stuff.  Relevant past medical, surgical, family and social history reviewed and updated as indicated. Interim medical history since our last visit reviewed. Allergies and medications reviewed and updated.  Review of Systems  Constitutional: Negative.   Respiratory: Negative.   Cardiovascular: Negative.     Per HPI unless specifically indicated above     Objective:    BP 120/81 (BP Location: Left Arm, Patient Position: Sitting, Cuff Size: Large)   Pulse 96   Temp 98.4 F (36.9 C) (Temporal)   Wt 264 lb 9.6 oz (120 kg)   SpO2 95%   BMI 40.23 kg/m   Wt Readings from Last 3 Encounters:  03/20/17 264 lb 9.6 oz (120 kg)  02/23/17 264 lb (119.7 kg)  02/05/17 260 lb (117.9 kg)    Physical Exam  Constitutional: He is oriented to person, place, and time. He appears well-developed and well-nourished.  HENT:  Head: Normocephalic and atraumatic.  Eyes: Conjunctivae and EOM are normal.  Neck: Normal range of motion.  Cardiovascular: Normal rate, regular rhythm and normal heart sounds.  Pulmonary/Chest: Effort normal and breath sounds normal.  Musculoskeletal: Normal range of motion.  Neurological: He is alert and oriented to person, place, and time.  Skin: No erythema.  Psychiatric: He has a normal mood and affect. His behavior is normal. Judgment and thought content normal.    Results  for orders placed or performed in visit on 02/16/17  CoaguChek XS/INR Waived  Result Value Ref Range   INR 2.5 (H) 0.9 - 1.1   Prothrombin Time 30.2 sec  Bayer DCA Hb A1c Waived  Result Value Ref Range   Bayer DCA Hb A1c Waived 11.1 (H) <7.0 %      Assessment & Plan:   Problem List Items Addressed This Visit      Cardiovascular and Mediastinum   Chronic atrial fibrillation (HCC) - Primary   Relevant Orders   CoaguChek XS/INR Waived   Hypertension   Relevant Orders   CoaguChek XS/INR Waived     Endocrine   Diabetes mellitus without complication (HCC)    Discussed diabetes patient doing better with that indication noticing lower blood sugar improved diabetes control. Patient will continue to work on diet and weight loss      Relevant Orders   CoaguChek XS/INR Waived     Other   S/P aortic valve replacement with metallic valve    Goal INR between 2-1/2 and 3-1/2 with INR today of 4.3. Last month INR was 2.5 with no change in medication dose taking. Maybe a little less greens. We'll continue current dosing hold  Warfarin tonight eat more greens and recheck INR 2 weeks.      Hyperlipidemia   Relevant Orders   CoaguChek XS/INR [employer]Waived  Follow up plan: Return in about 2 weeks (around 04/03/2017) for PT INR.

## 2017-03-20 NOTE — Assessment & Plan Note (Signed)
Discussed diabetes patient doing better with that indication noticing lower blood sugar improved diabetes control. Patient will continue to work on diet and weight loss

## 2017-03-20 NOTE — Assessment & Plan Note (Signed)
Goal INR between 2-1/2 and 3-1/2 with INR today of 4.3. Last month INR was 2.5 with no change in medication dose taking. Maybe a little less greens. We'll continue current dosing hold  Warfarin tonight eat more greens and recheck INR 2 weeks.

## 2017-04-04 ENCOUNTER — Encounter: Payer: Self-pay | Admitting: Family Medicine

## 2017-04-04 ENCOUNTER — Ambulatory Visit: Payer: BLUE CROSS/BLUE SHIELD | Admitting: Family Medicine

## 2017-04-04 VITALS — BP 138/88 | HR 88 | Wt 259.0 lb

## 2017-04-04 DIAGNOSIS — I482 Chronic atrial fibrillation, unspecified: Secondary | ICD-10-CM

## 2017-04-04 DIAGNOSIS — E119 Type 2 diabetes mellitus without complications: Secondary | ICD-10-CM | POA: Diagnosis not present

## 2017-04-04 DIAGNOSIS — Z954 Presence of other heart-valve replacement: Secondary | ICD-10-CM | POA: Diagnosis not present

## 2017-04-04 LAB — COAGUCHEK XS/INR WAIVED
INR: 3.1 — ABNORMAL HIGH (ref 0.9–1.1)
Prothrombin Time: 37.2 s

## 2017-04-04 NOTE — Progress Notes (Signed)
   BP 138/88 (BP Location: Left Arm)   Pulse 88   Wt 259 lb (117.5 kg)   SpO2 98%   BMI 39.38 kg/m    Subjective:    Patient ID: Rodney CharyPaul T Housey [name], male    DOB: 06/10/1960, 56 y.o.   MRN: [id number]  HPI: Rodney Biaul T Tomas [name] is a 56 y.o. male  For recheck INR and atrial fibrillation, diabetes. No low blood sugar spells not do INR until January. 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  For now's not interested in further evaluation. Relevant past medical, surgical, family and social history reviewed and updated as indicated. Interim medical history since our last visit reviewed. Allergies and medications reviewed and updated.  Review of Systems  Constitutional: Negative.   Respiratory: Negative.   Cardiovascular: Negative.     Per HPI unless specifically indicated above     Objective:    BP 138/88 (BP Location: Left Arm)   Pulse 88   Wt 259 lb (117.5 kg)   SpO2 98%   BMI 39.38 kg/m   Wt Readings from Last 3 Encounters:  04/04/17 259 lb (117.5 kg)  03/20/17 264 lb 9.6 oz (120 kg)  02/23/17 264 lb (119.7 kg)    Physical Exam  Constitutional: He is oriented to person, place, and time. He appears well-developed and well-nourished.  HENT:  Head: Normocephalic and atraumatic.  Eyes: Conjunctivae and EOM are normal.  Neck: Normal range of motion.  Cardiovascular: Normal rate, regular rhythm and normal heart sounds.  Pulmonary/Chest: Effort normal and breath sounds normal.  Musculoskeletal: Normal range of motion.  Neurological: He is alert and oriented to person, place, and time.  Skin: No erythema.  Psychiatric: He has a normal mood and affect. His behavior is normal. Judgment and thought content normal.    Results for orders placed or performed in visit on 03/20/17  CoaguChek XS/INR Waived  Result Value Ref Range   INR 4.3 (H) 0.9 - 1.1   Prothrombin Time 51.8 sec      Assessment & Plan:   Problem List Items Addressed This Visit      Cardiovascular and Mediastinum   Chronic atrial fibrillation (HCC)    The current medical regimen is effective;  continue present plan and medications.         Endocrine   Diabetes mellitus without complication (HCC)    The current medical regimen is effective;  continue present plan and medications.         Other   S/P aortic valve replacement with metallic valve - Primary    The current medical regimen is effective;  continue present plan and medications.       Relevant Orders   CoaguChek XS/INR Waived       Follow up plan: Return in about 4 weeks (around 05/02/2017) for PT INR.

## 2017-04-04 NOTE — Assessment & Plan Note (Signed)
The current medical regimen is effective;  continue present plan and medications.  

## 2017-04-26 ENCOUNTER — Ambulatory Visit: Payer: BLUE CROSS/BLUE SHIELD | Admitting: Family Medicine

## 2017-04-26 ENCOUNTER — Encounter: Payer: Self-pay | Admitting: Family Medicine

## 2017-04-26 VITALS — BP 136/80 | HR 101 | Wt 252.0 lb

## 2017-04-26 DIAGNOSIS — I482 Chronic atrial fibrillation, unspecified: Secondary | ICD-10-CM

## 2017-04-26 DIAGNOSIS — E119 Type 2 diabetes mellitus without complications: Secondary | ICD-10-CM

## 2017-04-26 DIAGNOSIS — I10 Essential (primary) hypertension: Secondary | ICD-10-CM | POA: Diagnosis not present

## 2017-04-26 LAB — COAGUCHEK XS/INR WAIVED
INR: 2.5 — ABNORMAL HIGH (ref 0.9–1.1)
Prothrombin Time: 30.1 s

## 2017-04-26 NOTE — Progress Notes (Signed)
   BP 136/80 (BP Location: Left Arm)   Pulse (!) 101   Wt 252 lb (114.3 kg)   SpO2 98%   BMI 38.32 kg/m    Subjective:    Patient ID: Rodney CharyPaul T Cumbie [name], male    DOB: 1960/09/30, 56 y.o.   MRN: [id number]  HPI: Rodney Biaul T Sager [name] is a 56 y.o. male  Chief Complaint  Patient presents with  . Follow-up   PT/INR no bleeding bruising issues patient having some struggles with diabetes medications. Having problems with controlling eating and weight. Some right shoulder pain discomfortfrom doing a lot of heavy lifting over this Christmas season. Relevant past medical, surgical, family and social history reviewed and updated as indicated. Interim medical history since our last visit reviewed. Allergies and medications reviewed and updated.  Review of Systems  Constitutional: Negative.   Respiratory: Negative.   Cardiovascular: Negative.     Per HPI unless specifically indicated above     Objective:    BP 136/80 (BP Location: Left Arm)   Pulse (!) 101   Wt 252 lb (114.3 kg)   SpO2 98%   BMI 38.32 kg/m   Wt Readings from Last 3 Encounters:  04/26/17 252 lb (114.3 kg)  04/04/17 259 lb (117.5 kg)  03/20/17 264 lb 9.6 oz (120 kg)    Physical Exam  Constitutional: He is oriented to person, place, and time. He appears well-developed and well-nourished.  HENT:  Head: Normocephalic and atraumatic.  Eyes: Conjunctivae and EOM are normal.  Neck: Normal range of motion.  Cardiovascular: Normal rate, regular rhythm and normal heart sounds.  Pulmonary/Chest: Effort normal and breath sounds normal.  Musculoskeletal: Normal range of motion.  Neurological: He is alert and oriented to person, place, and time.  Skin: No erythema.  Psychiatric: He has a normal mood and affect. His behavior is normal. Judgment and thought content normal.    Results for orders placed or performed in visit on 04/26/17  CoaguChek XS/INR Waived  Result Value Ref Range   INR 2.5 (H) 0.9 - 1.1   Prothrombin  Time 30.1 sec      Assessment & Plan:   Problem List Items Addressed This Visit      Cardiovascular and Mediastinum   Chronic atrial fibrillation (HCC) - Primary    The current medical regimen is effective;  continue present plan and medications.       Relevant Orders   CoaguChek XS/INR Waived (Completed)   Hypertension    The current medical regimen is effective;  continue present plan and medications.         Endocrine   Diabetes mellitus without complication (HCC)    The current medical regimen is effective;  continue present plan and medications.           Follow up plan: Return in about 4 weeks (around 05/24/2017) for Hemoglobin A1c, PT INR.

## 2017-04-26 NOTE — Assessment & Plan Note (Signed)
The current medical regimen is effective;  continue present plan and medications.  

## 2017-04-27 ENCOUNTER — Ambulatory Visit: Payer: BLUE CROSS/BLUE SHIELD | Admitting: Family Medicine

## 2017-05-30 ENCOUNTER — Ambulatory Visit: Payer: BLUE CROSS/BLUE SHIELD | Admitting: Family Medicine

## 2017-05-30 VITALS — BP 132/84 | HR 107 | Wt 240.0 lb

## 2017-05-30 DIAGNOSIS — F321 Major depressive disorder, single episode, moderate: Secondary | ICD-10-CM | POA: Insufficient documentation

## 2017-05-30 DIAGNOSIS — I482 Chronic atrial fibrillation, unspecified: Secondary | ICD-10-CM

## 2017-05-30 DIAGNOSIS — E785 Hyperlipidemia, unspecified: Secondary | ICD-10-CM

## 2017-05-30 DIAGNOSIS — I10 Essential (primary) hypertension: Secondary | ICD-10-CM | POA: Diagnosis not present

## 2017-05-30 DIAGNOSIS — E119 Type 2 diabetes mellitus without complications: Secondary | ICD-10-CM | POA: Diagnosis not present

## 2017-05-30 LAB — BAYER DCA HB A1C WAIVED: HB A1C (BAYER DCA - WAIVED): 8.4 % — ABNORMAL HIGH (ref <7.0)

## 2017-05-30 LAB — COAGUCHEK XS/INR WAIVED
INR: 4.2 — ABNORMAL HIGH (ref 0.9–1.1)
Prothrombin Time: 50.9 s

## 2017-05-30 MED ORDER — CITALOPRAM HYDROBROMIDE 20 MG PO TABS: 20.0000 mg | ORAL_TABLET | Freq: Every day | ORAL | 3 refills | Status: DC

## 2017-05-30 NOTE — Progress Notes (Signed)
BP 132/84 (BP Location: Left Arm)   Pulse (!) 107   Wt 240 lb (108.9 kg)   SpO2 98%   BMI 36.49 kg/m    Subjective:    Patient ID: Rodney CharyPaul T Leopard [name], male    DOB: 07-27-1960, 57 y.o.   MRN: [id number]  HPI: Rodney Biaul T Mcadam [name] is a 57 y.o. male  Chief Complaint  Patient presents with  . Diabetes  . Shoulder Pain  Patient with multiple concerns diabetes doing better than ever.  No low blood sugar spells but A1c's lower than it has been in over a year at 8.4 today.  Still room for improvement of course but patient working on that and will continue current medications. Blood pressure doing well no complaints from medications. Had some left shoulder pain and soreness no known trauma irritation but large trigger point in left trapezius area. Patient also with intermittent problems with being sad blue especially since heart surgery done 12 years ago over the last month or so has had more problems to the point where just sitting watching a TV show could start crying.  Loss of energy interest in usual things and feeling sad blue.  INR markedly elevated today patient has not changed anything continuing same dosing same diet no bleeding bruising issues.  Relevant past medical, surgical, family and social history reviewed and updated as indicated. Interim medical history since our last visit reviewed. Allergies and medications reviewed and updated.  Review of Systems  Constitutional: Negative.   Respiratory: Negative.   Cardiovascular: Negative.     Per HPI unless specifically indicated above     Objective:    BP 132/84 (BP Location: Left Arm)   Pulse (!) 107   Wt 240 lb (108.9 kg)   SpO2 98%   BMI 36.49 kg/m   Wt Readings from Last 3 Encounters:  05/30/17 240 lb (108.9 kg)  04/26/17 252 lb (114.3 kg)  04/04/17 259 lb (117.5 kg)    Physical Exam  Constitutional: He is oriented to person, place, and time. He appears well-developed and well-nourished.  HENT:  Head:  Normocephalic and atraumatic.  Eyes: Conjunctivae and EOM are normal.  Neck: Normal range of motion.  Cardiovascular: Normal rate, regular rhythm and normal heart sounds.  Pulmonary/Chest: Effort normal and breath sounds normal.  Musculoskeletal: Normal range of motion.  Neurological: He is alert and oriented to person, place, and time.  Skin: No erythema.  Psychiatric: He has a normal mood and affect. His behavior is normal. Judgment and thought content normal.    Results for orders placed or performed in visit on 04/26/17  CoaguChek XS/INR Waived  Result Value Ref Range   INR 2.5 (H) 0.9 - 1.1   Prothrombin Time 30.1 sec      Assessment & Plan:   Problem List Items Addressed This Visit      Cardiovascular and Mediastinum   Chronic atrial fibrillation (HCC) - Primary    Discussed warfarin will hold warfarin today and then start regular dosing tomorrow and regular diet tomorrow will recheck in 2 weeks      Relevant Orders   CoaguChek XS/INR Waived   Hypertension    The current medical regimen is effective;  continue present plan and medications.         Endocrine   Diabetes mellitus without complication (HCC)    Discussed diabetes continue getting better controlled by working on diet and exercise nutrition      Relevant Orders   [employer]  DCA Hb A1c Waived     Other   Hyperlipidemia   Relevant Orders   Bayer DCA Hb A1c Waived   Depression, major, single episode, moderate (HCC)    Discussed depression care and treatment will start citalopram 20 mg recheck 2 weeks      Relevant Medications   citalopram (CELEXA) 20 MG tablet       Follow up plan: Return in about 2 weeks (around 06/13/2017) for PT INR and depression check.

## 2017-05-30 NOTE — Assessment & Plan Note (Signed)
Discussed warfarin will hold warfarin today and then start regular dosing tomorrow and regular diet tomorrow will recheck in 2 weeks

## 2017-05-30 NOTE — Assessment & Plan Note (Signed)
Discussed depression care and treatment will start citalopram 20 mg recheck 2 weeks

## 2017-05-30 NOTE — Assessment & Plan Note (Signed)
The current medical regimen is effective;  continue present plan and medications.  

## 2017-05-30 NOTE — Assessment & Plan Note (Signed)
Discussed diabetes continue getting better controlled by working on diet and exercise nutrition

## 2017-06-12 ENCOUNTER — Ambulatory Visit: Payer: BLUE CROSS/BLUE SHIELD | Admitting: Family Medicine

## 2017-06-12 ENCOUNTER — Encounter: Payer: Self-pay | Admitting: Family Medicine

## 2017-06-12 VITALS — BP 136/86 | HR 84 | Wt 267.0 lb

## 2017-06-12 DIAGNOSIS — F321 Major depressive disorder, single episode, moderate: Secondary | ICD-10-CM

## 2017-06-12 DIAGNOSIS — I482 Chronic atrial fibrillation, unspecified: Secondary | ICD-10-CM

## 2017-06-12 DIAGNOSIS — Z954 Presence of other heart-valve replacement: Secondary | ICD-10-CM

## 2017-06-12 LAB — COAGUCHEK XS/INR WAIVED
INR: 3.4 — ABNORMAL HIGH (ref 0.9–1.1)
Prothrombin Time: 40.2 s

## 2017-06-12 NOTE — Assessment & Plan Note (Signed)
Showing some improvement will recheck again in 2 weeks

## 2017-06-12 NOTE — Assessment & Plan Note (Signed)
INR 3.5 today range will continue current care and treatment

## 2017-06-12 NOTE — Progress Notes (Signed)
   BP 136/86 (BP Location: Left Arm)   Pulse 84   Wt 267 lb (121.1 kg)   SpO2 94%   BMI 40.60 kg/m    Subjective:    Patient ID: Rodney CharyPaul T Trier [name], male    DOB: May 17, 1960, 57 y.o.   MRN: [id number]  HPI: Rodney Biaul T Bottenfield [name] is a 57 y.o. male  Chief Complaint  Patient presents with  . Follow-up   Follow-up warfarin PT/INR patient doing better feeling better trying to eat healthier. Has been taking citalopram without problems no sleep disturbance not sleeping well but that was before starting medications. May be reports about 50% better.  Relevant past medical, surgical, family and social history reviewed and updated as indicated. Interim medical history since our last visit reviewed. Allergies and medications reviewed and updated.  Review of Systems  Constitutional: Negative.   Respiratory: Negative.   Cardiovascular: Negative.     Per HPI unless specifically indicated above     Objective:    BP 136/86 (BP Location: Left Arm)   Pulse 84   Wt 267 lb (121.1 kg)   SpO2 94%   BMI 40.60 kg/m   Wt Readings from Last 3 Encounters:  06/12/17 267 lb (121.1 kg)  05/30/17 240 lb (108.9 kg)  04/26/17 252 lb (114.3 kg)    Physical Exam  Constitutional: He is oriented to person, place, and time. He appears well-developed and well-nourished.  HENT:  Head: Normocephalic and atraumatic.  Eyes: Conjunctivae and EOM are normal.  Neck: Normal range of motion.  Cardiovascular: Normal rate, regular rhythm and normal heart sounds.  Pulmonary/Chest: Effort normal and breath sounds normal.  Musculoskeletal: Normal range of motion.  Neurological: He is alert and oriented to person, place, and time.  Skin: No erythema.  Psychiatric: He has a normal mood and affect. His behavior is normal. Judgment and thought content normal.    Results for orders placed or performed in visit on 05/30/17  Bayer DCA Hb A1c Waived  Result Value Ref Range   Bayer DCA Hb A1c Waived 8.4 (H) <7.0 %    CoaguChek XS/INR Waived  Result Value Ref Range   INR 4.2 (H) 0.9 - 1.1   Prothrombin Time 50.9 sec      Assessment & Plan:   Problem List Items Addressed This Visit      Cardiovascular and Mediastinum   Chronic atrial fibrillation (HCC) - Primary   Relevant Orders   CoaguChek XS/INR Waived     Other   S/P aortic valve replacement with metallic valve    INR 3.5 today range will continue current care and treatment      Depression, major, single episode, moderate (HCC)    Showing some improvement will recheck again in 2 weeks          Follow up plan: Return in about 2 weeks (around 06/26/2017) for PT INR.

## 2017-06-17 ENCOUNTER — Other Ambulatory Visit: Payer: Self-pay | Admitting: Family Medicine

## 2017-06-17 DIAGNOSIS — I482 Chronic atrial fibrillation, unspecified: Secondary | ICD-10-CM

## 2017-06-19 NOTE — Telephone Encounter (Signed)
Coumadin 4 mg and Coumadin 5 mg refill Last OV: 06/12/17 Last Refill:02/14/17 [facility]

## 2017-06-28 ENCOUNTER — Ambulatory Visit (INDEPENDENT_AMBULATORY_CARE_PROVIDER_SITE_OTHER): Payer: Self-pay | Admitting: Family Medicine

## 2017-06-28 ENCOUNTER — Encounter: Payer: Self-pay | Admitting: Family Medicine

## 2017-06-28 VITALS — BP 139/81 | HR 85 | Wt 283.0 lb

## 2017-06-28 DIAGNOSIS — F321 Major depressive disorder, single episode, moderate: Secondary | ICD-10-CM

## 2017-06-28 DIAGNOSIS — I482 Chronic atrial fibrillation, unspecified: Secondary | ICD-10-CM

## 2017-06-28 LAB — COAGUCHEK XS/INR WAIVED
INR: 3.8 — ABNORMAL HIGH (ref 0.9–1.1)
Prothrombin Time: 46.1 s

## 2017-06-28 NOTE — Progress Notes (Signed)
   BP 139/81   Pulse 85   Wt 283 lb (128.4 kg)   SpO2 95%   BMI 43.03 kg/m    Subjective:    Patient ID: Rodney CharyPaul T Clemon [name], male    DOB: 12/14/1960, 57 y.o.   MRN: [id number]  HPI: Rodney Biaul T Korff [name] is a 57 y.o. male  Patient all in all doing well reports about 60% improved with citalopram Sleep no real improvement but stable Change with warfarin diet changes from time to time.  Chart review from Thanksgiving until now patient's been up and down with no dose change. Reviewed with patient importance of constant diet  Relevant past medical, surgical, family and social history reviewed and updated as indicated. Interim medical history since our last visit reviewed. Allergies and medications reviewed and updated.  Review of Systems  Constitutional: Negative.   Respiratory: Negative.   Cardiovascular: Negative.     Per HPI unless specifically indicated above     Objective:    BP 139/81   Pulse 85   Wt 283 lb (128.4 kg)   SpO2 95%   BMI 43.03 kg/m   Wt Readings from Last 3 Encounters:  06/28/17 283 lb (128.4 kg)  06/12/17 267 lb (121.1 kg)  05/30/17 240 lb (108.9 kg)    Physical Exam  Constitutional: He is oriented to person, place, and time. He appears well-developed and well-nourished.  HENT:  Head: Normocephalic and atraumatic.  Eyes: Conjunctivae and EOM are normal.  Neck: Normal range of motion.  Cardiovascular: Normal rate, regular rhythm and normal heart sounds.  Pulmonary/Chest: Effort normal and breath sounds normal.  Musculoskeletal: Normal range of motion.  Neurological: He is alert and oriented to person, place, and time.  Skin: No erythema.  Psychiatric: He has a normal mood and affect. His behavior is normal. Judgment and thought content normal.    Results for orders placed or performed in visit on 06/12/17  CoaguChek XS/INR Waived  Result Value Ref Range   INR 3.4 (H) 0.9 - 1.1   Prothrombin Time 40.2 sec      Assessment & Plan:   Problem  List Items Addressed This Visit      Cardiovascular and Mediastinum   Chronic atrial fibrillation (HCC) - Primary    Discuss INR 2 elevated today goal of 2-1/2-3-1/2.  Discussed diet nutrition      Relevant Orders   CoaguChek XS/INR Waived     Other   Depression, major, single episode, moderate (HCC)    The current medical regimen is effective;  continue present plan and medications.           Follow up plan: Return in about 4 weeks (around 07/26/2017) for PT INR, and depression check.

## 2017-06-28 NOTE — Assessment & Plan Note (Signed)
The current medical regimen is effective;  continue present plan and medications.  

## 2017-06-28 NOTE — Assessment & Plan Note (Signed)
Discuss INR 2 elevated today goal of 2-1/2-3-1/2.  Discussed diet nutrition

## 2017-07-25 ENCOUNTER — Emergency Department
Admission: EM | Admit: 2017-07-25 | Discharge: 2017-07-25 | Discharge status: Against Medical Advice | Payer: BLUE CROSS/BLUE SHIELD

## 2017-07-26 ENCOUNTER — Encounter: Payer: Self-pay | Admitting: Family Medicine

## 2017-07-26 ENCOUNTER — Ambulatory Visit: Payer: Self-pay | Admitting: Family Medicine

## 2017-07-26 VITALS — BP 134/81 | HR 78

## 2017-07-26 DIAGNOSIS — I10 Essential (primary) hypertension: Secondary | ICD-10-CM

## 2017-07-26 DIAGNOSIS — I482 Chronic atrial fibrillation, unspecified: Secondary | ICD-10-CM

## 2017-07-26 DIAGNOSIS — M545 Low back pain, unspecified: Secondary | ICD-10-CM | POA: Insufficient documentation

## 2017-07-26 DIAGNOSIS — E119 Type 2 diabetes mellitus without complications: Secondary | ICD-10-CM

## 2017-07-26 LAB — UA/M W/RFLX CULTURE, ROUTINE
Bilirubin, UA: NEGATIVE
Leukocytes, UA: NEGATIVE
Nitrite, UA: NEGATIVE
Protein, UA: NEGATIVE
RBC, UA: NEGATIVE
Specific Gravity, UA: 1.015 (ref 1.005–1.030)
Urobilinogen, Ur: 0.2 mg/dL (ref 0.2–1.0)
pH, UA: 6.5 (ref 5.0–7.5)

## 2017-07-26 LAB — COAGUCHEK XS/INR WAIVED
INR: 3.2 — ABNORMAL HIGH (ref 0.9–1.1)
Prothrombin Time: 38.2 s

## 2017-07-26 MED ORDER — CYCLOBENZAPRINE HCL 10 MG PO TABS: 10.0000 mg | ORAL_TABLET | Freq: Three times a day (TID) | ORAL | 0 refills | Status: DC | PRN

## 2017-07-26 MED ORDER — GABAPENTIN 300 MG PO CAPS: 600.0000 mg | ORAL_CAPSULE | Freq: Two times a day (BID) | ORAL | 3 refills | Status: DC

## 2017-07-26 NOTE — Assessment & Plan Note (Signed)
The current medical regimen is effective;  continue present plan and medications.  

## 2017-07-26 NOTE — Assessment & Plan Note (Signed)
Discussed exercise proper posture lifting.  Will use Flexeril cautions about drowsiness.  Cautions about use of nonsteroidal medications and warfarin.

## 2017-07-26 NOTE — Assessment & Plan Note (Signed)
We will check hemoglobin A1c next visit

## 2017-07-26 NOTE — Progress Notes (Signed)
   BP 134/81   Pulse 78   SpO2 98%    Subjective:    Patient ID: Rodney CharyPaul T Summerson [name], male    DOB: [birth year], 57 y.o.   MRN: [id number]  HPI: Rodney Biaul T Shryock [name] is a 57 y.o. male  Chief Complaint  Patient presents with  . Back Pain    Lower. X 5-4 days. On the outter sides. shooting pain w/ deep breath, worsening since started  Patient with low back pain as noted above no known specific trauma or irritation just seemed to start comes on with marked shocklike sensation does not radiate down his legs. No bowel bladder issues no blood in stool or urine no bleeding bruising issues from warfarin. Patient is taking some Aleve cautiously because of taking warfarin.  Also has taken Tylenol.  Has found that walking seems to help the most. Has had a lapse in his insurance needs samples of Toujeo which were given. Is taking other medications without problems or issues.   Relevant past medical, surgical, family and social history reviewed and updated as indicated. Interim medical history since our last visit reviewed. Allergies and medications reviewed and updated.  Review of Systems  Constitutional: Negative.   Respiratory: Negative.   Cardiovascular: Negative.     Per HPI unless specifically indicated above     Objective:    BP 134/81   Pulse 78   SpO2 98%   Wt Readings from Last 3 Encounters:  06/28/17 283 lb (128.4 kg)  06/12/17 267 lb (121.1 kg)  05/30/17 240 lb (108.9 kg)    Physical Exam  Constitutional: He is oriented to person, place, and time. He appears well-developed and well-nourished.  HENT:  Head: Normocephalic and atraumatic.  Eyes: Conjunctivae and EOM are normal.  Neck: Normal range of motion.  Cardiovascular: Normal rate, regular rhythm and normal heart sounds.  Pulmonary/Chest: Effort normal and breath sounds normal.  Musculoskeletal: Normal range of motion.  Neurological: He is alert and oriented to person, place, and time.  Skin: No erythema.    Psychiatric: He has a normal mood and affect. His behavior is normal. Judgment and thought content normal.    Results for orders placed or performed in visit on 06/28/17  CoaguChek XS/INR Waived  Result Value Ref Range   INR 3.8 (H) 0.9 - 1.1   Prothrombin Time 46.1 sec      Assessment & Plan:   Problem List Items Addressed This Visit      Cardiovascular and Mediastinum   Chronic atrial fibrillation (HCC) - Primary    The current medical regimen is effective;  continue present plan and medications.       Relevant Orders   CoaguChek XS/INR Waived   Hypertension    The current medical regimen is effective;  continue present plan and medications.         Endocrine   Diabetes mellitus without complication (HCC)    We will check hemoglobin A1c next visit        Other   Low back pain    Discussed exercise proper posture lifting.  Will use Flexeril cautions about drowsiness.  Cautions about use of nonsteroidal medications and warfarin.      Relevant Medications   cyclobenzaprine (FLEXERIL) 10 MG tablet   Other Relevant Orders   UA/M w/rflx Culture, Routine       Follow up plan: Return in about 4 weeks (around 08/23/2017) for Hemoglobin A1c, PT INR.

## 2017-08-19 ENCOUNTER — Other Ambulatory Visit: Payer: Self-pay | Admitting: Family Medicine

## 2017-08-19 DIAGNOSIS — I482 Chronic atrial fibrillation, unspecified: Secondary | ICD-10-CM

## 2017-08-21 NOTE — Telephone Encounter (Signed)
Warfarin 5 mg tablet refill request and Warfarin 4 mg tablet refill requests  LOV 07/26/17 with Dr. Dossie Arbourrissman  I'm getting a very high interaction warning between the Warfarin and Fenofibrate.  [facility] [place] [place] - [address]

## 2017-08-29 ENCOUNTER — Ambulatory Visit (INDEPENDENT_AMBULATORY_CARE_PROVIDER_SITE_OTHER): Payer: Self-pay | Admitting: Family Medicine

## 2017-08-29 ENCOUNTER — Encounter: Payer: Self-pay | Admitting: Family Medicine

## 2017-08-29 VITALS — BP 150/93 | HR 75 | Ht 70.0 in | Wt 255.0 lb

## 2017-08-29 DIAGNOSIS — I10 Essential (primary) hypertension: Secondary | ICD-10-CM

## 2017-08-29 DIAGNOSIS — E119 Type 2 diabetes mellitus without complications: Secondary | ICD-10-CM

## 2017-08-29 DIAGNOSIS — I482 Chronic atrial fibrillation, unspecified: Secondary | ICD-10-CM

## 2017-08-29 LAB — COAGUCHEK XS/INR WAIVED
INR: 2.2 — ABNORMAL HIGH (ref 0.9–1.1)
Prothrombin Time: 26 s

## 2017-08-29 LAB — BAYER DCA HB A1C WAIVED: HB A1C (BAYER DCA - WAIVED): 10.9 % — ABNORMAL HIGH (ref <7.0)

## 2017-08-29 NOTE — Assessment & Plan Note (Signed)
Discussed diabetes and control of medication patient will restart medicine gave samples and will follow-up 3 months for hemoglobin A1c.

## 2017-08-29 NOTE — Assessment & Plan Note (Signed)
Will do better

## 2017-08-29 NOTE — Assessment & Plan Note (Signed)
Continue current dosing of warfarin patient will do better with compliance

## 2017-08-29 NOTE — Progress Notes (Signed)
BP (!) 150/93   Pulse 75   Ht 5\' 10"  (1.778 m)   Wt 255 lb (115.7 kg)   SpO2 99%   BMI 36.59 kg/m    Subjective:    Patient ID: Rodney CharyPaul T Hanzlik [name], male    DOB: 1960-08-01, 57 y.o.   MRN: [id number]  HPI: Rodney Biaul T Mcnamee [name] is a 57 y.o. male  Chief Complaint  Patient presents with  . Follow-up  . Diabetes  Patient follow-ups had really tough emotional time these last 3 months and also a great deal of insurance problems and is out of both Ozempic and Toujeo. Gave patient samples to get started again.  Has been taking his warfarin.  Having trouble with his diet also.  Relevant past medical, surgical, family and social history reviewed and updated as indicated. Interim medical history since our last visit reviewed. Allergies and medications reviewed and updated.  Review of Systems  Constitutional: Negative.   Respiratory: Negative.   Cardiovascular: Negative.     Per HPI unless specifically indicated above     Objective:    BP (!) 150/93   Pulse 75   Ht 5\' 10"  (1.778 m)   Wt 255 lb (115.7 kg)   SpO2 99%   BMI 36.59 kg/m   Wt Readings from Last 3 Encounters:  08/29/17 255 lb (115.7 kg)  06/28/17 283 lb (128.4 kg)  06/12/17 267 lb (121.1 kg)    Physical Exam  Constitutional: He is oriented to person, place, and time. He appears well-developed and well-nourished.  HENT:  Head: Normocephalic and atraumatic.  Eyes: Conjunctivae and EOM are normal.  Neck: Normal range of motion.  Cardiovascular: Normal rate, regular rhythm and normal heart sounds.  Pulmonary/Chest: Effort normal and breath sounds normal.  Musculoskeletal: Normal range of motion.  Neurological: He is alert and oriented to person, place, and time.  Skin: No erythema.  Psychiatric: He has a normal mood and affect. His behavior is normal. Judgment and thought content normal.    Results for orders placed or performed in visit on 07/26/17  CoaguChek XS/INR Waived  Result Value Ref Range   INR 3.2  (H) 0.9 - 1.1   Prothrombin Time 38.2 sec  UA/M w/rflx Culture, Routine  Result Value Ref Range   Specific Gravity, UA 1.015 1.005 - 1.030   pH, UA 6.5 5.0 - 7.5   Color, UA Yellow Yellow   Appearance Ur Clear Clear   Leukocytes, UA Negative Negative   Protein, UA Negative Negative/Trace   Glucose, UA 3+ (A) Negative   Ketones, UA Trace (A) Negative   RBC, UA Negative Negative   Bilirubin, UA Negative Negative   Urobilinogen, Ur 0.2 0.2 - 1.0 mg/dL   Nitrite, UA Negative Negative      Assessment & Plan:   Problem List Items Addressed This Visit      Cardiovascular and Mediastinum   Chronic atrial fibrillation (HCC)    Continue current dosing of warfarin patient will do better with compliance      Relevant Orders   Bayer DCA Hb A1c Waived   CoaguChek XS/INR Waived   Hypertension - Primary    Will do better      Relevant Orders   Bayer DCA Hb A1c Waived   CoaguChek XS/INR Waived     Endocrine   Diabetes mellitus without complication (HCC)    Discussed diabetes and control of medication patient will restart medicine gave samples and will follow-up 3 months for hemoglobin A1c.  Relevant Orders   Bayer DCA Hb A1c Waived   CoaguChek XS/INR Waived       Follow up plan: Return in about 1 month (around 09/26/2017) for PT INR.

## 2017-09-27 ENCOUNTER — Ambulatory Visit: Payer: Self-pay | Admitting: Family Medicine

## 2017-09-27 NOTE — Progress Notes (Deleted)
   There were no vitals taken for this visit.   Subjective:    Patient ID: Rodney Chary [name], male    DOB: 08/14/1960, 57 y.o.   MRN: [id number]  CC: Coumadin management  HPI: This patient is a 57 y.o. male who presents for coumadin management. The expected duration of coumadin treatment is {Blank single:19197::"lifelong","3 months","6 months"} The reason for anticoagulation is  {Blank single:19197::"A. Fib","thombophlebitis","mechanical heart valve","non-mechanical heart valve","Factor V Leiden","DVT/PE"}.  Present Coumadin dose: Goal: {Blank single:19197::"2.0-3.0","2.5-3.5"} The patient does not have an active anticoagulation episode. Excessive bruising: {Blank single:19197::"yes","no"} Nose bleeding: {Blank single:19197::"yes","no"} Rectal bleeding: {Blank single:19197::"yes","no"} Prolonged menstrual cycles: {Blank single:19197::"yes","no","N/A"} Eating diet with consistent amounts of foods containing Vitamin K:{Blank single:19197::"yes","no"} Any recent antibiotic use? {Blank single:19197::"yes","no"}  Relevant past medical, surgical, family and social history reviewed and updated as indicated. Interim medical history since our last visit reviewed. Allergies and medications reviewed and updated.  ROS: Per HPI unless specifically indicated above     Objective:    There were no vitals taken for this visit.  Wt Readings from Last 3 Encounters:  08/29/17 255 lb (115.7 kg)  06/28/17 283 lb (128.4 kg)  06/12/17 267 lb (121.1 kg)     General: Well appearing, well nourished in no distress.  Normal mood and affect. Skin: No excessive bruising or rash  Last INR:     Last CBC:  Lab Results  Component Value Date   WBC 9.1 07/25/2016   HGB 15.1 07/25/2016   HCT 44.7 07/25/2016   MCV 83 07/25/2016   PLT 259 07/25/2016    Results for orders placed or performed in visit on 08/29/17  Bayer DCA Hb A1c Waived  Result Value Ref Range   HB A1C (BAYER DCA - WAIVED) 10.9 (H) <7.0  %  CoaguChek XS/INR Waived  Result Value Ref Range   INR 2.2 (H) 0.9 - 1.1   Prothrombin Time 26.0 sec       Assessment:   No diagnosis found.  Plan:   Discussed current plan face-to-face with patient. For coumadin dosing, elected to {Blank single:19197::"continue current dose","change dose to","hold dose"}. Will plan to recheck INR in {Blank single:19197::"1 month","1 week","2 weeks"}.

## 2017-09-28 ENCOUNTER — Ambulatory Visit: Payer: Self-pay | Admitting: Family Medicine

## 2017-09-29 ENCOUNTER — Ambulatory Visit: Payer: Self-pay | Admitting: Family Medicine

## 2017-10-18 ENCOUNTER — Other Ambulatory Visit: Payer: Self-pay | Admitting: Family Medicine

## 2017-10-18 DIAGNOSIS — I10 Essential (primary) hypertension: Secondary | ICD-10-CM

## 2017-10-18 NOTE — Telephone Encounter (Signed)
Benazepril HCL 40 mg refill request  LOV 08/29/17 with Dr. Dossie Arbourrissman.   F/U appt for BP was cancelled (5/24).   No future appts noted.    BP was 150/93 at last OV on 08/29/17 so not approved since no F/U noted and BP elevated.  [facility] [address] [facility] [place] [place] - [address]

## 2017-10-19 ENCOUNTER — Other Ambulatory Visit: Payer: Self-pay

## 2017-10-19 DIAGNOSIS — I10 Essential (primary) hypertension: Secondary | ICD-10-CM

## 2017-10-19 MED ORDER — HYDROCHLOROTHIAZIDE 25 MG PO TABS: 25.0000 mg | ORAL_TABLET | Freq: Every day | ORAL | 3 refills | Status: DC

## 2017-10-22 ENCOUNTER — Other Ambulatory Visit: Payer: Self-pay | Admitting: Family Medicine

## 2017-10-22 DIAGNOSIS — E119 Type 2 diabetes mellitus without complications: Secondary | ICD-10-CM

## 2017-10-31 ENCOUNTER — Other Ambulatory Visit: Payer: Self-pay | Admitting: Family Medicine

## 2017-10-31 DIAGNOSIS — I482 Chronic atrial fibrillation, unspecified: Secondary | ICD-10-CM

## 2017-11-02 NOTE — Telephone Encounter (Signed)
warfarin refill Last Refill:08/21/17 # 30 1 RF Last OV: 08/29/17 PCP: Dr Dossie Arbourrissman [facility] [address]  Pt canceled appt 09/29/17

## 2017-11-16 ENCOUNTER — Other Ambulatory Visit: Payer: Self-pay | Admitting: Family Medicine

## 2017-11-16 DIAGNOSIS — I482 Chronic atrial fibrillation, unspecified: Secondary | ICD-10-CM

## 2017-11-16 NOTE — Telephone Encounter (Signed)
Pt has an appt 11/20/17 with Dr Dossie Arbourrissman.

## 2017-11-16 NOTE — Telephone Encounter (Signed)
Scheduled with Dr Dossie Arbourrissman for 7/15  @ 3:45

## 2017-11-16 NOTE — Telephone Encounter (Signed)
Patient is scheduled for 7/15 @345  for INR with Dr Dossie Arbourrissman.    See request for warfarin.  Thank you

## 2017-11-16 NOTE — Telephone Encounter (Signed)
Needs appointment

## 2017-11-16 NOTE — Telephone Encounter (Signed)
Coumadin 5 mg refill Last Refill:11/02/17 # 30 1 RF Last OV: 08/29/17 PCP: Dr Dossie Arbourrissman [facility] [address] Last coag check 08/29/17

## 2017-11-18 ENCOUNTER — Other Ambulatory Visit: Payer: Self-pay | Admitting: Family Medicine

## 2017-11-20 ENCOUNTER — Ambulatory Visit: Payer: Self-pay | Admitting: Family Medicine

## 2017-11-20 ENCOUNTER — Encounter: Payer: Self-pay | Admitting: Family Medicine

## 2017-11-20 VITALS — BP 139/86 | HR 82 | Ht 70.0 in | Wt 255.9 lb

## 2017-11-20 DIAGNOSIS — Z794 Long term (current) use of insulin: Secondary | ICD-10-CM

## 2017-11-20 DIAGNOSIS — I482 Chronic atrial fibrillation, unspecified: Secondary | ICD-10-CM

## 2017-11-20 DIAGNOSIS — Z954 Presence of other heart-valve replacement: Secondary | ICD-10-CM

## 2017-11-20 DIAGNOSIS — I10 Essential (primary) hypertension: Secondary | ICD-10-CM

## 2017-11-20 DIAGNOSIS — E119 Type 2 diabetes mellitus without complications: Secondary | ICD-10-CM

## 2017-11-20 LAB — COAGUCHEK XS/INR WAIVED
INR: 1.1 (ref 0.9–1.1)
Prothrombin Time: 13 s

## 2017-11-20 MED ORDER — WARFARIN SODIUM 5 MG PO TABS: 5.0000 mg | ORAL_TABLET | Freq: Every day | ORAL | 1 refills | Status: DC

## 2017-11-20 MED ORDER — WARFARIN SODIUM 4 MG PO TABS: 4.0000 mg | ORAL_TABLET | Freq: Every day | ORAL | 1 refills | Status: DC

## 2017-11-20 NOTE — Telephone Encounter (Signed)
Fenofibrate refill Last OV:08/29/17; Appointment today, 11/20/17 Last refill:10/18/16 90 tab/3 refill ZOX:[id number]:[id number] [place] [place] - [address] [phone] (Phone) [phone] (Fax)

## 2017-11-20 NOTE — Assessment & Plan Note (Signed)
The current medical regimen is effective;  continue present plan and medications.  

## 2017-11-20 NOTE — Assessment & Plan Note (Signed)
Discussed will restart insulin and medications gave samples.  Patient will not eat and start losing weight save significant money.

## 2017-11-20 NOTE — Progress Notes (Signed)
BP 139/86   Pulse 82   Ht 5\' 10"  (1.778 m)   Wt 255 lb 14.4 oz (116.1 kg)   SpO2 98%   BMI 36.72 kg/m    Subjective:    Patient ID: Rodney CharyPaul T Hard [name], male    DOB: 07-Mar-1961, 57 y.o.   MRN: [id number]  HPI: Rodney Biaul T Allensworth [name] is a 57 y.o. male  Chief Complaint  Patient presents with  . Coagulation Disorder  Discussed with patient poor coagulation control which stems from the patient only taking warfarin 5 mg once a day.  Had run out of 4 mg and [facility] in spite of prescription being sent in on July 11 reports not having.  As a consequence test sent in both 5 mg and 4 mg [facility] to [facility] 90 pills with 1 refill each.  Patient understands he should have warfarin to last until December.  Patient's diabetes also as expected doing poorly as not taking Toujeo.  Patient does not have health insurance and has been avoiding the most expensive medication.  Taking other medications as prescribed.   Also hypertension doing well on medications.   Relevant past medical, surgical, family and social history reviewed and updated as indicated. Interim medical history since our last visit reviewed. Allergies and medications reviewed and updated.  Review of Systems  Constitutional: Negative.   Respiratory: Negative.   Cardiovascular: Negative.     Per HPI unless specifically indicated above     Objective:    BP 139/86   Pulse 82   Ht 5\' 10"  (1.778 m)   Wt 255 lb 14.4 oz (116.1 kg)   SpO2 98%   BMI 36.72 kg/m   Wt Readings from Last 3 Encounters:  11/20/17 255 lb 14.4 oz (116.1 kg)  08/29/17 255 lb (115.7 kg)  06/28/17 283 lb (128.4 kg)    Physical Exam  Constitutional: He is oriented to person, place, and time. He appears well-developed and well-nourished.  HENT:  Head: Normocephalic and atraumatic.  Eyes: Conjunctivae and EOM are normal.  Neck: Normal range of motion.  Cardiovascular: Normal rate, regular rhythm and normal heart sounds.  Pulmonary/Chest: Effort normal and breath  sounds normal.  Musculoskeletal: Normal range of motion.  Neurological: He is alert and oriented to person, place, and time.  Skin: No erythema.  Psychiatric: He has a normal mood and affect. His behavior is normal. Judgment and thought content normal.    Results for orders placed or performed in visit on 08/29/17  Bayer DCA Hb A1c Waived  Result Value Ref Range   HB A1C (BAYER DCA - WAIVED) 10.9 (H) <7.0 %  CoaguChek XS/INR Waived  Result Value Ref Range   INR 2.2 (H) 0.9 - 1.1   Prothrombin Time 26.0 sec      Assessment & Plan:   Problem List Items Addressed This Visit      Cardiovascular and Mediastinum   Chronic atrial fibrillation (HCC)    The current medical regimen is effective;  continue present plan and medications.       Relevant Medications   warfarin (COUMADIN) 5 MG tablet   warfarin (COUMADIN) 4 MG tablet   Other Relevant Orders   CoaguChek XS/INR Waived   Essential (primary) hypertension    The current medical regimen is effective;  continue present plan and medications.       Relevant Medications   warfarin (COUMADIN) 5 MG tablet   warfarin (COUMADIN) 4 MG tablet     Endocrine  Type 2 diabetes mellitus without complications (HCC)    Discussed will restart insulin and medications gave samples.  Patient will not eat and start losing weight save significant money.        Other   S/P aortic valve replacement with metallic valve - Primary   Relevant Orders   CoaguChek XS/INR Waived       Follow up plan: Return in about 1 month (around 12/18/2017) for Hemoglobin A1c, PT INR.

## 2017-12-19 ENCOUNTER — Ambulatory Visit: Payer: Self-pay | Admitting: Family Medicine

## 2018-01-17 ENCOUNTER — Other Ambulatory Visit: Payer: Self-pay | Admitting: Family Medicine

## 2018-01-17 DIAGNOSIS — I10 Essential (primary) hypertension: Secondary | ICD-10-CM

## 2018-01-17 DIAGNOSIS — E119 Type 2 diabetes mellitus without complications: Secondary | ICD-10-CM

## 2018-01-31 ENCOUNTER — Other Ambulatory Visit: Payer: Self-pay | Admitting: Family Medicine

## 2018-01-31 DIAGNOSIS — I10 Essential (primary) hypertension: Secondary | ICD-10-CM

## 2018-02-01 ENCOUNTER — Other Ambulatory Visit: Payer: Self-pay | Admitting: Family Medicine

## 2018-02-01 DIAGNOSIS — I10 Essential (primary) hypertension: Secondary | ICD-10-CM

## 2018-02-09 ENCOUNTER — Telehealth: Payer: Self-pay

## 2018-02-09 DIAGNOSIS — I10 Essential (primary) hypertension: Secondary | ICD-10-CM

## 2018-02-09 MED ORDER — CARVEDILOL 25 MG PO TABS: 25.0000 mg | ORAL_TABLET | Freq: Two times a day (BID) | ORAL | 0 refills | Status: DC

## 2018-02-09 NOTE — Telephone Encounter (Signed)
Cardevilol 25 mg 1 tablet BID   Patient is scheduled for a follow up on 03/08/18

## 2018-02-18 ENCOUNTER — Other Ambulatory Visit: Payer: Self-pay | Admitting: Family Medicine

## 2018-02-18 DIAGNOSIS — E119 Type 2 diabetes mellitus without complications: Secondary | ICD-10-CM

## 2018-02-19 NOTE — Telephone Encounter (Signed)
Requested medication (s) are due for refill today: Yes  Requested medication (s) are on the active medication list: Yes  Last refill:  01/17/18  Future visit scheduled: Yes  Notes to clinic:  Unable to refill per protocol due to abnormal labs     Requested Prescriptions  Pending Prescriptions Disp Refills   metFORMIN (GLUCOPHAGE) 500 MG tablet [Pharmacy Med Name: METFORMIN HCL 500 MG TABLET] 120 tablet 0    Sig: TAKE 2 TABLETS BY MOUTH TWICE A DAY     Endocrinology:  Diabetes - Biguanides Failed - 02/18/2018  1:10 AM      Failed - Cr in normal range and within 360 days    Creatinine, Ser  Date Value Ref Range Status  11/15/2016 1.21 0.76 - 1.27 mg/dL Final         Failed - HBA1C is between 0 and 7.9 and within 180 days    Hemoglobin A1C  Date Value Ref Range Status  12/16/2015 10.4  Final   Hgb A1c MFr Bld  Date Value Ref Range Status  07/25/2016 10.6 (H) 4.8 - 5.6 % Final    Comment:             Pre-diabetes: 5.7 - 6.4          Diabetes: >6.4          Glycemic control for adults with diabetes: <7.0          Failed - eGFR in normal range and within 360 days    GFR calc Af Amer  Date Value Ref Range Status  11/15/2016 77 >59 mL/min/1.73 Final   GFR calc non Af Amer  Date Value Ref Range Status  11/15/2016 67 >59 mL/min/1.73 Final         Passed - Valid encounter within last 6 months    Recent Outpatient Visits          3 months ago S/P aortic valve replacement with metallic valve   [facility] Cr[place]man, Jeannette How, MD   5 months ago Essential hypertension   [place], Jeannette How, MD   6 months ago Chronic atrial fibrillation [facility])   [place], Jeannette How, MD   7 months ago Chronic atrial fibrillation [facility])   [place], Jeannette How, MD   8 months ago Chronic atrial fibrillation [facility])   [place], Jeannette How, MD      Future Appointments            In 2 weeks  [place], Jeannette How, MD [facility]

## 2018-03-05 ENCOUNTER — Emergency Department
Admission: EM | Admit: 2018-03-05 | Discharge: 2018-03-05 | Disposition: A | Discharge status: Home / Self Care | Payer: Self-pay | Attending: Emergency Medicine | Admitting: Emergency Medicine

## 2018-03-05 ENCOUNTER — Emergency Department: Payer: Self-pay

## 2018-03-05 ENCOUNTER — Other Ambulatory Visit: Payer: Self-pay

## 2018-03-05 DIAGNOSIS — R079 Chest pain, unspecified: Secondary | ICD-10-CM | POA: Insufficient documentation

## 2018-03-05 DIAGNOSIS — E119 Type 2 diabetes mellitus without complications: Secondary | ICD-10-CM | POA: Insufficient documentation

## 2018-03-05 DIAGNOSIS — Z7901 Long term (current) use of anticoagulants: Secondary | ICD-10-CM | POA: Insufficient documentation

## 2018-03-05 DIAGNOSIS — F329 Major depressive disorder, single episode, unspecified: Secondary | ICD-10-CM | POA: Insufficient documentation

## 2018-03-05 DIAGNOSIS — Z952 Presence of prosthetic heart valve: Secondary | ICD-10-CM | POA: Insufficient documentation

## 2018-03-05 DIAGNOSIS — I10 Essential (primary) hypertension: Secondary | ICD-10-CM | POA: Insufficient documentation

## 2018-03-05 DIAGNOSIS — Z794 Long term (current) use of insulin: Secondary | ICD-10-CM | POA: Insufficient documentation

## 2018-03-05 LAB — CBC
HCT: 42.6 % (ref 39.0–52.0)
Hemoglobin: 14.1 g/dL (ref 13.0–17.0)
MCH: 27.6 pg (ref 26.0–34.0)
MCHC: 33.1 g/dL (ref 30.0–36.0)
MCV: 83.4 fL (ref 80.0–100.0)
Platelets: 253 10*3/uL (ref 150–400)
RBC: 5.11 MIL/uL (ref 4.22–5.81)
RDW: 13 % (ref 11.5–15.5)
WBC: 7.5 10*3/uL (ref 4.0–10.5)
nRBC: 0 % (ref 0.0–0.2)

## 2018-03-05 LAB — BASIC METABOLIC PANEL
Anion gap: 11 (ref 5–15)
BUN: 16 mg/dL (ref 6–20)
CO2: 25 mmol/L (ref 22–32)
Calcium: 9.2 mg/dL (ref 8.9–10.3)
Chloride: 98 mmol/L (ref 98–111)
Creatinine, Ser: 0.98 mg/dL (ref 0.61–1.24)
GFR calc Af Amer: >60 mL/min (ref >60)
GFR calc non Af Amer: >60 mL/min (ref >60)
Glucose, Bld: 304 mg/dL — ABNORMAL HIGH (ref 70–99)
Potassium: 3.9 mmol/L (ref 3.5–5.1)
Sodium: 134 mmol/L — ABNORMAL LOW (ref 135–145)

## 2018-03-05 LAB — TROPONIN I
Troponin I: <0.03 ng/mL (ref <0.03)
Troponin I: <0.03 ng/mL (ref <0.03)

## 2018-03-05 LAB — PROTIME-INR
INR: 2.02
Prothrombin Time: 22.6 seconds — ABNORMAL HIGH (ref 11.4–15.2)

## 2018-03-05 NOTE — ED Provider Notes (Signed)
[facility] Emergency Department Provider Note   ____________________________________________   I have reviewed the triage vital signs and the nursing notes.   HISTORY  Chief Complaint Chest Pain   History limited by: Not Limited   HPI Rodney HIGHAM [name] is a 57 y.o. male who presents to the emergency department today because of concern for chest pain. The pain started this morning. Located in his left chest. The patient was at work when the pain started.  Describes it as sharp.  It radiates from his central to his left chest.  Patient denies any associated shortness of breath or diaphoresis or sweating.  He states that when he presses on his left chest wall he still feels some soreness.  He denies similar pain in the past.  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    Per medical record review patient has a history of DM, HLD, HTN.   Past Medical History:  Diagnosis Date  . Allergy   . Diabetes mellitus without complication (HCC)   . Hyperlipidemia   . Hypertension     Patient Active Problem List   Diagnosis Date Noted  . Low back pain 07/26/2017  . Depression, major, single episode, moderate (HCC) 05/30/2017  . Fatigue 08/06/2015  . Hyperlipidemia 04/15/2015  . S/P aortic valve replacement with metallic valve 01/29/2015  . Presence of other heart-valve replacement 01/29/2015  . Chronic atrial fibrillation 11/24/2014  . Essential (primary) hypertension 11/24/2014  . Type 2 diabetes mellitus without complications (HCC) 11/24/2014    Past Surgical History:  Procedure Laterality Date  . CARDIAC VALVE REPLACEMENT      Prior to Admission medications   Medication Sig Start Date End Date Taking? Authorizing Provider  benazepril (LOTENSIN) 40 MG tablet TAKE 1 TABLET BY MOUTH EVERY DAY 10/18/17   Steele Sizer, MD  carvedilol (COREG) 25 MG tablet Take 1 tablet (25 mg total) by  mouth 2 (two) times daily with a meal. 02/09/18   Johnson, Megan P, DO  citalopram (CELEXA) 20 MG tablet Take 1 tablet (20 mg total) by mouth daily. 05/30/17   Steele Sizer, MD  cyclobenzaprine (FLEXERIL) 10 MG tablet Take 1 tablet (10 mg total) by mouth 3 (three) times daily as needed for muscle spasms. 07/26/17   Steele Sizer, MD  dapagliflozin propanediol (FARXIGA) 10 MG TABS tablet Take 10 mg by mouth daily. 10/18/16   Steele Sizer, MD  fenofibrate 160 MG tablet TAKE 1 TABLET BY MOUTH EVERY DAY 11/20/17   Steele Sizer, MD  gabapentin (NEURONTIN) 300 MG capsule Take 2 capsules (600 mg total) by mouth 2 (two) times daily. 07/26/17   Steele Sizer, MD  hydrochlorothiazide (HYDRODIURIL) 25 MG tablet Take 1 tablet (25 mg total) by mouth daily. 10/19/17   Steele Sizer, MD  Insulin Glargine (TOUJEO SOLOSTAR) 300 UNIT/ML SOPN Inject 85 Units into the skin daily. 07/06/16   Steele Sizer, MD  metFORMIN (GLUCOPHAGE) 500 MG tablet TAKE 2 TABLETS BY MOUTH TWICE A DAY 02/19/18   Crissman, Redge Gainer, MD  Semaglutide (OZEMPIC) 1 MG/DOSE SOPN Inject 1 mg into the skin once a week. 02/16/17   Steele Sizer, MD  warfarin (COUMADIN) 4 MG tablet Take 1 tablet (4 mg total) by mouth daily. 11/20/17   Steele Sizer, MD  warfarin (COUMADIN) 5 MG tablet Take 1 tablet (5 mg total) by mouth  daily. 11/20/17   Steele Sizer, MD    Allergies Patient has no known allergies.  Family History  Problem Relation Age of Onset  . Diabetes Mother   . Heart disease Mother   . Hyperlipidemia Mother   . Hypertension Mother     Social History Social History   Tobacco Use  . Smoking status: Never Smoker  . Smokeless tobacco: Never Used  Substance Use Topics  . Alcohol use: No  . Drug use: No    Review of Systems Constitutional: No fever/chills Eyes: No visual changes. ENT: No sore throat. Cardiovascular: Positive for chest pain. Respiratory: Denies shortness of breath. Gastrointestinal: No  abdominal pain.  No nausea, no vomiting.  No diarrhea.   Genitourinary: Negative for dysuria. Musculoskeletal: Negative for back pain. Skin: Negative for rash. Neurological: Negative for headaches, focal weakness or numbness.  ____________________________________________   PHYSICAL EXAM:  VITAL SIGNS: ED Triage Vitals  Enc Vitals Group     BP 03/05/18 1034 (!) 188/114     Pulse Rate 03/05/18 1034 77     Resp 03/05/18 1034 18     Temp 03/05/18 1034 97.8 F (36.6 C)     Temp Source 03/05/18 1431 Oral     SpO2 03/05/18 1034 98 %     Weight 03/05/18 1034 260 lb (117.9 kg)     Height 03/05/18 1034 5\' 9"  (1.753 m)     Head Circumference --      Peak Flow --      Pain Score 03/05/18 1034 5   Constitutional: Alert and oriented.  Eyes: Conjunctivae are normal.  ENT      Head: Normocephalic and atraumatic.      Nose: No congestion/rhinnorhea.      Mouth/Throat: Mucous membranes are moist.      Neck: No stridor. Hematological/Lymphatic/Immunilogical: No cervical lymphadenopathy. Cardiovascular: Normal rate, regular rhythm.  No murmurs, rubs, or gallops.  Respiratory: Normal respiratory effort without tachypnea nor retractions. Breath sounds are clear and equal bilaterally. No wheezes/rales/rhonchi. Gastrointestinal: Soft and non tender. No rebound. No guarding.  Genitourinary: Deferred Musculoskeletal: Normal range of motion in all extremities. No lower extremity edema. Neurologic:  Normal speech and language. No gross focal neurologic deficits are appreciated.  Skin:  Skin is warm, dry and intact. No rash noted. Psychiatric: Mood and affect are normal. Speech and behavior are normal. Patient exhibits appropriate insight and judgment.  ____________________________________________    LABS (pertinent positives/negatives)  Trop <0.03 x 2 INR 2.02 CBC wnl BMP na 134, glu 304 otherwise wnl  ____________________________________________   EKG  I, Phineas Semen, attending  physician, personally viewed and interpreted this EKG  EKG Time: 1020 Rate: 77 Rhythm: normal sinus rhythm Axis: normal Intervals: qtc 430 QRS: narrow, q waves v1, v2 ST changes: no st elevation Impression: abnormal ekg   ____________________________________________    RADIOLOGY  CXR Borderline heart size no active disease  ____________________________________________   PROCEDURES  Procedures  ____________________________________________   INITIAL IMPRESSION / ASSESSMENT AND PLAN / ED COURSE  Pertinent labs & imaging results that were available during my care of the patient were reviewed by me and considered in my medical decision making (see chart for details).   Patient presented to the emergency department today because of concerns for chest pain.  Differential be broad including pneumonia, pneumothorax, PE, dissection, esophagitis, costochondritis.  Patient had 2- troponins.  EKG without any acute findings.  Chest x-ray without obvious pneumonia or pneumo thorax.  This point I doubt PE  or dissection.  Given that the patient consents some soreness in his left after else do wonder if costochondritis or musculoskeletal system is the etiology of patient's pain.  Did discuss the findings and thoughts with the patient.  The discussed importance of primary care follow-up the patient states he has appointment in 3 days.   ____________________________________________   FINAL CLINICAL IMPRESSION(S) / ED DIAGNOSES  Final diagnoses:  Nonspecific chest pain     Note: This dictation was prepared with Dragon dictation. Any transcriptional errors that result from this process are unintentional     Phineas Semen, MD 03/05/18 1739

## 2018-03-05 NOTE — Discharge Instructions (Addendum)
Please seek medical attention for any high fevers, chest pain, shortness of breath, change in behavior, persistent vomiting, bloody stool or any other new or concerning symptoms.  

## 2018-03-05 NOTE — ED Triage Notes (Signed)
Pt c/o intermittent chest pain that began this morning.

## 2018-03-05 NOTE — ED Notes (Signed)
VS updated, this RN apologized for long wait. Pt denies any pain at this time, sitting in lobby watching TV, visualized in NAD.

## 2018-03-05 NOTE — ED Notes (Signed)
Lab notified to add on PT-INR. 

## 2018-03-05 NOTE — ED Notes (Signed)
First Nurse Note: Patient complaining of left sided chest pain "off and on", DM, has mechanical valve placed in 2006 does not know which cardiac valve.  Did not check BS today.  Alert and oriented, color good.  Denies radiation of pain and is pain free at present.  EKG completed and reviewed by Dr. Mayford Knife.

## 2018-03-08 ENCOUNTER — Ambulatory Visit: Payer: Self-pay | Admitting: Family Medicine

## 2018-03-08 ENCOUNTER — Encounter: Payer: Self-pay | Admitting: Family Medicine

## 2018-03-08 VITALS — BP 149/96 | HR 80 | Temp 98.4°F | Wt 260.0 lb

## 2018-03-08 DIAGNOSIS — Z1211 Encounter for screening for malignant neoplasm of colon: Secondary | ICD-10-CM

## 2018-03-08 DIAGNOSIS — Z23 Encounter for immunization: Secondary | ICD-10-CM

## 2018-03-08 DIAGNOSIS — Z954 Presence of other heart-valve replacement: Secondary | ICD-10-CM

## 2018-03-08 DIAGNOSIS — I10 Essential (primary) hypertension: Secondary | ICD-10-CM

## 2018-03-08 DIAGNOSIS — F321 Major depressive disorder, single episode, moderate: Secondary | ICD-10-CM

## 2018-03-08 DIAGNOSIS — Z794 Long term (current) use of insulin: Secondary | ICD-10-CM

## 2018-03-08 DIAGNOSIS — I482 Chronic atrial fibrillation, unspecified: Secondary | ICD-10-CM

## 2018-03-08 DIAGNOSIS — E119 Type 2 diabetes mellitus without complications: Secondary | ICD-10-CM

## 2018-03-08 MED ORDER — DAPAGLIFLOZIN PROPANEDIOL 10 MG PO TABS: 10.0000 mg | ORAL_TABLET | Freq: Every day | ORAL | 3 refills | Status: DC

## 2018-03-08 MED ORDER — CITALOPRAM HYDROBROMIDE 20 MG PO TABS: 20.0000 mg | ORAL_TABLET | Freq: Every day | ORAL | 3 refills | Status: DC

## 2018-03-08 MED ORDER — METFORMIN HCL 500 MG PO TABS: ORAL_TABLET | ORAL | 3 refills | Status: DC

## 2018-03-08 MED ORDER — INSULIN GLARGINE 300 UNIT/ML ~~LOC~~ SOPN: 85.0000 [IU] | PEN_INJECTOR | Freq: Every day | SUBCUTANEOUS | 12 refills | Status: DC

## 2018-03-08 MED ORDER — CARVEDILOL 25 MG PO TABS: 25.0000 mg | ORAL_TABLET | Freq: Two times a day (BID) | ORAL | 3 refills | Status: DC

## 2018-03-08 MED ORDER — BENAZEPRIL HCL 40 MG PO TABS: 40.0000 mg | ORAL_TABLET | Freq: Every day | ORAL | 3 refills | Status: DC

## 2018-03-08 MED ORDER — SEMAGLUTIDE (1 MG/DOSE) 2 MG/1.5ML ~~LOC~~ SOPN: 1.0000 mg | PEN_INJECTOR | SUBCUTANEOUS | 4 refills | Status: DC

## 2018-03-08 NOTE — Assessment & Plan Note (Signed)
The current medical regimen is effective;  continue present plan and medications.  

## 2018-03-08 NOTE — Assessment & Plan Note (Signed)
Ongoing poor control gave patient coupons that hopefully will work with his insurance next month.

## 2018-03-08 NOTE — Progress Notes (Signed)
BP (!) 149/96   Pulse 80   Temp 98.4 F (36.9 C) (Oral)   Wt 260 lb (117.9 kg)   SpO2 98%   BMI 38.40 kg/m    Subjective:    Patient ID: Rodney Chary [name], male    DOB: 1961-03-31, 57 y.o.   MRN: [id number]  HPI: Rodney Dixon. is a 57 y.o. male  Chief Complaint  Patient presents with  . Follow-up  . Diabetes  . Coagulation Disorder  . Chest Pain    Went to E.R.,   Patient with multiple concerns.  Follow-up from ER and chest pain which work-up was negative reviewed ER notes and patient's follow-up which was and is negative. Reviewed diabetes with extremely poor control patient only taking metformin because of cost of other medications patient is eligible to get new insurance tomorrow and will be getting that reviewed various options for medication and patient will select insurance based on pharmacy benefits. Taking warfarin without problems.  No bleeding bruising issues.   Relevant past medical, surgical, family and social history reviewed and updated as indicated. Interim medical history since our last visit reviewed. Allergies and medications reviewed and updated.  Review of Systems  Constitutional: Negative.   Respiratory: Negative.   Cardiovascular: Negative.     Per HPI unless specifically indicated above     Objective:    BP (!) 149/96   Pulse 80   Temp 98.4 F (36.9 C) (Oral)   Wt 260 lb (117.9 kg)   SpO2 98%   BMI 38.40 kg/m   Wt Readings from Last 3 Encounters:  03/08/18 260 lb (117.9 kg)  03/05/18 260 lb (117.9 kg)  11/20/17 255 lb 14.4 oz (116.1 kg)    Physical Exam  Constitutional: He is oriented to person, place, and time. He appears well-developed and well-nourished.  HENT:  Head: Normocephalic and atraumatic.  Eyes: Conjunctivae and EOM are normal.  Neck: Normal range of motion.  Cardiovascular: Normal rate, regular rhythm and normal heart sounds.  Pulmonary/Chest: Effort normal and breath sounds normal.  Musculoskeletal: Normal  range of motion.  Neurological: He is alert and oriented to person, place, and time.  Skin: No erythema.  Psychiatric: He has a normal mood and affect. His behavior is normal. Judgment and thought content normal.    Results for orders placed or performed during the hospital encounter of 03/05/18  Basic metabolic panel  Result Value Ref Range   Sodium 134 (L) 135 - 145 mmol/L   Potassium 3.9 3.5 - 5.1 mmol/L   Chloride 98 98 - 111 mmol/L   CO2 25 22 - 32 mmol/L   Glucose, Bld 304 (H) 70 - 99 mg/dL   BUN 16 6 - 20 mg/dL   Creatinine, Ser 4.09 0.61 - 1.24 mg/dL   Calcium 9.2 8.9 - 81.1 mg/dL   GFR calc non Af Amer >60 >60 mL/min   GFR calc Af Amer >60 >60 mL/min   Anion gap 11 5 - 15  CBC  Result Value Ref Range   WBC 7.5 4.0 - 10.5 K/uL   RBC 5.11 4.22 - 5.81 MIL/uL   Hemoglobin 14.1 13.0 - 17.0 g/dL   HCT 91.4 78.2 - 95.6 %   MCV 83.4 80.0 - 100.0 fL   MCH 27.6 26.0 - 34.0 pg   MCHC 33.1 30.0 - 36.0 g/dL   RDW 21.3 08.6 - 57.8 %   Platelets 253 150 - 400 K/uL   nRBC 0.0 0.0 - 0.2 %  Troponin I  Result Value Ref Range   Troponin I <0.03 <0.03 ng/mL  Troponin I  Result Value Ref Range   Troponin I <0.03 <0.03 ng/mL  Protime-INR  Result Value Ref Range   Prothrombin Time 22.6 (H) 11.4 - 15.2 seconds   INR 2.02       Assessment & Plan:   Problem List Items Addressed This Visit      Cardiovascular and Mediastinum   Chronic atrial fibrillation - Primary    The current medical regimen is effective;  continue present plan and medications.       Relevant Medications   carvedilol (COREG) 25 MG tablet   benazepril (LOTENSIN) 40 MG tablet   Other Relevant Orders   CoaguChek XS/INR Waived   Essential (primary) hypertension   Relevant Medications   carvedilol (COREG) 25 MG tablet   benazepril (LOTENSIN) 40 MG tablet   Other Relevant Orders   Bayer DCA Hb A1c Waived     Endocrine   Type 2 diabetes mellitus without complications (HCC)    Ongoing poor control gave  patient coupons that hopefully will work with his insurance next month.      Relevant Medications   Albiglutide 50 MG PEN   Insulin Glulisine (APIDRA) 100 UNIT/ML Solostar Pen   Insulin Glargine (TOUJEO SOLOSTAR) 300 UNIT/ML SOPN   dapagliflozin propanediol (FARXIGA) 10 MG TABS tablet   Semaglutide, 1 MG/DOSE, (OZEMPIC, 1 MG/DOSE,) 2 MG/1.5ML SOPN   benazepril (LOTENSIN) 40 MG tablet   metFORMIN (GLUCOPHAGE) 500 MG tablet   Other Relevant Orders   Bayer DCA Hb A1c Waived     Other   Depression, major, single episode, moderate (HCC)    The current medical regimen is effective;  continue present plan and medications.       Relevant Medications   citalopram (CELEXA) 20 MG tablet   Presence of other heart-valve replacement   Relevant Orders   CoaguChek XS/INR Waived    Other Visit Diagnoses    Needs flu shot       Colon cancer screening       Essential hypertension       Relevant Medications   carvedilol (COREG) 25 MG tablet   benazepril (LOTENSIN) 40 MG tablet   Diabetes mellitus without complication (HCC)       Relevant Medications   Albiglutide 50 MG PEN   Insulin Glulisine (APIDRA) 100 UNIT/ML Solostar Pen   Insulin Glargine (TOUJEO SOLOSTAR) 300 UNIT/ML SOPN   dapagliflozin propanediol (FARXIGA) 10 MG TABS tablet   Semaglutide, 1 MG/DOSE, (OZEMPIC, 1 MG/DOSE,) 2 MG/1.5ML SOPN   benazepril (LOTENSIN) 40 MG tablet   metFORMIN (GLUCOPHAGE) 500 MG tablet       Follow up plan: Return in about 4 weeks (around 04/05/2018) for PT INR and 3 months for diabetes recheck with hemoglobin A1c.Marland Kitchen

## 2018-03-09 LAB — BAYER DCA HB A1C WAIVED: HB A1C (BAYER DCA - WAIVED): 12.8 % — ABNORMAL HIGH (ref <7.0)

## 2018-03-09 LAB — COAGUCHEK XS/INR WAIVED
INR: 2.4 — ABNORMAL HIGH (ref 0.9–1.1)
Prothrombin Time: 29.3 s

## 2018-04-09 ENCOUNTER — Encounter: Payer: Self-pay | Admitting: Family Medicine

## 2018-04-09 ENCOUNTER — Ambulatory Visit (INDEPENDENT_AMBULATORY_CARE_PROVIDER_SITE_OTHER): Payer: Self-pay | Admitting: Family Medicine

## 2018-04-09 VITALS — BP 171/98 | HR 90 | Wt 254.0 lb

## 2018-04-09 DIAGNOSIS — I10 Essential (primary) hypertension: Secondary | ICD-10-CM

## 2018-04-09 DIAGNOSIS — I482 Chronic atrial fibrillation, unspecified: Secondary | ICD-10-CM

## 2018-04-09 DIAGNOSIS — R5382 Chronic fatigue, unspecified: Secondary | ICD-10-CM

## 2018-04-09 LAB — COAGUCHEK XS/INR WAIVED
INR: 2.3 — ABNORMAL HIGH (ref 0.9–1.1)
Prothrombin Time: 28 s

## 2018-04-09 MED ORDER — AMLODIPINE BESYLATE 5 MG PO TABS: 5.0000 mg | ORAL_TABLET | Freq: Every day | ORAL | 3 refills | Status: DC

## 2018-04-09 NOTE — Assessment & Plan Note (Signed)
Repeat INR 1 month

## 2018-04-09 NOTE — Assessment & Plan Note (Signed)
Discuss headaches and fatigue care better sleep

## 2018-04-09 NOTE — Progress Notes (Signed)
   BP (!) 171/98   Pulse 90   Wt 254 lb (115.2 kg)   SpO2 96%   BMI 37.51 kg/m    Subjective:    Patient ID: Rodney CharyPaul T Aston [name], male    DOB: 1960-09-14, 57 y.o.   MRN: [id number]  HPI: Rodney Biaul T Gulledge [name] is a 57 y.o. male  Chief Complaint  Patient presents with  . Coagulation Disorder  . Headache    Top of head x 3 days   Patient follow-up INR no bleeding bruising issues has been taking his warfarin lately at Thanksgiving was some extra greens.  Will not adjust his INR are warfarin levels today we will continue current treatment and recheck in 1 month. 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 Reviewed hypertension and medications.  Patient's been taking his medications faithfully.  Has been under a great deal of stress.  Relevant past medical, surgical, family and social history reviewed and updated as indicated. Interim medical history since our last visit reviewed. Allergies and medications reviewed and updated.  Review of Systems  Constitutional: Negative.   Respiratory: Negative.   Cardiovascular: Negative.     Per HPI unless specifically indicated above     Objective:    BP (!) 171/98   Pulse 90   Wt 254 lb (115.2 kg)   SpO2 96%   BMI 37.51 kg/m   Wt Readings from Last 3 Encounters:  04/09/18 254 lb (115.2 kg)  03/08/18 260 lb (117.9 kg)  03/05/18 260 lb (117.9 kg)    Physical Exam  Constitutional: He is oriented to person, place, and time. He appears well-developed and well-nourished.  HENT:  Head: Normocephalic and atraumatic.  Eyes: Conjunctivae and EOM are normal.  Neck: Normal range of motion.  Cardiovascular: Normal rate, regular rhythm and normal heart sounds.  Pulmonary/Chest: Effort normal and breath sounds normal.  Musculoskeletal: Normal range of motion.  Neurological: He is alert and oriented to person, place, and time.  Skin: No erythema.  Psychiatric: He  has a normal mood and affect. His behavior is normal. Judgment and thought content normal.    Results for orders placed or performed in visit on 04/09/18  CoaguChek XS/INR Waived  Result Value Ref Range   INR 2.3 (H) 0.9 - 1.1   Prothrombin Time 28.0 sec      Assessment & Plan:   Problem List Items Addressed This Visit      Cardiovascular and Mediastinum   Chronic atrial fibrillation - Primary    Repeat INR 1 month      Relevant Medications   amLODipine (NORVASC) 5 MG tablet   Other Relevant Orders   CoaguChek XS/INR Waived (Completed)   Essential (primary) hypertension    Poor control discussed control diet exercise nutrition and add amlodipine 5 mg      Relevant Medications   amLODipine (NORVASC) 5 MG tablet     Other   Fatigue    Discuss headaches and fatigue care better sleep          Follow up plan: Return in about 4 weeks (around 05/07/2018) for PT INR, and BP med check.

## 2018-04-09 NOTE — Assessment & Plan Note (Signed)
Poor control discussed control diet exercise nutrition and add amlodipine 5 mg

## 2018-04-16 ENCOUNTER — Encounter: Payer: Self-pay | Admitting: Family Medicine

## 2018-05-16 ENCOUNTER — Ambulatory Visit: Payer: Self-pay | Admitting: Family Medicine

## 2018-05-18 ENCOUNTER — Other Ambulatory Visit: Payer: Self-pay | Admitting: Family Medicine

## 2018-05-18 DIAGNOSIS — I482 Chronic atrial fibrillation, unspecified: Secondary | ICD-10-CM

## 2018-05-18 NOTE — Telephone Encounter (Signed)
Requested medication (s) are due for refill today: wafarin 4 mg and warfarin 5 mg; yes  Requested medication (s) are on the active medication list: yes  Last refill:  02/15/18  Future visit scheduled: yes  Notes to clinic:  anticoagulation    Requested Prescriptions  Pending Prescriptions Disp Refills   warfarin (COUMADIN) 5 MG tablet [Pharmacy Med Name: WARFARIN SODIUM 5 MG TABLET] 90 tablet 1    Sig: TAKE 1 TABLET BY MOUTH EVERY DAY     Hematology:  Anticoagulants - warfarin Failed - 05/18/2018  2:06 AM      Failed - If the patient is managed by [facility] - route to their Pool. If not, forward to the provider.      Failed - INR in normal range and within 30 days    INR  Date Value Ref Range Status  04/09/2018 2.3 (H) 0.9 - 1.1 Final         Passed - Valid encounter within last 3 months    Recent Outpatient Visits          1 month ago Chronic atrial fibrillation   [facility] Crissman, Redge Gainer, MD   2 months ago Chronic atrial fibrillation   [facility] Crissman, Redge Gainer, MD   5 months ago S/P aortic valve replacement with metallic valve   [facility] Crissman, Redge Gainer, MD   8 months ago Essential hypertension   [facility] Crissman, Redge Gainer, MD   9 months ago Chronic atrial fibrillation [facility])   [facility] Crissman, Redge Gainer, MD      Future Appointments            In 6 days Crissman, Redge Gainer, MD [facility]          warfarin (COUMADIN) 4 MG tablet [Pharmacy Med Name: WARFARIN SODIUM 4 MG TABLET] 90 tablet 1    Sig: TAKE 1 TABLET BY MOUTH EVERY DAY     Hematology:  Anticoagulants - warfarin Failed - 05/18/2018  2:06 AM      Failed - If the patient is managed by [facility] - route to their Pool. If not, forward to the provider.      Failed - INR in normal range and within 30 days    INR  Date Value Ref Range Status  04/09/2018 2.3 (H) 0.9 - 1.1 Final         Passed -  Valid encounter within last 3 months    Recent Outpatient Visits          1 month ago Chronic atrial fibrillation   [facility] Crissman, Redge Gainer, MD   2 months ago Chronic atrial fibrillation   [facility] Crissman, Redge Gainer, MD   5 months ago S/P aortic valve replacement with metallic valve   [facility] Crissman, Redge Gainer, MD   8 months ago Essential hypertension   [facility] Crissman, Redge Gainer, MD   9 months ago Chronic atrial fibrillation [facility])   [facility] Crissman, Redge Gainer, MD      Future Appointments            In 6 days Crissman, Redge Gainer, MD [facility], [facility]

## 2018-05-24 ENCOUNTER — Encounter: Payer: Self-pay | Admitting: Family Medicine

## 2018-05-24 ENCOUNTER — Ambulatory Visit (INDEPENDENT_AMBULATORY_CARE_PROVIDER_SITE_OTHER): Payer: Self-pay | Admitting: Family Medicine

## 2018-05-24 VITALS — BP 130/78 | HR 82 | Temp 97.8°F | Ht 69.0 in | Wt 254.5 lb

## 2018-05-24 DIAGNOSIS — I10 Essential (primary) hypertension: Secondary | ICD-10-CM

## 2018-05-24 DIAGNOSIS — I482 Chronic atrial fibrillation, unspecified: Secondary | ICD-10-CM

## 2018-05-24 DIAGNOSIS — E119 Type 2 diabetes mellitus without complications: Secondary | ICD-10-CM

## 2018-05-24 DIAGNOSIS — Z794 Long term (current) use of insulin: Secondary | ICD-10-CM

## 2018-05-24 MED ORDER — WARFARIN SODIUM 10 MG PO TABS: 10.0000 mg | ORAL_TABLET | Freq: Every day | ORAL | 3 refills | Status: DC

## 2018-05-24 NOTE — Assessment & Plan Note (Signed)
Discussed diabetes extremely poor control and patient not using any injectables due to cost and no insurance. Reviewed expected bad outcomes. Will investigate various options for pharmaceuticals and patient assistance. 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

## 2018-05-24 NOTE — Progress Notes (Signed)
BP 130/78 (BP Location: Left Arm)   Pulse 82   Temp 97.8 F (36.6 C)   Ht 5\' 9"  (1.753 m)   Wt 254 lb 8 oz (115.4 kg)   SpO2 96%   BMI 37.58 kg/m    Subjective:    Patient ID: Rodney Chary [name], male    DOB: June 23, 1960, 58 y.o.   MRN: [id number]  HPI: Rodney Oftedahl. is a 58 y.o. male  Chief Complaint  Patient presents with  . PTINR  . Hypertension  Patient unable to take amlodipine due to side effects.  But blood pressures doing okay. Patient's diabetes doing poorly is not doing any injectables because of the cost.  Patient with no insurance and only takes an expensive medicines does state he is taking his warfarin 9 mg a day.  Relevant past medical, surgical, family and social history reviewed and updated as indicated. Interim medical history since our last visit reviewed. Allergies and medications reviewed and updated.  Review of Systems  Constitutional: Negative.   Respiratory: Negative.   Cardiovascular: Negative.     Per HPI unless specifically indicated above     Objective:    BP 130/78 (BP Location: Left Arm)   Pulse 82   Temp 97.8 F (36.6 C)   Ht 5\' 9"  (1.753 m)   Wt 254 lb 8 oz (115.4 kg)   SpO2 96%   BMI 37.58 kg/m   Wt Readings from Last 3 Encounters:  05/24/18 254 lb 8 oz (115.4 kg)  04/09/18 254 lb (115.2 kg)  03/08/18 260 lb (117.9 kg)    Physical Exam Constitutional:      Appearance: He is well-developed.  HENT:     Head: Normocephalic and atraumatic.  Eyes:     Conjunctiva/sclera: Conjunctivae normal.  Neck:     Musculoskeletal: Normal range of motion.  Cardiovascular:     Rate and Rhythm: Normal rate and regular rhythm.     Heart sounds: Normal heart sounds.  Pulmonary:     Effort: Pulmonary effort is normal.     Breath sounds: Normal breath sounds.  Musculoskeletal: Normal range of motion.  Skin:    Findings: No erythema.  Neurological:     Mental Status: He is alert and oriented to person, place, and time.  Psychiatric:         Behavior: Behavior normal.        Thought Content: Thought content normal.        Judgment: Judgment normal.     Results for orders placed or performed in visit on 04/09/18  CoaguChek XS/INR Waived  Result Value Ref Range   INR 2.3 (H) 0.9 - 1.1   Prothrombin Time 28.0 sec      Assessment & Plan:   Problem List Items Addressed This Visit      Cardiovascular and Mediastinum   Chronic atrial fibrillation - Primary    Discuss INR too low at 2.1 will increase to 9 mg alternating with 10 and recheck INR 1 month      Relevant Medications   warfarin (COUMADIN) 10 MG tablet   Other Relevant Orders   CoaguChek XS/INR Waived   Essential (primary) hypertension    The current medical regimen is effective;  continue present plan and medications.       Relevant Medications   warfarin (COUMADIN) 10 MG tablet     Endocrine   Type 2 diabetes mellitus without complications (HCC)    Discussed diabetes extremely poor control  and patient not using any injectables due to cost and no insurance. Reviewed expected bad outcomes. Will investigate various options for pharmaceuticals and patient assistance. 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          Follow up plan: Return in about 4 weeks (around 06/21/2018) for PT INR, Hemoglobin A1c.

## 2018-05-24 NOTE — Assessment & Plan Note (Signed)
Discuss INR too low at 2.1 will increase to 9 mg alternating with 10 and recheck INR 1 month

## 2018-05-24 NOTE — Assessment & Plan Note (Signed)
The current medical regimen is effective;  continue present plan and medications.  

## 2018-05-25 LAB — COAGUCHEK XS/INR WAIVED
INR: 2.2 — ABNORMAL HIGH (ref 0.9–1.1)
Prothrombin Time: 26.7 s

## 2018-07-02 ENCOUNTER — Other Ambulatory Visit: Payer: Self-pay | Admitting: Family Medicine

## 2018-07-12 ENCOUNTER — Ambulatory Visit (INDEPENDENT_AMBULATORY_CARE_PROVIDER_SITE_OTHER): Payer: Self-pay | Admitting: Family Medicine

## 2018-07-12 ENCOUNTER — Encounter: Payer: Self-pay | Admitting: Family Medicine

## 2018-07-12 VITALS — BP 160/90 | HR 80 | Temp 97.9°F | Ht 69.0 in | Wt 250.1 lb

## 2018-07-12 DIAGNOSIS — Z794 Long term (current) use of insulin: Secondary | ICD-10-CM

## 2018-07-12 DIAGNOSIS — I482 Chronic atrial fibrillation, unspecified: Secondary | ICD-10-CM

## 2018-07-12 DIAGNOSIS — Z91148 Patient's other noncompliance with medication regimen for other reason: Secondary | ICD-10-CM | POA: Insufficient documentation

## 2018-07-12 DIAGNOSIS — E119 Type 2 diabetes mellitus without complications: Secondary | ICD-10-CM

## 2018-07-12 DIAGNOSIS — I10 Essential (primary) hypertension: Secondary | ICD-10-CM

## 2018-07-12 DIAGNOSIS — E1169 Type 2 diabetes mellitus with other specified complication: Secondary | ICD-10-CM

## 2018-07-12 DIAGNOSIS — Z9114 Patient's other noncompliance with medication regimen: Secondary | ICD-10-CM

## 2018-07-12 LAB — BAYER DCA HB A1C WAIVED: HB A1C (BAYER DCA - WAIVED): 11.3 % — ABNORMAL HIGH (ref <7.0)

## 2018-07-12 LAB — COAGUCHEK XS/INR WAIVED
INR: 3 — ABNORMAL HIGH (ref 0.9–1.1)
Prothrombin Time: 36.1 s

## 2018-07-12 MED ORDER — AZITHROMYCIN 250 MG PO TABS: ORAL_TABLET | ORAL | 0 refills | Status: DC

## 2018-07-12 NOTE — Progress Notes (Signed)
BP (!) 160/90 (BP Location: Left Arm, Patient Position: Sitting, Cuff Size: Normal)   Pulse 80   Temp 97.9 F (36.6 C)   Ht 5\' 9"  (1.753 m)   Wt 250 lb 2 oz (113.5 kg)   SpO2 95%   BMI 36.94 kg/m    Subjective:    Patient ID: Rodney Chary [name], male    DOB: Jun 13, 1960, 58 y.o.   MRN: [id number]  HPI: Rodney Fugua. is a 58 y.o. male  Chief Complaint  Patient presents with  . PTINR  . Cough    Since Sunday, productive at times  Patient all in all doing well except developed cold sinus congestion pressure drainage seems to be getting somewhat worse. Patient doing well with warfarin no complaints taking the medicine faithfully no bleeding bruising issues.  Diabetes patient reluctantly limited medications because he does not have insurance.  Or 2 [facility] want to refer him to endocrinology but patient refuses because of insurance and limitations with mind his medications.  Reviewed insurance options reviewed risk benefit of treatment nontreatment of diabetes with patient.  Relevant past medical, surgical, family and social history reviewed and updated as indicated. Interim medical history since our last visit reviewed. Allergies and medications reviewed and updated.  Review of Systems  Constitutional: Negative.   Respiratory: Negative.   Cardiovascular: Negative.     Per HPI unless specifically indicated above     Objective:    BP (!) 160/90 (BP Location: Left Arm, Patient Position: Sitting, Cuff Size: Normal)   Pulse 80   Temp 97.9 F (36.6 C)   Ht 5\' 9"  (1.753 m)   Wt 250 lb 2 oz (113.5 kg)   SpO2 95%   BMI 36.94 kg/m   Wt Readings from Last 3 Encounters:  07/12/18 250 lb 2 oz (113.5 kg)  05/24/18 254 lb 8 oz (115.4 kg)  04/09/18 254 lb (115.2 kg)    Physical Exam Constitutional:      Appearance: He is well-developed.  HENT:     Head: Normocephalic and atraumatic.  Eyes:     Conjunctiva/sclera: Conjunctivae normal.  Neck:     Musculoskeletal: Normal range  of motion.  Cardiovascular:     Rate and Rhythm: Normal rate and regular rhythm.     Heart sounds: Normal heart sounds.  Pulmonary:     Effort: Pulmonary effort is normal.     Breath sounds: Normal breath sounds.  Musculoskeletal: Normal range of motion.  Skin:    Findings: No erythema.  Neurological:     Mental Status: He is alert and oriented to person, place, and time.  Psychiatric:        Behavior: Behavior normal.        Thought Content: Thought content normal.        Judgment: Judgment normal.     Results for orders placed or performed in visit on 05/24/18  CoaguChek XS/INR Waived  Result Value Ref Range   INR 2.2 (H) 0.9 - 1.1   Prothrombin Time 26.7 sec      Assessment & Plan:   Problem List Items Addressed This Visit      Cardiovascular and Mediastinum   Chronic atrial fibrillation - Primary    The current medical regimen is effective;  continue present plan and medications.       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        Other   Morbid obesity (HCC)    Discussed weight loss      Noncompliance w/medication treatment due to intermit use of medication    Discussed diabetes care and treatment importance of medication for control Also discussed weight loss poor control          Follow up plan: Return in about 4 weeks (around 08/09/2018) for PT INR.

## 2018-07-12 NOTE — Assessment & Plan Note (Signed)
Discussed weight loss. 

## 2018-07-12 NOTE — Assessment & Plan Note (Signed)
Discussed diabetes care and treatment importance of medication for control Also discussed weight loss poor control

## 2018-07-12 NOTE — Assessment & Plan Note (Signed)
Still elevated discussed medications diet nutrition

## 2018-07-12 NOTE — Assessment & Plan Note (Signed)
Reviewed complications from untreated diabetes with patient discussed cheapest way to treat diabetes is significant weight loss discussed weight loss techniques with patient discussed better diabetes control and use of medications.

## 2018-07-12 NOTE — Assessment & Plan Note (Signed)
The current medical regimen is effective;  continue present plan and medications.  

## 2018-07-16 NOTE — Addendum Note (Signed)
Addended by: Vonita Moss A on: 07/16/2018 11:16 AM   Modules accepted: Orders

## 2018-07-18 ENCOUNTER — Ambulatory Visit: Payer: Self-pay | Admitting: Pharmacist

## 2018-07-18 ENCOUNTER — Other Ambulatory Visit: Payer: Self-pay | Admitting: Family Medicine

## 2018-07-18 DIAGNOSIS — E119 Type 2 diabetes mellitus without complications: Secondary | ICD-10-CM

## 2018-07-18 DIAGNOSIS — Z794 Long term (current) use of insulin: Principal | ICD-10-CM

## 2018-07-18 MED ORDER — SEMAGLUTIDE (1 MG/DOSE) 2 MG/1.5ML ~~LOC~~ SOPN: 1.0000 mg | PEN_INJECTOR | SUBCUTANEOUS | 4 refills | Status: DC

## 2018-07-18 MED ORDER — INSULIN GLULISINE 100 UNIT/ML SOLOSTAR PEN: 50.0000 [IU] | PEN_INJECTOR | Freq: Two times a day (BID) | SUBCUTANEOUS | 12 refills | Status: DC

## 2018-07-18 MED ORDER — INSULIN DEGLUDEC 100 UNIT/ML ~~LOC~~ SOPN: 25.0000 [IU] | PEN_INJECTOR | Freq: Every day | SUBCUTANEOUS | 12 refills | Status: DC

## 2018-07-18 MED ORDER — DAPAGLIFLOZIN PROPANEDIOL 10 MG PO TABS: 10.0000 mg | ORAL_TABLET | Freq: Every day | ORAL | 3 refills | Status: DC

## 2018-07-18 NOTE — Progress Notes (Signed)
Change pharm

## 2018-07-18 NOTE — Patient Instructions (Signed)
Visit Information  Goals Addressed            This Visit's Progress     Patient Stated   . "I can't afford my medications" (pt-stated)       Current Barriers:  . Financial Barriers- patient is uninsured and cannot afford brand name medications - has Ozempic samples, but cannot afford Jimmie Molly, or Farxiga   Pharmacist Clinical Goal(s):  Marland Kitchen Over the next 14 days, patient will work with PharmD to address needs related to medication access  Interventions: . Comprehensive medication review performed. . Collaboration with provider re: medication management- As patient is uninsured, he may be able to receive medications from [facility]; will ask Dr. Dossie Arbour to send orders for Franki Monte Jimmie Molly, and Farxiga to [facility]  Patient Self Care Activities:  . Currently UNABLE TO manage medications or afford medications  Plan: . Patient will contact [facility] to determine what financial documentation will need to be provided . PharmD will ask Dr. Dossie Arbour to send Franki Monte Jimmie Molly, and Farxiga prescriptions to [facility]  Initial goal documentation         The patient verbalized understanding of instructions provided today and declined a print copy of patient instruction materials.   - Will message Dr. Dossie Arbour regarding sending prescriptoins to [facility] - PharmD will follow up with patient next week regarding the process of getting set up with [facility]    Catie Feliz Beam, PharmD Clinical Pharmacist [facility] [phone]

## 2018-07-18 NOTE — Chronic Care Management (AMB) (Signed)
Chronic Care Management   Note  07/18/2018 Name: Rodney Dixon [name] MRN: [id number] DOB: 24-Aug-1960   Subjective:  Patient is a 58 year old male seen by Dr. Dossie Arbour for primary care, referred to chronic care management related to diabetes, HTN, s/p aortic valve replacement.   Contacted patient to discuss affordability concerns.     Review of patient status, including review of consultants reports, laboratory and other test data, was performed as part of comprehensive evaluation and provision of chronic care management services.   Objective: Lab Results  Component Value Date   CREATININE 0.98 03/05/2018   CREATININE 1.21 11/15/2016   CREATININE 1.01 07/25/2016    Lab Results  Component Value Date   HGBA1C 11.3 (H) 07/12/2018    Lipid Panel     Component Value Date/Time   CHOL 222 (H) 07/25/2016 0846   TRIG 459 (H) 07/25/2016 0846   HDL 30 (L) 07/25/2016 0846   CHOLHDL 7.4 (H) 07/25/2016 0846   LDLCALC Comment 07/25/2016 0846    BP Readings from Last 3 Encounters:  07/12/18 (!) 160/90  05/24/18 130/78  04/09/18 (!) 171/98    No Known Allergies  Medications Reviewed Today    Reviewed by Lourena Simmonds, [facility] (Pharmacist) on 07/18/18 at 1533  Med List Status: <None>  Medication Order Taking? Sig Documenting Provider Last Dose Status Informant        Discontinued 07/18/18 1533 (Completed Course)         Discontinued 07/18/18 1532 (Completed Course)   benazepril (LOTENSIN) 40 MG tablet [id number] No Take 1 tablet (40 mg total) by mouth daily.  Patient not taking:  Reported on 05/24/2018   Steele Sizer, MD Not Taking Active   carvedilol (COREG) 25 MG tablet [id number] Yes Take 1 tablet (25 mg total) by mouth 2 (two) times daily with a meal. Crissman, Redge Gainer, MD Taking Active   citalopram (CELEXA) 20 MG tablet [id number] No Take 1 tablet (20 mg total) by mouth daily.  Patient not taking:  Reported on 07/18/2018   Steele Sizer, MD Not Taking Active    cyclobenzaprine (FLEXERIL) 10 MG tablet [id number] No Take 1 tablet (10 mg total) by mouth 3 (three) times daily as needed for muscle spasms.  Patient not taking:  Reported on 07/18/2018   Steele Sizer, MD Not Taking Active   dapagliflozin propanediol (FARXIGA) 10 MG TABS tablet [id number] No Take 10 mg by mouth daily.  Patient not taking:  Reported on 04/09/2018   Steele Sizer, MD Not Taking Active   fenofibrate 160 MG tablet [id number] No TAKE 1 TABLET BY MOUTH EVERY DAY  Patient not taking:  Reported on 07/18/2018   Steele Sizer, MD Not Taking Active   gabapentin (NEURONTIN) 300 MG capsule [id number] Yes Take 2 capsules (600 mg total) by mouth 2 (two) times daily. Steele Sizer, MD Taking Active            Med Note Feliz Beam, Arie Sabina   Wed Jul 18, 2018  3:31 PM) Taking 2 QAM  hydrochlorothiazide (HYDRODIURIL) 25 MG tablet [id number] Yes Take 1 tablet (25 mg total) by mouth daily. Steele Sizer, MD Taking Active   Insulin Glargine (TOUJEO SOLOSTAR) 300 UNIT/ML SOPN [id number] No Inject 85 Units into the skin daily.  Patient not taking:  Reported on 04/09/2018   Steele Sizer, MD Not Taking Active   Insulin Glulisine (APIDRA) 100 UNIT/ML Solostar Pen [id number] No Inject into the skin. [provider] Not  Taking Active   metFORMIN (GLUCOPHAGE) 500 MG tablet [id number] Yes TAKE 2 TABLETS BY MOUTH TWICE A DAY Crissman, Mark A, MD Taking Active   Semaglutide, 1 MG/DOSE, (OZEMPIC, 1 MG/DOSE,) 2 MG/1.5ML SOPN [id number] Yes Inject 1 mg into the skin once a week. Steele Sizer, MD Taking Active            Med Note Feliz Beam, Arie Sabina   Wed Jul 18, 2018  3:30 PM) 0.5 once weekly  warfarin (COUMADIN) 10 MG tablet [id number] Yes Take 1 tablet (10 mg total) by mouth daily. Take as directed Steele Sizer, MD Taking Active   warfarin (COUMADIN) 4 MG tablet [id number] Yes TAKE 1 TABLET BY MOUTH EVERY DAY Particia Nearing, [place] Taking Active   warfarin (COUMADIN) 5 MG tablet  [id number] Yes TAKE 1 TABLET BY MOUTH EVERY DAY Particia Nearing, PA-C Taking Active            Assessment:    Goals Addressed            This Visit's Progress     Patient Stated   . "I can't afford my medications" (pt-stated)       Current Barriers:  . Financial Barriers- patient is uninsured and cannot afford brand name medications - has Ozempic samples, but cannot afford Jimmie Molly, or Farxiga   Pharmacist Clinical Goal(s):  Marland Kitchen Over the next 14 days, patient will work with PharmD to address needs related to medication access  Interventions: . Comprehensive medication review performed. . Collaboration with provider re: medication management- As patient is uninsured, he may be able to receive medications from [facility]; will ask Dr. Dossie Arbour to send orders for Franki Monte Jimmie Molly, and Farxiga to [facility]  Patient Self Care Activities:  . Currently UNABLE TO manage medications or afford medications  Plan: . Patient will contact [facility] to determine what financial documentation will need to be provided . PharmD will ask Dr. Dossie Arbour to send Franki Monte Jimmie Molly, and Farxiga prescriptions to [facility]  Initial goal documentation       Plan - Will message Dr. Dossie Arbour regarding sending prescriptoins to [facility] - PharmD will follow up with patient next week regarding the process of getting set up with [facility]   Catie Feliz Beam, PharmD Clinical Pharmacist [facility] [phone]

## 2018-07-19 ENCOUNTER — Ambulatory Visit (INDEPENDENT_AMBULATORY_CARE_PROVIDER_SITE_OTHER): Payer: Self-pay | Admitting: Family Medicine

## 2018-07-19 ENCOUNTER — Encounter: Payer: Self-pay | Admitting: Family Medicine

## 2018-07-19 ENCOUNTER — Other Ambulatory Visit: Payer: Self-pay

## 2018-07-19 VITALS — BP 148/85 | HR 84 | Temp 98.2°F | Ht 69.0 in | Wt 249.5 lb

## 2018-07-19 DIAGNOSIS — I482 Chronic atrial fibrillation, unspecified: Secondary | ICD-10-CM

## 2018-07-19 DIAGNOSIS — E1169 Type 2 diabetes mellitus with other specified complication: Secondary | ICD-10-CM

## 2018-07-19 LAB — COAGUCHEK XS/INR WAIVED
INR: 2.8 — ABNORMAL HIGH (ref 0.9–1.1)
Prothrombin Time: 33.3 s

## 2018-07-19 LAB — GLUCOSE HEMOCUE WAIVED: Glu Hemocue Waived: 395 mg/dL — ABNORMAL HIGH (ref 65–99)

## 2018-07-19 MED ORDER — BLOOD GLUCOSE TEST VI STRP: 1.0000 | ORAL_STRIP | Freq: Every day | 12 refills | Status: DC

## 2018-07-19 NOTE — Progress Notes (Signed)
BP (!) 148/85 (BP Location: Left Arm, Patient Position: Sitting, Cuff Size: Large)   Pulse 84   Temp 98.2 F (36.8 C)   Ht 5\' 9"  (1.753 m)   Wt 249 lb 8 oz (113.2 kg)   SpO2 91%   BMI 36.84 kg/m    Subjective:    Patient ID: Rodney Chary [name], male    DOB: 1961/01/29, 58 y.o.   MRN: [id number]  HPI: Rodney Callow. is a 58 y.o. male  Chief Complaint  Patient presents with  . Dizziness    lightheaded, off balance, fatigued started this week. Gets really tired between 11am and 1pm  Rodney Dixon with ongoing symptoms as above.  On review patient not using any insulin.  Have arranged for consult with pharmacist which is been completed and patient's been approved for medication through the assistance clinic but has not filled out paperwork yet.  Patient has the paperwork. Patient taking his warfarin faithfully without problems. No bleeding or bruising  Relevant past medical, surgical, family and social history reviewed and updated as indicated. Interim medical history since our last visit reviewed. Allergies and medications reviewed and updated.  Review of Systems  Constitutional: Positive for activity change, diaphoresis and fatigue. Negative for appetite change and fever.  HENT: Negative.   Eyes:       Vision gets blurry at times  Respiratory: Negative.   Cardiovascular: Negative.   Gastrointestinal:       Frequent urination and thirsty    Per HPI unless specifically indicated above     Objective:    BP (!) 148/85 (BP Location: Left Arm, Patient Position: Sitting, Cuff Size: Large)   Pulse 84   Temp 98.2 F (36.8 C)   Ht 5\' 9"  (1.753 m)   Wt 249 lb 8 oz (113.2 kg)   SpO2 91%   BMI 36.84 kg/m   Wt Readings from Last 3 Encounters:  07/19/18 249 lb 8 oz (113.2 kg)  07/12/18 250 lb 2 oz (113.5 kg)  05/24/18 254 lb 8 oz (115.4 kg)    Physical Exam Constitutional:      Appearance: He is well-developed.  HENT:     Head: Normocephalic and atraumatic.  Eyes:   Conjunctiva/sclera: Conjunctivae normal.  Neck:     Musculoskeletal: Normal range of motion.  Cardiovascular:     Rate and Rhythm: Normal rate and regular rhythm.     Heart sounds: Normal heart sounds.  Pulmonary:     Effort: Pulmonary effort is normal.     Breath sounds: Normal breath sounds.  Musculoskeletal: Normal range of motion.  Skin:    Findings: No erythema.  Neurological:     Mental Status: He is alert and oriented to person, place, and time.  Psychiatric:        Behavior: Behavior normal.        Thought Content: Thought content normal.        Judgment: Judgment normal.   Glucose 395 INR 2.8   Results for orders placed or performed in visit on 07/12/18  Bayer DCA Hb A1c Waived  Result Value Ref Range   HB A1C (BAYER DCA - WAIVED) 11.3 (H) <7.0 %  CoaguChek XS/INR Waived  Result Value Ref Range   INR 3.0 (H) 0.9 - 1.1   Prothrombin Time 36.1 sec      Assessment & Plan:   Problem List Items Addressed This Visit      Cardiovascular and Mediastinum   Chronic atrial fibrillation  The current medical regimen is effective;  continue present plan and medications.       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  Discussed importance of insulin and glucose control for abatement of symptoms as patient has noted. Until patient is able to get his medication through the assistance clinic. Gave samples of Humalog 100 units gave 40 units in the office now.  Patient to take 40 units with breakfast and supper.  May need to adjust.  Gave patient test strips prescription also.      Relevant Orders   Glucose       Follow up plan: Return if symptoms worsen or fail to improve, for As scheduled.

## 2018-07-19 NOTE — Assessment & Plan Note (Signed)
The current medical regimen is effective;  continue present plan and medications.  

## 2018-07-19 NOTE — Assessment & Plan Note (Signed)
Discuss diabetes poor control lack of insulin diabetic ketoacidosis and hospitalization.  Discussed importance of insulin and glucose control for abatement of symptoms as patient has noted. Until patient is able to get his medication through the assistance clinic. Gave samples of Humalog 100 units gave 40 units in the office now.  Patient to take 40 units with breakfast and supper.  May need to adjust.  Gave patient test strips prescription also.

## 2018-07-25 ENCOUNTER — Ambulatory Visit: Payer: Self-pay | Admitting: Pharmacist

## 2018-07-25 DIAGNOSIS — E1169 Type 2 diabetes mellitus with other specified complication: Secondary | ICD-10-CM

## 2018-07-25 NOTE — Patient Instructions (Signed)
Visit Information  Goals Addressed            This Visit's Progress     Patient Stated   . "I can't afford my medications" (pt-stated)       Current Barriers:  . Financial Barriers- patient is uninsured and cannot afford brand name medications - has Ozempic samples, but cannot afford Tresiba, Apidra, or [place]  . Patient noted that he contacted [facility] for application forms, but he has not received them in the mail yet  Pharmacist Clinical Goal(s):  Marland Kitchen Over the next 14 days, patient will work with PharmD to address needs related to medication access  Interventions: . Comprehensive medication review performed. . Collaboration with provider re: medication management- They mailed forms last week. They recommended patient come directly to [facility] to pick up forms . Provided location and directions to patient  Patient Self Care Activities:  . Currently UNABLE TO manage medications or afford medications  Plan: . Patient will go to [facility] to pick up application; will complete and return to [facility]   Please see past updates related to this goal by clicking on the "Past Updates" button in the selected goal         The patient verbalized understanding of instructions provided today and declined a print copy of patient instruction materials.   Plan:  PharmD will outreach patient next week to inquire on progress with application process  Catie Feliz Beam, PharmD Clinical Pharmacist [facility] [phone]

## 2018-07-25 NOTE — Chronic Care Management (AMB) (Signed)
  Chronic Care Management   Follow Up Note   07/25/2018 Name: JMARCUS BLOEMKER [name] MRN: [id number] DOB: [birth year]  Referred by: Steele Sizer, MD Reason for referral : Chronic Care Management (Medication Management)   [name] is a 58 y.o. year old male who is a primary care patient of Crissman, Redge Gainer, MD. The CCM team was consulted for assistance with chronic disease management and care coordination needs.    Review of patient status, including review of consultants reports, relevant laboratory and other test results, and collaboration with appropriate care team members and the patient's provider was performed as part of comprehensive patient evaluation and provision of chronic care management services.    Goals Addressed            This Visit's Progress     Patient Stated   . "I can't afford my medications" (pt-stated)       Current Barriers:  . Financial Barriers- patient is uninsured and cannot afford brand name medications - has Ozempic samples, but cannot afford Tresiba, Apidra, or [place]  . Patient noted that he contacted [facility] for application forms, but he has not received them in the mail yet  Pharmacist Clinical Goal(s):  Marland Kitchen Over the next 14 days, patient will work with PharmD to address needs related to medication access  Interventions: . Comprehensive medication review performed. . Collaboration with provider re: medication management- They mailed forms last week. They recommended patient come directly to [facility] to pick up forms . Provided location and directions to patient  Patient Self Care Activities:  . Currently UNABLE TO manage medications or afford medications  Plan: . [facility] will [facility] to [facility] to pick up application; will complete and return to [facility]   Please see past updates related to this goal by clicking on the "Past Updates" button in the selected goal          Plan - PharmD will outreach  patient next week to inquire on progress with application process   Catie Feliz Beam, PharmD Clinical Pharmacist [facility] Practice/[facility] [phone]

## 2018-08-10 ENCOUNTER — Other Ambulatory Visit: Payer: Self-pay | Admitting: Family Medicine

## 2018-08-14 ENCOUNTER — Telehealth: Payer: Self-pay | Admitting: Family Medicine

## 2018-08-14 NOTE — Telephone Encounter (Signed)
Called pt to let him know appt would be telephone visit no answer left voicemail.

## 2018-08-15 ENCOUNTER — Other Ambulatory Visit: Payer: Self-pay

## 2018-08-15 ENCOUNTER — Ambulatory Visit: Payer: Self-pay | Admitting: Family Medicine

## 2018-08-15 ENCOUNTER — Telehealth: Payer: Self-pay

## 2018-08-15 DIAGNOSIS — I482 Chronic atrial fibrillation, unspecified: Secondary | ICD-10-CM

## 2018-08-15 LAB — COAGUCHEK XS/INR WAIVED
INR: 3.1 — ABNORMAL HIGH (ref 0.9–1.1)
Prothrombin Time: 36.7 s

## 2018-08-15 NOTE — Telephone Encounter (Signed)
Patient self pay and does not want to do telephone visit. Agreed to do PT/INR and for Dr. Dossie Arbour to call him with results. Results not in computer system till later, but instantly obtained from lab and written down. The result is 3.1 in 36.7 seconds. Routing to provider.   If patient needs follow-up, I will call him and schedule him with someone in office.

## 2018-08-21 ENCOUNTER — Other Ambulatory Visit: Payer: Self-pay | Admitting: Family Medicine

## 2018-08-31 ENCOUNTER — Telehealth: Payer: Self-pay | Admitting: Pharmacy Technician

## 2018-08-31 NOTE — Telephone Encounter (Signed)
Provided patient with new patient packet to obtain [facility] services.  Patient understands that [facility] must receive 2019 financial documentation in order to determine eligibility.  Betty J. Kluttz Care Manager [facility] 

## 2018-09-14 ENCOUNTER — Ambulatory Visit: Payer: Self-pay | Admitting: Pharmacist

## 2018-09-14 DIAGNOSIS — E1169 Type 2 diabetes mellitus with other specified complication: Secondary | ICD-10-CM

## 2018-09-14 NOTE — Patient Instructions (Signed)
Visit Information  Goals Addressed            This Visit's Progress     Patient Stated   . "I can't afford my medications" (pt-stated)       Current Barriers:  . Financial Barriers- patient is uninsured and cannot afford brand name medications  . [facility] noted to have mailed assistance application form to patient, but he reports that he never received it. I had given him the pharmacy address and he was going to go by and pick up a form, but he hasn't yet.   Pharmacist Clinical Goal(s):  Marland Kitchen Over the next 14 days, patient will work with PharmD to address needs related to medication access  Interventions: . Provided patient with address for [facility]. Sent message to [facility] pharmacist alerting her that patient never received mailed application, and that he plans to come by and pick up an application  Patient Self Care Activities:  . Currently UNABLE TO manage medications or afford medications  Plan: . Patient will go to [facility] to pick up application; will complete and return to [facility]   Please see past updates related to this goal by clicking on the "Past Updates" button in the selected goal         The patient verbalized understanding of instructions provided today and declined a print copy of patient instruction materials.   Plan:  - Will follow up in 1-2 weeks to coordinate care  Catie Feliz Beam, PharmD Clinical Pharmacist [facility] Practice/[facility] [phone]

## 2018-09-14 NOTE — Chronic Care Management (AMB) (Signed)
  Chronic Care Management   Follow Up Note   09/14/2018 Name: Rodney WENIGER [name] MRN: [id number] DOB: 11-Jul-1960  Referred by: Steele Sizer, MD Reason for referral : Chronic Care Management (Medication Access)   Rodney Chary [name] is a 58 y.o. year old male who is a primary care patient of Crissman, Redge Gainer, MD. The CCM team was consulted for assistance with chronic disease management and care coordination needs.    Contacted patient to follow up on status of receiving medication assistance from [facility].   Review of patient status, including review of consultants reports, relevant laboratory and other test results, and collaboration with appropriate care team members and the patient's provider was performed as part of comprehensive patient evaluation and provision of chronic care management services.    Goals Addressed            This Visit's Progress     Patient Stated   . "I can't afford my medications" (pt-stated)       Current Barriers:  . Financial Barriers- patient is uninsured and cannot afford brand name medications  . [facility] noted to have mailed assistance application form to patient, but he reports that he never received it. I had given him the pharmacy address and he was going to go by and pick up a form, but he hasn't yet.   Pharmacist Clinical Goal(s):  Marland Kitchen Over the next 14 days, patient will work with PharmD to address needs related to medication access  Interventions: . Provided patient with [facility] for [facility]. Sent message to [facility] pharmacist alerting her that patient never received mailed application, and that he plans to come by and pick up an application  Patient Self Care Activities:  . Currently UNABLE TO manage medications or afford medications  Plan: . Patient will go to [facility] to pick up application; will complete and return to [facility]   Please see past updates related to  this goal by clicking on the "Past Updates" button in the selected goal         Plan:  - Will follow up in 1-2 weeks to coordinate care  Catie Feliz Beam, PharmD Clinical Pharmacist [facility] Practice/[facility] [phone]

## 2018-09-18 ENCOUNTER — Telehealth: Payer: Self-pay

## 2018-09-20 ENCOUNTER — Other Ambulatory Visit: Payer: Self-pay | Admitting: Family Medicine

## 2018-09-20 NOTE — Telephone Encounter (Signed)
Requested medication (s) are due for refill today: Yes  Requested medication (s) are on the active medication list: Yes  Last refill:  08/13/18  Future visit scheduled: Yes  Notes to clinic:  Asking for 90 refill    Requested Prescriptions  Pending Prescriptions Disp Refills   gabapentin (NEURONTIN) 300 MG capsule [Pharmacy Med Name: GABAPENTIN 300 MG CAPSULE] 360 capsule 2    Sig: TAKE 2 CAPSULES (600 MG TOTAL) BY MOUTH 2 (TWO) TIMES DAILY.     Neurology: Anticonvulsants - gabapentin Passed - 09/20/2018  9:35 AM      Passed - Valid encounter within last 12 months    Recent Outpatient Visits          2 months ago Diabetes mellitus associated with hormonal etiology [facility])   [facility], Redge Gainer, MD   2 months ago Chronic atrial fibrillation   [facility] [place], Redge Gainer, MD   3 months ago Chronic atrial fibrillation   [facility], Redge Gainer, MD   5 months ago Chronic atrial fibrillation   [facility], Redge Gainer, MD   6 months ago Chronic atrial fibrillation   [facility] [place], Redge Gainer, MD

## 2018-09-25 ENCOUNTER — Telehealth: Payer: Self-pay

## 2018-09-26 ENCOUNTER — Ambulatory Visit (INDEPENDENT_AMBULATORY_CARE_PROVIDER_SITE_OTHER): Payer: Self-pay | Admitting: Family Medicine

## 2018-09-26 ENCOUNTER — Other Ambulatory Visit: Payer: Self-pay

## 2018-09-26 ENCOUNTER — Encounter: Payer: Self-pay | Admitting: Family Medicine

## 2018-09-26 DIAGNOSIS — Z954 Presence of other heart-valve replacement: Secondary | ICD-10-CM

## 2018-09-26 DIAGNOSIS — E1169 Type 2 diabetes mellitus with other specified complication: Secondary | ICD-10-CM

## 2018-09-26 DIAGNOSIS — I482 Chronic atrial fibrillation, unspecified: Secondary | ICD-10-CM

## 2018-09-26 NOTE — Assessment & Plan Note (Signed)
Checking INR today

## 2018-09-26 NOTE — Assessment & Plan Note (Signed)
Checking INR

## 2018-09-26 NOTE — Progress Notes (Signed)
   There were no vitals taken for this visit.   Subjective:    Patient ID: Rodney Chary [name], male    DOB: 02/10/1961, 58 y.o.   MRN: [id number]  HPI: Rodney Dixon. is a 58 y.o. male  Med check  Telemedicine using audio/video telecommunications for a synchronous communication visit. Today's visit due to COVID-19 isolation precautions I connected with and verified that I am speaking with the correct person using two identifiers.   I discussed the limitations, risks, security and privacy concerns of performing an evaluation and management service by telecommunication and the availability of in person appointments. I also discussed with the patient that there may be a patient responsible charge related to this service. The patient expressed understanding and agreed to proceed. The patient's location is work. I am at home.  Discussed with patient still has not picked up his insulin has not picked up information to get pharmacy benefits and develop applications for pharmacy benefits.  Discussed gust patient says he just forgot.  Reviewed with patient ravages of diabetes being untreated and uncontrolled which patient indicates he understands as he is having difficulty controlling his hands.  Reviewed medication again with patient and he indicates he understands and will go by and get paperwork today and start filling it out and get it returned today. No bleeding bruising issues. Taking warfarin without problems will have INR done later this morning. Relevant past medical, surgical, family and social history reviewed and updated as indicated. Interim medical history since our last visit reviewed. Allergies and medications reviewed and updated.  Review of Systems  Constitutional: Negative.   Respiratory: Negative.   Cardiovascular: Negative.     Per HPI unless specifically indicated above     Objective:    There were no vitals taken for this visit.  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        Other   S/P aortic valve replacement with metallic valve    Checking INR          Follow up plan: Return in about 4 weeks (around 10/24/2018) for Hemoglobin A1c, PT INR,BMP.

## 2018-09-26 NOTE — Assessment & Plan Note (Signed)
Reviewed importance of control and taking medications patient to pick up patient assistance forms from pharmacy today.

## 2018-10-04 ENCOUNTER — Ambulatory Visit (INDEPENDENT_AMBULATORY_CARE_PROVIDER_SITE_OTHER): Payer: Self-pay | Admitting: Family Medicine

## 2018-10-04 ENCOUNTER — Other Ambulatory Visit: Payer: Self-pay

## 2018-10-04 ENCOUNTER — Encounter: Payer: Self-pay | Admitting: Family Medicine

## 2018-10-04 DIAGNOSIS — E785 Hyperlipidemia, unspecified: Secondary | ICD-10-CM

## 2018-10-04 DIAGNOSIS — I10 Essential (primary) hypertension: Secondary | ICD-10-CM

## 2018-10-04 DIAGNOSIS — I482 Chronic atrial fibrillation, unspecified: Secondary | ICD-10-CM

## 2018-10-04 DIAGNOSIS — Z954 Presence of other heart-valve replacement: Secondary | ICD-10-CM

## 2018-10-04 LAB — COAGUCHEK XS/INR WAIVED
INR: 2 — ABNORMAL HIGH (ref 0.9–1.1)
Prothrombin Time: 23.6 s

## 2018-10-04 NOTE — Assessment & Plan Note (Signed)
The current medical regimen is effective;  continue present plan and medications.  

## 2018-10-04 NOTE — Progress Notes (Signed)
   There were no vitals taken for this visit.   Subjective:    Patient ID: Rodney Chary [name], male    DOB: 1960-10-12, 58 y.o.   MRN: [id number]  HPI: Rodney Huneke. is a 58 y.o. male  INR check and meds  Telemedicine using audio/video telecommunications for a synchronous communication visit. Today's visit due to COVID-19 isolation precautions I connected with and verified that I am speaking with the correct person using two identifiers.   I discussed the limitations, risks, security and privacy concerns of performing an evaluation and management service by telecommunication and the availability of in person appointments. I also discussed with the patient that there may be a patient responsible charge related to this service. The patient expressed understanding and agreed to proceed. The patient's location is work I am at home. Discussed with patient INR 2 will continue warfarin just the same no complaints or problems bleeding bruising issues from warfarin. Discussed diabetes patient still has not got his injectables yet as has not taken his application back to the clinic.  Discussed issues with control of diabetes patient indicates he understands and will comply. Blood pressure other medications stable.  Relevant past medical, surgical, family and social history reviewed and updated as indicated. Interim medical history since our last visit reviewed. Allergies and medications reviewed and updated.  Review of Systems  Constitutional: Negative.   Respiratory: Negative.   Cardiovascular: Negative.     Per HPI unless specifically indicated above     Objective:    There were no vitals taken for this visit.  Wt Readings from Last 3 Encounters:  07/19/18 249 lb 8 oz (113.2 kg)  07/12/18 250 lb 2 oz (113.5 kg)  05/24/18 254 lb 8 oz (115.4 kg)    Physical Exam  Results for orders placed or performed in visit on 08/15/18  CoaguChek XS/INR Waived  Result Value Ref Range   INR  3.1 (H) 0.9 - 1.1   Prothrombin Time 36.7 sec      Assessment & Plan:   Problem List Items Addressed This Visit      Cardiovascular and Mediastinum   Chronic atrial fibrillation    The current medical regimen is effective;  continue present plan and medications.       Relevant Orders   CoaguChek XS/INR Waived   Essential (primary) hypertension    The current medical regimen is effective;  continue present plan and medications.         Other   S/P aortic valve replacement with metallic valve   Hyperlipidemia    The current medical regimen is effective;  continue present plan and medications.         I discussed the assessment and treatment plan with the patient. The patient was provided an opportunity to ask questions and all were answered. The patient agreed with the plan and demonstrated an understanding of the instructions.   The patient was advised to call back or seek an in-person evaluation if the symptoms worsen or if the condition fails to improve as anticipated.   I provided 21+ minutes of time during this encounter.  Follow up plan: Return in about 4 weeks (around 11/01/2018) for PT INR.

## 2018-10-06 IMAGING — CR DG RIBS W/ CHEST 3+V*L*
1 series · 5 of 5 positions shown · non-contrast
Comparison: Chest radiograph 01/24/2005

CLINICAL DATA: Left axillary rib pain for 3-4 days.

EXAM:
LEFT RIBS AND CHEST - 3+ VIEW

[Series 1: dg ribs unilateral w/chest left · 0.14mm/px · 5 of 5 slices shown]
[im 1/5]
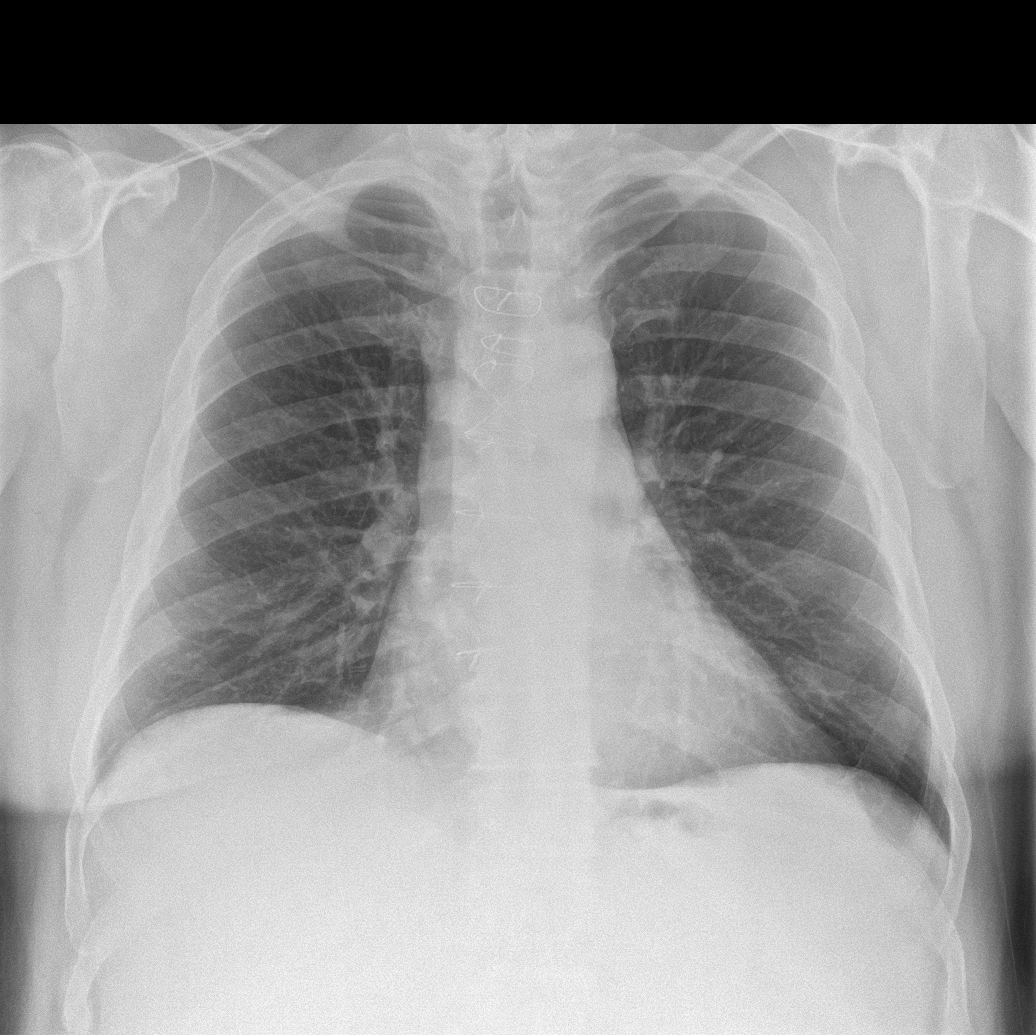
[im 2/5]
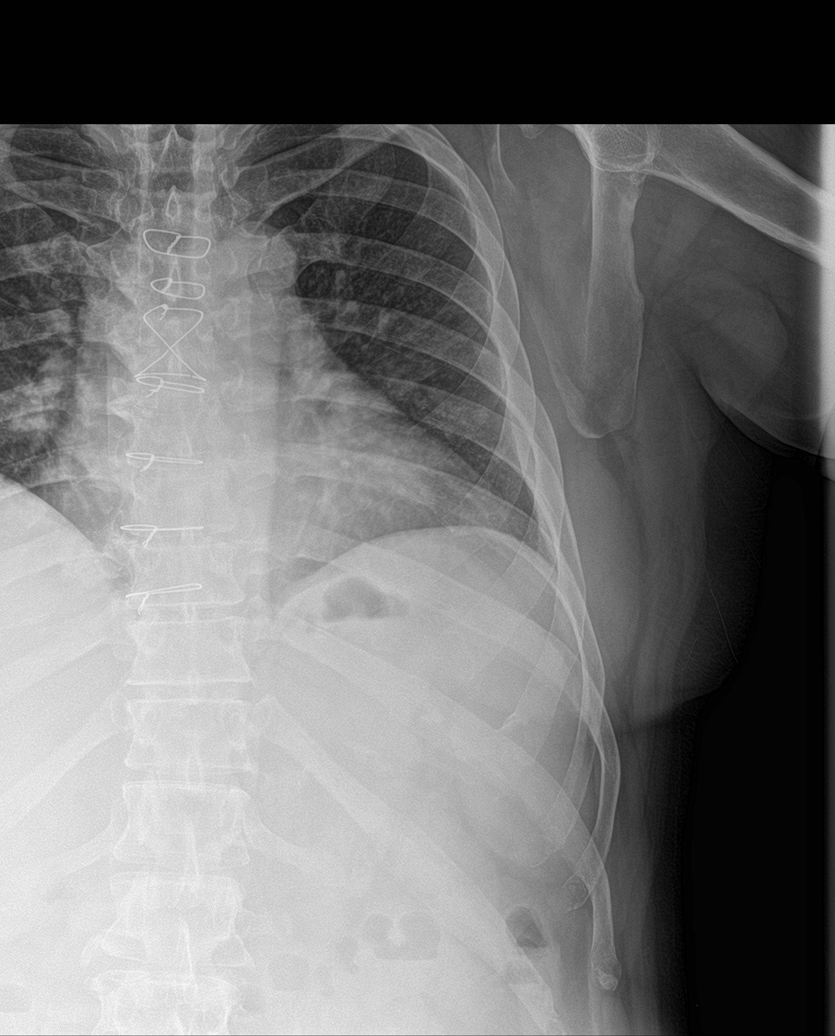
[im 3/5]
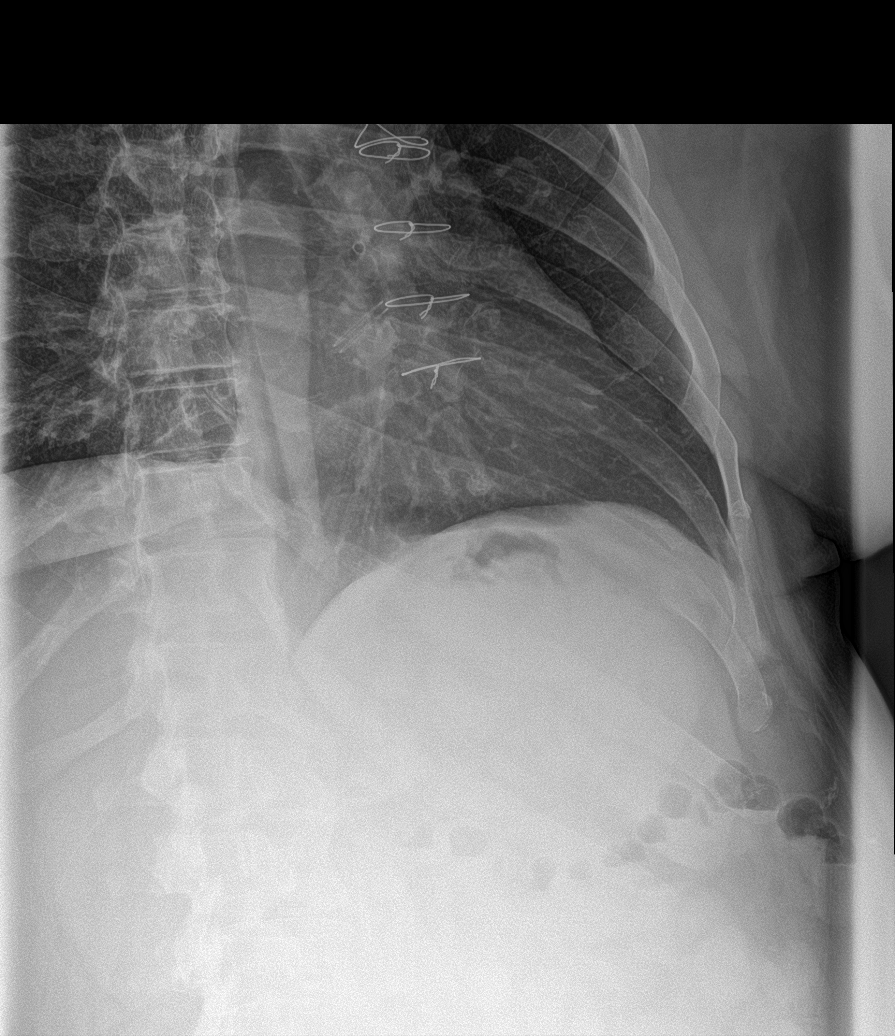
[im 4/5]
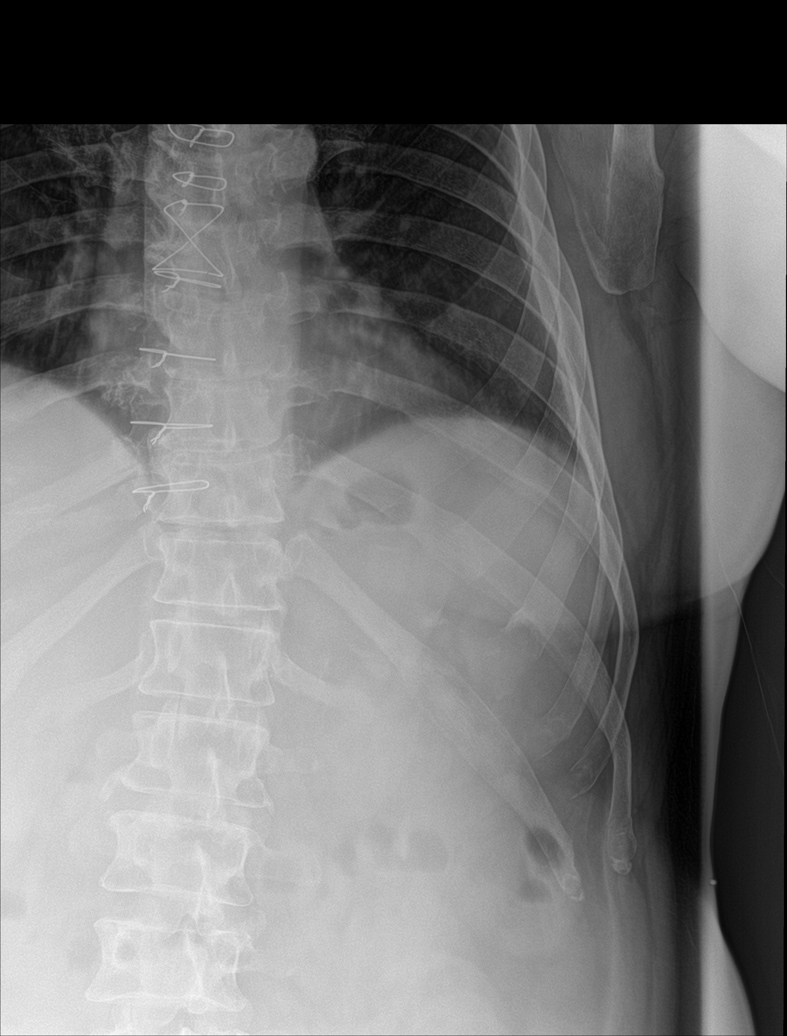
[im 5/5]
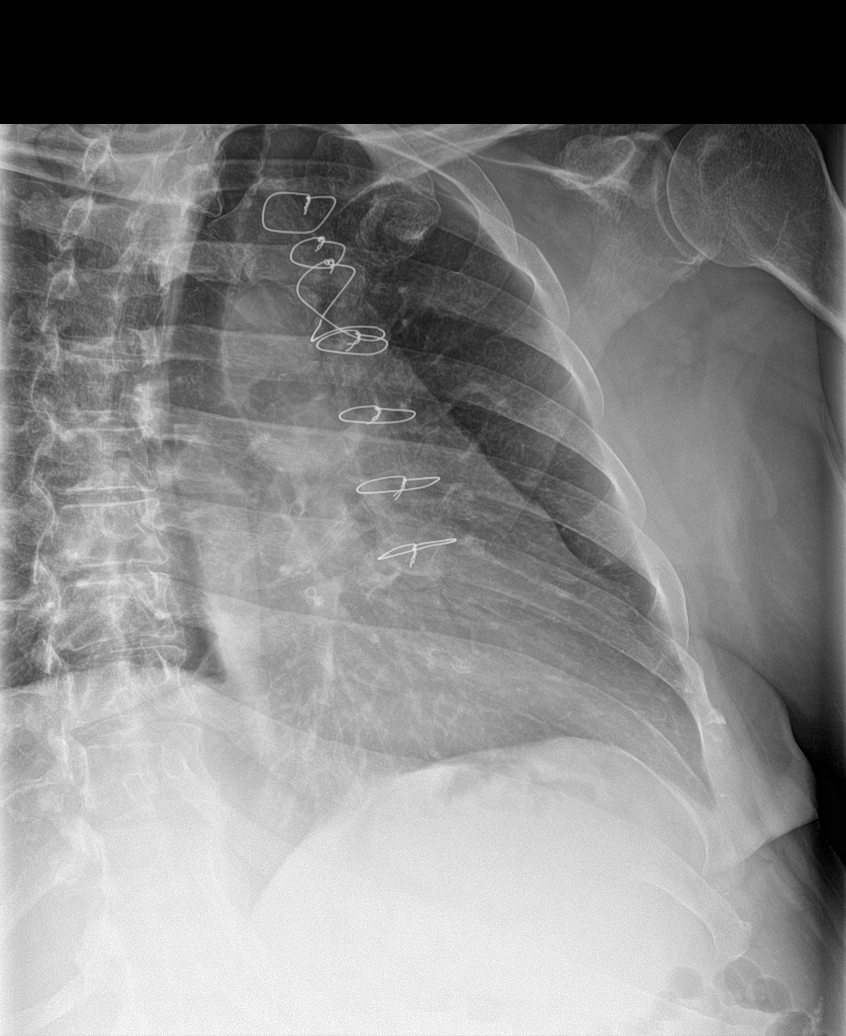

[5 of 5 positions shown; findings below may reference images not displayed]

FINDINGS: Sequelae of prior median sternotomy are again identified. The third
most superior sternal wire is fractured. The cardiomediastinal
silhouette is within normal limits. The lungs are clear. No pleural
effusion or pneumothorax is identified. No rib fracture is
identified.
IMPRESSION: No rib fracture identified.

## 2018-10-16 ENCOUNTER — Other Ambulatory Visit: Payer: Self-pay | Admitting: Family Medicine

## 2018-10-16 ENCOUNTER — Ambulatory Visit: Payer: Self-pay | Admitting: Pharmacist

## 2018-10-16 ENCOUNTER — Telehealth: Payer: Self-pay | Admitting: Family Medicine

## 2018-10-16 DIAGNOSIS — E119 Type 2 diabetes mellitus without complications: Secondary | ICD-10-CM

## 2018-10-16 DIAGNOSIS — Z794 Long term (current) use of insulin: Secondary | ICD-10-CM

## 2018-10-16 MED ORDER — INSULIN LISPRO 100 UNIT/ML ~~LOC~~ SOLN: 50.0000 [IU] | Freq: Two times a day (BID) | SUBCUTANEOUS | 11 refills | Status: DC

## 2018-10-16 NOTE — Telephone Encounter (Signed)
Copied from [place] [id number]. Topic: General - Other >> Oct 16, 2018  8:40 AM Leward Quan A wrote: Reason for CRM: Patient wife called to say that on 10/15/2018 blood sugar was 296 and this morning it is 333 and he can not afford the Insulin Glargine (TOUJEO SOLOSTAR) 300 UNIT/ML SOPN so she would like to know if there are any samples that they can pick up. Please call Ph# [phone] >> Oct 16, 2018  9:51 AM Don Perking M wrote: Pt is already working with CCM to help with this issue.

## 2018-10-16 NOTE — Patient Instructions (Signed)
Visit Information  Goals Addressed            This Visit's Progress     Patient Stated   . "I can't afford my medications" (pt-stated)       Current Barriers:  . Financial Barriers- patient is uninsured and cannot afford brand name medications  . [facility] has requested patient's financial information in order to provide patient assistance but patient has not been able to provide this yet. Patient reports he is working on gathering his proof of income information.  . Patient's diabetes is currently uncontrolled with an A1C of 11.3 (07/12/2018). Patient reports his blood glucose level this morning was 331. He also states that he has been out of his Toujeo and Apidra due to being unable to afford refilling and has only been taking Ozempic for the last couple of months.   Pharmacist Clinical Goal(s):  Marland Kitchen Over the next 14 days, patient will work with PharmD to address needs related to medication access  Interventions: . Contacted [facility] to provide patient with an emergency supply for insulin while patient gathers income information for future patient assistance. Pharmacy is able to supply [place] and Humalog to patient today. Patient does have an active prescription for Tyler Aas but will need a new prescription for Humalog to be sent in to the [facility]. Coordinated this with Dr. Jeananne Rama. . Provided patient with address for [facility] and instructed to pick up emergency supply of insulin. Patient also informed that he will need to submit his financial information in order to continue to receive insulin therapy for future refills.   . Moving forward, will likely want to use concentrated insulins due to the patient's significantly high doses.   Patient Self Care Activities:  . Currently UNABLE TO manage medications or afford medications  Plan: . Patient will go to [facility] to pick up Tresiba and  Humalog today and drop off financial information as soon as possible to receive assistance with future refills.    Please see past updates related to this goal by clicking on the "Past Updates" button in the selected goal         The patient verbalized understanding of instructions provided today and declined a print copy of patient instruction materials.   Plan:  - PharmD will call patient Friday to ensure he obtained his emergency supply of Tresiba and Novolog, and discuss what next steps will be for medication assistance.   Catie Darnelle Maffucci, PharmD Clinical Pharmacist [place] [phone]

## 2018-10-16 NOTE — Telephone Encounter (Signed)
Unfortunately, we do not. I would advise him to continue to work with CCM. Forwarded message to them.

## 2018-10-16 NOTE — Telephone Encounter (Signed)
Working with [facility]. They are filling an emergency supply of Tresiba and Humalog for the patient today while he collects financial information to apply for patient assistance.

## 2018-10-16 NOTE — Chronic Care Management (AMB) (Signed)
  Chronic Care Management   Follow Up Note   10/16/2018 Name: Rodney BRISTOL [name] MRN: [id number] DOB: [birth year]  Referred by: Guadalupe Maple, MD Reason for referral : Chronic Care Management (Medication Management)   Rodney Plumber [name] is a 58 y.o. year old male who is a primary care patient of Crissman, Jeannette How, MD. The CCM team was consulted for assistance with chronic disease management and care coordination needs.    Contacted patient to follow up on obtaining medications from [facility].   Review of patient status, including review of consultants reports, relevant laboratory and other test results, and collaboration with appropriate care team members and the patient's provider was performed as part of comprehensive patient evaluation and provision of chronic care management services.    Goals Addressed            This Visit's Progress     Patient Stated   . "I can't afford my medications" (pt-stated)       Current Barriers:  . Financial Barriers- patient is uninsured and cannot afford brand name medications  . [facility] has requested patient's financial information in order to provide patient assistance but patient has not been able to provide this yet. Patient reports he is working on gathering his proof of income information.  . Patient's diabetes is currently uncontrolled with an A1C of 11.3 (07/12/2018). Patient reports his blood glucose level this morning was 331. He also states that he has been out of his Toujeo and Apidra due to being unable to afford refilling and has only been taking Ozempic for the last couple of months.   Pharmacist Clinical Goal(s):  Marland Kitchen Over the next 14 days, patient will work with PharmD to address needs related to medication access  Interventions: . Contacted [facility] to provide patient with an emergency supply for insulin while patient gathers income information for future patient assistance.  Pharmacy is able to supply [place] and Humalog to patient today. Patient does have an active prescription for Tyler Aas but will need a new prescription for Humalog to be sent in to the [facility]. Coordinated this with Dr. Jeananne Rama. . Provided patient with [facility] for [facility] and instructed to pick up emergency supply of insulin. Patient also informed that he will need to submit his financial information in order to continue to receive insulin therapy for future refills.   . Moving forward, will likely want to use concentrated insulins due to the patient's significantly high doses.   Patient Self Care Activities:  . Currently UNABLE TO manage medications or afford medications  Plan: . Patient will go to [facility] to pick up Tresiba and Humalog today and drop off financial information as soon as possible to receive assistance with future refills.    Please see past updates related to this goal by clicking on the "Past Updates" button in the selected goal          Plan:  - PharmD will call patient Friday to ensure he obtained his emergency supply of Tresiba and Novolog, and discuss what next steps will be for medication assistance.   Catie Darnelle Maffucci, PharmD Clinical Pharmacist [place] [phone]

## 2018-10-19 ENCOUNTER — Ambulatory Visit: Payer: Self-pay | Admitting: Pharmacist

## 2018-10-19 DIAGNOSIS — E119 Type 2 diabetes mellitus without complications: Secondary | ICD-10-CM

## 2018-10-19 DIAGNOSIS — Z794 Long term (current) use of insulin: Secondary | ICD-10-CM

## 2018-10-19 NOTE — Chronic Care Management (AMB) (Signed)
Chronic Care Management   Follow Up Note   10/19/2018 Name: Rodney PRATS [name] MRN: [id number] DOB: 1960-07-25  Referred by: Guadalupe Maple, MD Reason for referral : Chronic Care Management (Medication Managment)   Rodney Plumber [name] is a 58 y.o. year old male who is a primary care patient of Crissman, Jeannette How, MD. The CCM team was consulted for assistance with chronic disease management and care coordination needs.    Contacted patient telephonically today to ensure he was able to pick up emergency supplies of insulin at [facility].   He also endorses some back pain, "over my right kidney". Notes this has been going on for a while. States he isn't sure if it's musculoskeletal ("I may just need a new mattress") or something else. Requests an appointment with or call from Dr. Jeananne Rama to discuss.   Review of patient status, including review of consultants reports, relevant laboratory and other test results, and collaboration with appropriate care team members and the patient's provider was performed as part of comprehensive patient evaluation and provision of chronic care management services.    Goals Addressed            This Visit's Progress     Patient Stated   . "I can't afford my medications" (pt-stated)       Current Barriers:  . Financial Barriers- patient is uninsured and cannot afford brand name medications  . [facility] with [facility] for patient assistance with Ozempic, Farxiga, and insulins. [facility] had Tresiba and Humalog on hand that they were able to dispense to the patient as an emergency supply - he picked up, and dropped off the necessary financial information to [facility] . Reports that he picked up [place] and Humalog on Tuesday. Has been taking Tresiba 25 units daily - fasting sugars remain >250. Was afraid to take 50 units of Humalog, so took 25. Reports blood sugar dropped from 241 to 89 after lunch, so he has  adjusted and is taking 10-15 units of meal time insulin  Pharmacist Clinical Goal(s):  Marland Kitchen Over the next 14 days, patient will work with PharmD to address needs related to medication access . Over the next 90 days, patient with work with PharmD and primary care provider for optimized medication management of diabetes  Interventions: . Counseled patient on management of hypoglycemia.  . Will likely need to increase Tresiba from 25 units, given elevated fastings. Recommend increasing to 30 units and reevaluating next week; will collaborate with Dr. Jeananne Rama on this. Ideally, would like to submit Patient Assistance application for a higher maximal dose to allow for necessary titration. Will collaborate with PharmD at [facility] on this.  . Encouraged patient to continue lower mealtime insulin doses to reduce risk of hypoglycemia.  . Educated patient that insulin adjustment will likely be needed once [place] and Ozempic are obtained from patient assistance and restarted. Asked to continue to check fasting and 1-2 post prandial blood sugars daily to help direct titration; he verbalized understanding.   Patient Self Care Activities:  . Currently UNABLE TO manage medications or afford medications   Please see past updates related to this goal by clicking on the "Past Updates" button in the selected goal          Plan: - PharmD will coordinate with [facility] regarding submitting patient assistance applications.  - PharmD will outreach patient next week for further review of blood sugars  Catie Darnelle Maffucci, PharmD Clinical  Pharmacist [place] [phone]

## 2018-10-19 NOTE — Patient Instructions (Signed)
Visit Information  Goals Addressed            This Visit's Progress     Patient Stated   . "I can't afford my medications" (pt-stated)       Current Barriers:  . Financial Barriers- patient is uninsured and cannot afford brand name medications  . [facility] with [facility] for patient assistance with Ozempic, Farxiga, and insulins. [facility] had Tresiba and Humalog on hand that they were able to dispense to the patient as an emergency supply - he picked up, and dropped off the necessary financial information to [facility] . Reports that he picked up [place] and Humalog on Tuesday. Has been taking Tresiba 25 units daily - fasting sugars remain >250. Was afraid to take 50 units of Humalog, so took 25. Reports blood sugar dropped from 241 to 89 after lunch, so he has adjusted and is taking 10-15 units of meal time insulin  Pharmacist Clinical Goal(s):  Marland Kitchen Over the next 14 days, patient will work with PharmD to address needs related to medication access . Over the next 90 days, patient with work with PharmD and primary care provider for optimized medication management of diabetes  Interventions: . Counseled patient on management of hypoglycemia.  . Will likely need to increase Tresiba from 25 units, given elevated fastings. Recommend increasing to 30 units and reevaluating next week; will collaborate with Dr. Jeananne Rama on this. Ideally, would like to submit Patient Assistance application for a higher maximal dose to allow for necessary titration. Will collaborate with PharmD at [facility] on this.  . Encouraged patient to continue lower mealtime insulin doses to reduce risk of hypoglycemia.  . Educated patient that insulin adjustment will likely be needed once [place] and Ozempic are obtained from patient assistance and restarted. Asked to continue to check fasting and 1-2 post prandial blood sugars daily to help direct titration; he verbalized  understanding.   Patient Self Care Activities:  . Currently UNABLE TO manage medications or afford medications   Please see past updates related to this goal by clicking on the "Past Updates" button in the selected goal         The patient verbalized understanding of instructions provided today and declined a print copy of patient instruction materials.   Plan: - PharmD will coordinate with [facility] regarding submitting patient assistance applications.  - PharmD will outreach patient next week for further review of blood sugars  Catie Darnelle Maffucci, PharmD Clinical Pharmacist [place] [phone]

## 2018-10-20 ENCOUNTER — Telehealth: Payer: Self-pay | Admitting: Family Medicine

## 2018-10-20 DIAGNOSIS — E119 Type 2 diabetes mellitus without complications: Secondary | ICD-10-CM

## 2018-10-20 NOTE — Telephone Encounter (Signed)
Will check BMP due to flank pain

## 2018-10-22 ENCOUNTER — Other Ambulatory Visit: Payer: Self-pay

## 2018-10-22 DIAGNOSIS — E119 Type 2 diabetes mellitus without complications: Secondary | ICD-10-CM

## 2018-10-23 LAB — BASIC METABOLIC PANEL
BUN/Creatinine Ratio: 18 (ref 9–20)
BUN: 17 mg/dL (ref 6–24)
CO2: 26 mmol/L (ref 20–29)
Calcium: 10 mg/dL (ref 8.7–10.2)
Chloride: 98 mmol/L (ref 96–106)
Creatinine, Ser: 0.96 mg/dL (ref 0.76–1.27)
GFR calc Af Amer: 100 mL/min/{1.73_m2} (ref >59)
GFR calc non Af Amer: 87 mL/min/{1.73_m2} (ref >59)
Glucose: 135 mg/dL — ABNORMAL HIGH (ref 65–99)
Potassium: 3.5 mmol/L (ref 3.5–5.2)
Sodium: 139 mmol/L (ref 134–144)

## 2018-11-19 ENCOUNTER — Other Ambulatory Visit: Payer: Self-pay

## 2018-11-19 ENCOUNTER — Ambulatory Visit: Payer: Self-pay | Admitting: Pharmacy Technician

## 2018-11-19 DIAGNOSIS — Z79899 Other long term (current) drug therapy: Secondary | ICD-10-CM

## 2018-11-19 NOTE — Progress Notes (Addendum)
Patient verbally agreed to all terms of the [facility] contract.    Patient approved to receive medication assistance at [facility] as long as eligibility criteria continues to be met.    Provided patient with community resource material based on his particular needs.    Referred patient for MTM.  Referred patient to [facility].  Patient stated that he preferred to continue seeing Dr. Mark [facility] at [facility].    Provided patient with contact information about applying for charity care for [facility].  Betty J. Kluttz Care Manager [facility]  

## 2018-11-21 ENCOUNTER — Other Ambulatory Visit: Payer: Self-pay

## 2018-11-21 ENCOUNTER — Other Ambulatory Visit: Payer: Self-pay | Admitting: Family Medicine

## 2018-11-21 ENCOUNTER — Encounter: Payer: Self-pay | Admitting: Nurse Practitioner

## 2018-11-21 ENCOUNTER — Ambulatory Visit (INDEPENDENT_AMBULATORY_CARE_PROVIDER_SITE_OTHER): Payer: Self-pay | Admitting: Nurse Practitioner

## 2018-11-21 VITALS — BP 152/96 | HR 86 | Temp 98.3°F | Ht 68.0 in | Wt 255.0 lb

## 2018-11-21 DIAGNOSIS — E119 Type 2 diabetes mellitus without complications: Secondary | ICD-10-CM

## 2018-11-21 DIAGNOSIS — Z954 Presence of other heart-valve replacement: Secondary | ICD-10-CM

## 2018-11-21 DIAGNOSIS — R21 Rash and other nonspecific skin eruption: Secondary | ICD-10-CM | POA: Insufficient documentation

## 2018-11-21 LAB — COAGUCHEK XS/INR WAIVED
INR: 1.8 — ABNORMAL HIGH (ref 0.9–1.1)
Prothrombin Time: 21.8 s

## 2018-11-21 MED ORDER — WARFARIN SODIUM 10 MG PO TABS: 10.0000 mg | ORAL_TABLET | Freq: Every day | ORAL | 1 refills | Status: DC

## 2018-11-21 MED ORDER — DAPAGLIFLOZIN PROPANEDIOL 10 MG PO TABS: 10.0000 mg | ORAL_TABLET | Freq: Every day | ORAL | 3 refills | Status: DC

## 2018-11-21 MED ORDER — FENOFIBRATE 145 MG PO TABS: 145.0000 mg | ORAL_TABLET | Freq: Every day | ORAL | 3 refills | Status: DC

## 2018-11-21 MED ORDER — CLOBETASOL PROPIONATE 0.05 % EX CREA: 1.0000 "application " | TOPICAL_CREAM | Freq: Two times a day (BID) | CUTANEOUS | 0 refills | Status: DC

## 2018-11-21 NOTE — Assessment & Plan Note (Signed)
On Coumadin, INR today 1.8 (below goal).  Will increase to 10 MG daily and have return in one week for labs and visit.  Recommend monitoring greens intake.

## 2018-11-21 NOTE — Assessment & Plan Note (Signed)
To right arm and small amount left arm.  Suspect contact dermatitis.  Script for Clobetasol 0.05% cream sent, will avoid Prednisone due to uncontrolled diabetes.  Recommend use of Allegra or Claritin daily and avoiding contact with other areas + washing clothes and sheets daily.  Will reassess next week at appointment.  Return sooner for worsening.

## 2018-11-21 NOTE — Patient Instructions (Signed)
Bleeding Precautions When on Anticoagulant Therapy, Adult °Anticoagulant therapy, also called blood thinner therapy, is medicine that helps to prevent and treat blood clots. The medicine works by stopping blood clots from forming or growing. Blood clots that form in your blood vessels can be dangerous. They can break loose and travel to the heart, lungs, or brain. This increases the risk of a heart attack, stroke, or blocked lung artery (pulmonary embolism). °Anticoagulants also increase the risk of bleeding. Try to protect yourself from cuts and other injuries that can cause bleeding. It is important to take anticoagulants exactly as told by your health care provider. °Why do I need to be on anticoagulant therapy? °You may need this medicine if you are at risk of developing a blood clot. Conditions that increase your risk of a blood clot include: °· Being born with heart disease or a heart malformation (congenital heart disease). °· Developing heart disease. °· Having had surgery, such as valve replacement. °· Having had a serious accident or other type of severe injury (trauma). °· Having certain types of cancer. °· Having certain diseases that can increase blood clotting. °· Having a high risk of stroke or heart attack. °· Having atrial fibrillation (AF). °What are the common anticoagulant medicines? °There are several types of anticoagulant medicines. The most common types are: °· Medicines that you take by mouth (oral medicines), such as: °? Warfarin. °? Novel oral anticoagulants (NOACs), such as: °? Direct thrombin inhibitors (dabigatran). °? Factor Xa inhibitors (apixaban, edoxaban, and rivaroxaban). °· Injections, such as: °? Unfractionated heparin. °? Low molecular weight heparin. °These anticoagulants work in different ways to prevent blood clots. They also have different risks and side effects. °What do I need to remember while on anticoagulant therapy? °Taking anticoagulants °· Take your medicine at the  same time every day. If you forget to take your medicine, take it as soon as you remember. Do not double your dosage of medicine if you miss a whole day. Take your normal dose and call your health care provider. °· Do not stop taking your medicine unless your health care provider approves. Stopping the medicine can increase your risk of developing a blood clot. °Taking other medicines °· Take over-the-counter and prescriptions medicines only as told by your health care provider. °· Do not take over-the-counter NSAIDs, including aspirin and ibuprofen, while you are on anticoagulant therapy. These medicines increase your risk of dangerous bleeding. °· Get approval from your health care provider before you start taking any new medicines, vitamins, or herbal products. Some of these could interfere with your therapy. °General instructions °· Keep all follow-up visits as told by your health care provider. This is important. °· If you are pregnant or trying to get pregnant, talk with a health care provider about anticoagulants. Some of these medicines are not safe to take during pregnancy. °· Tell all health care providers, including your dentist, that you are on anticoagulant therapy. It is especially important to tell providers before you have any surgery, medical procedures, or dental work done. °What precautions should I take? ° °· Be very careful when using knives, scissors, or other sharp objects. °· Use an electric razor instead of a blade. °· Do not use toothpicks. °· Use a soft-bristled toothbrush. Brush your teeth gently. °· Always wear shoes outdoors and wear slippers indoors. °· Be careful when cutting your fingernails and toenails. °· Place bath mats in the bathroom. If possible, install handrails as well. °· Wear gloves while you do   yard work. °· Wear your seat belt. °· Prevent falls by removing loose rugs and extension cords from areas where you walk. Use a cane or walker if you need it. °· Avoid  constipation by: °? Drinking enough fluid to keep your urine clear or pale yellow. °? Eating foods that are high in fiber, such as fresh fruits and vegetables, whole grains, and beans. °? Limiting foods that are high in fat and processed sugars, such as fried and sweet foods. °· Do not play contact sports or participate in other activities that have a high risk for injury. °What other precautions are important if on warfarin therapy? °If you are taking a type of anticoagulant called warfarin, make sure you: °· Work with a diet and nutrition specialist (dietitian) to make an eating plan. Do not make any sudden changes to your diet after you have started your eating plan. °· Do not drink alcohol. It can interfere with your medicine and increase your risk of an injury that causes bleeding. °· Get regular blood tests as told by your health care provider. °What are some questions to ask my health care provider? °· Why do I need anticoagulant therapy? °· What is the best anticoagulant therapy for my condition? °· How long will I need anticoagulant therapy? °· What are the side effects of anticoagulant therapy? °· When should I take my medicine? What should I do if I forget to take it? °· Will I need to have regular blood tests? °· Do I need to change my diet? Are there foods or drinks that I should avoid? °· What activities are safe for me? °· What should I do if I want to get pregnant? °Contact a health care provider if: °· You miss a dose of medicine: °? And you are not sure what to do. °? For more than one day. °· You have: °? Menstrual bleeding that is heavier than normal. °? Bloody or brown urine. °? Easy bruising. °? Black and tarry stool or bright red stool. °? Side effects from your medicine. °· You feel weak or dizzy. °· You become pregnant. °Get help right away if: °· You have bleeding that will not stop within 20 minutes from: °? The nose. °? The gums. °? A cut on the skin. °· You have a severe headache or  stomachache. °· You vomit or cough up blood. °· You fall or hit your head. °Summary °· Anticoagulant therapy, also called blood thinner therapy, is medicine that helps to prevent and treat blood clots. °· Anticoagulants work in different ways to prevent blood clots. They also have different risks and side effects. °· Talk with your health care provider about any precautions that you should take while on anticoagulant therapy. °This information is not intended to replace advice given to you by your health care provider. Make sure you discuss any questions you have with your health care provider. °Document Released: 04/06/2015 Document Revised: 08/15/2018 Document Reviewed: 07/12/2016 °Elsevier Patient Education © 2020 [employer] ° °

## 2018-11-21 NOTE — Progress Notes (Signed)
BP (!) 152/96   Pulse 86   Temp 98.3 F (36.8 C) (Oral)   Ht 5\' 8"  (1.727 m)   Wt 255 lb (115.7 kg)   SpO2 92%   BMI 38.77 kg/m    Subjective:    Patient ID: Rodney CharyPaul T Abdou [name], male    DOB: 1960/08/11, 58 y.o.   MRN: [id number]  HPI: Rodney Biaul T Argabright [name] is a 58 y.o. male  Chief Complaint  Patient presents with  . Rash    right arm  x 4-5 days. itchy. tried OTC lotion  . Coagulation Disorder   RASH Started 4-5 days, had been out cutting brush day before.  Never has had a reaction to poison ivy or oak before.  Started on dorsal aspect of wrist and itchy, when scratched area it spread up right arm.   Duration:  days  Location: arms  Itching: yes Burning: no Redness: yes Oozing: no Scaling: yes Blisters: no Painful: no Fevers: no Change in detergents/soaps/personal care products: no Recent illness: no Recent travel:no History of same: no Context: fluctuating Alleviating factors: calamine Treatments attempted:calamine Shortness of breath: no  Throat/tongue swelling: no Myalgias/arthralgias: no   ATRIAL FIBRILLATION Last INR was 2 and Coumadin dose was maintained the same 10/04/18.  Currently taking alternating schedule 9, 10, 9, 10.  Not eating greens often at home.  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  Level for mechanical valve 2.5-3.5.   Denies any recent bleeding episodes, blood in stool, or excess bruising. Atrial fibrillation status: stable Satisfied with current treatment: yes  Medication side effects:  no Medication compliance: good compliance Etiology of atrial fibrillation:  Palpitations:  no Chest pain:  no Dyspnea on exertion:  no Orthopnea:  no Syncope:  no Edema:  no Ventricular rate control: B-blocker Anti-coagulation: warfarin   Relevant past medical, surgical, family and social history reviewed and updated as indicated. Interim medical history since our last visit reviewed. Allergies and medications reviewed and  updated.  Review of Systems  Constitutional: Negative for activity change, diaphoresis, fatigue and fever.  Respiratory: Negative for cough, chest tightness, shortness of breath and wheezing.   Cardiovascular: Negative for chest pain, palpitations and leg swelling.  Gastrointestinal: Negative for abdominal distention, abdominal pain, constipation, diarrhea, nausea and vomiting.  Musculoskeletal: Negative.   Skin: Positive for rash.  Neurological: Negative for dizziness, syncope, weakness, light-headedness, numbness and headaches.  Psychiatric/Behavioral: Negative.     Per HPI unless specifically indicated above     Objective:    BP (!) 152/96   Pulse 86   Temp 98.3 F (36.8 C) (Oral)   Ht 5\' 8"  (1.727 m)   Wt 255 lb (115.7 kg)   SpO2 92%   BMI 38.77 kg/m   Wt Readings from Last 3 Encounters:  11/21/18 255 lb (115.7 kg)  07/19/18 249 lb 8 oz (113.2 kg)  07/12/18 250 lb 2 oz (113.5 kg)    Physical Exam Vitals signs and nursing note reviewed.  Constitutional:      General: He is awake. He is not in acute distress.    Appearance: He is well-developed. He is morbidly obese. He is not ill-appearing.  HENT:     Head: Normocephalic and atraumatic.     Right Ear: Hearing normal. No drainage.     Left Ear: Hearing normal. No drainage.     Mouth/Throat:     Pharynx: Uvula midline.  Eyes:     General: Lids are normal.  Right eye: No discharge.        Left eye: No discharge.     Conjunctiva/sclera: Conjunctivae normal.     Pupils: Pupils are equal, round, and reactive to light.  Neck:     Musculoskeletal: Normal range of motion and neck supple.     Thyroid: No thyromegaly.     Vascular: No carotid bruit or JVD.     Trachea: Trachea normal.  Cardiovascular:     Rate and Rhythm: Normal rate and regular rhythm.     Heart sounds: Normal heart sounds, S1 normal and S2 normal. No murmur. No gallop.   Pulmonary:     Effort: Pulmonary effort is normal. No accessory muscle  usage or respiratory distress.     Breath sounds: Normal breath sounds.  Abdominal:     General: Bowel sounds are normal.     Palpations: Abdomen is soft. There is no hepatomegaly or splenomegaly.  Musculoskeletal: Normal range of motion.     Right lower leg: No edema.     Left lower leg: No edema.  Skin:    General: Skin is warm and dry.     Capillary Refill: Capillary refill takes less than 2 seconds.     Findings: No rash.     Comments: Rash to dorsal aspect of right wrist/lower hand with linear pattern and scratch marks + patch of white scaling.  Extension of rash along upper right arm with small areas of crusting present.  X 3 small circular areas of erythema to dorsale aspect of left hand and arm with crusting. No drainage present or blisters/vesicles.  Neurological:     Mental Status: He is alert and oriented to person, place, and time.     Deep Tendon Reflexes: Reflexes are normal and symmetric.  Psychiatric:        Mood and Affect: Mood normal.        Behavior: Behavior normal. Behavior is cooperative.        Thought Content: Thought content normal.        Judgment: Judgment normal.     Results for orders placed or performed in visit on 10/22/18  Basic metabolic panel  Result Value Ref Range   Glucose 135 (H) 65 - 99 mg/dL   BUN 17 6 - 24 mg/dL   Creatinine, Ser 4.090.96 0.76 - 1.27 mg/dL   GFR calc non Af Amer 87 >59 mL/min/1.73   GFR calc Af Amer 100 >59 mL/min/1.73   BUN/Creatinine Ratio 18 9 - 20   Sodium 139 134 - 144 mmol/L   Potassium 3.5 3.5 - 5.2 mmol/L   Chloride 98 96 - 106 mmol/L   CO2 26 20 - 29 mmol/L   Calcium 10.0 8.7 - 10.2 mg/dL      Assessment & Plan:   Problem List Items Addressed This Visit      Musculoskeletal and Integument   Rash - Primary    To right arm and small amount left arm.  Suspect contact dermatitis.  Script for Clobetasol 0.05% cream sent, will avoid Prednisone due to uncontrolled diabetes.  Recommend use of Allegra or Claritin  daily and avoiding contact with other areas + washing clothes and sheets daily.  Will reassess next week at appointment.  Return sooner for worsening.        Other   Presence of other heart-valve replacement    On Coumadin, INR today 1.8 (below goal).  Will increase to 10 MG daily and have return in one week for  labs and visit.  Recommend monitoring greens intake.        Relevant Orders   CoaguChek XS/INR Waived   CoaguChek XS/INR Waived    Other Visit Diagnoses    Diabetes mellitus without complication ([place])       Relevant Medications   dapagliflozin propanediol (FARXIGA) 10 MG TABS tablet       Follow up plan: Return in about 1 week (around 11/28/2018) for INR check (may do virtual with Dr. Loletha Grayer and get labs first).

## 2018-11-29 ENCOUNTER — Other Ambulatory Visit: Payer: Self-pay

## 2018-11-30 ENCOUNTER — Encounter: Payer: Self-pay | Admitting: Nurse Practitioner

## 2018-11-30 ENCOUNTER — Ambulatory Visit (INDEPENDENT_AMBULATORY_CARE_PROVIDER_SITE_OTHER): Payer: Self-pay | Admitting: Nurse Practitioner

## 2018-11-30 ENCOUNTER — Other Ambulatory Visit: Payer: Self-pay

## 2018-11-30 DIAGNOSIS — Z954 Presence of other heart-valve replacement: Secondary | ICD-10-CM

## 2018-11-30 DIAGNOSIS — E119 Type 2 diabetes mellitus without complications: Secondary | ICD-10-CM

## 2018-11-30 LAB — COAGUCHEK XS/INR WAIVED
INR: 2.9 — ABNORMAL HIGH (ref 0.9–1.1)
Prothrombin Time: 34.7 s

## 2018-11-30 MED ORDER — GABAPENTIN 300 MG PO CAPS: 600.0000 mg | ORAL_CAPSULE | Freq: Two times a day (BID) | ORAL | 2 refills | Status: DC

## 2018-11-30 MED ORDER — DAPAGLIFLOZIN PROPANEDIOL 10 MG PO TABS: 10.0000 mg | ORAL_TABLET | Freq: Every day | ORAL | 3 refills | Status: DC

## 2018-11-30 NOTE — Patient Instructions (Signed)
Bleeding Precautions When on Anticoagulant Therapy, Adult °Anticoagulant therapy, also called blood thinner therapy, is medicine that helps to prevent and treat blood clots. The medicine works by stopping blood clots from forming or growing. Blood clots that form in your blood vessels can be dangerous. They can break loose and travel to the heart, lungs, or brain. This increases the risk of a heart attack, stroke, or blocked lung artery (pulmonary embolism). °Anticoagulants also increase the risk of bleeding. Try to protect yourself from cuts and other injuries that can cause bleeding. It is important to take anticoagulants exactly as told by your health care provider. °Why do I need to be on anticoagulant therapy? °You may need this medicine if you are at risk of developing a blood clot. Conditions that increase your risk of a blood clot include: °· Being born with heart disease or a heart malformation (congenital heart disease). °· Developing heart disease. °· Having had surgery, such as valve replacement. °· Having had a serious accident or other type of severe injury (trauma). °· Having certain types of cancer. °· Having certain diseases that can increase blood clotting. °· Having a high risk of stroke or heart attack. °· Having atrial fibrillation (AF). °What are the common anticoagulant medicines? °There are several types of anticoagulant medicines. The most common types are: °· Medicines that you take by mouth (oral medicines), such as: °? Warfarin. °? Novel oral anticoagulants (NOACs), such as: °? Direct thrombin inhibitors (dabigatran). °? Factor Xa inhibitors (apixaban, edoxaban, and rivaroxaban). °· Injections, such as: °? Unfractionated heparin. °? Low molecular weight heparin. °These anticoagulants work in different ways to prevent blood clots. They also have different risks and side effects. °What do I need to remember while on anticoagulant therapy? °Taking anticoagulants °· Take your medicine at the  same time every day. If you forget to take your medicine, take it as soon as you remember. Do not double your dosage of medicine if you miss a whole day. Take your normal dose and call your health care provider. °· Do not stop taking your medicine unless your health care provider approves. Stopping the medicine can increase your risk of developing a blood clot. °Taking other medicines °· Take over-the-counter and prescriptions medicines only as told by your health care provider. °· Do not take over-the-counter NSAIDs, including aspirin and ibuprofen, while you are on anticoagulant therapy. These medicines increase your risk of dangerous bleeding. °· Get approval from your health care provider before you start taking any new medicines, vitamins, or herbal products. Some of these could interfere with your therapy. °General instructions °· Keep all follow-up visits as told by your health care provider. This is important. °· If you are pregnant or trying to get pregnant, talk with a health care provider about anticoagulants. Some of these medicines are not safe to take during pregnancy. °· Tell all health care providers, including your dentist, that you are on anticoagulant therapy. It is especially important to tell providers before you have any surgery, medical procedures, or dental work done. °What precautions should I take? ° °· Be very careful when using knives, scissors, or other sharp objects. °· Use an electric razor instead of a blade. °· Do not use toothpicks. °· Use a soft-bristled toothbrush. Brush your teeth gently. °· Always wear shoes outdoors and wear slippers indoors. °· Be careful when cutting your fingernails and toenails. °· Place bath mats in the bathroom. If possible, install handrails as well. °· Wear gloves while you do   yard work. °· Wear your seat belt. °· Prevent falls by removing loose rugs and extension cords from areas where you walk. Use a cane or walker if you need it. °· Avoid  constipation by: °? Drinking enough fluid to keep your urine clear or pale yellow. °? Eating foods that are high in fiber, such as fresh fruits and vegetables, whole grains, and beans. °? Limiting foods that are high in fat and processed sugars, such as fried and sweet foods. °· Do not play contact sports or participate in other activities that have a high risk for injury. °What other precautions are important if on warfarin therapy? °If you are taking a type of anticoagulant called warfarin, make sure you: °· Work with a diet and nutrition specialist (dietitian) to make an eating plan. Do not make any sudden changes to your diet after you have started your eating plan. °· Do not drink alcohol. It can interfere with your medicine and increase your risk of an injury that causes bleeding. °· Get regular blood tests as told by your health care provider. °What are some questions to ask my health care provider? °· Why do I need anticoagulant therapy? °· What is the best anticoagulant therapy for my condition? °· How long will I need anticoagulant therapy? °· What are the side effects of anticoagulant therapy? °· When should I take my medicine? What should I do if I forget to take it? °· Will I need to have regular blood tests? °· Do I need to change my diet? Are there foods or drinks that I should avoid? °· What activities are safe for me? °· What should I do if I want to get pregnant? °Contact a health care provider if: °· You miss a dose of medicine: °? And you are not sure what to do. °? For more than one day. °· You have: °? Menstrual bleeding that is heavier than normal. °? Bloody or brown urine. °? Easy bruising. °? Black and tarry stool or bright red stool. °? Side effects from your medicine. °· You feel weak or dizzy. °· You become pregnant. °Get help right away if: °· You have bleeding that will not stop within 20 minutes from: °? The nose. °? The gums. °? A cut on the skin. °· You have a severe headache or  stomachache. °· You vomit or cough up blood. °· You fall or hit your head. °Summary °· Anticoagulant therapy, also called blood thinner therapy, is medicine that helps to prevent and treat blood clots. °· Anticoagulants work in different ways to prevent blood clots. They also have different risks and side effects. °· Talk with your health care provider about any precautions that you should take while on anticoagulant therapy. °This information is not intended to replace advice given to you by your health care provider. Make sure you discuss any questions you have with your health care provider. °Document Released: 04/06/2015 Document Revised: 08/15/2018 Document Reviewed: 07/12/2016 °Elsevier Patient Education © 2020 [employer] ° °

## 2018-11-30 NOTE — Progress Notes (Signed)
BP (!) 148/87 (BP Location: Left Arm, Patient Position: Sitting, Cuff Size: Normal)    Pulse 88    Temp 98.2 F (36.8 C) (Oral)    Ht 5\' 8"  (1.727 m)    Wt 250 lb (113.4 kg)    SpO2 94%    BMI 38.01 kg/m    Subjective:    Patient ID: Rodney CharyPaul T Wernli [name], male    DOB: 1960-07-18, 58 y.o.   MRN: [id number]     This visit was completed via telephone due to the restrictions of the COVID-19 pandemic. All issues as above were discussed and addressed but no physical exam was performed. If it was felt that the patient should be evaluated in the office, they were directed there. The patient verbally consented to this visit. Patient was unable to complete an audio/visual visit due to Technical difficulties,Lack of internet. Due to the catastrophic nature of the COVID-19 pandemic, this visit was done through audio contact only.  Location of the patient: home  Location of the provider: home  Those involved with this call:   Provider: Aura DialsJolene Antara Brecheisen, DNP  CMA: Wilhemena DurieBrittany Russell, CMA  Front Desk/Registration: Harriet PhoJoliza Johnson   Time spent on call: 15 minutes on the phone discussing health concerns. 5 minutes total spent in review of patient's record and preparation of their chart.   I verified patient identity using two factors (patient name and date of birth). Patient consents verbally to being seen via telemedicine visit today.   CC: Coumadin management  HPI: This patient is a 58 y.o. male who presents for coumadin management. The expected duration of coumadin treatment is lifelong The reason for anticoagulation is  mechanical heart valve.  INR last visit 1.8 and Coumadin increased to 10 MG daily.  Current INR at goal 2.9.  He denies any ADR with increased dose.  Present Coumadin dose: 10 MG daily Goal: 2.5-3.5 The patient does not have an active anticoagulation episode. Excessive bruising: no Nose bleeding: no Rectal bleeding: no Prolonged menstrual cycles: N/A Eating diet with consistent  amounts of foods containing Vitamin K:yes Any recent antibiotic use? no  Relevant past medical, surgical, family and social history reviewed and updated as indicated. Interim medical history since our last visit reviewed. Allergies and medications reviewed and updated.  ROS: Per HPI unless specifically indicated above     Objective:    BP (!) 148/87 (BP Location: Left Arm, Patient Position: Sitting, Cuff Size: Normal)    Pulse 88    Temp 98.2 F (36.8 C) (Oral)    Ht 5\' 8"  (1.727 m)    Wt 250 lb (113.4 kg)    SpO2 94%    BMI 38.01 kg/m   Wt Readings from Last 3 Encounters:  11/30/18 250 lb (113.4 kg)  11/21/18 255 lb (115.7 kg)  07/19/18 249 lb 8 oz (113.2 kg)     General: Unable to perform physical exam due to telephone visit only  Last INR: 1.8    Last CBC:  Lab Results  Component Value Date   WBC 7.5 03/05/2018   HGB 14.1 03/05/2018   HCT 42.6 03/05/2018   MCV 83.4 03/05/2018   PLT 253 03/05/2018    Results for orders placed or performed in visit on 11/21/18  CoaguChek XS/INR Waived  Result Value Ref Range   INR 1.8 (H) 0.9 - 1.1   Prothrombin Time 21.8 sec       Assessment:     ICD-10-CM   1. Presence of other heart-valve  replacement  Z95.4 CoaguChek XS/INR Waived  2. Diabetes mellitus without complication (HCC)  Q00.8 dapagliflozin propanediol (FARXIGA) 10 MG TABS tablet    Plan:   Discussed current plan face-to-face with patient. For coumadin dosing, elected to continue current dose. Will plan to recheck INR in 1 month.  I discussed the assessment and treatment plan with the patient. The patient was provided an opportunity to ask questions and all were answered. The patient agreed with the plan and demonstrated an understanding of the instructions.   The patient was advised to call back or seek an in-person evaluation if the symptoms worsen or if the condition fails to improve as anticipated.   I provided 15 minutes of time during this encounter.

## 2018-11-30 NOTE — Assessment & Plan Note (Signed)
On Coumadin, INR today 2.9.  Continue current Coumadin dose, 10 MG daily, and return in 4 weeks for INR check + chronic disease visit.

## 2018-12-03 ENCOUNTER — Encounter: Payer: Self-pay | Admitting: Pharmacist

## 2018-12-03 ENCOUNTER — Ambulatory Visit: Payer: Self-pay | Admitting: Pharmacist

## 2018-12-03 DIAGNOSIS — Z79899 Other long term (current) drug therapy: Secondary | ICD-10-CM

## 2018-12-03 NOTE — Progress Notes (Signed)
[facility] Phone Visit Note  Patient: Rodney BARTOLI [name] MRN: [id number] Date of Birth: March 09, 1961 PCP: Guadalupe Maple, MD   [place]. 58 y.o. male was contacted for a medication therapy management visit today. Two identifiers were used to verify patient identity over the phone.   There were no vitals taken for this visit.  Patient Information   Past Medical History:  Diagnosis Date  . Allergy   . Diabetes mellitus without complication ([place])   . Hyperlipidemia   . Hypertension       Past Surgical History:  Procedure Laterality Date  . CARDIAC VALVE REPLACEMENT       Family History  Problem Relation Age of Onset  . Diabetes Mother   . Heart disease Mother   . Hyperlipidemia Mother   . Hypertension Mother     New Diagnoses (since last visit): N/A  Family Support: Good  Lifestyle Diet: Breakfast: low carb item from [place]: tomato sandwich or hamburger Dinner: spaghetti or meat Drinks: Pepsi zero, water, or coffee Exercise: pt reports no regular exercise but is on feet all day at work     Social History   Substance and Sexual Activity  Alcohol Use No      Social History   Tobacco Use  Smoking Status Never Smoker  Smokeless Tobacco Never Used      Health Maintenance  Topic Date Due  . OPHTHALMOLOGY EXAM  09/18/2015  . FOOT EXAM  12/15/2016  . COLONOSCOPY  03/09/2019 (Originally 04/06/2010)  . INFLUENZA VACCINE  12/08/2018  . HEMOGLOBIN A1C  01/12/2019  . TETANUS/TDAP  08/28/2023  . PNEUMOCOCCAL POLYSACCHARIDE VACCINE AGE 69-64 HIGH RISK  Completed  . Hepatitis C Screening  Completed  . HIV Screening  Completed   Outpatient Encounter Medications as of 12/03/2018  Medication Sig  . benazepril (LOTENSIN) 40 MG tablet Take 1 tablet (40 mg total) by mouth daily.  . carvedilol (COREG) 25 MG tablet Take 1 tablet (25 mg total) by mouth 2 (two) times daily with a meal.  . clobetasol cream (TEMOVATE) 4.48 % Apply 1  application topically 2 (two) times daily.  . dapagliflozin propanediol (FARXIGA) 10 MG TABS tablet Take 10 mg by mouth daily.  . fenofibrate (TRICOR) 145 MG tablet Take 1 tablet (145 mg total) by mouth daily.  Marland Kitchen gabapentin (NEURONTIN) 300 MG capsule Take 2 capsules (600 mg total) by mouth 2 (two) times daily.  . Glucose Blood (BLOOD GLUCOSE TEST STRIPS) STRP 1 strip by In Vitro route daily.  . insulin degludec (TRESIBA FLEXTOUCH) 100 UNIT/ML SOPN FlexTouch Pen Inject 0.25 mLs (25 Units total) into the skin daily.  . insulin lispro (HUMALOG) 100 UNIT/ML injection Inject 0.5 mLs (50 Units total) into the skin 2 (two) times daily before a meal. (Patient taking differently: Inject 25 Units into the skin 2 (two) times daily before a meal. )  . metFORMIN (GLUCOPHAGE) 500 MG tablet TAKE 2 TABLETS BY MOUTH TWICE A DAY  . warfarin (COUMADIN) 10 MG tablet Take 1 tablet (10 mg total) by mouth daily. Take as directed  . hydrochlorothiazide (HYDRODIURIL) 25 MG tablet Take 1 tablet (25 mg total) by mouth daily. (Patient not taking: Reported on 12/03/2018)  . Semaglutide, 1 MG/DOSE, (OZEMPIC, 1 MG/DOSE,) 2 MG/1.5ML SOPN Inject 1 mg into the skin once a week. (Patient not taking: Reported on 12/07/2018)  . warfarin (COUMADIN) 4 MG tablet TAKE 1 TABLET BY MOUTH EVERY DAY (Patient not taking: Reported on 12/03/2018)  .  warfarin (COUMADIN) 5 MG tablet TAKE 1 TABLET BY MOUTH EVERY DAY (Patient not taking: Reported on 12/03/2018)   No facility-administered encounter medications on file as of 12/03/2018.     Assessment and Plan:  1. Adherence Pt reports no issues remembering to take medications and rx fill history indicates compliance. Pt struggled to remember which medications he was taking but also did not have any medications on hand as he was called at work. When reminded of medication name, pt was able to provide dose, strength, and frequency appropriately.   2. HTN Hx of hypertension on carvedilol 25mg  BID,  benazepril 40mg  daily. Pt has a blood pressure cuff at his workplace and reports taking his BP daily with an avg of 140/80 or slightly higher. Pt was unsure what his goal BP was but knew that his was higher than his goal. Counseled pt on low salt diet and slowly increasing exercise such as walking laps at work. Pt endorsed understanding of BP goal of <130/80.  3. Afib, Hx of valve replacement Hx of atrial fibrillation with a mechanical valve replacement on warfarin 10mg  daily. INR (7/24) 2.9, (7/15) 1.8, (5/28) 2.0. Pt is followed by INR clinic with goal INR 2.5-3.5 and dose was recently increased. Pt was able to verbalize signs/symptoms of bleeding and reports no issues.   4. Hyperlipidemia Hx of hyperlipidemia on fenofibrate 145mg  daily. Lipid panel (2018) TG 459, LDL not calc, HDL 30. Pt reports never being on a statin. May benefit from repeat lipid panel as well as triglyceride lowering therapy.   5. DM with peripheral neuropathy Hx of diabetes on metformin 1000mg  BID, Humalog 25 units SQ BID with meals, Tresiba 25 units SQ daily, Farxiga 10mg  daily, and gabapentin 600mg  BID for neuropathy. Pt has been prescribed semaglutide but is waiting on mail in via PAP. A1c (07/12/18) 11.3, (03/08/18) 12.8. Pt reports having a BG meter at home with readings averaging 153-179 but does not frequently use d/t pain in fingers. Pt was not sure what BG goal was but endorsed understanding of 80-130 when fasting. Pt endorsed episodes of hypoglycemia when taking Humalog 50 units BID as originally prescribed but was able to recognize symptoms and reduced to 25 units BID. Pt to F/U with PCP when semaglutide arrives to adjust insulin doses to avoid hypoglycemia. Pt endorsed frustration regarding diabetes control but was encouraged that his A1c came down over the past year.   RTC: 1y (12/03/2019)  Wess BottsEmma Garnett Rekowski, PharmD Candidate [facility] [employer]  Cosigned: Iona BeardKeri K. Joelene MillinHarrison, BS, PharmD Medication Management  Clinic Clinic-Pharmacy Operations Coordinator [phone]

## 2018-12-07 ENCOUNTER — Other Ambulatory Visit: Payer: Self-pay

## 2018-12-13 ENCOUNTER — Telehealth: Payer: Self-pay | Admitting: Pharmacist

## 2018-12-13 NOTE — Telephone Encounter (Signed)
12/13/2018 9:23:39 AM - Apidra Solostar faxed to [employer]  12/13/2018 Faxed [employer] application for [employer] Inject 50 units under the skin two times a day, 6 boxes.AJ  12/13/2018 9:22:31 AM - Wilder Glade faxed to [employer]  12/13/2018 [place] application for Farxiga 10mg  Take one tablet by mouth every day.AJ  12/13/2018 9:25:02 AM - Humalog Kwikpen faxed to [facility]  [facility] Faxed Lilly application for Humalog Kwikpen Inject 50 units under the skin two times a day before a meal. #8 boxes.AJ  12/13/2018 9:28:10 AM - 21/10/2018 U100 Flextouch & tips & Ozempic  12/13/2018 Faxed Novo Nordisk application for 21/10/2018 U100 Flextouch Inject 25 units under the skin every day. # 2 boxes. Novofine 32G tips Use daily with [employer] Flextouch  # 2 boxes. and also Ozempic 1mg  pens & needles Inject 1mg  under the skin once a week. # 4 boxes.Tyler Aas

## 2018-12-17 ENCOUNTER — Telehealth: Payer: Self-pay | Admitting: Family Medicine

## 2018-12-17 ENCOUNTER — Other Ambulatory Visit: Payer: Self-pay | Admitting: Family Medicine

## 2018-12-17 MED ORDER — INSULIN LISPRO 100 UNIT/ML ~~LOC~~ SOLN: SUBCUTANEOUS | 11 refills | Status: DC

## 2018-12-17 NOTE — Telephone Encounter (Signed)
Leonides Sake, with med management clinic pharmacy,  calling to request clarification on insulin lispro (HUMALOG) 100 UNIT/ML injection.

## 2019-01-01 ENCOUNTER — Telehealth: Payer: Self-pay | Admitting: Family Medicine

## 2019-01-01 DIAGNOSIS — E119 Type 2 diabetes mellitus without complications: Secondary | ICD-10-CM

## 2019-01-01 DIAGNOSIS — Z954 Presence of other heart-valve replacement: Secondary | ICD-10-CM

## 2019-01-01 DIAGNOSIS — E1169 Type 2 diabetes mellitus with other specified complication: Secondary | ICD-10-CM

## 2019-01-01 NOTE — Telephone Encounter (Signed)
Called pt to schedule virtual appt for Monday. Pt is wondering if he can come in Monday morning and do his inr at the lab. Please advise.

## 2019-01-02 ENCOUNTER — Telehealth: Payer: Self-pay | Admitting: Family Medicine

## 2019-01-02 NOTE — Telephone Encounter (Signed)
Tried to call patient, no answer, unable to leave a message, will try again.  

## 2019-01-02 NOTE — Telephone Encounter (Signed)
Ok and ordered

## 2019-01-02 NOTE — Telephone Encounter (Signed)
Called patient, scheduled lab appointment for 8:30 Monday morning.

## 2019-01-02 NOTE — Addendum Note (Signed)
Addended by: Golden Pop A on: 01/02/2019 09:40 AM   Modules accepted: Orders

## 2019-01-02 NOTE — Telephone Encounter (Signed)
Called pt to let him know that lab orders are in and he can come in anytime before appt, no answer, left vm

## 2019-01-07 ENCOUNTER — Ambulatory Visit (INDEPENDENT_AMBULATORY_CARE_PROVIDER_SITE_OTHER): Payer: Self-pay | Admitting: Family Medicine

## 2019-01-07 ENCOUNTER — Other Ambulatory Visit: Payer: Self-pay

## 2019-01-07 ENCOUNTER — Encounter: Payer: Self-pay | Admitting: Family Medicine

## 2019-01-07 DIAGNOSIS — E1169 Type 2 diabetes mellitus with other specified complication: Secondary | ICD-10-CM

## 2019-01-07 DIAGNOSIS — F321 Major depressive disorder, single episode, moderate: Secondary | ICD-10-CM

## 2019-01-07 DIAGNOSIS — I10 Essential (primary) hypertension: Secondary | ICD-10-CM

## 2019-01-07 DIAGNOSIS — I482 Chronic atrial fibrillation, unspecified: Secondary | ICD-10-CM

## 2019-01-07 DIAGNOSIS — Z954 Presence of other heart-valve replacement: Secondary | ICD-10-CM

## 2019-01-07 LAB — PROTIME-INR: INR: 3.7 — AB (ref 0.9–1.1)

## 2019-01-07 LAB — COAGUCHEK XS/INR WAIVED
INR: 3.7 — ABNORMAL HIGH (ref 0.9–1.1)
Prothrombin Time: 44.1 s

## 2019-01-07 LAB — BAYER DCA HB A1C WAIVED: HB A1C (BAYER DCA - WAIVED): 8.9 % — ABNORMAL HIGH (ref <7.0)

## 2019-01-07 MED ORDER — WARFARIN SODIUM 5 MG PO TABS: 5.0000 mg | ORAL_TABLET | ORAL | 0 refills | Status: DC

## 2019-01-07 MED ORDER — WARFARIN SODIUM 4 MG PO TABS: 4.0000 mg | ORAL_TABLET | ORAL | 0 refills | Status: DC

## 2019-01-07 NOTE — Assessment & Plan Note (Signed)
Discussed INR

## 2019-01-07 NOTE — Progress Notes (Signed)
There were no vitals taken for this visit.   Subjective:    Patient ID: Rodney Plumber [name], male    DOB: 11/14/1960, 58 y.o.   MRN: [id number]  HPI: Rodney Dixon. is a 58 y.o. male  Med check  Patient with elevated A1c reviewed just started medications this month and has not had full effect.  Patient without insurance and having financial difficulty. Discussed medications patient now able to get his medications through the medication management clinic and that helps. Discussed INR.  Patient taking 10 mg daily as INR had been low.  No bleeding bruising issues. Blood pressure doing well without problems.  Relevant past medical, surgical, family and social history reviewed and updated as indicated. Interim medical history since our last visit reviewed. Allergies and medications reviewed and updated.  Review of Systems  Constitutional: Negative.   Respiratory: Negative.   Cardiovascular: Negative.     Per HPI unless specifically indicated above     Objective:    There were no vitals taken for this visit.  Wt Readings from Last 3 Encounters:  11/30/18 250 lb (113.4 kg)  11/21/18 255 lb (115.7 kg)  07/19/18 249 lb 8 oz (113.2 kg)    Physical Exam  Results for orders placed or performed in visit on 11/30/18  CoaguChek XS/INR Waived  Result Value Ref Range   INR 2.9 (H) 0.9 - 1.1   Prothrombin Time 34.7 sec      Assessment & Plan:   Problem List Items Addressed This Visit      Cardiovascular and Mediastinum   Chronic atrial fibrillation    Discussed INR      Relevant Medications   warfarin (COUMADIN) 5 MG tablet   warfarin (COUMADIN) 4 MG tablet   Essential (primary) hypertension    The current medical regimen is effective;  continue present plan and medications.       Relevant Medications   warfarin (COUMADIN) 5 MG tablet   warfarin (COUMADIN) 4 MG tablet     Endocrine   Diabetes mellitus associated with hormonal etiology ([place])    Discussed diabetes  control and use of medications        Other   S/P aortic valve replacement with metallic valve    Reviewed INR and warfarin dosage had been 10 mg once a day will alternate with 9 mg on Tuesday and Thursday      Depression, major, single episode, moderate ([place])    The current medical regimen is effective;  continue present plan and medications.       Presence of other heart-valve replacement - Primary   Relevant Orders   CoaguChek XS/INR Waived       Telemedicine using audio/video telecommunications for a synchronous communication visit. Today's visit due to COVID-19 isolation precautions I connected with and verified that I am speaking with the correct person using two identifiers.   I discussed the limitations, risks, security and privacy concerns of performing an evaluation and management service by telecommunication and the availability of in person appointments. I also discussed with the patient that there may be a patient responsible charge related to this service. The patient expressed understanding and agreed to proceed. The patient's location is work. I am at home.   I discussed the assessment and treatment plan with the patient. The patient was provided an opportunity to ask questions and all were answered. The patient agreed with the plan and demonstrated an understanding of the instructions.   The  patient was advised to call back or seek an in-person evaluation if the symptoms worsen or if the condition fails to improve as anticipated.   I provided 21+ minutes of time during this encounter. Follow up plan: Return in about 4 weeks (around 02/04/2019) for PT INR.

## 2019-01-07 NOTE — Assessment & Plan Note (Signed)
The current medical regimen is effective;  continue present plan and medications.  

## 2019-01-07 NOTE — Assessment & Plan Note (Signed)
Discussed diabetes control and use of medications

## 2019-01-07 NOTE — Assessment & Plan Note (Signed)
Reviewed INR and warfarin dosage had been 10 mg once a day will alternate with 9 mg on Tuesday and Thursday

## 2019-01-09 ENCOUNTER — Other Ambulatory Visit: Payer: Self-pay | Admitting: Family Medicine

## 2019-01-09 DIAGNOSIS — I10 Essential (primary) hypertension: Secondary | ICD-10-CM

## 2019-01-21 ENCOUNTER — Other Ambulatory Visit: Payer: Self-pay | Admitting: Family Medicine

## 2019-01-21 DIAGNOSIS — I10 Essential (primary) hypertension: Secondary | ICD-10-CM

## 2019-01-21 NOTE — Telephone Encounter (Signed)
Requested medication (s) are due for refill today: yes  Requested medication (s) are on the active medication list: yes   Future visit scheduled: yes  Notes to clinic:  Review for refill  Requested Prescriptions  Pending Prescriptions Disp Refills   carvedilol (COREG) 25 MG tablet 180 tablet 3    Sig: Take 1 tablet (25 mg total) by mouth 2 (two) times daily with a meal.     Cardiovascular:  Beta Blockers Failed - 01/21/2019  2:27 PM      Failed - Last BP in normal range    BP Readings from Last 1 Encounters:  11/30/18 (!) 148/87         Passed - Last Heart Rate in normal range    Pulse Readings from Last 1 Encounters:  11/30/18 88         Passed - Valid encounter within last 6 months    Recent Outpatient Visits          2 weeks ago Presence of other heart-valve replacement   [facility] Crissman, Redge GainerMark A, MD   1 month ago Presence of other heart-valve replacement   [facility] [place]annady, Dorie RankJolene T, NP   2 months ago Rash   [facility] Aver[place]ady, [place]olene T, NP   3 months ago Chronic atrial fibrillation   [facility] Crissman, Redge GainerMark A, MD   3 months ago [facility] atrial [facility]   [facility], Redge GainerMark A, MD      Future Appointments            In 2 weeks Cannady, Dorie RankJolene T, NP [facility], [facility]            hydrochlorothiazide (HYDRODIURIL) 25 MG tablet 90 tablet 3    Sig: Take 1 tablet (25 mg total) by mouth daily.     Cardiovascular: Diuretics - Thiazide Failed - 01/21/2019  2:27 PM      Failed - Last BP in normal range    BP Readings from Last 1 Encounters:  11/30/18 (!) 148/87         Passed - Ca in normal range and within 360 days    Calcium  Date Value Ref Range Status  10/22/2018 10.0 8.7 - 10.2 mg/dL Final         Passed - Cr in normal range and within 360 days    Creatinine, Ser  Date Value Ref Range Status  10/22/2018 0.96 0.76 - 1.27 mg/dL Final         Passed - K in  normal range and within 360 days    Potassium  Date Value Ref Range Status  10/22/2018 3.5 3.5 - 5.2 mmol/L Final         Passed - Na in normal range and within 360 days    Sodium  Date Value Ref Range Status  10/22/2018 139 134 - 144 mmol/L Final         Passed - Valid encounter within last 6 months    Recent Outpatient Visits          2 weeks ago Presence of other heart-valve replacement   [facility] Crissman, Redge GainerMark A, MD   1 month ago Presence of other heart-valve replacement   [facility] [place], Dorie RankJolene T, NP   2 months ago Rash   [facility] [place], [place] T, NP   3 months ago Chronic atrial fibrillation   [facility] Crissman, Redge GainerMark A, MD  3 months ago Chronic atrial fibrillation   [facility] Crissman, Jeannette How, MD      Future Appointments            In 2 weeks Cannady, Barbaraann Faster, NP [facility], [facility]

## 2019-01-21 NOTE — Telephone Encounter (Signed)
Routing to provider  

## 2019-01-21 NOTE — Telephone Encounter (Signed)
carvedilol (COREG) 25 MG tablet  hydrochlorothiazide (HYDRODIURIL) 25 MG tablet  Send to [facility]

## 2019-01-22 MED ORDER — HYDROCHLOROTHIAZIDE 25 MG PO TABS: 25.0000 mg | ORAL_TABLET | Freq: Every day | ORAL | 3 refills | Status: DC

## 2019-01-22 MED ORDER — CARVEDILOL 25 MG PO TABS: 25.0000 mg | ORAL_TABLET | Freq: Two times a day (BID) | ORAL | 3 refills | Status: DC

## 2019-02-06 ENCOUNTER — Ambulatory Visit (INDEPENDENT_AMBULATORY_CARE_PROVIDER_SITE_OTHER): Payer: Self-pay | Admitting: Nurse Practitioner

## 2019-02-06 ENCOUNTER — Other Ambulatory Visit: Payer: Self-pay

## 2019-02-06 ENCOUNTER — Encounter: Payer: Self-pay | Admitting: Nurse Practitioner

## 2019-02-06 VITALS — BP 157/92 | HR 82 | Temp 97.8°F | Ht 68.0 in | Wt 255.0 lb

## 2019-02-06 DIAGNOSIS — E119 Type 2 diabetes mellitus without complications: Secondary | ICD-10-CM

## 2019-02-06 DIAGNOSIS — Z954 Presence of other heart-valve replacement: Secondary | ICD-10-CM

## 2019-02-06 MED ORDER — OZEMPIC (1 MG/DOSE) 2 MG/1.5ML ~~LOC~~ SOPN: 1.0000 mg | PEN_INJECTOR | SUBCUTANEOUS | 4 refills | Status: DC

## 2019-02-06 NOTE — Patient Instructions (Signed)
Bleeding Precautions When on Anticoagulant Therapy, Adult °Anticoagulant therapy, also called blood thinner therapy, is medicine that helps to prevent and treat blood clots. The medicine works by stopping blood clots from forming or growing. Blood clots that form in your blood vessels can be dangerous. They can break loose and travel to the heart, lungs, or brain. This increases the risk of a heart attack, stroke, or blocked lung artery (pulmonary embolism). °Anticoagulants also increase the risk of bleeding. Try to protect yourself from cuts and other injuries that can cause bleeding. It is important to take anticoagulants exactly as told by your health care provider. °Why do I need to be on anticoagulant therapy? °You may need this medicine if you are at risk of developing a blood clot. Conditions that increase your risk of a blood clot include: °· Being born with heart disease or a heart malformation (congenital heart disease). °· Developing heart disease. °· Having had surgery, such as valve replacement. °· Having had a serious accident or other type of severe injury (trauma). °· Having certain types of cancer. °· Having certain diseases that can increase blood clotting. °· Having a high risk of stroke or heart attack. °· Having atrial fibrillation (AF). °What are the common anticoagulant medicines? °There are several types of anticoagulant medicines. The most common types are: °· Medicines that you take by mouth (oral medicines), such as: °? Warfarin. °? Novel oral anticoagulants (NOACs), such as: °? Direct thrombin inhibitors (dabigatran). °? Factor Xa inhibitors (apixaban, edoxaban, and rivaroxaban). °· Injections, such as: °? Unfractionated heparin. °? Low molecular weight heparin. °These anticoagulants work in different ways to prevent blood clots. They also have different risks and side effects. °What do I need to remember while on anticoagulant therapy? °Taking anticoagulants °· Take your medicine at the  same time every day. If you forget to take your medicine, take it as soon as you remember. Do not double your dosage of medicine if you miss a whole day. Take your normal dose and call your health care provider. °· Do not stop taking your medicine unless your health care provider approves. Stopping the medicine can increase your risk of developing a blood clot. °Taking other medicines °· Take over-the-counter and prescriptions medicines only as told by your health care provider. °· Do not take over-the-counter NSAIDs, including aspirin and ibuprofen, while you are on anticoagulant therapy. These medicines increase your risk of dangerous bleeding. °· Get approval from your health care provider before you start taking any new medicines, vitamins, or herbal products. Some of these could interfere with your therapy. °General instructions °· Keep all follow-up visits as told by your health care provider. This is important. °· If you are pregnant or trying to get pregnant, talk with a health care provider about anticoagulants. Some of these medicines are not safe to take during pregnancy. °· Tell all health care providers, including your dentist, that you are on anticoagulant therapy. It is especially important to tell providers before you have any surgery, medical procedures, or dental work done. °What precautions should I take? ° °· Be very careful when using knives, scissors, or other sharp objects. °· Use an electric razor instead of a blade. °· Do not use toothpicks. °· Use a soft-bristled toothbrush. Brush your teeth gently. °· Always wear shoes outdoors and wear slippers indoors. °· Be careful when cutting your fingernails and toenails. °· Place bath mats in the bathroom. If possible, install handrails as well. °· Wear gloves while you do   yard work. °· Wear your seat belt. °· Prevent falls by removing loose rugs and extension cords from areas where you walk. Use a cane or walker if you need it. °· Avoid  constipation by: °? Drinking enough fluid to keep your urine clear or pale yellow. °? Eating foods that are high in fiber, such as fresh fruits and vegetables, whole grains, and beans. °? Limiting foods that are high in fat and processed sugars, such as fried and sweet foods. °· Do not play contact sports or participate in other activities that have a high risk for injury. °What other precautions are important if on warfarin therapy? °If you are taking a type of anticoagulant called warfarin, make sure you: °· Work with a diet and nutrition specialist (dietitian) to make an eating plan. Do not make any sudden changes to your diet after you have started your eating plan. °· Do not drink alcohol. It can interfere with your medicine and increase your risk of an injury that causes bleeding. °· Get regular blood tests as told by your health care provider. °What are some questions to ask my health care provider? °· Why do I need anticoagulant therapy? °· What is the best anticoagulant therapy for my condition? °· How long will I need anticoagulant therapy? °· What are the side effects of anticoagulant therapy? °· When should I take my medicine? What should I do if I forget to take it? °· Will I need to have regular blood tests? °· Do I need to change my diet? Are there foods or drinks that I should avoid? °· What activities are safe for me? °· What should I do if I want to get pregnant? °Contact a health care provider if: °· You miss a dose of medicine: °? And you are not sure what to do. °? For more than one day. °· You have: °? Menstrual bleeding that is heavier than normal. °? Bloody or brown urine. °? Easy bruising. °? Black and tarry stool or bright red stool. °? Side effects from your medicine. °· You feel weak or dizzy. °· You become pregnant. °Get help right away if: °· You have bleeding that will not stop within 20 minutes from: °? The nose. °? The gums. °? A cut on the skin. °· You have a severe headache or  stomachache. °· You vomit or cough up blood. °· You fall or hit your head. °Summary °· Anticoagulant therapy, also called blood thinner therapy, is medicine that helps to prevent and treat blood clots. °· Anticoagulants work in different ways to prevent blood clots. They also have different risks and side effects. °· Talk with your health care provider about any precautions that you should take while on anticoagulant therapy. °This information is not intended to replace advice given to you by your health care provider. Make sure you discuss any questions you have with your health care provider. °Document Released: 04/06/2015 Document Revised: 08/15/2018 Document Reviewed: 07/12/2016 °Elsevier Patient Education © 2020 [employer] ° °

## 2019-02-06 NOTE — Progress Notes (Signed)
   Ht 5\' 8"  (1.727 m)   BMI 38.01 kg/m    Subjective:    Patient ID: Rodney Plumber [name], male    DOB: [birth year], 59 y.o.   MRN: [id number]  CC: Coumadin management  HPI: This patient is a 58 y.o. male who presents for coumadin management. The expected duration of coumadin treatment is lifelong The reason for anticoagulation is  mechanical heart valve.  INR today 2.9.  Present Coumadin dose: 10 mg once a day, alternate with 9 mg on Tuesday and Thursday Goal: 2.5-3.5 The patient does not have an active anticoagulation episode. Excessive bruising: no Nose bleeding: no Rectal bleeding: no Prolonged menstrual cycles: N/A Eating diet with consistent amounts of foods containing Vitamin K:yes Any recent antibiotic use? no  Relevant past medical, surgical, family and social history reviewed and updated as indicated. Interim medical history since our last visit reviewed. Allergies and medications reviewed and updated.  ROS: Per HPI unless specifically indicated above     Objective:    Ht 5\' 8"  (1.727 m)   BMI 38.01 kg/m   Wt Readings from Last 3 Encounters:  11/30/18 250 lb (113.4 kg)  11/21/18 255 lb (115.7 kg)  07/19/18 249 lb 8 oz (113.2 kg)     General: Well appearing, well nourished in no distress.  Normal mood and affect. Skin: No excessive bruising or rash  Last INR: 3.7    Last CBC:  Lab Results  Component Value Date   WBC 7.5 03/05/2018   HGB 14.1 03/05/2018   HCT 42.6 03/05/2018   MCV 83.4 03/05/2018   PLT 253 03/05/2018    Results for orders placed or performed in visit on 01/07/19  CoaguChek XS/INR Waived  Result Value Ref Range   INR 3.7 (H) 0.9 - 1.1   Prothrombin Time 44.1 sec       Assessment:     ICD-10-CM   1. S/P aortic valve replacement with metallic valve  Z60.1 CoaguChek XS/INR Waived    Plan:   Discussed current plan face-to-face with patient. For coumadin dosing, elected to continue current dose. Will plan to recheck INR in 1 month.

## 2019-02-07 LAB — COAGUCHEK XS/INR WAIVED
INR: 2.9 — ABNORMAL HIGH (ref 0.9–1.1)
Prothrombin Time: 35 s

## 2019-02-22 ENCOUNTER — Telehealth: Payer: Self-pay | Admitting: Family Medicine

## 2019-02-22 NOTE — Telephone Encounter (Signed)
The pharm does not have tresiba 100 unit in stock they do have 200 unit. Pharm would like to know if they can switch pt to 200 mg until 100 mg comes in

## 2019-02-25 NOTE — Telephone Encounter (Signed)
Yes, that's fine 

## 2019-02-25 NOTE — Telephone Encounter (Signed)
Called and left a message on answering machine.

## 2019-03-06 ENCOUNTER — Other Ambulatory Visit: Payer: Self-pay

## 2019-03-06 ENCOUNTER — Encounter: Payer: Self-pay | Admitting: Nurse Practitioner

## 2019-03-06 ENCOUNTER — Ambulatory Visit (INDEPENDENT_AMBULATORY_CARE_PROVIDER_SITE_OTHER): Payer: Self-pay | Admitting: Nurse Practitioner

## 2019-03-06 VITALS — BP 154/96 | HR 81 | Temp 98.5°F

## 2019-03-06 DIAGNOSIS — Z954 Presence of other heart-valve replacement: Secondary | ICD-10-CM

## 2019-03-06 LAB — COAGUCHEK XS/INR WAIVED
INR: 2.8 — ABNORMAL HIGH (ref 0.9–1.1)
Prothrombin Time: 33.1 s

## 2019-03-06 NOTE — Patient Instructions (Signed)
Vitamin K Foods and Warfarin Warfarin is a blood thinner (anticoagulant). Anticoagulant medicines help prevent the formation of blood clots. These medicines work by decreasing the activity of vitamin K, which promotes normal blood clotting. When you take warfarin, problems can occur from suddenly increasing or decreasing the amount of vitamin K that you eat from one day to the next. Problems may include:  Blood clots.  Bleeding. What general guidelines do I need to follow? To avoid problems when taking warfarin:  Eat a balanced diet that includes: ? Fresh fruits and vegetables. ? Whole grains. ? Low-fat dairy products. ? Lean proteins, such as fish, eggs, and lean cuts of meat.  Keep your intake of vitamin K consistent from day to day. To do this: ? Avoid eating large amounts of vitamin K one day and low amounts of vitamin K the next day. ? If you take a multivitamin that contains vitamin K, be sure to take it every day. ? Know which foods contain vitamin K. Use the lists below to understand serving sizes and the amount of vitamin K in one serving.  Avoid major changes in your diet. If you are going to change your diet, talk with your health care provider before making changes.  Work with a nutrition specialist (dietitian) to develop a meal plan that works best for you.  High vitamin K foods Foods that are high in vitamin K contain more than 100 mcg (micrograms) per serving. These include:  Broccoli (cooked) -  cup has 110 mcg.  Brussels sprouts (cooked) -  cup has 109 mcg.  Greens, beet (cooked) -  cup has 350 mcg.  Greens, collard (cooked) -  cup has 418 mcg.  Greens, turnip (cooked) -  cup has 265 mcg.  Green onions or scallions -  cup has 105 mcg.  Kale (fresh or frozen) -  cup has 531 mcg.  Parsley (raw) - 10 sprigs has 164 mcg.  Spinach (cooked) -  cup has 444 mcg.  Swiss chard (cooked) -  cup has 287 mcg. Moderate vitamin K foods Foods that have a  moderate amount of vitamin K contain 25-100 mcg per serving. These include:  Asparagus (cooked) - 5 spears have 38 mcg.  Black-eyed peas (dried) -  cup has 32 mcg.  Cabbage (cooked) -  cup has 37 mcg.  Kiwi fruit - 1 medium has 31 mcg.  Lettuce - 1 cup has 57-63 mcg.  Okra (frozen) -  cup has 44 mcg.  Prunes (dried) - 5 prunes have 25 mcg.  Watercress (raw) - 1 cup has 85 mcg. Low vitamin K foods Foods low in vitamin K contain less than 25 mcg per serving. These include:  Artichoke - 1 medium has 18 mcg.  Avocado - 1 oz. has 6 mcg.  Blueberries -  cup has 14 mcg.  Cabbage (raw) -  cup has 21 mcg.  Carrots (cooked) -  cup has 11 mcg.  Cauliflower (raw) -  cup has 11 mcg.  Cucumber with peel (raw) -  cup has 9 mcg.  Grapes -  cup has 12 mcg.  Mango - 1 medium has 9 mcg.  Nuts - 1 oz. has 15 mcg.  Pear - 1 medium has 8 mcg.  Peas (cooked) -  cup has 19 mcg.  Pickles - 1 spear has 14 mcg.  Pumpkin seeds - 1 oz. has 13 mcg.  Sauerkraut (canned) -  cup has 16 mcg.  Soybeans (cooked) -  cup has 16 mcg.    Tomato (raw) - 1 medium has 10 mcg.  Tomato sauce -  cup has 17 mcg. Vitamin K-free foods If a food contain less than 5 mcg per serving, it is considered to have no vitamin K. These foods include:  Bread and cereal products.  Cheese.  Eggs.  Fish and shellfish.  Meat and poultry.  Milk and dairy products.  Sunflower seeds. Actual amounts of vitamin K in foods may be different depending on processing. Talk with your dietitian about what foods you can eat and what foods you should avoid. This information is not intended to replace advice given to you by your health care provider. Make sure you discuss any questions you have with your health care provider. Document Released: 02/20/2009 Document Revised: 04/07/2017 Document Reviewed: 07/29/2015 Elsevier Patient Education  2020 Elsevier Inc.  

## 2019-03-06 NOTE — Assessment & Plan Note (Signed)
On Coumadin, INR today 2.8.  Continue current Coumadin dose, 10 mg once a day, alternate with 9 mg on Tuesday and Thursday, and return in 4 weeks for INR check + chronic disease visit.

## 2019-03-06 NOTE — Progress Notes (Signed)
   BP (!) 154/96   Pulse 81   Temp 98.5 F (36.9 C) (Oral)   SpO2 98%    Subjective:    Patient ID: Rodney Plumber [name], male    DOB: 1960/10/05, 58 y.o.   MRN: [id number]  CC: Coumadin management  HPI: This patient is a 58 y.o. male who presents for coumadin management. The expected duration of coumadin treatment is lifelong The reason for anticoagulation is  mechanical heart valve.  Today INR 2.8.  Present Coumadin dose: 10 mg once a day, alternate with 9 mg on Tuesday and Thursday Goal: 2.5-3.5 The patient does not have an active anticoagulation episode. Excessive bruising: no Nose bleeding: no Rectal bleeding: no Prolonged menstrual cycles: N/A Eating diet with consistent amounts of foods containing Vitamin K:yes Any recent antibiotic use? no  Relevant past medical, surgical, family and social history reviewed and updated as indicated. Interim medical history since our last visit reviewed. Allergies and medications reviewed and updated.  ROS: Per HPI unless specifically indicated above     Objective:    BP (!) 154/96   Pulse 81   Temp 98.5 F (36.9 C) (Oral)   SpO2 98%   Wt Readings from Last 3 Encounters:  02/06/19 255 lb (115.7 kg)  11/30/18 250 lb (113.4 kg)  11/21/18 255 lb (115.7 kg)     General: Well appearing, well nourished in no distress.  Normal mood and affect. Skin: No excessive bruising or rash  Last INR: 2.9    Last CBC:  Lab Results  Component Value Date   WBC 7.5 03/05/2018   HGB 14.1 03/05/2018   HCT 42.6 03/05/2018   MCV 83.4 03/05/2018   PLT 253 03/05/2018    Results for orders placed or performed in visit on 02/06/19  CoaguChek XS/INR Waived  Result Value Ref Range   INR 2.9 (H) 0.9 - 1.1   Prothrombin Time 35.0 sec       Assessment:     ICD-10-CM   1. S/P aortic valve replacement with metallic valve  L24.4 CoaguChek XS/INR Waived    Plan:   Discussed current plan face-to-face with patient. For coumadin dosing, elected to  continue current dose. Will plan to recheck INR in 1 month.

## 2019-03-08 ENCOUNTER — Telehealth: Payer: Self-pay | Admitting: Pharmacist

## 2019-03-08 NOTE — Telephone Encounter (Signed)
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 In order for this application to be processed they need either the 2019 1040 tax form or 2019 1099 form to be faxed to them with patient ID# [id number]. I then called patient and he still has not filed taxes for 2019-explained that I need the 1099 to send to [employer] for this to be processed, he will contact his accountant for this information.Rodney Dixon

## 2019-03-31 ENCOUNTER — Encounter: Payer: Self-pay | Admitting: Nurse Practitioner

## 2019-04-03 ENCOUNTER — Ambulatory Visit (INDEPENDENT_AMBULATORY_CARE_PROVIDER_SITE_OTHER): Payer: Self-pay | Admitting: Nurse Practitioner

## 2019-04-03 ENCOUNTER — Encounter: Payer: Self-pay | Admitting: Nurse Practitioner

## 2019-04-03 ENCOUNTER — Telehealth: Payer: Self-pay | Admitting: Pharmacy Technician

## 2019-04-03 ENCOUNTER — Other Ambulatory Visit: Payer: Self-pay

## 2019-04-03 VITALS — BP 140/88 | HR 84 | Temp 98.5°F | Ht 68.0 in | Wt 255.0 lb

## 2019-04-03 DIAGNOSIS — Z954 Presence of other heart-valve replacement: Secondary | ICD-10-CM

## 2019-04-03 DIAGNOSIS — Z6838 Body mass index (BMI) 38.0-38.9, adult: Secondary | ICD-10-CM

## 2019-04-03 DIAGNOSIS — E1169 Type 2 diabetes mellitus with other specified complication: Secondary | ICD-10-CM

## 2019-04-03 DIAGNOSIS — D6869 Other thrombophilia: Secondary | ICD-10-CM | POA: Insufficient documentation

## 2019-04-03 DIAGNOSIS — Z6841 Body Mass Index (BMI) 40.0 and over, adult: Secondary | ICD-10-CM | POA: Insufficient documentation

## 2019-04-03 DIAGNOSIS — E538 Deficiency of other specified B group vitamins: Secondary | ICD-10-CM

## 2019-04-03 DIAGNOSIS — Z9114 Patient's other noncompliance with medication regimen: Secondary | ICD-10-CM

## 2019-04-03 DIAGNOSIS — I10 Essential (primary) hypertension: Secondary | ICD-10-CM

## 2019-04-03 DIAGNOSIS — Z6839 Body mass index (BMI) 39.0-39.9, adult: Secondary | ICD-10-CM | POA: Insufficient documentation

## 2019-04-03 DIAGNOSIS — E785 Hyperlipidemia, unspecified: Secondary | ICD-10-CM

## 2019-04-03 DIAGNOSIS — E1159 Type 2 diabetes mellitus with other circulatory complications: Secondary | ICD-10-CM

## 2019-04-03 DIAGNOSIS — I482 Chronic atrial fibrillation, unspecified: Secondary | ICD-10-CM

## 2019-04-03 LAB — COAGUCHEK XS/INR WAIVED
INR: 2.1 — ABNORMAL HIGH (ref 0.9–1.1)
Prothrombin Time: 25.7 s

## 2019-04-03 LAB — BAYER DCA HB A1C WAIVED: HB A1C (BAYER DCA - WAIVED): 9 % — ABNORMAL HIGH (ref <7.0)

## 2019-04-03 MED ORDER — METFORMIN HCL 500 MG PO TABS: ORAL_TABLET | ORAL | 3 refills | Status: DC

## 2019-04-03 NOTE — Assessment & Plan Note (Signed)
INR 2.1 today.  Recommend he increase Coumadin to 10 MG every day, except Thursday to continue 9 MG.  Will have him return in one week for INR recheck.

## 2019-04-03 NOTE — Assessment & Plan Note (Signed)
With multiple co morbidities present.  Poor adherence to diabetic diet.  Recommend continued focus on health diet choices and regular physical activity (30 minutes 5 days a week).  

## 2019-04-03 NOTE — Assessment & Plan Note (Addendum)
Chronic, ongoing with A1C 9.0% today.  Have recommend he return to taking Metformin (refills sent) and educated him on this.  Continue Ozempic and [place].  Focus heavily on diabetic diet and modest weight loss, decreasing carb and sugar intake at home.  Continue to check BS TID.  Will continue to work with CCM team to ensure adherence to medication regimen, since self pay concern he will not take medication as instructed.  Return in 3 months.

## 2019-04-03 NOTE — Assessment & Plan Note (Signed)
Monitor CBC with use of daily Coumadin for mechanical valve and a-fib.  Denies any current bleeding episodes. 

## 2019-04-03 NOTE — Telephone Encounter (Signed)
Patient failed to provide 2019 tax return.  No additional medication assistance will be provided by [facility] without the required proof of income documentation.  Patient notified by letter.  [facility]

## 2019-04-03 NOTE — Assessment & Plan Note (Signed)
Major concern and reiterated to patient necessity of adhering to medication regimen as he is at high risk for cardiac event with current co morbidities.  Continue to work along with CCM team.

## 2019-04-03 NOTE — Progress Notes (Addendum)
BP 140/88 (BP Location: Left Arm, Patient Position: Sitting)   Pulse 84   Temp 98.5 F (36.9 C) (Oral)   Ht 5\' 8"  (1.727 m)   Wt 255 lb (115.7 kg)   SpO2 92%   BMI 38.77 kg/m    Subjective:    Patient ID: [name], male    DOB: [birth year], 58 y.o.   MRN: [id number]  HPI: Rodney Dahan. is a 58 y.o. male  Chief Complaint  Patient presents with  . Diabetes  . Hypertension  . Hyperlipidemia  . Coagulation Disorder   DIABETES  Last A1C 8.9% in August, had trended down from 11.3%.   Stopped taking Metformin one month ago due to concerns about weight gain that he read online.  Continues on Tresiba, [place], Humalog, [place].  He does endorse being an "eater" and not following diabetic diet, works in [place].   Hypoglycemic episodes:no Polydipsia/polyuria: no Visual disturbance: no Chest pain: no Paresthesias: no Glucose Monitoring: yes  Accucheck frequency: BID  Fasting glucose: 250 range  Post prandial:  Evening: 200's  Before meals: Taking Insulin?: yes  Long acting insulin: Tresiba 25 units  Short acting insulin: 50 units twice a day Blood Pressure Monitoring: not checking Retinal Examination: Not up to Date Foot Exam: Up to Date Pneumovax: Up to Date Influenza: refused Aspirin: no   HYPERTENSION / HYPERLIPIDEMIA Continues on Lotensin, HCTZ, and Fenofibrate.  Has not seen cardiology since 2016.  Currently with no insurance. Satisfied with current treatment? yes Duration of hypertension: chronic BP monitoring frequency: not checking BP range:  BP medication side effects: no Duration of hyperlipidemia: chronic Cholesterol medication side effects: no Cholesterol supplements: none Medication compliance: good compliance Aspirin: no Recent stressors: no Recurrent headaches: no Visual changes: no Palpitations: no Dyspnea: no Chest pain: no Lower extremity edema: no Dizzy/lightheaded: no   ATRIAL FIBRILLATION WITH METALLIC VALVE Currently takes  Coumadin 10 mg once a day,alternate with 9 mg on Tuesday and Thursday. Sees cardiology, last saw Dr. Saturday in 2016.  Recent INR 2.8 on 03/06/19.   Atrial fibrillation status: stable Satisfied with current treatment: yes  Medication side effects:  no Medication compliance: good compliance Etiology of atrial fibrillation:  Palpitations:  no Chest pain:  no Dyspnea on exertion:  no Orthopnea:  no Syncope:  no Edema:  no Ventricular rate control: B-blocker Anti-coagulation: warfarin  Relevant past medical, surgical, family and social history reviewed and updated as indicated. Interim medical history since our last visit reviewed. Allergies and medications reviewed and updated.  Review of Systems  Constitutional: Negative for activity change, diaphoresis, fatigue and fever.  Respiratory: Negative for cough, chest tightness, shortness of breath and wheezing.   Cardiovascular: Negative for chest pain, palpitations and leg swelling.  Gastrointestinal: Negative for abdominal distention, abdominal pain, constipation, diarrhea, nausea and vomiting.  Endocrine: Negative for cold intolerance, heat intolerance, polydipsia, polyphagia and polyuria.  Neurological: Negative for dizziness, syncope, weakness, light-headedness, numbness and headaches.  Psychiatric/Behavioral: Negative.     Per HPI unless specifically indicated above     Objective:    BP 140/88 (BP Location: Left Arm, Patient Position: Sitting)   Pulse 84   Temp 98.5 F (36.9 C) (Oral)   Ht 5\' 8"  (1.727 m)   Wt 255 lb (115.7 kg)   SpO2 92%   BMI 38.77 kg/m   Wt Readings from Last 3 Encounters:  04/03/19 255 lb (115.7 kg)  02/06/19 255 lb (115.7 kg)  11/30/18 250 lb (113.4 kg)  Physical Exam Vitals signs and nursing note reviewed.  Constitutional:      General: He is awake. He is not in acute distress.    Appearance: He is well-developed. He is morbidly obese. He is not ill-appearing.  HENT:     Head: Normocephalic  and atraumatic.     Right Ear: Hearing normal. No drainage.     Left Ear: Hearing normal. No drainage.  Eyes:     General: Lids are normal.        Right eye: No discharge.        Left eye: No discharge.     Conjunctiva/sclera: Conjunctivae normal.     Pupils: Pupils are equal, round, and reactive to light.  Neck:     Musculoskeletal: Normal range of motion and neck supple.     Vascular: No carotid bruit.  Cardiovascular:     Rate and Rhythm: Normal rate. Rhythm irregularly irregular.     Heart sounds: Normal heart sounds, S1 normal and S2 normal. No murmur. No gallop.   Pulmonary:     Effort: Pulmonary effort is normal. No accessory muscle usage or respiratory distress.     Breath sounds: Normal breath sounds.  Abdominal:     General: Bowel sounds are normal.     Palpations: Abdomen is soft.  Musculoskeletal: Normal range of motion.     Right lower leg: No edema.     Left lower leg: No edema.  Skin:    General: Skin is warm and dry.  Neurological:     Mental Status: He is alert and oriented to person, place, and time.  Psychiatric:        Mood and Affect: Mood normal.        Behavior: Behavior normal. Behavior is cooperative.        Thought Content: Thought content normal.        Judgment: Judgment normal.    Diabetic Foot Exam - Simple   Simple Foot Form Visual Inspection See comments: Yes Sensation Testing See comments: Yes Pulse Check Posterior Tibialis and Dorsalis pulse intact bilaterally: Yes Comments Sensation right 7/10 and left 7/10.  Thickened toenails bilaterally and xerosis to feet.     Results for orders placed or performed in visit on 04/03/19  Bayer DCA Hb A1c Waived  Result Value Ref Range   HB A1C (BAYER DCA - WAIVED) 9.0 (H) <7.0 %  CoaguChek XS/INR Waived  Result Value Ref Range   INR 2.1 (H) 0.9 - 1.1   Prothrombin Time 25.7 sec      Assessment & Plan:   Problem List Items Addressed This Visit      Cardiovascular and Mediastinum    Chronic atrial fibrillation (HCC)    Chronic, ongoing. INR today 2.1.  Recommend he increase Coumadin to 10 MG every day, except Thursday to continue 9 MG.  Continue Carvedilol for rate control. Recommend he return yearly for cardiology visits.  Continue to work with CCM team.        Relevant Orders   CBC with Differential/Platelet out   CoaguChek XS/INR Waived (Completed)   Hypertension associated with type 2 diabetes mellitus ([place])    Chronic, ongoing with initial BP elevated but repeat improved.  Recommend he consistently take medication daily, continue current regimen.  Discussed benefit of modest weight loss for overall health.  Obtain CMP today.  Would consider addition of Amlodipine if BP above goal.  Recommend he check BP at home daily and document + focus on DASH  diet.      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  Have recommend he return to taking Metformin (refills sent) and educated him on this.  Continue Ozempic and [place].  Focus heavily on diabetic diet and modest weight loss, decreasing carb and sugar intake at home.  Continue to check BS TID.  Will continue to work with CCM team to ensure adherence to medication regimen, since self pay concern he will not take medication as instructed.  Return in 3 months.      Relevant Medications   metFORMIN (GLUCOPHAGE) 500 MG tablet   Other Relevant Orders   Bayer DCA Hb A1c Waived (Completed)   Hyperlipidemia associated with type 2 diabetes mellitus (HCC)   Relevant Medications   metFORMIN (GLUCOPHAGE) 500 MG tablet   Other Relevant Orders   Bayer DCA Hb A1c Waived (Completed)   Comprehensive metabolic panel   Lipid Panel w/o Chol/HDL Ratio out     Hematopoietic and Hemostatic   Acquired thrombophilia (HCC)    Monitor CBC  with use of daily Coumadin for mechanical valve and a-fib.  Denies any current bleeding episodes.        Other   S/P aortic valve replacement with metallic valve    INR 2.1 today.  Recommend he increase Coumadin to 10 MG every day, except Thursday to continue 9 MG.  Will have him return in one week for INR recheck.      Morbid obesity (HCC)    Recommend continued focus on health diet choices and regular physical activity (30 minutes 5 days a week).       Relevant Medications   metFORMIN (GLUCOPHAGE) 500 MG tablet   Noncompliance w/medication treatment due to intermit use of medication    Major concern and reiterated to patient necessity of adhering to medication regimen as he is at high risk for cardiac event with current co morbidities.  Continue to work along with CCM team.      Body mass index (BMI) 38.0-38.9, adult    With multiple co morbidities present.  Poor adherence to diabetic diet.  Recommend continued focus on health diet choices and regular physical activity (30 minutes 5 days a week).        Other Visit Diagnoses    B12 deficiency       Long term Metformin use and history of low levels.  Will check this today.   Relevant Orders   Vitamin B12       Follow up plan: Return in about 1 week (around 04/10/2019) for For INR check and then 3 months for T2DM, HTN/HLD.

## 2019-04-03 NOTE — Assessment & Plan Note (Signed)
Chronic, ongoing. INR today 2.1.  Recommend he increase Coumadin to 10 MG every day, except Thursday to continue 9 MG.  Continue Carvedilol for rate control. Recommend he return yearly for cardiology visits.  Continue to work with CCM team.

## 2019-04-03 NOTE — Patient Instructions (Signed)
Carbohydrate Counting for Diabetes Mellitus, Adult  Carbohydrate counting is a method of keeping track of how many carbohydrates you eat. Eating carbohydrates naturally increases the amount of sugar (glucose) in the blood. Counting how many carbohydrates you eat helps keep your blood glucose within normal limits, which helps you manage your diabetes (diabetes mellitus). It is important to know how many carbohydrates you can safely have in each meal. This is different for every person. A diet and nutrition specialist (registered dietitian) can help you make a meal plan and calculate how many carbohydrates you should have at each meal and snack. Carbohydrates are found in the following foods:  Grains, such as breads and cereals.  Dried beans and soy products.  Starchy vegetables, such as potatoes, peas, and corn.  Fruit and fruit juices.  Milk and yogurt.  Sweets and snack foods, such as cake, cookies, candy, chips, and soft drinks. How do I count carbohydrates? There are two ways to count carbohydrates in food. You can use either of the methods or a combination of both. Reading "Nutrition Facts" on packaged food The "Nutrition Facts" list is included on the labels of almost all packaged foods and beverages in the [place] It includes:  The serving size.  Information about nutrients in each serving, including the grams (g) of carbohydrate per serving. To use the "Nutrition Facts":  Decide how many servings you will have.  Multiply the number of servings by the number of carbohydrates per serving.  The resulting number is the total amount of carbohydrates that you will be having. Learning standard serving sizes of other foods When you eat carbohydrate foods that are not packaged or do not include "Nutrition Facts" on the label, you need to measure the servings in order to count the amount of carbohydrates:  Measure the foods that you will eat with a food scale or measuring cup, if needed.   Decide how many standard-size servings you will eat.  Multiply the number of servings by 15. Most carbohydrate-rich foods have about 15 g of carbohydrates per serving. ? For example, if you eat 8 oz (170 g) of strawberries, you will have eaten 2 servings and 30 g of carbohydrates (2 servings x 15 g = 30 g).  For foods that have more than one food mixed, such as soups and casseroles, you must count the carbohydrates in each food that is included. The following list contains standard serving sizes of common carbohydrate-rich foods. Each of these servings has about 15 g of carbohydrates:   hamburger bun or  English muffin.   oz (15 mL) syrup.   oz (14 g) jelly.  1 slice of bread.  1 six-inch tortilla.  3 oz (85 g) cooked rice or pasta.  4 oz (113 g) cooked dried beans.  4 oz (113 g) starchy vegetable, such as peas, corn, or potatoes.  4 oz (113 g) hot cereal.  4 oz (113 g) mashed potatoes or  of a large baked potato.  4 oz (113 g) canned or frozen fruit.  4 oz (120 mL) fruit juice.  4-6 crackers.  6 chicken nuggets.  6 oz (170 g) unsweetened dry cereal.  6 oz (170 g) plain fat-free yogurt or yogurt sweetened with artificial sweeteners.  8 oz (240 mL) milk.  8 oz (170 g) fresh fruit or one small piece of fruit.  24 oz (680 g) popped popcorn. Example of carbohydrate counting Sample meal  3 oz (85 g) chicken breast.  6 oz (170 g)   brown rice.  4 oz (113 g) corn.  8 oz (240 mL) milk.  8 oz (170 g) strawberries with sugar-free whipped topping. Carbohydrate calculation 1. Identify the foods that contain carbohydrates: ? Rice. ? Corn. ? Milk. ? Strawberries. 2. Calculate how many servings you have of each food: ? 2 servings rice. ? 1 serving corn. ? 1 serving milk. ? 1 serving strawberries. 3. Multiply each number of servings by 15 g: ? 2 servings rice x 15 g = 30 g. ? 1 serving corn x 15 g = 15 g. ? 1 serving milk x 15 g = 15 g. ? 1 serving  strawberries x 15 g = 15 g. 4. Add together all of the amounts to find the total grams of carbohydrates eaten: ? 30 g + 15 g + 15 g + 15 g = 75 g of carbohydrates total. Summary  Carbohydrate counting is a method of keeping track of how many carbohydrates you eat.  Eating carbohydrates naturally increases the amount of sugar (glucose) in the blood.  Counting how many carbohydrates you eat helps keep your blood glucose within normal limits, which helps you manage your diabetes.  A diet and nutrition specialist (registered dietitian) can help you make a meal plan and calculate how many carbohydrates you should have at each meal and snack. This information is not intended to replace advice given to you by your health care provider. Make sure you discuss any questions you have with your health care provider. Document Released: 04/25/2005 Document Revised: 11/17/2016 Document Reviewed: 10/07/2015 Elsevier Patient Education  2020 Elsevier Inc.  

## 2019-04-03 NOTE — Assessment & Plan Note (Signed)
Recommend continued focus on health diet choices and regular physical activity (30 minutes 5 days a week). 

## 2019-04-03 NOTE — Assessment & Plan Note (Addendum)
Chronic, ongoing with initial BP elevated but repeat improved.  Recommend he consistently take medication daily, continue current regimen.  Discussed benefit of modest weight loss for overall health.  Obtain CMP today.  Would consider addition of Amlodipine if BP above goal.  Recommend he check BP at home daily and document + focus on DASH diet.

## 2019-04-04 LAB — CBC WITH DIFFERENTIAL/PLATELET
Basophils Absolute: 0.1 10*3/uL (ref 0.0–0.2)
Basos: 1 %
EOS (ABSOLUTE): 0.1 10*3/uL (ref 0.0–0.4)
Eos: 2 %
Hematocrit: 46.5 % (ref 37.5–51.0)
Hemoglobin: 15.4 g/dL (ref 13.0–17.7)
Immature Grans (Abs): 0 10*3/uL (ref 0.0–0.1)
Immature Granulocytes: 0 %
Lymphocytes Absolute: 2 10*3/uL (ref 0.7–3.1)
Lymphs: 27 %
MCH: 27.2 pg (ref 26.6–33.0)
MCHC: 33.1 g/dL (ref 31.5–35.7)
MCV: 82 fL (ref 79–97)
Monocytes Absolute: 0.6 10*3/uL (ref 0.1–0.9)
Monocytes: 8 %
Neutrophils Absolute: 4.7 10*3/uL (ref 1.4–7.0)
Neutrophils: 62 %
Platelets: 198 10*3/uL (ref 150–450)
RBC: 5.67 x10E6/uL (ref 4.14–5.80)
RDW: 13.4 % (ref 11.6–15.4)
WBC: 7.5 10*3/uL (ref 3.4–10.8)

## 2019-04-04 LAB — LIPID PANEL W/O CHOL/HDL RATIO
Cholesterol, Total: 277 mg/dL — ABNORMAL HIGH (ref 100–199)
HDL: 29 mg/dL — ABNORMAL LOW (ref >39)
LDL Chol Calc (NIH): 108 mg/dL — ABNORMAL HIGH (ref 0–99)
Triglycerides: 788 mg/dL (ref 0–149)
VLDL Cholesterol Cal: 140 mg/dL — ABNORMAL HIGH (ref 5–40)

## 2019-04-04 LAB — VITAMIN B12: Vitamin B-12: 652 pg/mL (ref 232–1245)

## 2019-04-04 LAB — COMPREHENSIVE METABOLIC PANEL
ALT: 17 IU/L (ref 0–44)
AST: 14 IU/L (ref 0–40)
Albumin/Globulin Ratio: 1.3 (ref 1.2–2.2)
Albumin: 4.2 g/dL (ref 3.8–4.9)
Alkaline Phosphatase: 104 IU/L (ref 39–117)
BUN/Creatinine Ratio: 18 (ref 9–20)
BUN: 20 mg/dL (ref 6–24)
Bilirubin Total: 0.5 mg/dL (ref 0.0–1.2)
CO2: 23 mmol/L (ref 20–29)
Calcium: 9.2 mg/dL (ref 8.7–10.2)
Chloride: 96 mmol/L (ref 96–106)
Creatinine, Ser: 1.14 mg/dL (ref 0.76–1.27)
GFR calc Af Amer: 81 mL/min/{1.73_m2} (ref >59)
GFR calc non Af Amer: 70 mL/min/{1.73_m2} (ref >59)
Globulin, Total: 3.2 g/dL (ref 1.5–4.5)
Glucose: 236 mg/dL — ABNORMAL HIGH (ref 65–99)
Potassium: 3.7 mmol/L (ref 3.5–5.2)
Sodium: 137 mmol/L (ref 134–144)
Total Protein: 7.4 g/dL (ref 6.0–8.5)

## 2019-04-08 ENCOUNTER — Other Ambulatory Visit: Payer: Self-pay | Admitting: Nurse Practitioner

## 2019-04-08 MED ORDER — ATORVASTATIN CALCIUM 20 MG PO TABS: 20.0000 mg | ORAL_TABLET | Freq: Every day | ORAL | 3 refills | Status: DC

## 2019-04-12 ENCOUNTER — Ambulatory Visit: Payer: Self-pay | Admitting: Pharmacist

## 2019-04-12 ENCOUNTER — Ambulatory Visit: Payer: Self-pay | Admitting: Nurse Practitioner

## 2019-04-12 DIAGNOSIS — E785 Hyperlipidemia, unspecified: Secondary | ICD-10-CM

## 2019-04-12 DIAGNOSIS — E1169 Type 2 diabetes mellitus with other specified complication: Secondary | ICD-10-CM

## 2019-04-12 NOTE — Chronic Care Management (AMB) (Signed)
Chronic Care Management   Follow Up Note   04/12/2019 Name: Rodney ARREGUIN [name] MRN: [id number] DOB: Sep 10, 1960  Referred by: Steele Sizer, MD Reason for referral : Chronic Care Management (Medication Management)   Rodney Chary [name] is a 58 y.o. year old male who is a primary care patient of Crissman, Redge Gainer, MD. The CCM team was consulted for assistance with chronic disease management and care coordination needs.    Contacted patient for medication management review.   Review of patient status, including review of consultants reports, relevant laboratory and other test results, and collaboration with appropriate care team members and the patient's provider was performed as part of comprehensive patient evaluation and provision of chronic care management services.    SDOH (Social Determinants of Health) screening performed today: Financial Strain . See Care Plan for related entries.   Outpatient Encounter Medications as of 04/12/2019  Medication Sig Note  . dapagliflozin propanediol (FARXIGA) 10 MG TABS tablet Take 10 mg by mouth daily.   . insulin degludec (TRESIBA FLEXTOUCH) 100 UNIT/ML SOPN FlexTouch Pen Inject 0.25 mLs (25 Units total) into the skin daily. 04/12/2019: 24 units daily   . insulin lispro (HUMALOG) 100 UNIT/ML injection 50 units 2 times daily   . atorvastatin (LIPITOR) 20 MG tablet Take 1 tablet (20 mg total) by mouth daily. (Patient not taking: Reported on 04/12/2019)   . benazepril (LOTENSIN) 40 MG tablet Take 1 tablet (40 mg total) by mouth daily.   . carvedilol (COREG) 25 MG tablet Take 1 tablet (25 mg total) by mouth 2 (two) times daily with a meal.   . fenofibrate (TRICOR) 145 MG tablet Take 1 tablet (145 mg total) by mouth daily.   . Glucose Blood (BLOOD GLUCOSE TEST STRIPS) STRP 1 strip by In Vitro route daily.   . hydrochlorothiazide (HYDRODIURIL) 25 MG tablet Take 1 tablet (25 mg total) by mouth daily.   . metFORMIN (GLUCOPHAGE) 500 MG tablet TAKE 2 TABLETS BY  MOUTH TWICE A DAY (Patient not taking: Reported on 04/12/2019)   . Semaglutide, 1 MG/DOSE, (OZEMPIC, 1 MG/DOSE,) 2 MG/1.5ML SOPN Inject 1 mg into the skin once a week. (Patient not taking: Reported on 04/12/2019)   . warfarin (COUMADIN) 10 MG tablet Take 1 tablet (10 mg total) by mouth daily. Take as directed   . warfarin (COUMADIN) 4 MG tablet Take 1 tablet (4 mg total) by mouth as directed.   . warfarin (COUMADIN) 5 MG tablet Take 1 tablet (5 mg total) by mouth as directed.    No facility-administered encounter medications on file as of 04/12/2019.      Goals Addressed            This Visit's Progress     Patient Stated   . "I can't afford my medications" (pt-stated)       Current Barriers:  . Diabetes: uncontrolled; complicated by chronic medical conditions including HTN, HLD, afib, most recent A1c 9.0% o Notes that he has not been able to complete [facility] paperwork because he has not found his 2019 tax return yet.  . Current antihyperglycemic regimen: Tresiba 24 units QAM, Novolog 50 units BID (though eats 3 meals daily), Farxiga 10 mg daily, Ozempic 1 mg weekly - has run out of this medication, metformin 1000 mg BID - has not picked up this medication . Reports polydipsia and polyuria  . Current blood glucose readings:  o Fastings: 280-300s o Sporadic readings: 200s  . Cardiovascular risk reduction: o  Current hypertensive regimen: benazepril 40 mg daily, carvedilol 25 mg BID, HCTZ 25 mg daily  o Current hyperlipidemia regimen: prescribed atorvastatin, though has not picked it up yet ; fenofibrate 145 mg daily o Current antiplatelet regimen: none; warfarin for afib + valve replacement  Pharmacist Clinical Goal(s):  Marland Kitchen Over the next 90 days, patient will work with PharmD and primary care provider to address optimized glycemic control  Interventions: . Comprehensive medication review performed, medication list updated in electronic medical record . Stressed the  importance of medication access. Implored that he make locating his tax return a priority and provide this to [facility] so that he can obtain his medications.  . Reviewed that he should have mealtime insulin with each meal, if he's eating 3 similar sized meals daily. Fasting sugars are also significantly elevated. Collaborated w/ Jolene Cannady; increase Tresiba to 30 units daily and adjust Novolog to 30 units TID with meals. Educate to skip Novolog if he skips a meal. Patient verbalized understanding  Patient Self Care Activities:  . Patient will check blood glucose BID , document, and provide at future appointments . Patient will take medications as prescribed . Patient will report any questions or concerns to provider   Please see past updates related to this goal by clicking on the "Past Updates" button in the selected goal          Plan: - Will outreach patient for continued medication management support in the next 3-4 weeks  Catie Darnelle Maffucci, PharmD, [place] [phone]

## 2019-04-12 NOTE — Patient Instructions (Signed)
Visit Information  Goals Addressed            This Visit's Progress     Patient Stated   . "I can't afford my medications" (pt-stated)       Current Barriers:  . Diabetes: uncontrolled; complicated by chronic medical conditions including HTN, HLD, afib, most recent A1c 9.0% o Notes that he has not been able to complete [facility] paperwork because he has not found his 2019 tax return yet.  . Current antihyperglycemic regimen: Tresiba 24 units QAM, Novolog 50 units BID (though eats 3 meals daily), Farxiga 10 mg daily, Ozempic 1 mg weekly - has run out of this medication, metformin 1000 mg BID - has not picked up this medication . Reports polydipsia and polyuria  . Current blood glucose readings:  o Fastings: 280-300s o Sporadic readings: 200s  . Cardiovascular risk reduction: o Current hypertensive regimen: benazepril 40 mg daily, carvedilol 25 mg BID, HCTZ 25 mg daily  o Current hyperlipidemia regimen: prescribed atorvastatin, though has not picked it up yet ; fenofibrate 145 mg daily o Current antiplatelet regimen: none; warfarin for afib + valve replacement  Pharmacist Clinical Goal(s):  Marland Kitchen Over the next 90 days, patient will work with PharmD and primary care provider to address optimized glycemic control  Interventions: . Comprehensive medication review performed, medication list updated in electronic medical record . Stressed the importance of medication access. Implored that he make locating his tax return a priority and provide this to [facility] so that he can obtain his medications.  . Reviewed that he should have mealtime insulin with each meal, if he's eating 3 similar sized meals daily. Fasting sugars are also significantly elevated. Collaborated w/ Jolene Cannady; increase Tresiba to 30 units daily and adjust Novolog to 30 units TID with meals. Educate to skip Novolog if he skips a meal. Patient verbalized understanding  Patient Self Care Activities:  . Patient  will check blood glucose BID , document, and provide at future appointments . Patient will take medications as prescribed . Patient will report any questions or concerns to provider   Please see past updates related to this goal by clicking on the "Past Updates" button in the selected goal         The patient verbalized understanding of instructions provided today and declined a print copy of patient instruction materials.   Plan: - Will outreach patient for continued medication management support in the next 3-4 weeks  Catie Darnelle Maffucci, PharmD, [place] [phone]

## 2019-04-16 ENCOUNTER — Other Ambulatory Visit: Payer: Self-pay

## 2019-04-17 ENCOUNTER — Encounter: Payer: Self-pay | Admitting: Nurse Practitioner

## 2019-04-17 ENCOUNTER — Ambulatory Visit (INDEPENDENT_AMBULATORY_CARE_PROVIDER_SITE_OTHER): Payer: Self-pay | Admitting: Nurse Practitioner

## 2019-04-17 ENCOUNTER — Other Ambulatory Visit: Payer: Self-pay

## 2019-04-17 VITALS — BP 161/96 | HR 82 | Temp 98.7°F

## 2019-04-17 DIAGNOSIS — Z954 Presence of other heart-valve replacement: Secondary | ICD-10-CM

## 2019-04-17 LAB — COAGUCHEK XS/INR WAIVED
INR: 2 — ABNORMAL HIGH (ref 0.9–1.1)
Prothrombin Time: 24.4 s

## 2019-04-17 NOTE — Patient Instructions (Signed)
Vitamin K Foods and Warfarin Warfarin is a blood thinner (anticoagulant). Anticoagulant medicines help prevent the formation of blood clots. These medicines work by decreasing the activity of vitamin K, which promotes normal blood clotting. When you take warfarin, problems can occur from suddenly increasing or decreasing the amount of vitamin K that you eat from one day to the next. Problems may include:  Blood clots.  Bleeding. What general guidelines do I need to follow? To avoid problems when taking warfarin:  Eat a balanced diet that includes: ? Fresh fruits and vegetables. ? Whole grains. ? Low-fat dairy products. ? Lean proteins, such as fish, eggs, and lean cuts of meat.  Keep your intake of vitamin K consistent from day to day. To do this: ? Avoid eating large amounts of vitamin K one day and low amounts of vitamin K the next day. ? If you take a multivitamin that contains vitamin K, be sure to take it every day. ? Know which foods contain vitamin K. Use the lists below to understand serving sizes and the amount of vitamin K in one serving.  Avoid major changes in your diet. If you are going to change your diet, talk with your health care provider before making changes.  Work with a nutrition specialist (dietitian) to develop a meal plan that works best for you.  High vitamin K foods Foods that are high in vitamin K contain more than 100 mcg (micrograms) per serving. These include:  Broccoli (cooked) -  cup has 110 mcg.  Brussels sprouts (cooked) -  cup has 109 mcg.  Greens, beet (cooked) -  cup has 350 mcg.  Greens, collard (cooked) -  cup has 418 mcg.  Greens, turnip (cooked) -  cup has 265 mcg.  Green onions or scallions -  cup has 105 mcg.  Kale (fresh or frozen) -  cup has 531 mcg.  Parsley (raw) - 10 sprigs has 164 mcg.  Spinach (cooked) -  cup has 444 mcg.  Swiss chard (cooked) -  cup has 287 mcg. Moderate vitamin K foods Foods that have a  moderate amount of vitamin K contain 25-100 mcg per serving. These include:  Asparagus (cooked) - 5 spears have 38 mcg.  Black-eyed peas (dried) -  cup has 32 mcg.  Cabbage (cooked) -  cup has 37 mcg.  Kiwi fruit - 1 medium has 31 mcg.  Lettuce - 1 cup has 57-63 mcg.  Okra (frozen) -  cup has 44 mcg.  Prunes (dried) - 5 prunes have 25 mcg.  Watercress (raw) - 1 cup has 85 mcg. Low vitamin K foods Foods low in vitamin K contain less than 25 mcg per serving. These include:  Artichoke - 1 medium has 18 mcg.  Avocado - 1 oz. has 6 mcg.  Blueberries -  cup has 14 mcg.  Cabbage (raw) -  cup has 21 mcg.  Carrots (cooked) -  cup has 11 mcg.  Cauliflower (raw) -  cup has 11 mcg.  Cucumber with peel (raw) -  cup has 9 mcg.  Grapes -  cup has 12 mcg.  Mango - 1 medium has 9 mcg.  Nuts - 1 oz. has 15 mcg.  Pear - 1 medium has 8 mcg.  Peas (cooked) -  cup has 19 mcg.  Pickles - 1 spear has 14 mcg.  Pumpkin seeds - 1 oz. has 13 mcg.  Sauerkraut (canned) -  cup has 16 mcg.  Soybeans (cooked) -  cup has 16 mcg.    Tomato (raw) - 1 medium has 10 mcg.  Tomato sauce -  cup has 17 mcg. Vitamin K-free foods If a food contain less than 5 mcg per serving, it is considered to have no vitamin K. These foods include:  Bread and cereal products.  Cheese.  Eggs.  Fish and shellfish.  Meat and poultry.  Milk and dairy products.  Sunflower seeds. Actual amounts of vitamin K in foods may be different depending on processing. Talk with your dietitian about what foods you can eat and what foods you should avoid. This information is not intended to replace advice given to you by your health care provider. Make sure you discuss any questions you have with your health care provider. Document Released: 02/20/2009 Document Revised: 04/07/2017 Document Reviewed: 07/29/2015 Elsevier Patient Education  2020 Elsevier Inc.  

## 2019-04-17 NOTE — Progress Notes (Signed)
BP (!) 161/96   Pulse 82   Temp 98.7 F (37.1 C) (Oral)   SpO2 95%    Subjective:    Patient ID: Rodney Chary [name], male    DOB: [birth year], 59 y.o.   MRN: [id number]  CC: Coumadin management  HPI: This patient is a 58 y.o. male who presents for coumadin management. The expected duration of coumadin treatment is lifelong The reason for anticoagulation is  mechanical heart valve.  Last INR 2.1 and medication change made, recent INR 1.9.  He reports taking medication daily, however he has not handed all paperwork into medical management pharmacy and is not able to obtain medication there at this time, so unsure consistently taking.  CCM PharmD has discussed with patient about need to hand in paperwork and this was reiterated today by PCP.  Present Coumadin dose: Coumadin to 10 MG every day, except Thursday to continue 9 MG Goal: 2.5-3.5 The patient does not have an active anticoagulation episode. Excessive bruising: no Nose bleeding: no Rectal bleeding: no Prolonged menstrual cycles: N/A Eating diet with consistent amounts of foods containing Vitamin K:he reports he needs to cut back, has been eating more Any recent antibiotic use? no  Relevant past medical, surgical, family and social history reviewed and updated as indicated. Interim medical history since our last visit reviewed. Allergies and medications reviewed and updated.  ROS: Per HPI unless specifically indicated above     Objective:    BP (!) 161/96   Pulse 82   Temp 98.7 F (37.1 C) (Oral)   SpO2 95%   Wt Readings from Last 3 Encounters:  04/03/19 255 lb (115.7 kg)  02/06/19 255 lb (115.7 kg)  11/30/18 250 lb (113.4 kg)     General: Well appearing, well nourished in no distress.  Normal mood and affect. Skin: No excessive bruising or rash  Last INR: 2.1    Last CBC:  Lab Results  Component Value Date   WBC 7.5 04/03/2019   HGB 15.4 04/03/2019   HCT 46.5 04/03/2019   MCV 82 04/03/2019   PLT 198  04/03/2019    Results for orders placed or performed in visit on 04/03/19  Bayer DCA Hb A1c Waived  Result Value Ref Range   HB A1C (BAYER DCA - WAIVED) 9.0 (H) <7.0 %  Comprehensive metabolic panel  Result Value Ref Range   Glucose 236 (H) 65 - 99 mg/dL   BUN 20 6 - 24 mg/dL   Creatinine, Ser 4.12 0.76 - 1.27 mg/dL   GFR calc non Af Amer 70 >59 mL/min/1.73   GFR calc Af Amer 81 >59 mL/min/1.73   BUN/Creatinine Ratio 18 9 - 20   Sodium 137 134 - 144 mmol/L   Potassium 3.7 3.5 - 5.2 mmol/L   Chloride 96 96 - 106 mmol/L   CO2 23 20 - 29 mmol/L   Calcium 9.2 8.7 - 10.2 mg/dL   Total Protein 7.4 6.0 - 8.5 g/dL   Albumin 4.2 3.8 - 4.9 g/dL   Globulin, Total 3.2 1.5 - 4.5 g/dL   Albumin/Globulin Ratio 1.3 1.2 - 2.2   Bilirubin Total 0.5 0.0 - 1.2 mg/dL   Alkaline Phosphatase 104 39 - 117 IU/L   AST 14 0 - 40 IU/L   ALT 17 0 - 44 IU/L  CBC with Differential/Platelet out  Result Value Ref Range   WBC 7.5 3.4 - 10.8 x10E3/uL   RBC 5.67 4.14 - 5.80 x10E6/uL   Hemoglobin 15.4 13.0 -  17.7 g/dL   Hematocrit 46.5 37.5 - 51.0 %   MCV 82 79 - 97 fL   MCH 27.2 26.6 - 33.0 pg   MCHC 33.1 31.5 - 35.7 g/dL   RDW 13.4 11.6 - 15.4 %   Platelets 198 150 - 450 x10E3/uL   Neutrophils 62 Not Estab. %   Lymphs 27 Not Estab. %   Monocytes 8 Not Estab. %   Eos 2 Not Estab. %   Basos 1 Not Estab. %   Neutrophils Absolute 4.7 1.4 - 7.0 x10E3/uL   Lymphocytes Absolute 2.0 0.7 - 3.1 x10E3/uL   Monocytes Absolute 0.6 0.1 - 0.9 x10E3/uL   EOS (ABSOLUTE) 0.1 0.0 - 0.4 x10E3/uL   Basophils Absolute 0.1 0.0 - 0.2 x10E3/uL   Immature Granulocytes 0 Not Estab. %   Immature Grans (Abs) 0.0 0.0 - 0.1 x10E3/uL  Lipid Panel w/o Chol/HDL Ratio out  Result Value Ref Range   Cholesterol, Total 277 (H) 100 - 199 mg/dL   Triglycerides 788 (HH) 0 - 149 mg/dL   HDL 29 (L) >39 mg/dL   VLDL Cholesterol Cal 140 (H) 5 - 40 mg/dL   LDL Chol Calc (NIH) 108 (H) 0 - 99 mg/dL  Vitamin B12  Result Value Ref Range    Vitamin B-12 652 232 - 1,245 pg/mL  CoaguChek XS/INR Waived  Result Value Ref Range   INR 2.1 (H) 0.9 - 1.1   Prothrombin Time 25.7 sec       Assessment:     ICD-10-CM   1. S/P aortic valve replacement with metallic valve  N46.2 CoaguChek XS/INR Waived-H    Plan:   Discussed current plan face-to-face with patient. For coumadin dosing, elected to change dose to 10 MG Coumadin daily. Will plan to recheck INR in 1 week. He reports he was on 10 MG daily for several years, but they cut it back due to high INR awhile back.

## 2019-04-17 NOTE — Assessment & Plan Note (Addendum)
INR 1.9 today.  Recommend he increase Coumadin to 10 MG every day.  Will have him return in one week for INR recheck.  Discussed importance of getting all necessary records into medication management pharmacy to ensure ongoing assistance.

## 2019-04-24 ENCOUNTER — Other Ambulatory Visit: Payer: Self-pay

## 2019-04-24 ENCOUNTER — Ambulatory Visit: Payer: Self-pay | Admitting: Family Medicine

## 2019-04-24 MED ORDER — WARFARIN SODIUM 10 MG PO TABS: 10.0000 mg | ORAL_TABLET | Freq: Every day | ORAL | 1 refills | Status: DC

## 2019-04-25 ENCOUNTER — Encounter: Payer: Self-pay | Admitting: Family Medicine

## 2019-04-25 ENCOUNTER — Other Ambulatory Visit: Payer: Self-pay

## 2019-04-25 ENCOUNTER — Ambulatory Visit (INDEPENDENT_AMBULATORY_CARE_PROVIDER_SITE_OTHER): Payer: Self-pay | Admitting: Family Medicine

## 2019-04-25 VITALS — BP 159/102 | HR 82 | Temp 98.1°F | Wt 253.0 lb

## 2019-04-25 DIAGNOSIS — I482 Chronic atrial fibrillation, unspecified: Secondary | ICD-10-CM

## 2019-04-25 DIAGNOSIS — D6869 Other thrombophilia: Secondary | ICD-10-CM

## 2019-04-25 DIAGNOSIS — Z954 Presence of other heart-valve replacement: Secondary | ICD-10-CM

## 2019-04-25 LAB — COAGUCHEK XS/INR WAIVED
INR: 2.1 — ABNORMAL HIGH (ref 0.9–1.1)
Prothrombin Time: 25.2 s

## 2019-04-25 MED ORDER — WARFARIN SODIUM 2 MG PO TABS: ORAL_TABLET | ORAL | 0 refills | Status: DC

## 2019-04-25 NOTE — Progress Notes (Signed)
   BP (!) 159/102   Pulse 82   Temp 98.1 F (36.7 C) (Oral)   Wt 253 lb (114.8 kg)   SpO2 95%   BMI 38.47 kg/m    Subjective:    Patient ID: Rodney Plumber [name], male    DOB: Mar 28, 1961, 59 y.o.   MRN: [id number]  HPI: Rodney Snelling. is a 58 y.o. male  Chief Complaint  Patient presents with  . Coagulation Disorder   Patient presenting today for 1 week INR f/u due to subtherapeutic INR. Has been taking 10 mg coumadin daily and tolerating well. Diet stable no new medications, no bleeding or bruising issues, CP, SOB.   Relevant past medical, surgical, family and social history reviewed and updated as indicated. Interim medical history since our last visit reviewed. Allergies and medications reviewed and updated.  Review of Systems  Per HPI unless specifically indicated above     Objective:    BP (!) 159/102   Pulse 82   Temp 98.1 F (36.7 C) (Oral)   Wt 253 lb (114.8 kg)   SpO2 95%   BMI 38.47 kg/m   Wt Readings from Last 3 Encounters:  04/25/19 253 lb (114.8 kg)  04/03/19 255 lb (115.7 kg)  02/06/19 255 lb (115.7 kg)    Physical Exam Vitals and nursing note reviewed.  Constitutional:      Appearance: Normal appearance.  HENT:     Head: Atraumatic.  Eyes:     Extraocular Movements: Extraocular movements intact.     Conjunctiva/sclera: Conjunctivae normal.  Cardiovascular:     Rate and Rhythm: Normal rate.  Pulmonary:     Effort: Pulmonary effort is normal.     Breath sounds: Normal breath sounds.  Musculoskeletal:        General: Normal range of motion.     Cervical back: Normal range of motion and neck supple.  Skin:    General: Skin is warm and dry.  Neurological:     General: No focal deficit present.     Mental Status: He is oriented to person, place, and time.  Psychiatric:        Mood and Affect: Mood normal.        Thought Content: Thought content normal.        Judgment: Judgment normal.     Results for orders placed or performed in  visit on 04/25/19  CoaguChek XS/INR Waived (STAT)  Result Value Ref Range   INR 2.1 (H) 0.9 - 1.1   Prothrombin Time 25.2 sec      Assessment & Plan:   Problem List Items Addressed This Visit      Cardiovascular and Mediastinum   Chronic atrial fibrillation (HCC)   Relevant Medications   warfarin (COUMADIN) 2 MG tablet     Hematopoietic and Hemostatic   Acquired thrombophilia ([place])   Relevant Orders   CoaguChek XS/INR Waived (STAT) (Completed)     Other   S/P aortic valve replacement with metallic valve - Primary    INR still subtherapeutic, 2.1 today. Will increase coumadin dose to 12 mg on T and Th and continue 10 mg daily all other days. Recheck in 2 weeks          Follow up plan: Return in about 2 weeks (around 05/09/2019) for INR.

## 2019-04-29 NOTE — Assessment & Plan Note (Signed)
INR still subtherapeutic, 2.1 today. Will increase coumadin dose to 12 mg on T and Th and continue 10 mg daily all other days. Recheck in 2 weeks

## 2019-04-30 ENCOUNTER — Other Ambulatory Visit: Payer: Self-pay | Admitting: Family Medicine

## 2019-04-30 DIAGNOSIS — I10 Essential (primary) hypertension: Secondary | ICD-10-CM

## 2019-04-30 DIAGNOSIS — E1169 Type 2 diabetes mellitus with other specified complication: Secondary | ICD-10-CM

## 2019-04-30 DIAGNOSIS — E119 Type 2 diabetes mellitus without complications: Secondary | ICD-10-CM

## 2019-04-30 MED ORDER — BENAZEPRIL HCL 40 MG PO TABS: 40.0000 mg | ORAL_TABLET | Freq: Every day | ORAL | 3 refills | Status: DC

## 2019-04-30 MED ORDER — FENOFIBRATE 145 MG PO TABS: 145.0000 mg | ORAL_TABLET | Freq: Every day | ORAL | 3 refills | Status: DC

## 2019-04-30 MED ORDER — HYDROCHLOROTHIAZIDE 25 MG PO TABS: 25.0000 mg | ORAL_TABLET | Freq: Every day | ORAL | 3 refills | Status: DC

## 2019-04-30 MED ORDER — DAPAGLIFLOZIN PROPANEDIOL 10 MG PO TABS: 10.0000 mg | ORAL_TABLET | Freq: Every day | ORAL | 3 refills | Status: DC

## 2019-04-30 NOTE — Telephone Encounter (Signed)
Copied from [place]. Topic: Quick Communication - Rx Refill/Question >> Apr 30, 2019  1:38 PM Rainey Pines A wrote: Medication: Patient requesting refill on all medications that are in pill form  Has the patient contacted their pharmacy?Yes (Agent: If no, request that the patient contact the pharmacy for the refill.) (Agent: If yes, when and what did the pharmacy advise?)Contact Pcp  Preferred Pharmacy (with phone number or street name): [id number] - [place], [address]  Phone:  [phone] Fax:  [phone]     Agent: Please be advised that RX refills may take up to 3 business days. We ask that you follow-up with your pharmacy.

## 2019-05-01 ENCOUNTER — Telehealth: Payer: Self-pay

## 2019-05-01 ENCOUNTER — Telehealth: Payer: Self-pay | Admitting: Pharmacy Technician

## 2019-05-01 ENCOUNTER — Ambulatory Visit: Payer: Self-pay | Admitting: Pharmacist

## 2019-05-01 NOTE — Telephone Encounter (Signed)
Patient failed to provide requested 2019 tax return.  No additional medication assistance will be provided by [facility] without the required proof of income documentation.  Patient notified by letter.  Analisse Randle J. Billey Wojciak Care Manager [facility] 

## 2019-05-01 NOTE — Chronic Care Management (AMB) (Signed)
  Chronic Care Management   Note  05/01/2019 Name: Rodney WINSETT [name] MRN: [id number] DOB: 12-23-1960  Rodney Plumber [name] is a 58 y.o. year old male who is a primary care patient of Crissman, Jeannette How, MD. The CCM team was consulted for assistance with chronic disease management and care coordination needs.    Contacted patient to follow up on medication management needs. 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 He noted he was busy at work today before the holidays.   Will collaborate w/ Care Guide to reschedule phone call with me.   Catie Darnelle Maffucci, PharmD, [place] [phone]

## 2019-05-06 ENCOUNTER — Telehealth: Payer: Self-pay | Admitting: Family Medicine

## 2019-05-06 NOTE — Chronic Care Management (AMB) (Signed)
  Care Management   Outreach Note  05/06/2019 Name: Rodney Dixon. MRN: [id number] DOB: [birth year]  Referred by: Guadalupe Maple, MD Reason for referral : Care Coordination (Initial CCM outreach to r/s follow up was unsuccessful.)   First unsuccessful outreach attempt was made today to re-schedule follow up appointment with care management team member.   Follow Up Plan: The care management team will reach out to the patient again over the next 7 days.   [place], [place] [id number] Direct Dial: [email or web address]  Website: [email or web address]

## 2019-05-07 ENCOUNTER — Other Ambulatory Visit: Payer: Self-pay | Admitting: Nurse Practitioner

## 2019-05-07 ENCOUNTER — Telehealth: Payer: Self-pay | Admitting: Family Medicine

## 2019-05-07 DIAGNOSIS — E1169 Type 2 diabetes mellitus with other specified complication: Secondary | ICD-10-CM

## 2019-05-07 DIAGNOSIS — I10 Essential (primary) hypertension: Secondary | ICD-10-CM

## 2019-05-07 MED ORDER — CARVEDILOL 25 MG PO TABS: 25.0000 mg | ORAL_TABLET | Freq: Two times a day (BID) | ORAL | 3 refills | Status: DC

## 2019-05-07 MED ORDER — METFORMIN HCL 500 MG PO TABS: ORAL_TABLET | ORAL | 3 refills | Status: DC

## 2019-05-07 NOTE — Telephone Encounter (Signed)
Sent to medication management . 

## 2019-05-07 NOTE — Telephone Encounter (Signed)
Routing to provider  

## 2019-05-07 NOTE — Telephone Encounter (Signed)
Called pt to go over script screening, pt states that he needs a refill on his metformin and coreg. Pt has an appt scheduled tomorrow. Please advise.

## 2019-05-08 ENCOUNTER — Ambulatory Visit (INDEPENDENT_AMBULATORY_CARE_PROVIDER_SITE_OTHER): Payer: Self-pay | Admitting: Family Medicine

## 2019-05-08 ENCOUNTER — Other Ambulatory Visit: Payer: Self-pay

## 2019-05-08 ENCOUNTER — Encounter: Payer: Self-pay | Admitting: Family Medicine

## 2019-05-08 VITALS — BP 160/94 | HR 91 | Temp 98.5°F | Ht 68.0 in | Wt 252.0 lb

## 2019-05-08 DIAGNOSIS — E119 Type 2 diabetes mellitus without complications: Secondary | ICD-10-CM

## 2019-05-08 DIAGNOSIS — Z954 Presence of other heart-valve replacement: Secondary | ICD-10-CM

## 2019-05-08 DIAGNOSIS — E1169 Type 2 diabetes mellitus with other specified complication: Secondary | ICD-10-CM

## 2019-05-08 DIAGNOSIS — I482 Chronic atrial fibrillation, unspecified: Secondary | ICD-10-CM

## 2019-05-08 DIAGNOSIS — I10 Essential (primary) hypertension: Secondary | ICD-10-CM

## 2019-05-08 LAB — COAGUCHEK XS/INR WAIVED
INR: 2.6 — ABNORMAL HIGH (ref 0.9–1.1)
Prothrombin Time: 31.6 s

## 2019-05-08 MED ORDER — METFORMIN HCL 500 MG PO TABS: ORAL_TABLET | ORAL | 3 refills | Status: DC

## 2019-05-08 MED ORDER — HYDROCHLOROTHIAZIDE 25 MG PO TABS: 25.0000 mg | ORAL_TABLET | Freq: Every day | ORAL | 3 refills | Status: DC

## 2019-05-08 MED ORDER — CARVEDILOL 25 MG PO TABS: 25.0000 mg | ORAL_TABLET | Freq: Two times a day (BID) | ORAL | 3 refills | Status: DC

## 2019-05-08 MED ORDER — BENAZEPRIL HCL 40 MG PO TABS: 40.0000 mg | ORAL_TABLET | Freq: Every day | ORAL | 3 refills | Status: DC

## 2019-05-08 MED ORDER — DAPAGLIFLOZIN PROPANEDIOL 10 MG PO TABS: 10.0000 mg | ORAL_TABLET | Freq: Every day | ORAL | 3 refills | Status: DC

## 2019-05-08 MED ORDER — ATORVASTATIN CALCIUM 20 MG PO TABS: 20.0000 mg | ORAL_TABLET | Freq: Every day | ORAL | 3 refills | Status: DC

## 2019-05-08 MED ORDER — FENOFIBRATE 145 MG PO TABS: 145.0000 mg | ORAL_TABLET | Freq: Every day | ORAL | 3 refills | Status: DC

## 2019-05-08 MED ORDER — OZEMPIC (1 MG/DOSE) 2 MG/1.5ML ~~LOC~~ SOPN: 1.0000 mg | PEN_INJECTOR | SUBCUTANEOUS | 4 refills | Status: DC

## 2019-05-08 NOTE — Chronic Care Management (AMB) (Signed)
  Care Management   Outreach Note  05/08/2019 Name: Rodney Dixon [name] MRN: [id number] DOB: 1961/01/15  Referred by: Guadalupe Maple, MD Reason for referral : Care Coordination (Initial CM outreach to r/s follow up was unsuccessful.) and Care Coordination (Second CM outreach to r/s follow up was unsuccessful.)   A second unsuccessful telephone outreach was attempted today. The patient was referred to the case management team for assistance with care management and care coordination.   Follow Up Plan: The care management team will reach out to the patient again over the next 7 days.   [place], [place] [phone] Direct Dial: [email or web address]  Website: [email or web address]

## 2019-05-08 NOTE — Chronic Care Management (AMB) (Signed)
  Care Management   Note  05/08/2019 Name: [name] MRN: [id number] DOB: Oct 07, 1960  [name] is a 58 y.o. year old male who is a primary care patient of Crissman, Jeannette How, MD and is actively engaged with the care management team. I reached out to [place]. by phone today to assist with re-scheduling a follow up appointment with the Pharmacist  Follow up plan: Telephone appointment with CCM team member scheduled for: 06/07/2019  [place], [facility]  [place], [place] [address] Direct Dial: [email or web address]  Website: [email or web address]

## 2019-05-08 NOTE — Progress Notes (Signed)
BP (!) 160/94   Pulse 91   Temp 98.5 F (36.9 C) (Oral)   Ht 5\' 8"  (1.727 m)   Wt 252 lb (114.3 kg)   SpO2 95%   BMI 38.32 kg/m    Subjective:    Patient ID: [name], male    DOB: December 01, 1960, 58 y.o.   MRN: [id number]  HPI: Rodney Dixon. is a 58 y.o. male  Chief Complaint  Patient presents with  . Coagulation Disorder   Here today for 2 week INR f/u after having subtherapeutic INR at 2.1 last visit. Taking 10 mgs coumadin daily other than 12 mg Tuesdays and Fridays. Tolerating this dose well, no bleeding or bruising issues, CP, SOB, palpitations. Diet stable, no new medications.   Relevant past medical, surgical, family and social history reviewed and updated as indicated. Interim medical history since our last visit reviewed. Allergies and medications reviewed and updated.  Review of Systems  Per HPI unless specifically indicated above     Objective:    BP (!) 160/94   Pulse 91   Temp 98.5 F (36.9 C) (Oral)   Ht 5\' 8"  (1.727 m)   Wt 252 lb (114.3 kg)   SpO2 95%   BMI 38.32 kg/m   Wt Readings from Last 3 Encounters:  05/08/19 252 lb (114.3 kg)  04/25/19 253 lb (114.8 kg)  04/03/19 255 lb (115.7 kg)    Physical Exam Vitals and nursing note reviewed.  Constitutional:      Appearance: Normal appearance.  HENT:     Head: Atraumatic.  Eyes:     Extraocular Movements: Extraocular movements intact.     Conjunctiva/sclera: Conjunctivae normal.  Cardiovascular:     Rate and Rhythm: Normal rate.  Pulmonary:     Effort: Pulmonary effort is normal.     Breath sounds: Normal breath sounds.  Musculoskeletal:        General: Normal range of motion.     Cervical back: Normal range of motion and neck supple.  Skin:    General: Skin is warm and dry.  Neurological:     General: No focal deficit present.     Mental Status: He is oriented to person, place, and time.  Psychiatric:        Mood and Affect: Mood normal.        Thought Content: Thought  content normal.        Judgment: Judgment normal.     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 Recheck 1 month      Relevant Orders   CoaguChek XS/INR Waived (STAT) (Completed)    Other Visit Diagnoses    Essential hypertension       Relevant Medications   atorvastatin (LIPITOR) 20 MG tablet   benazepril (LOTENSIN) 40 MG tablet   carvedilol (COREG) 25 MG tablet   fenofibrate (TRICOR) 145 MG tablet   hydrochlorothiazide (HYDRODIURIL) 25 MG tablet   Diabetes mellitus without complication (HCC)       Relevant Medications   atorvastatin (LIPITOR) 20 MG tablet   benazepril (LOTENSIN) 40 MG tablet   dapagliflozin propanediol (FARXIGA) 10 MG TABS tablet   metFORMIN (GLUCOPHAGE) 500 MG tablet   Semaglutide, 1 MG/DOSE, (OZEMPIC, 1 MG/DOSE,) 2 MG/1.5ML SOPN       Follow up plan: Return in about 4 weeks (around 06/05/2019) for INR with Jolene.

## 2019-05-14 NOTE — Assessment & Plan Note (Signed)
INR at goal today at 2.6, will continue current regimen of 10 mg daily other than T and Fr at 12 mg coumadin. Recheck 1 month

## 2019-05-28 ENCOUNTER — Ambulatory Visit: Payer: Self-pay | Admitting: Nurse Practitioner

## 2019-05-28 ENCOUNTER — Other Ambulatory Visit: Payer: Self-pay

## 2019-06-04 ENCOUNTER — Ambulatory Visit (INDEPENDENT_AMBULATORY_CARE_PROVIDER_SITE_OTHER): Payer: 59 | Admitting: Nurse Practitioner

## 2019-06-04 ENCOUNTER — Other Ambulatory Visit: Payer: Self-pay

## 2019-06-04 ENCOUNTER — Encounter: Payer: Self-pay | Admitting: Nurse Practitioner

## 2019-06-04 ENCOUNTER — Telehealth: Payer: Self-pay | Admitting: Pharmacist

## 2019-06-04 VITALS — BP 153/90 | HR 99 | Temp 98.6°F | Ht 68.0 in | Wt 250.0 lb

## 2019-06-04 DIAGNOSIS — I10 Essential (primary) hypertension: Secondary | ICD-10-CM | POA: Diagnosis not present

## 2019-06-04 DIAGNOSIS — Z954 Presence of other heart-valve replacement: Secondary | ICD-10-CM | POA: Diagnosis not present

## 2019-06-04 DIAGNOSIS — E1159 Type 2 diabetes mellitus with other circulatory complications: Secondary | ICD-10-CM

## 2019-06-04 DIAGNOSIS — L03811 Cellulitis of head [any part, except face]: Secondary | ICD-10-CM | POA: Insufficient documentation

## 2019-06-04 MED ORDER — ATORVASTATIN CALCIUM 20 MG PO TABS: 20.0000 mg | ORAL_TABLET | Freq: Every day | ORAL | 0 refills | Status: DC

## 2019-06-04 MED ORDER — CARVEDILOL 25 MG PO TABS: 25.0000 mg | ORAL_TABLET | Freq: Two times a day (BID) | ORAL | 0 refills | Status: DC

## 2019-06-04 MED ORDER — SULFAMETHOXAZOLE-TRIMETHOPRIM 800-160 MG PO TABS: 1.0000 | ORAL_TABLET | Freq: Two times a day (BID) | ORAL | 0 refills | Status: DC

## 2019-06-04 NOTE — Telephone Encounter (Signed)
06/04/2019 9:48:30 AM - Apidra refill & page 4 to [facility]  06/04/2019 Faxed Apidra (Inject 50 units under the skin 2 times a day #6) refill request and page 4 of application due to provider change-now Aura Dials, AGNP @ [facility] [place]

## 2019-06-04 NOTE — Progress Notes (Signed)
BP (!) 153/90 (BP Location: Left Arm, Patient Position: Sitting, Cuff Size: Normal)   Pulse 99   Temp 98.6 F (37 C) (Oral)   Ht 5\' 8"  (1.727 m)   Wt 250 lb (113.4 kg)   SpO2 95%   BMI 38.01 kg/m    Subjective:    Patient ID: [name], male    DOB: [birth year], 59 y.o.   MRN: [id number]  HPI: Rodney Dixon. is a 59 y.o. male  Chief Complaint  Patient presents with  . Insect Bite    Painful. Ongoing 4-5 days. Back of head.   BUG BITE Duration: days, worked under house a couple of days ago Location: back of neck Pain:  yes Quality: nagging  Severity: moderate Redness:  no Swelling:  no Warmth:  no Oozing:  no Pus:  no, but blood Treatments attempted:neosporin, tried opening it, takes Tylenol when headache comes Past similar infections:  no Past MRSA skin infections:  no History of trauma in area:  no Fevers:  no Nausea/vomiting:  no   Patient reports that he is out of his Coreg and Lipitor and he is not taking benazepril anymore due to sexual side effects.  No Known Allergies  Outpatient Encounter Medications as of 06/04/2019  Medication Sig Note  . carvedilol (COREG) 25 MG tablet Take 1 tablet (25 mg total) by mouth 2 (two) times daily with a meal.   . dapagliflozin propanediol (FARXIGA) 10 MG TABS tablet Take 10 mg by mouth daily.   . Glucose Blood (BLOOD GLUCOSE TEST STRIPS) STRP 1 strip by In Vitro route daily.   . hydrochlorothiazide (HYDRODIURIL) 25 MG tablet Take 1 tablet (25 mg total) by mouth daily.   . insulin degludec (TRESIBA FLEXTOUCH) 100 UNIT/ML SOPN FlexTouch Pen Inject 0.25 mLs (25 Units total) into the skin daily. 04/12/2019: 24 units daily   . insulin lispro (HUMALOG) 100 UNIT/ML injection 50 units 2 times daily   . metFORMIN (GLUCOPHAGE) 500 MG tablet TAKE 2 TABLETS BY MOUTH TWICE A DAY   . warfarin (COUMADIN) 10 MG tablet Take 1 tablet (10 mg total) by mouth daily. Take as directed   . warfarin (COUMADIN) 2 MG tablet Take 1 tab in  combination with the 10 mg coumadin on Tuesdays and Thursdays   . [DISCONTINUED] carvedilol (COREG) 25 MG tablet Take 1 tablet (25 mg total) by mouth 2 (two) times daily with a meal.   . atorvastatin (LIPITOR) 20 MG tablet Take 1 tablet (20 mg total) by mouth daily.   . benazepril (LOTENSIN) 40 MG tablet Take 1 tablet (40 mg total) by mouth daily. (Patient not taking: Reported on 06/04/2019)   . fenofibrate (TRICOR) 145 MG tablet Take 1 tablet (145 mg total) by mouth daily.   . Semaglutide, 1 MG/DOSE, (OZEMPIC, 1 MG/DOSE,) 2 MG/1.5ML SOPN Inject 1 mg into the skin once a week. (Patient not taking: Reported on 06/04/2019)   . sulfamethoxazole-trimethoprim (BACTRIM DS) 800-160 MG tablet Take 1 tablet by mouth 2 (two) times daily.   06/06/2019 warfarin (COUMADIN) 4 MG tablet Take 1 tablet (4 mg total) by mouth as directed. (Patient not taking: Reported on 06/04/2019)   . warfarin (COUMADIN) 5 MG tablet Take 1 tablet (5 mg total) by mouth as directed. (Patient not taking: Reported on 06/04/2019)   . [DISCONTINUED] atorvastatin (LIPITOR) 20 MG tablet Take 1 tablet (20 mg total) by mouth daily.    No facility-administered encounter medications on file as of 06/04/2019.  Patient Active Problem List   Diagnosis Date Noted  . Cellulitis of scalp 06/04/2019  . Acquired thrombophilia (HCC) 04/03/2019  . Body mass index (BMI) 38.0-38.9, adult 04/03/2019  . Morbid obesity (HCC) 07/12/2018  . Noncompliance w/medication treatment due to intermit use of medication 07/12/2018  . Low back pain 07/26/2017  . Depression, major, single episode, moderate (HCC) 05/30/2017  . Fatigue 08/06/2015  . Hyperlipidemia associated with type 2 diabetes mellitus (HCC) 04/15/2015  . S/P aortic valve replacement with metallic valve 01/29/2015  . Presence of other heart-valve replacement 01/29/2015  . Chronic atrial fibrillation (HCC) 11/24/2014  . Hypertension associated with type 2 diabetes mellitus (HCC) 11/24/2014  . 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 Allergy   . Diabetes mellitus without complication (HCC)   . Hyperlipidemia   . Hypertension    Relevant past medical, surgical, family and social history reviewed and updated as indicated. Interim medical history since our last visit reviewed. Allergies and medications reviewed and updated.  Review of Systems  Constitutional: Negative.  Negative for fatigue and fever.  Eyes: Negative.  Negative for pain and visual disturbance.  Gastrointestinal: Negative.  Negative for nausea and vomiting.  Musculoskeletal: Positive for neck pain and neck stiffness. Negative for arthralgias.  Skin: Positive for wound (bug bite to back of head).  Neurological: Positive for headaches (pain radiates to front of head). Negative for dizziness, tremors and weakness.  Psychiatric/Behavioral: Negative.  Negative for agitation and sleep disturbance. The patient is not nervous/anxious.     Per HPI unless specifically indicated above     Objective:    BP (!) 153/90 (BP Location: Left Arm, Patient Position: Sitting, Cuff Size: Normal)   Pulse 99   Temp 98.6 F (37 C) (Oral)   Ht 5\' 8"  (1.727 m)   Wt 250 lb (113.4 kg)   SpO2 95%   BMI 38.01 kg/m   Wt Readings from Last 3 Encounters:  06/04/19 250 lb (113.4 kg)  05/08/19 252 lb (114.3 kg)  04/25/19 253 lb (114.8 kg)    Physical Exam Vitals and nursing note reviewed.  Constitutional:      General: He is not in acute distress.    Appearance: He is obese. He is not ill-appearing.  HENT:     Head: Normocephalic and atraumatic.   Eyes:     General: No scleral icterus.    Extraocular Movements: Extraocular movements intact.     Pupils: Pupils are equal, round, and reactive to light.  Pulmonary:     Effort: Pulmonary effort is normal. No respiratory distress.  Musculoskeletal:        General: Normal range of motion.     Cervical back: Normal  range of motion and neck supple. No rigidity or tenderness.  Skin:    General: Skin is warm and dry.     Capillary Refill: Capillary refill takes less than 2 seconds.     Coloration: Skin is not ashen or cyanotic.     Findings: Lesion present.       Neurological:     General: No focal deficit present.     Mental Status: He is alert and oriented to person, place, and time.     Motor: No weakness.     Gait: Gait normal.  Psychiatric:        Mood and Affect: Mood normal.        Behavior: Behavior normal.  Thought Content: Thought content normal.        Judgment: Judgment normal.     Results for orders placed or performed in visit on 05/08/19  CoaguChek XS/INR Waived (STAT)  Result Value Ref Range   INR 2.6 (H) 0.9 - 1.1   Prothrombin Time 31.6 sec      Assessment & Plan:   Problem List Items Addressed This Visit      Cardiovascular and Mediastinum   Hypertension associated with type 2 diabetes mellitus ([place])    Patient's BP elevated today and patient reports not currently taking Benazepril due to sexual side effects.  Refill given for Coreg and Lipitor and patient advised to keep a log of his BP at home this week and over the weekend and to bring this log with him to his appointment Monday.  May need to discuss changing BP medication due to perceived side effects.        Relevant Medications   carvedilol (COREG) 25 MG tablet   atorvastatin (LIPITOR) 20 MG tablet     Other   S/P aortic valve replacement with metallic valve   Relevant Orders   CoaguChek XS/INR Waived   Cellulitis of scalp - Primary    Acute, ongoing.  Patient with scalp tenderness upon palpation of nodule and pain with ROM.  Given size and high risk for infection due to history of uncontrolled diabetes, will start 7 day course of Bactrim.  Patient to follow up on wound early next week.  Will also recheck INR early next week since starting Bactrim.  Discussed s/s of worsening infection and return to  clinic if infection worsens or pain at site increases.      Relevant Medications   sulfamethoxazole-trimethoprim (BACTRIM DS) 800-160 MG tablet       Follow up plan: Return if symptoms worsen or fail to improve.

## 2019-06-04 NOTE — Assessment & Plan Note (Addendum)
Acute, ongoing.  Patient with scalp tenderness upon palpation of nodule and pain with ROM.  Given size and high risk for infection due to history of uncontrolled diabetes, will start 7 day course of Bactrim.  Patient to follow up on wound early next week.  Will also recheck INR early next week since starting Bactrim.  Discussed s/s of worsening infection and return to clinic if infection worsens or pain at site increases.

## 2019-06-04 NOTE — Patient Instructions (Signed)

## 2019-06-04 NOTE — Assessment & Plan Note (Addendum)
Chronic, ongoing.  Patient's BP elevated today and patient reports not currently taking Benazepril due to sexual side effects.  Refill given for Coreg and Lipitor.  Patient advised to keep a log of his BP at home this week and over the weekend and to bring this log with him to his appointment Monday.  May need to discuss changing BP medication if patient is unwilling to take.

## 2019-06-05 ENCOUNTER — Ambulatory Visit: Payer: Self-pay | Admitting: Family Medicine

## 2019-06-07 ENCOUNTER — Ambulatory Visit: Payer: Self-pay | Admitting: Pharmacist

## 2019-06-07 DIAGNOSIS — E1159 Type 2 diabetes mellitus with other circulatory complications: Secondary | ICD-10-CM

## 2019-06-07 DIAGNOSIS — E1169 Type 2 diabetes mellitus with other specified complication: Secondary | ICD-10-CM

## 2019-06-07 NOTE — Chronic Care Management (AMB) (Signed)
Chronic Care Management   Follow Up Note   06/07/2019 Name: Rodney STAIGER [name] MRN: [id number] DOB: 1960/11/26  Referred by: Guadalupe Maple, MD Reason for referral : Chronic Care Management (Medication Management)   [name] is a 59 y.o. year old male who is a primary care patient of Crissman, Jeannette How, MD. The CCM team was consulted for assistance with chronic disease management and care coordination needs.    Contacted patient for medication management review.   Review of patient status, including review of consultants reports, relevant laboratory and other test results, and collaboration with appropriate care team members and the patient's provider was performed as part of comprehensive patient evaluation and provision of chronic care management services.    SDOH (Social Determinants of Health) screening performed today: Financial Strain . See Care Plan for related entries.   Outpatient Encounter Medications as of 06/07/2019  Medication Sig Note  . atorvastatin (LIPITOR) 20 MG tablet Take 1 tablet (20 mg total) by mouth daily.   . carvedilol (COREG) 25 MG tablet Take 1 tablet (25 mg total) by mouth 2 (two) times daily with a meal.   . dapagliflozin propanediol (FARXIGA) 10 MG TABS tablet Take 10 mg by mouth daily.   . fenofibrate (TRICOR) 145 MG tablet Take 1 tablet (145 mg total) by mouth daily.   . Glucose Blood (BLOOD GLUCOSE TEST STRIPS) STRP 1 strip by In Vitro route daily.   . hydrochlorothiazide (HYDRODIURIL) 25 MG tablet Take 1 tablet (25 mg total) by mouth daily.   . insulin degludec (TRESIBA FLEXTOUCH) 100 UNIT/ML SOPN FlexTouch Pen Inject 0.25 mLs (25 Units total) into the skin daily. 04/12/2019: 24 units daily   . insulin lispro (HUMALOG) 100 UNIT/ML injection 50 units 2 times daily   . metFORMIN (GLUCOPHAGE) 500 MG tablet TAKE 2 TABLETS BY MOUTH TWICE A DAY   . warfarin (COUMADIN) 10 MG tablet Take 1 tablet (10 mg total) by mouth daily. Take as directed   .  warfarin (COUMADIN) 2 MG tablet Take 1 tab in combination with the 10 mg coumadin on Tuesdays and Thursdays   . benazepril (LOTENSIN) 40 MG tablet Take 1 tablet (40 mg total) by mouth daily. (Patient not taking: Reported on 06/04/2019)   . Semaglutide, 1 MG/DOSE, (OZEMPIC, 1 MG/DOSE,) 2 MG/1.5ML SOPN Inject 1 mg into the skin once a week. (Patient not taking: Reported on 06/04/2019)   . sulfamethoxazole-trimethoprim (BACTRIM DS) 800-160 MG tablet Take 1 tablet by mouth 2 (two) times daily.   Marland Kitchen warfarin (COUMADIN) 4 MG tablet Take 1 tablet (4 mg total) by mouth as directed. (Patient not taking: Reported on 06/04/2019)   . warfarin (COUMADIN) 5 MG tablet Take 1 tablet (5 mg total) by mouth as directed. (Patient not taking: Reported on 06/04/2019)    No facility-administered encounter medications on file as of 06/07/2019.     Objective:   Goals Addressed            This Visit's Progress     Patient Stated   . PharmD "I need to work on diabetes" (pt-stated)       Current Barriers:  . Diabetes: uncontrolled; complicated by chronic medical conditions including HTN, HLD, afib, most recent A1c 9.0% o Notes he has recently been re-enrolled in insurance coverage o Discussed his recent spot of cellulitis on his head; notes that it is "taking it's sweet time" to heal . Current antihyperglycemic regimen: metformin 1000 mg BID Tresiba 24 units QAM, Novolog  50 units BID (though eats 3 meals daily), Farxiga 10 mg daily, Ozempic 1 mg weekly - has stopped this medication, doesn't like being on as many shots as he is right now . Current blood glucose readings: unable to review recent readings, doesn't have his meter with him. Reports episodes of hypoglycemia, but does not elaborate how often or to what degree . Cardiovascular risk reduction: o Current hypertensive regimen: carvedilol 25 mg BID, HCTZ 25 mg daily; benazepril 40 mg daily- stopped d/t sexual side effects; has not been checking BP at home as  instructed because he lost his machine.  o Current hyperlipidemia regimen: atorvastatin 20 mg, fenofibrate 145 mg daily o Current antiplatelet regimen: none; warfarin for afib + valve replacement  Pharmacist Clinical Goal(s):  Marland Kitchen Over the next 90 days, patient will work with PharmD and primary care provider to address optimized glycemic control  Interventions: . Comprehensive medication review performed, medication list updated in electronic medical record . Reviewed availability of coupon cards for Loura Pardon, Tresiba, and Humalog, now that he is commercially insured. He notes that he will investigate these . Discussed impact of poor sugar control on wound healing . Reviewed principals of DM management, and goal to maximize non-insulin therapies to prevent weight gain, hypoglycemia. Reviewed weight loss benefit of Ozempic therapy, and that by restarting Ozempic, we can reduce insulin burden. He will discuss w/ Aura Dials at appointment next week.  . Discussed basal/bolus mismatch between 24 units Tresiba and 100 units of Humalog daily. Reviewed that Humalog is likely causing lows. If Ozempic is restarted, would recommend empiric dose reduction of Humalog, plus eventual increase in [place] to target 50:50 ratio of basal to bolus insulin daily . Reiterated importance of BP control given cardiac hx. Encouraged to purchase a new BP machine, and encouraged to discuss additional options for BP control w/ Jolene Cannady at upcoming appointment. Unable to ascertain if his reported sexual side effects improved w/ his self-discontinuation of benezepril. Much more likely that these "side effects" are related to uncontrolled diabetes  Patient Self Care Activities:  . Patient will check blood glucose BID , document, and provide at future appointments . Patient will take medications as prescribed . Patient will report any questions or concerns to provider   Please see past updates related to this  goal by clicking on the "Past Updates" button in the selected goal          Plan:  - Scheduled f/u call 07/23/19  Catie Feliz Beam, PharmD, [facility] Clinical Pharmacist [facility] [phone]

## 2019-06-07 NOTE — Patient Instructions (Signed)
Visit Information  Goals Addressed            This Visit's Progress     Patient Stated   . PharmD "I need to work on diabetes" (pt-stated)       Current Barriers:  . Diabetes: uncontrolled; complicated by chronic medical conditions including HTN, HLD, afib, most recent A1c 9.0% o Notes he has recently been re-enrolled in insurance coverage o Discussed his recent spot of cellulitis on his head; notes that it is "taking it's sweet time" to heal . Current antihyperglycemic regimen: metformin 1000 mg BID Tresiba 24 units QAM, Novolog 50 units BID (though eats 3 meals daily), Farxiga 10 mg daily, Ozempic 1 mg weekly - has stopped this medication, doesn't like being on as many shots as he is right now . Current blood glucose readings: unable to review recent readings, doesn't have his meter with him. Reports episodes of hypoglycemia, but does not elaborate how often or to what degree . Cardiovascular risk reduction: o Current hypertensive regimen: carvedilol 25 mg BID, HCTZ 25 mg daily; benazepril 40 mg daily- stopped d/t sexual side effects; has not been checking BP at home as instructed because he lost his machine.  o Current hyperlipidemia regimen: atorvastatin 20 mg, fenofibrate 145 mg daily o Current antiplatelet regimen: none; warfarin for afib + valve replacement  Pharmacist Clinical Goal(s):  Marland Kitchen Over the next 90 days, patient will work with PharmD and primary care provider to address optimized glycemic control  Interventions: . Comprehensive medication review performed, medication list updated in electronic medical record . Reviewed availability of coupon cards for Loura Pardon, Tresiba, and Humalog, now that he is commercially insured. He notes that he will investigate these . Discussed impact of poor sugar control on wound healing . Reviewed principals of DM management, and goal to maximize non-insulin therapies to prevent weight gain, hypoglycemia. Reviewed weight loss benefit of  Ozempic therapy, and that by restarting Ozempic, we can reduce insulin burden. He will discuss w/ Aura Dials at appointment next week.  . Discussed basal/bolus mismatch between 24 units Tresiba and 100 units of Humalog daily. Reviewed that Humalog is likely causing lows. If Ozempic is restarted, would recommend empiric dose reduction of Humalog, plus eventual increase in [place] to target 50:50 ratio of basal to bolus insulin daily . Reiterated importance of BP control given cardiac hx. Encouraged to purchase a new BP machine, and encouraged to discuss additional options for BP control w/ Jolene Cannady at upcoming appointment. Unable to ascertain if his reported sexual side effects improved w/ his self-discontinuation of benezepril. Much more likely that these "side effects" are related to uncontrolled diabetes  Patient Self Care Activities:  . Patient will check blood glucose BID , document, and provide at future appointments . Patient will take medications as prescribed . Patient will report any questions or concerns to provider   Please see past updates related to this goal by clicking on the "Past Updates" button in the selected goal         The patient verbalized understanding of instructions provided today and declined a print copy of patient instruction materials.   Plan:  - Scheduled f/u call 07/23/19  Catie Feliz Beam, PharmD, [facility] Clinical Pharmacist [facility] [phone]

## 2019-06-10 ENCOUNTER — Ambulatory Visit (INDEPENDENT_AMBULATORY_CARE_PROVIDER_SITE_OTHER): Payer: 59 | Admitting: Nurse Practitioner

## 2019-06-10 ENCOUNTER — Encounter: Payer: Self-pay | Admitting: Nurse Practitioner

## 2019-06-10 ENCOUNTER — Other Ambulatory Visit: Payer: Self-pay

## 2019-06-10 VITALS — BP 135/68 | HR 84 | Temp 97.7°F

## 2019-06-10 DIAGNOSIS — D6869 Other thrombophilia: Secondary | ICD-10-CM | POA: Diagnosis not present

## 2019-06-10 DIAGNOSIS — L03811 Cellulitis of head [any part, except face]: Secondary | ICD-10-CM

## 2019-06-10 DIAGNOSIS — Z954 Presence of other heart-valve replacement: Secondary | ICD-10-CM | POA: Diagnosis not present

## 2019-06-10 LAB — COAGUCHEK XS/INR WAIVED
INR: 4.3 — ABNORMAL HIGH (ref 0.9–1.1)
Prothrombin Time: 51.1 s

## 2019-06-10 MED ORDER — TRESIBA FLEXTOUCH 100 UNIT/ML ~~LOC~~ SOPN: 25.0000 [IU] | PEN_INJECTOR | Freq: Every day | SUBCUTANEOUS | 12 refills | Status: DC

## 2019-06-10 NOTE — Assessment & Plan Note (Addendum)
Chronic Coumadin use.  INR today 4.3, recent abx therapy.  Have recommended hold dose this evening and tomorrow evening.  Will have back in clinic on Wednesday and recheck INR, may need to decrease 10-20% (currently weekly dose 74 MG total -- may need to take to 60 to 67 MG if continued INR elevation.  Consider 9 MG daily ot 10 MG daily next visit.  To notify provider immediately if any bleeding episodes.  Highly recommended NO use of Ibuprofen for pain.

## 2019-06-10 NOTE — Patient Instructions (Signed)

## 2019-06-10 NOTE — Assessment & Plan Note (Signed)
Acute with improvement.  Continue Bactrim to completion, has two more days.  Recommend use of warm compresses at home to further enhance drainage and help discomfort.  Apply abx ointment to scalp a few times a day.  Monitor for increased warmth, tenderness, or edema.  Notify provider immediately if present.  Return to office on Wednesday.

## 2019-06-10 NOTE — Assessment & Plan Note (Signed)
Monitor CBC with use of daily Coumadin for mechanical valve and a-fib.  Denies any current bleeding episodes. 

## 2019-06-10 NOTE — Progress Notes (Signed)
BP 135/68   Pulse 84   Temp 97.7 F (36.5 C) (Oral)   SpO2 95%    Subjective:    Patient ID: Rodney Chary [name], male    DOB: [birth year], 59 y.o.   MRN: [id number]  HPI: Rodney Komatsu. is a 59 y.o. male  Chief Complaint  Patient presents with  . Valve Replacement  . Cellulitis    on back of scalp, recheck from last week per patient   AORTIC VALVE REPLACEMENT: This patient is a 59 y.o. male who presents for coumadin management. The expected duration of coumadin treatment is lifelong The reason for anticoagulation is  mechanical heart valve.  INR today 4.3.  Last one on 05/08/2019 was 2.6.  Recently was treated for cellulitis with Bactrim and has taken Ibuprofen for pain at back of scalp recently.  Denies any bruising or bleeding.  Present Coumadin dose: 10 mg once a day, alternate with 12 mg on Tuesday and Friday Goal: 2.5-3.5 The patient does not have an active anticoagulation episode. Excessive bruising:no Nose bleeding:no Rectal bleeding:no Prolonged menstrual cycles:N/A Eating diet with consistent amounts of foods containing Vitamin K:yes Any recent antibiotic use? yes  SKIN INFECTION Started treatment for cellulitis to back of scalp from bug bite on 06/04/2019.  Treated with Bactrim.  He reports it has recently started draining.  Endorses area was painful for a couple days and he did take Ibuprofen for this, since Tylenol did not work, but has not taken this in 24 hours.   Duration: days Location: back of scalp History of trauma in area: no Pain: yes Quality: mild Severity: mild Redness: yes Swelling: yes Oozing: yes Pus: no Fevers: no Nausea/vomiting: no Status: improving with draining Treatments attempted:antibiotics  Tetanus: UTD  Relevant past medical, surgical, family and social history reviewed and updated as indicated. Interim medical history since our last visit reviewed. Allergies and medications reviewed and updated.  Review of Systems    Constitutional: Negative for activity change, diaphoresis, fatigue and fever.  Respiratory: Negative for cough, chest tightness, shortness of breath and wheezing.   Cardiovascular: Negative for chest pain, palpitations and leg swelling.  Endocrine: Negative for cold intolerance, heat intolerance, polydipsia, polyphagia and polyuria.  Skin: Positive for wound.  Neurological: Negative.   Psychiatric/Behavioral: Negative.     Per HPI unless specifically indicated above     Objective:    BP 135/68   Pulse 84   Temp 97.7 F (36.5 C) (Oral)   SpO2 95%   Wt Readings from Last 3 Encounters:  06/04/19 250 lb (113.4 kg)  05/08/19 252 lb (114.3 kg)  04/25/19 253 lb (114.8 kg)    Physical Exam Vitals and nursing note reviewed.  Constitutional:      General: He is awake. He is not in acute distress.    Appearance: He is well-developed. He is morbidly obese. He is not ill-appearing.  HENT:     Head: Normocephalic and atraumatic.     Right Ear: Hearing normal. No drainage.     Left Ear: Hearing normal. No drainage.  Eyes:     General: Lids are normal.        Right eye: No discharge.        Left eye: No discharge.     Conjunctiva/sclera: Conjunctivae normal.     Pupils: Pupils are equal, round, and reactive to light.  Neck:     Vascular: No carotid bruit.  Cardiovascular:     Rate and Rhythm: Normal rate  and regular rhythm.     Heart sounds: Normal heart sounds, S1 normal and S2 normal. No murmur. No gallop.   Pulmonary:     Effort: Pulmonary effort is normal. No accessory muscle usage or respiratory distress.     Breath sounds: Normal breath sounds.  Abdominal:     General: Bowel sounds are normal.     Palpations: Abdomen is soft.  Musculoskeletal:        General: Normal range of motion.     Cervical back: Normal range of motion and neck supple.     Right lower leg: No edema.     Left lower leg: No edema.  Skin:    General: Skin is warm and dry.     Findings: Wound  present.     Comments: Round, approx 1 cm, firm, raised mass to posterior occiput.  Area with pinpoint opening in middle, draining sero-sang drainage. No warmth or tenderness.  Able to gently press on area and further drain moderate amount of sero-sang drainage, which pt reported relief with.  No other bites or rashes noted.  Neurological:     Mental Status: He is alert and oriented to person, place, and time.  Psychiatric:        Mood and Affect: Mood normal.        Behavior: Behavior normal. Behavior is cooperative.        Thought Content: Thought content normal.        Judgment: Judgment normal.     Results for orders placed or performed in visit on 05/08/19  CoaguChek XS/INR Waived (STAT)  Result Value Ref Range   INR 2.6 (H) 0.9 - 1.1   Prothrombin Time 31.6 sec      Assessment & Plan:   Problem List Items Addressed This Visit      Hematopoietic and Hemostatic   Acquired thrombophilia (Rodney Dixon)    Monitor CBC with use of daily Coumadin for mechanical valve and a-fib.  Denies any current bleeding episodes.        Other   S/P aortic valve replacement with metallic valve - Primary    Chronic Coumadin use.  INR today 4.3, recent abx therapy.  Have recommended hold dose this evening and tomorrow evening.  Will have back in clinic on Wednesday and recheck INR, may need to decrease 10-20% (currently weekly dose 74 MG total -- may need to take to 60 to 67 MG if continued INR elevation.  Consider 9 MG daily ot 10 MG daily next visit.  To notify provider immediately if any bleeding episodes.  Highly recommended NO use of Ibuprofen for pain.      Relevant Orders   CoaguChek XS/INR Waived   Cellulitis of scalp    Acute with improvement.  Continue Bactrim to completion, has two more days.  Recommend use of warm compresses at home to further enhance drainage and help discomfort.  Apply abx ointment to scalp a few times a day.  Monitor for increased warmth, tenderness, or edema.  Notify  provider immediately if present.  Return to office on Wednesday.          Follow up plan: Return in about 2 days (around 06/12/2019) for INR check.

## 2019-06-12 ENCOUNTER — Encounter: Payer: Self-pay | Admitting: Nurse Practitioner

## 2019-06-12 ENCOUNTER — Other Ambulatory Visit: Payer: Self-pay

## 2019-06-12 ENCOUNTER — Ambulatory Visit (INDEPENDENT_AMBULATORY_CARE_PROVIDER_SITE_OTHER): Payer: 59 | Admitting: Nurse Practitioner

## 2019-06-12 VITALS — BP 138/82 | HR 92 | Temp 98.3°F

## 2019-06-12 DIAGNOSIS — Z954 Presence of other heart-valve replacement: Secondary | ICD-10-CM

## 2019-06-12 LAB — COAGUCHEK XS/INR WAIVED
INR: 1.6 — ABNORMAL HIGH (ref 0.9–1.1)
Prothrombin Time: 19.5 s

## 2019-06-12 NOTE — Progress Notes (Signed)
   BP 138/82 (BP Location: Left Arm, Patient Position: Sitting)   Pulse 92   Temp 98.3 F (36.8 C) (Oral)   SpO2 96%    Subjective:    Patient ID: Rodney Chary [name], male    DOB: 01-06-1961, 59 y.o.   MRN: [id number]  CC: Coumadin management  HPI: This patient is a59 y.o.malewho presents for coumadin management. The expected duration of coumadin treatment islifelongThe reason for anticoagulation is mechanical heart valve.INR two days ago 4.3, he was instructed to hold doses for two days.  Last one on 05/08/2019 was 2.6. Recently was treated for cellulitis with Bactrim and has taken Ibuprofen for pain at back of scalp recently. Denies any bruising or bleeding.  He has now completed Bactrim, completed yesterday.  Today INR is 1.6.  Present Coumadin dose:10 mg once a day,alternate with 12 mg on Tuesday and Friday Goal:2.5-3.5The patient does not have an active anticoagulation episode. Excessive bruising:no Nose bleeding:no Rectal bleeding:no Prolonged menstrual cycles:N/A Eating diet with consistent amounts of foods containing Vitamin K:yes Any recent antibiotic use? yes  Relevant past medical, surgical, family and social history reviewed and updated as indicated. Interim medical history since our last visit reviewed. Allergies and medications reviewed and updated.  ROS: Per HPI unless specifically indicated above     Objective:    BP 138/82 (BP Location: Left Arm, Patient Position: Sitting)   Pulse 92   Temp 98.3 F (36.8 C) (Oral)   SpO2 96%   Wt Readings from Last 3 Encounters:  06/04/19 250 lb (113.4 kg)  05/08/19 252 lb (114.3 kg)  04/25/19 253 lb (114.8 kg)     General: Well appearing, well nourished in no distress.  Normal mood and affect. Skin: No excessive bruising or rash  Last INR:     Last CBC:  Lab Results  Component Value Date   WBC 7.5 04/03/2019   HGB 15.4 04/03/2019   HCT 46.5 04/03/2019   MCV 82 04/03/2019   PLT 198 04/03/2019     Results for orders placed or performed in visit on 06/10/19  CoaguChek XS/INR Waived  Result Value Ref Range   INR 4.3 (H) 0.9 - 1.1   Prothrombin Time 51.1 sec       Assessment:     ICD-10-CM   1. S/P aortic valve replacement with metallic valve  Z95.4 CoaguChek XS/INR Waived    Ambulatory referral to Cardiology    Plan:   Discussed current plan face-to-face with patient. For coumadin dosing, elected to change dose to 10 MG daily, restart this evening. Will plan to recheck INR in 1 week.

## 2019-06-12 NOTE — Patient Instructions (Signed)
Bleeding Precautions When on Anticoagulant Therapy, Adult Anticoagulant therapy, also called blood thinner therapy, is medicine that helps to prevent and treat blood clots. The medicine works by stopping blood clots from forming or growing. Blood clots that form in your blood vessels can be dangerous. They can break loose and travel to the heart, lungs, or brain. This increases the risk of a heart attack, stroke, or blocked lung artery (pulmonary embolism). Anticoagulants also increase the risk of bleeding. Try to protect yourself from cuts and other injuries that can cause bleeding. It is important to take anticoagulants exactly as told by your health care provider. Why do I need to be on anticoagulant therapy? You may need this medicine if you are at risk of developing a blood clot. Conditions that increase your risk of a blood clot include:  Being born with heart disease or a heart malformation (congenital heart disease).  Developing heart disease.  Having had surgery, such as valve replacement.  Having had a serious accident or other type of severe injury (trauma).  Having certain types of cancer.  Having certain diseases that can increase blood clotting.  Having a high risk of stroke or heart attack.  Having atrial fibrillation (AF). What are the common anticoagulant medicines? There are several types of anticoagulant medicines. The most common types are:  Medicines that you take by mouth (oral medicines), such as: ? Warfarin. ? Novel oral anticoagulants (NOACs), such as:  Direct thrombin inhibitors (dabigatran).  Factor Xa inhibitors (apixaban, edoxaban, and rivaroxaban).  Injections, such as: ? Unfractionated heparin. ? Low molecular weight heparin. These anticoagulants work in different ways to prevent blood clots. They also have different risks and side effects. What do I need to remember while on anticoagulant therapy? Taking anticoagulants  Take your medicine at the  same time every day. If you forget to take your medicine, take it as soon as you remember. Do not double your dosage of medicine if you miss a whole day. Take your normal dose and call your health care provider.  Do not stop taking your medicine unless your health care provider approves. Stopping the medicine can increase your risk of developing a blood clot. Taking other medicines  Take over-the-counter and prescriptions medicines only as told by your health care provider.  Do not take over-the-counter NSAIDs, including aspirin and ibuprofen, while you are on anticoagulant therapy. These medicines increase your risk of dangerous bleeding.  Get approval from your health care provider before you start taking any new medicines, vitamins, or herbal products. Some of these could interfere with your therapy. General instructions  Keep all follow-up visits as told by your health care provider. This is important.  If you are pregnant or trying to get pregnant, talk with a health care provider about anticoagulants. Some of these medicines are not safe to take during pregnancy.  Tell all health care providers, including your dentist, that you are on anticoagulant therapy. It is especially important to tell providers before you have any surgery, medical procedures, or dental work done. What precautions should I take?   Be very careful when using knives, scissors, or other sharp objects.  Use an electric razor instead of a blade.  Do not use toothpicks.  Use a soft-bristled toothbrush. Brush your teeth gently.  Always wear shoes outdoors and wear slippers indoors.  Be careful when cutting your fingernails and toenails.  Place bath mats in the bathroom. If possible, install handrails as well.  Wear gloves while you do   yard work.  Wear your seat belt.  Prevent falls by removing loose rugs and extension cords from areas where you walk. Use a cane or walker if you need it.  Avoid  constipation by: ? Drinking enough fluid to keep your urine clear or pale yellow. ? Eating foods that are high in fiber, such as fresh fruits and vegetables, whole grains, and beans. ? Limiting foods that are high in fat and processed sugars, such as fried and sweet foods.  Do not play contact sports or participate in other activities that have a high risk for injury. What other precautions are important if on warfarin therapy? If you are taking a type of anticoagulant called warfarin, make sure you:  Work with a diet and nutrition specialist (dietitian) to make an eating plan. Do not make any sudden changes to your diet after you have started your eating plan.  Do not drink alcohol. It can interfere with your medicine and increase your risk of an injury that causes bleeding.  Get regular blood tests as told by your health care provider. What are some questions to ask my health care provider?  Why do I need anticoagulant therapy?  What is the best anticoagulant therapy for my condition?  How long will I need anticoagulant therapy?  What are the side effects of anticoagulant therapy?  When should I take my medicine? What should I do if I forget to take it?  Will I need to have regular blood tests?  Do I need to change my diet? Are there foods or drinks that I should avoid?  What activities are safe for me?  What should I do if I want to get pregnant? Contact a health care provider if:  You miss a dose of medicine: ? And you are not sure what to do. ? For more than one day.  You have: ? Menstrual bleeding that is heavier than normal. ? Bloody or brown urine. ? Easy bruising. ? Black and tarry stool or bright red stool. ? Side effects from your medicine.  You feel weak or dizzy.  You become pregnant. Get help right away if:  You have bleeding that will not stop within 20 minutes from: ? The nose. ? The gums. ? A cut on the skin.  You have a severe headache or  stomachache.  You vomit or cough up blood.  You fall or hit your head. Summary  Anticoagulant therapy, also called blood thinner therapy, is medicine that helps to prevent and treat blood clots.  Anticoagulants work in different ways to prevent blood clots. They also have different risks and side effects.  Talk with your health care provider about any precautions that you should take while on anticoagulant therapy. This information is not intended to replace advice given to you by your health care provider. Make sure you discuss any questions you have with your health care provider. Document Revised: 08/15/2018 Document Reviewed: 07/12/2016 Elsevier Patient Education  2020 [employer]  

## 2019-06-12 NOTE — Assessment & Plan Note (Signed)
Chronic, ongoing on long term Coumadin.  INR today 1.6.  Will restart Coumadin at 10 MG daily, has completed INR.  Return to office in one week for INR check.  Referral to cardiology, patient has not seen in several years and would like heart check.  Recommended he visit with them at least yearly.

## 2019-06-14 ENCOUNTER — Other Ambulatory Visit: Payer: Self-pay | Admitting: Family Medicine

## 2019-06-21 ENCOUNTER — Encounter: Payer: Self-pay | Admitting: Nurse Practitioner

## 2019-06-21 ENCOUNTER — Other Ambulatory Visit: Payer: Self-pay

## 2019-06-21 ENCOUNTER — Ambulatory Visit (INDEPENDENT_AMBULATORY_CARE_PROVIDER_SITE_OTHER): Payer: 59 | Admitting: Nurse Practitioner

## 2019-06-21 VITALS — BP 143/88 | HR 80 | Temp 98.4°F

## 2019-06-21 DIAGNOSIS — E1159 Type 2 diabetes mellitus with other circulatory complications: Secondary | ICD-10-CM

## 2019-06-21 DIAGNOSIS — E1169 Type 2 diabetes mellitus with other specified complication: Secondary | ICD-10-CM

## 2019-06-21 DIAGNOSIS — D6869 Other thrombophilia: Secondary | ICD-10-CM

## 2019-06-21 DIAGNOSIS — I482 Chronic atrial fibrillation, unspecified: Secondary | ICD-10-CM

## 2019-06-21 DIAGNOSIS — Z954 Presence of other heart-valve replacement: Secondary | ICD-10-CM

## 2019-06-21 DIAGNOSIS — I10 Essential (primary) hypertension: Secondary | ICD-10-CM

## 2019-06-21 DIAGNOSIS — Z9114 Patient's other noncompliance with medication regimen: Secondary | ICD-10-CM

## 2019-06-21 DIAGNOSIS — E785 Hyperlipidemia, unspecified: Secondary | ICD-10-CM

## 2019-06-21 LAB — BAYER DCA HB A1C WAIVED: HB A1C (BAYER DCA - WAIVED): 9.6 % — ABNORMAL HIGH (ref <7.0)

## 2019-06-21 LAB — MICROALBUMIN, URINE WAIVED
Creatinine, Urine Waived: 50 mg/dL (ref 10–300)
Microalb, Ur Waived: 80 mg/L — ABNORMAL HIGH (ref 0–19)
Microalb/Creat Ratio: >300 mg/g — ABNORMAL HIGH (ref <30)

## 2019-06-21 LAB — COAGUCHEK XS/INR WAIVED
INR: 3.1 — ABNORMAL HIGH (ref 0.9–1.1)
Prothrombin Time: 37 s

## 2019-06-21 MED ORDER — METFORMIN HCL 500 MG PO TABS: ORAL_TABLET | ORAL | 3 refills | Status: DC

## 2019-06-21 MED ORDER — OZEMPIC (0.25 OR 0.5 MG/DOSE) 2 MG/1.5ML ~~LOC~~ SOPN: 0.2500 mg | PEN_INJECTOR | SUBCUTANEOUS | 6 refills | Status: DC

## 2019-06-21 NOTE — Assessment & Plan Note (Signed)
With multiple co morbidities present.  Poor adherence to diabetic diet.  Recommend continued focus on health diet choices and regular physical activity (30 minutes 5 days a week).

## 2019-06-21 NOTE — Progress Notes (Signed)
BP (!) 143/88 (BP Location: Left Arm)   Pulse 80   Temp 98.4 F (36.9 C) (Oral)   SpO2 94%    Subjective:    Patient ID: Rodney Plumber [name], male    DOB: 11/12/1960, 59 y.o.   MRN: [id number]  HPI: Rodney Dixon. is a 59 y.o. male  Chief Complaint  Patient presents with  . Diabetes  . Hyperlipidemia  . Hypertension   DIABETES  Last A1C 9.0% in November, had trended up some from previous of 8.9%.   At time he had stopped taking Metformin due to concerns about weight gain that he read online, but has started taking this again. Continues on Tresiba, [place], Humalog, [place].  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   Hypoglycemic episodes:no Polydipsia/polyuria: no Visual disturbance: no Chest pain: no Paresthesias: no Glucose Monitoring: yes             Accucheck frequency: rarely             Fasting glucose: 200's             Post prandial:             Evening:              Before meals: Taking Insulin?: yes             Long acting insulin: Tresiba 50 units -- he increased this himself after talking to CCM team he reports due to his morning sugars being elevated             Short acting insulin: 24-25 units twice a day Blood Pressure Monitoring: not checking Retinal Examination: Not up to Date Foot Exam: Up to Date Pneumovax: Up to Date Influenza: refused Aspirin: no   HYPERTENSION / HYPERLIPIDEMIA Continues on HCTZ, Lipitor, Coreg, and Fenofibrate.  Has not taken Benazepril in months due to concerns for ED.  Has not seen cardiology since 2016.  Satisfied with current treatment? yes Duration of hypertension: chronic BP monitoring frequency: not checking BP range:  BP medication side effects: no Duration of hyperlipidemia: chronic Cholesterol medication side effects: no Cholesterol supplements: none Medication compliance: fair compliance Aspirin: no Recent stressors: no Recurrent headaches: no Visual changes:  no Palpitations: no Dyspnea: no Chest pain: no Lower extremity edema: no Dizzy/lightheaded: no   ATRIAL FIBRILLATION WITH METALLIC VALVE Currently takes Coumadin 10 mg once a day, restarted last visit after holding due to elevation with abx therapy. Sees cardiology, last saw Dr. Ubaldo Glassing in 2016.  Recent INR 1.6 on 06/12/2019 and restarted Coumadin as noted above, today is 3.1. Atrial fibrillation status: stable Satisfied with current treatment: yes  Medication side effects:  no Medication compliance: good compliance Etiology of atrial fibrillation:  Palpitations:  no Chest pain:  no Dyspnea on exertion:  no Orthopnea:  no Syncope:  no Edema:  no Ventricular rate control: B-blocker Anti-coagulation: warfarin  Relevant past medical, surgical, family and social history reviewed and updated as indicated. Interim medical history since our last visit reviewed. Allergies and medications reviewed and updated.  Review of Systems  Constitutional: Negative for activity change, diaphoresis, fatigue and fever.  Respiratory: Negative for cough, chest tightness, shortness of breath and wheezing.   Cardiovascular: Negative for chest pain, palpitations and leg swelling.  Gastrointestinal: Negative.   Endocrine: Negative for polydipsia, polyphagia and polyuria.  Neurological: Negative.   Psychiatric/Behavioral: Negative.     Per HPI unless specifically indicated  above     Objective:    BP (!) 143/88 (BP Location: Left Arm)   Pulse 80   Temp 98.4 F (36.9 C) (Oral)   SpO2 94%   Wt Readings from Last 3 Encounters:  06/04/19 250 lb (113.4 kg)  05/08/19 252 lb (114.3 kg)  04/25/19 253 lb (114.8 kg)    Physical Exam Vitals and nursing note reviewed.  Constitutional:      General: He is awake. He is not in acute distress.    Appearance: He is well-developed. He is morbidly obese. He is not ill-appearing.  HENT:     Head: Normocephalic and atraumatic.     Right Ear: Hearing normal. No  drainage.     Left Ear: Hearing normal. No drainage.  Eyes:     General: Lids are normal.        Right eye: No discharge.        Left eye: No discharge.     Conjunctiva/sclera: Conjunctivae normal.     Pupils: Pupils are equal, round, and reactive to light.  Neck:     Thyroid: No thyromegaly.     Vascular: No carotid bruit.  Cardiovascular:     Rate and Rhythm: Normal rate and regular rhythm.     Heart sounds: Normal heart sounds, S1 normal and S2 normal. No murmur. No gallop.   Pulmonary:     Effort: Pulmonary effort is normal. No accessory muscle usage or respiratory distress.     Breath sounds: Normal breath sounds.  Abdominal:     General: Bowel sounds are normal.     Palpations: Abdomen is soft.  Musculoskeletal:        General: Normal range of motion.     Cervical back: Normal range of motion and neck supple.     Right lower leg: No edema.     Left lower leg: No edema.  Skin:    General: Skin is warm and dry.     Capillary Refill: Capillary refill takes less than 2 seconds.  Neurological:     Mental Status: He is alert and oriented to person, place, and time.     Deep Tendon Reflexes: Reflexes are normal and symmetric.  Psychiatric:        Mood and Affect: Mood normal.        Behavior: Behavior normal. Behavior is cooperative.        Thought Content: Thought content normal.        Judgment: Judgment normal.     Results for orders placed or performed in visit on 06/21/19  Bayer DCA Hb A1c Waived  Result Value Ref Range   HB A1C (BAYER DCA - WAIVED) 9.6 (H) <7.0 %  Microalbumin, Urine Waived  Result Value Ref Range   Microalb, Ur Waived 80 (H) 0 - 19 mg/L   Creatinine, Urine Waived 50 10 - 300 mg/dL   Microalb/Creat Ratio >300 (H) <30 mg/g  CoaguChek XS/INR Waived  Result Value Ref Range   INR 3.1 (H) 0.9 - 1.1   Prothrombin Time 37.0 sec      Assessment & Plan:   Problem List Items Addressed This Visit      Cardiovascular and Mediastinum   Chronic  atrial fibrillation (HCC)    Chronic, ongoing. INR today 3.1.  Recommend he continue Coumadin 10 MG every day.  Continue Carvedilol for rate control. Recommend he return yearly for cardiology visits.  Continue to work with CCM team.  Hypertension associated with type 2 diabetes mellitus (HCC)    Chronic, ongoing with BP above goal.  Highly recommended he continue Benazepril (for BP and kidney protection in diabetes), discussed major cause of ED is most likely poor diabetes control and not medication.  Educated him on diabetes effect on vascular system.  Continue Benazepril, HCTZ, Coreg, and Fenofibrate.  Recommend he monitor BP at home at least a few mornings a week.  Discussed with him stroke prevention goal for BP <130/90.  Return in 4 weeks.      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  Urine ALB 80 and A:C >300 (discussed need for him to take his Benazepril as ordered due to kidney protection).  Recommend he restart Ozempic (sample provided and will restart this at 0.25 MG dosing x 4 weeks, then increase to 0.5 MG dosing -- educated on coupon card). Continue Metformin,  Farxiga, Tresiba, and Humalog.  Recommended he not adjust insulin without first discussing with CCM team or provider.  Focus heavily on diabetic diet and modest weight loss, decreasing carb and sugar intake at home.  Recommend he check BS TID.  Will continue to work with CCM team to ensure adherence to medication regimen, which is a major issue.  Return in 4 weeks.      Relevant Medications   metFORMIN (GLUCOPHAGE) 500 MG tablet   Semaglutide,0.25 or 0.5MG /DOS,  (OZEMPIC, 0.25 OR 0.5 MG/DOSE,) 2 MG/1.5ML SOPN   Other Relevant Orders   Bayer DCA Hb A1c Waived (Completed)   Hyperlipidemia associated with type 2 diabetes mellitus (HCC)    Chronic, ongoing.  Continue current medication regimen and adjust as needed.  Lipid panel today.      Relevant Medications   metFORMIN (GLUCOPHAGE) 500 MG tablet   Semaglutide,0.25 or 0.5MG /DOS, (OZEMPIC, 0.25 OR 0.5 MG/DOSE,) 2 MG/1.5ML SOPN   Other Relevant Orders   Bayer DCA Hb A1c Waived (Completed)   Microalbumin, Urine Waived (Completed)   Comprehensive metabolic panel   Lipid Panel w/o Chol/HDL Ratio     Hematopoietic and Hemostatic   Acquired thrombophilia (HCC)    Monitor CBC with use of daily Coumadin for mechanical valve and a-fib.  Denies any current bleeding episodes.        Other   S/P aortic valve replacement with metallic valve    Chronic, ongoing on long term Coumadin.  INR today 3.1.  Continue Coumadin 10 MG daily.   Recommend yearly visits with cardiology, referral placed last visit.  Return in 4 weeks.      Relevant Orders   CoaguChek XS/INR Waived (Completed)   Morbid obesity (HCC)    With multiple co morbidities present.  Poor adherence to diabetic diet.  Recommend continued focus on health diet choices and regular physical activity (30 minutes 5 days a week).       Relevant Medications   metFORMIN (GLUCOPHAGE) 500 MG tablet   Semaglutide,0.25 or 0.5MG /DOS, (OZEMPIC, 0.25 OR 0.5 MG/DOSE,) 2 MG/1.5ML SOPN   Noncompliance w/medication treatment due to intermit use of medication    Major concern and reiterated to patient necessity of adhering to medication regimen as  he is at high risk for cardiac event with current co morbidities.  Continue to work along with CCM team and continue reiterating education on diabetes effect on vascular system.          Follow up plan: Return in about 4 weeks (around 07/19/2019) for INR and Diabetes.

## 2019-06-21 NOTE — Assessment & Plan Note (Signed)
Chronic, ongoing on long term Coumadin.  INR today 3.1.  Continue Coumadin 10 MG daily.   Recommend yearly visits with cardiology, referral placed last visit.  Return in 4 weeks.

## 2019-06-21 NOTE — Assessment & Plan Note (Signed)
Monitor CBC with use of daily Coumadin for mechanical valve and a-fib.  Denies any current bleeding episodes.

## 2019-06-21 NOTE — Assessment & Plan Note (Signed)
Chronic, ongoing.  Continue current medication regimen and adjust as needed. Lipid panel today. 

## 2019-06-21 NOTE — Assessment & Plan Note (Signed)
Chronic, ongoing. INR today 3.1.  Recommend he continue Coumadin 10 MG every day.  Continue Carvedilol for rate control. Recommend he return yearly for cardiology visits.  Continue to work with CCM team.

## 2019-06-21 NOTE — Patient Instructions (Signed)

## 2019-06-21 NOTE — Assessment & Plan Note (Signed)
Major concern and reiterated to patient necessity of adhering to medication regimen as he is at high risk for cardiac event with current co morbidities.  Continue to work along with CCM team and continue reiterating education on diabetes effect on vascular system.

## 2019-06-21 NOTE — Assessment & Plan Note (Addendum)
Chronic, ongoing with A1C 9.6% today.  Urine ALB 80 and A:C >300 (discussed need for him to take his Benazepril as ordered due to kidney protection).  Recommend he restart Ozempic (sample provided and will restart this at 0.25 MG dosing x 4 weeks, then increase to 0.5 MG dosing -- educated on coupon card). Continue Metformin,  Farxiga, Tresiba, and Humalog.  Recommended he not adjust insulin without first discussing with CCM team or provider.  Focus heavily on diabetic diet and modest weight loss, decreasing carb and sugar intake at home.  Recommend he check BS TID.  Will continue to work with CCM team to ensure adherence to medication regimen, which is a major issue.  Return in 4 weeks.

## 2019-06-21 NOTE — Assessment & Plan Note (Signed)
Chronic, ongoing with BP above goal.  Highly recommended he continue Benazepril (for BP and kidney protection in diabetes), discussed major cause of ED is most likely poor diabetes control and not medication.  Educated him on diabetes effect on vascular system.  Continue Benazepril, HCTZ, Coreg, and Fenofibrate.  Recommend he monitor BP at home at least a few mornings a week.  Discussed with him stroke prevention goal for BP <130/90.  Return in 4 weeks.

## 2019-06-22 LAB — LIPID PANEL W/O CHOL/HDL RATIO
Cholesterol, Total: 199 mg/dL (ref 100–199)
HDL: 30 mg/dL — ABNORMAL LOW (ref >39)
LDL Chol Calc (NIH): 106 mg/dL — ABNORMAL HIGH (ref 0–99)
Triglycerides: 365 mg/dL — ABNORMAL HIGH (ref 0–149)
VLDL Cholesterol Cal: 63 mg/dL — ABNORMAL HIGH (ref 5–40)

## 2019-06-22 LAB — COMPREHENSIVE METABOLIC PANEL
ALT: 18 IU/L (ref 0–44)
AST: 13 IU/L (ref 0–40)
Albumin/Globulin Ratio: 1.8 (ref 1.2–2.2)
Albumin: 4.6 g/dL (ref 3.8–4.9)
Alkaline Phosphatase: 90 IU/L (ref 39–117)
BUN/Creatinine Ratio: 22 — ABNORMAL HIGH (ref 9–20)
BUN: 24 mg/dL (ref 6–24)
Bilirubin Total: 0.4 mg/dL (ref 0.0–1.2)
CO2: 26 mmol/L (ref 20–29)
Calcium: 9.7 mg/dL (ref 8.7–10.2)
Chloride: 96 mmol/L (ref 96–106)
Creatinine, Ser: 1.11 mg/dL (ref 0.76–1.27)
GFR calc Af Amer: 84 mL/min/{1.73_m2} (ref >59)
GFR calc non Af Amer: 72 mL/min/{1.73_m2} (ref >59)
Globulin, Total: 2.6 g/dL (ref 1.5–4.5)
Glucose: 271 mg/dL — ABNORMAL HIGH (ref 65–99)
Potassium: 3.7 mmol/L (ref 3.5–5.2)
Sodium: 138 mmol/L (ref 134–144)
Total Protein: 7.2 g/dL (ref 6.0–8.5)

## 2019-06-22 NOTE — Progress Notes (Signed)
Good morning.  Please let Rodney Dixon know via phone or letter: - Kidney and liver function remain stable.  - Cholesterol levels remain elevated above goal, places at stroke risk.  I would like to increase your Atorvastatin to 40 MG, if he has 20 MG tablets left you can start taking 2 of these and I will send in new script for 40 MG daily if you agree with this plan.  We need to work on getting these numbers lower.  If any questions let me know and let me know if okay with increase and I will send.  Have a great day!!

## 2019-06-24 ENCOUNTER — Other Ambulatory Visit: Payer: Self-pay | Admitting: Nurse Practitioner

## 2019-06-24 MED ORDER — ATORVASTATIN CALCIUM 40 MG PO TABS: 40.0000 mg | ORAL_TABLET | Freq: Every day | ORAL | 3 refills | Status: DC

## 2019-07-02 DIAGNOSIS — I359 Nonrheumatic aortic valve disorder, unspecified: Secondary | ICD-10-CM | POA: Insufficient documentation

## 2019-07-19 ENCOUNTER — Encounter: Payer: Self-pay | Admitting: Nurse Practitioner

## 2019-07-19 ENCOUNTER — Ambulatory Visit (INDEPENDENT_AMBULATORY_CARE_PROVIDER_SITE_OTHER): Payer: 59 | Admitting: Nurse Practitioner

## 2019-07-19 ENCOUNTER — Other Ambulatory Visit: Payer: Self-pay | Admitting: Nurse Practitioner

## 2019-07-19 ENCOUNTER — Other Ambulatory Visit: Payer: Self-pay

## 2019-07-19 VITALS — BP 124/76 | HR 84 | Temp 99.0°F

## 2019-07-19 DIAGNOSIS — Z1211 Encounter for screening for malignant neoplasm of colon: Secondary | ICD-10-CM | POA: Diagnosis not present

## 2019-07-19 DIAGNOSIS — Z954 Presence of other heart-valve replacement: Secondary | ICD-10-CM

## 2019-07-19 DIAGNOSIS — I482 Chronic atrial fibrillation, unspecified: Secondary | ICD-10-CM

## 2019-07-19 DIAGNOSIS — E1169 Type 2 diabetes mellitus with other specified complication: Secondary | ICD-10-CM | POA: Diagnosis not present

## 2019-07-19 LAB — COAGUCHEK XS/INR WAIVED
INR: 2.9 — ABNORMAL HIGH (ref 0.9–1.1)
Prothrombin Time: 34.6 s

## 2019-07-19 MED ORDER — INSULIN LISPRO 100 UNIT/ML ~~LOC~~ SOLN: SUBCUTANEOUS | 11 refills | Status: DC

## 2019-07-19 NOTE — Assessment & Plan Note (Signed)
Chronic, ongoing. INR today 2.9.  Recommend he continue Coumadin 10 MG every day.  Continue Carvedilol for rate control. Recommend he return yearly for cardiology visits.  Continue to work with CCM team.

## 2019-07-19 NOTE — Telephone Encounter (Signed)
Pharmacy comment: Alternative Requested:NOT COVERED BY INSURANCE, WOULD YOU LIKE TO CONSIDER ALTERNATIVE OF NOVOLOG.

## 2019-07-19 NOTE — Assessment & Plan Note (Signed)
Chronic, ongoing on long term Coumadin.  INR today 2.9.  Continue Coumadin 10 MG daily.  Continue to collaborate with cardiology.  Return in 4 weeks.

## 2019-07-19 NOTE — Assessment & Plan Note (Signed)
Chronic, ongoing with A1C 9.6% recently.  Continue Metformin,  Farxiga, Tresiba, Ozempic, and Humalog.  Recommended he not adjust insulin without first discussing with CCM team or provider.  Focus heavily on diabetic diet and modest weight loss, decreasing carb and sugar intake at home.  Recommend he check BS TID, which he continues not to do regularly.  Will continue to work with CCM team to ensure adherence to medication regimen, which is a major issue.  Return in 2 months for A1C.

## 2019-07-19 NOTE — Progress Notes (Signed)
BP 124/76   Pulse 84   Temp 99 F (37.2 C) (Oral)   SpO2 94%    Subjective:    Patient ID: Rodney Plumber [name], male    DOB: 1960/11/25, 58 y.o.   MRN: [id number]  HPI: Rodney Weirauch. is a 59 y.o. male  Chief Complaint  Patient presents with  . Diabetes   DIABETES Last A1C 9.6% recently, had trended up some from previous of 9%.  Continues on Tresiba, Ozempic (restarted last visit), Humalog, Farxiga, Metformin 1000 MG BID. 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 Hypoglycemic episodes:no Polydipsia/polyuria:no Visual disturbance:no Chest pain:no Paresthesias:no Glucose Monitoring:yes Accucheck frequency: rarely Fasting glucose: 150 - <200 Post prandial: Evening:  Before meals: Taking Insulin?:yes Long acting insulin: Tresiba 50 units Short acting insulin: 24-30 units twice a day Blood Pressure Monitoring:not checking Retinal Examination:Not up to Date Foot Exam:Up to Date Pneumovax:Up to Date Influenza:refused Aspirin:no  ATRIAL FIBRILLATIONWITH METALLIC VALVE Currently takes Coumadin10 mg once a day.[facility], last saw Dr. Ubaldo Dixon 07/02/2019.Last INR 3.1 on 06/21/2019 and today 2.9.  No changes made my cardiology recently. Atrial fibrillation status:stable Satisfied with current treatment:yes Medication side effects:no Medication compliance:good compliance Etiology of atrial fibrillation:  Palpitations:no Chest pain:no Dyspnea on exertion:no Orthopnea:no Syncope:no Edema:no Ventricular rate control:B-blocker Anti-coagulation:warfarin  Relevant past medical, surgical, family and social history reviewed and updated as indicated. Interim medical history since our last visit reviewed. Allergies and medications reviewed and updated.  Review of Systems  Constitutional: Negative for activity  change, diaphoresis, fatigue and fever.  Respiratory: Negative for cough, chest tightness, shortness of breath and wheezing.   Cardiovascular: Negative for chest pain, palpitations and leg swelling.  Gastrointestinal: Negative.   Endocrine: Negative for polydipsia, polyphagia and polyuria.  Neurological: Negative.   Psychiatric/Behavioral: Negative.     Per HPI unless specifically indicated above     Objective:    BP 124/76   Pulse 84   Temp 99 F (37.2 C) (Oral)   SpO2 94%   Wt Readings from Last 3 Encounters:  06/04/19 250 lb (113.4 kg)  05/08/19 252 lb (114.3 kg)  04/25/19 253 lb (114.8 kg)    Physical Exam Vitals and nursing note reviewed.  Constitutional:      General: He is awake. He is not in acute distress.    Appearance: He is well-developed. He is morbidly obese. He is not ill-appearing.  HENT:     Head: Normocephalic and atraumatic.     Right Ear: Hearing normal. No drainage.     Left Ear: Hearing normal. No drainage.  Eyes:     General: Lids are normal.        Right eye: No discharge.        Left eye: No discharge.     Conjunctiva/sclera: Conjunctivae normal.     Pupils: Pupils are equal, round, and reactive to light.  Neck:     Thyroid: No thyromegaly.     Vascular: No carotid bruit.  Cardiovascular:     Rate and Rhythm: Normal rate and regular rhythm.     Heart sounds: Normal heart sounds, S1 normal and S2 normal. No murmur. No gallop.   Pulmonary:     Effort: Pulmonary effort is normal. No accessory muscle usage or respiratory distress.     Breath sounds: Normal breath sounds.  Abdominal:     General: Bowel sounds are normal.     Palpations: Abdomen is soft.  Musculoskeletal:  General: Normal range of motion.     Cervical back: Normal range of motion and neck supple.     Right lower leg: No edema.     Left lower leg: No edema.  Skin:    General: Skin is warm and dry.     Capillary Refill: Capillary refill takes less than 2 seconds.    Neurological:     Mental Status: He is alert and oriented to person, place, and time.     Deep Tendon Reflexes: Reflexes are normal and symmetric.  Psychiatric:        Mood and Affect: Mood normal.        Behavior: Behavior normal. Behavior is cooperative.        Thought Content: Thought content normal.        Judgment: Judgment normal.    Results for orders placed or performed in visit on 07/19/19  CoaguChek XS/INR Waived  Result Value Ref Range   INR 2.9 (H) 0.9 - 1.1   Prothrombin Time 34.6 sec      Assessment & Plan:   Problem List Items Addressed This Visit      Cardiovascular and Mediastinum   Chronic atrial fibrillation (HCC)    Chronic, ongoing. INR today 2.9.  Recommend he continue Coumadin 10 MG every day.  Continue Carvedilol for rate control. Recommend he return yearly for cardiology visits.  Continue to work with CCM team.        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  Continue Metformin,  Farxiga, Tresiba, Ozempic, and Humalog.  Recommended he not adjust insulin without first discussing with CCM team or provider.  Focus heavily on diabetic diet and modest weight loss, decreasing carb and sugar intake at home.  Recommend he check BS TID, which he continues not to do regularly.  Will continue to work with CCM team to ensure adherence to medication regimen, which is a major issue.  Return in 2 months for A1C.      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  INR today 2.9.  Continue Coumadin 10 MG daily.  Continue to collaborate with cardiology.  Return in 4 weeks.       Other Visit Diagnoses    Colon cancer screening       GI referral placed.   Relevant Orders   Ambulatory referral to  Gastroenterology       Follow up plan: Return in about 4 weeks (around 08/16/2019) for INR check.

## 2019-07-19 NOTE — Patient Instructions (Signed)
Carbohydrate Counting for Diabetes Mellitus, Adult  Carbohydrate counting is a method of keeping track of how many carbohydrates you eat. Eating carbohydrates naturally increases the amount of sugar (glucose) in the blood. Counting how many carbohydrates you eat helps keep your blood glucose within normal limits, which helps you manage your diabetes (diabetes mellitus). It is important to know how many carbohydrates you can safely have in each meal. This is different for every person. A diet and nutrition specialist (registered dietitian) can help you make a meal plan and calculate how many carbohydrates you should have at each meal and snack. Carbohydrates are found in the following foods:  Grains, such as breads and cereals.  Dried beans and soy products.  Starchy vegetables, such as potatoes, peas, and corn.  Fruit and fruit juices.  Milk and yogurt.  Sweets and snack foods, such as cake, cookies, candy, chips, and soft drinks. How do I count carbohydrates? There are two ways to count carbohydrates in food. You can use either of the methods or a combination of both. Reading "Nutrition Facts" on packaged food The "Nutrition Facts" list is included on the labels of almost all packaged foods and beverages in the [place] It includes:  The serving size.  Information about nutrients in each serving, including the grams (g) of carbohydrate per serving. To use the "Nutrition Facts":  Decide how many servings you will have.  Multiply the number of servings by the number of carbohydrates per serving.  The resulting number is the total amount of carbohydrates that you will be having. Learning standard serving sizes of other foods When you eat carbohydrate foods that are not packaged or do not include "Nutrition Facts" on the label, you need to measure the servings in order to count the amount of carbohydrates:  Measure the foods that you will eat with a food scale or measuring cup, if  needed.  Decide how many standard-size servings you will eat.  Multiply the number of servings by 15. Most carbohydrate-rich foods have about 15 g of carbohydrates per serving. ? For example, if you eat 8 oz (170 g) of strawberries, you will have eaten 2 servings and 30 g of carbohydrates (2 servings x 15 g = 30 g).  For foods that have more than one food mixed, such as soups and casseroles, you must count the carbohydrates in each food that is included. The following list contains standard serving sizes of common carbohydrate-rich foods. Each of these servings has about 15 g of carbohydrates:   hamburger bun or  English muffin.   oz (15 mL) syrup.   oz (14 g) jelly.  1 slice of bread.  1 six-inch tortilla.  3 oz (85 g) cooked rice or pasta.  4 oz (113 g) cooked dried beans.  4 oz (113 g) starchy vegetable, such as peas, corn, or potatoes.  4 oz (113 g) hot cereal.  4 oz (113 g) mashed potatoes or  of a large baked potato.  4 oz (113 g) canned or frozen fruit.  4 oz (120 mL) fruit juice.  4-6 crackers.  6 chicken nuggets.  6 oz (170 g) unsweetened dry cereal.  6 oz (170 g) plain fat-free yogurt or yogurt sweetened with artificial sweeteners.  8 oz (240 mL) milk.  8 oz (170 g) fresh fruit or one small piece of fruit.  24 oz (680 g) popped popcorn. Example of carbohydrate counting Sample meal  3 oz (85 g) chicken breast.  6 oz (170 g)   brown rice.  4 oz (113 g) corn.  8 oz (240 mL) milk.  8 oz (170 g) strawberries with sugar-free whipped topping. Carbohydrate calculation 1. Identify the foods that contain carbohydrates: ? Rice. ? Corn. ? Milk. ? Strawberries. 2. Calculate how many servings you have of each food: ? 2 servings rice. ? 1 serving corn. ? 1 serving milk. ? 1 serving strawberries. 3. Multiply each number of servings by 15 g: ? 2 servings rice x 15 g = 30 g. ? 1 serving corn x 15 g = 15 g. ? 1 serving milk x 15 g = 15 g. ? 1  serving strawberries x 15 g = 15 g. 4. Add together all of the amounts to find the total grams of carbohydrates eaten: ? 30 g + 15 g + 15 g + 15 g = 75 g of carbohydrates total. Summary  Carbohydrate counting is a method of keeping track of how many carbohydrates you eat.  Eating carbohydrates naturally increases the amount of sugar (glucose) in the blood.  Counting how many carbohydrates you eat helps keep your blood glucose within normal limits, which helps you manage your diabetes.  A diet and nutrition specialist (registered dietitian) can help you make a meal plan and calculate how many carbohydrates you should have at each meal and snack. This information is not intended to replace advice given to you by your health care provider. Make sure you discuss any questions you have with your health care provider. Document Revised: 11/17/2016 Document Reviewed: 10/07/2015 Elsevier Patient Education  2020 Elsevier Inc.  

## 2019-07-22 ENCOUNTER — Telehealth: Payer: Self-pay | Admitting: Pharmacy Technician

## 2019-07-22 NOTE — Telephone Encounter (Signed)
Patient has prescription drug coverage with [facility].  No longer meets the eligibility criteria for [facility].  Patient notified.  Sherilyn Dacosta Care Manager [facility]

## 2019-07-22 NOTE — Telephone Encounter (Signed)
Routing to provider to advise of medication change.

## 2019-07-23 ENCOUNTER — Other Ambulatory Visit: Payer: Self-pay

## 2019-07-23 ENCOUNTER — Ambulatory Visit: Payer: Self-pay | Admitting: Pharmacist

## 2019-07-23 ENCOUNTER — Other Ambulatory Visit: Payer: Self-pay | Admitting: Nurse Practitioner

## 2019-07-23 ENCOUNTER — Telehealth: Payer: Self-pay

## 2019-07-23 DIAGNOSIS — E1169 Type 2 diabetes mellitus with other specified complication: Secondary | ICD-10-CM

## 2019-07-23 DIAGNOSIS — Z800 Family history of malignant neoplasm of digestive organs: Secondary | ICD-10-CM

## 2019-07-23 DIAGNOSIS — E1159 Type 2 diabetes mellitus with other circulatory complications: Secondary | ICD-10-CM

## 2019-07-23 DIAGNOSIS — Z954 Presence of other heart-valve replacement: Secondary | ICD-10-CM

## 2019-07-23 DIAGNOSIS — Z1211 Encounter for screening for malignant neoplasm of colon: Secondary | ICD-10-CM

## 2019-07-23 DIAGNOSIS — E785 Hyperlipidemia, unspecified: Secondary | ICD-10-CM

## 2019-07-23 DIAGNOSIS — I482 Chronic atrial fibrillation, unspecified: Secondary | ICD-10-CM

## 2019-07-23 MED ORDER — FREESTYLE LIBRE 14 DAY SENSOR MISC: 3 refills | Status: DC

## 2019-07-23 NOTE — Telephone Encounter (Signed)
Gastroenterology Pre-Procedure Review  Request Date: Monday 08/19/19 Requesting Physician: Dr. Tobi Bastos  PATIENT REVIEW QUESTIONS: The patient responded to the following health history questions as indicated:    1. Are you having any GI issues? no 2. Do you have a personal history of Polyps? no 3. Do you have a family history of Colon Cancer or Polyps? yes (father colon cancer) 4. Diabetes Mellitus? no 5. Joint replacements in the past 12 months?no 6. Major health problems in the past 3 months?no 7. 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 yes (Coumadin blood thinner request sent to [facility]) Aspirin? no  Patient confirms/reports the following medications:  Current Outpatient Medications  Medication Sig Dispense Refill  . atorvastatin (LIPITOR) 40 MG tablet Take 1 tablet (40 mg total) by mouth daily. 90 tablet 3  . benazepril (LOTENSIN) 40 MG tablet Take 1 tablet (40 mg total) by mouth daily. 90 tablet 3  . carvedilol (COREG) 25 MG tablet Take 1 tablet (25 mg total) by mouth 2 (two) times daily with a meal. 180 tablet 0  . dapagliflozin propanediol (FARXIGA) 10 MG TABS tablet Take 10 mg by mouth daily. 90 tablet 3  . fenofibrate (TRICOR) 145 MG tablet Take 1 tablet (145 mg total) by mouth daily. 90 tablet 3  . Glucose Blood (BLOOD GLUCOSE TEST STRIPS) STRP 1 strip by In Vitro route daily. 100 each 12  . hydrochlorothiazide (HYDRODIURIL) 25 MG tablet Take 1 tablet (25 mg total) by mouth daily. 90 tablet 3  . insulin aspart (NOVOLOG) 100 UNIT/ML injection Inject 50 Units into the skin 2 (two) times daily. 10 mL 5  . insulin degludec (TRESIBA FLEXTOUCH) 100 UNIT/ML SOPN FlexTouch Pen Inject 0.25 mLs (25 Units total) into the skin daily. 3 pen 12  . metFORMIN (GLUCOPHAGE)  500 MG tablet TAKE 2 TABLETS BY MOUTH TWICE A DAY 360 tablet 3  . Semaglutide,0.25 or 0.5MG /DOS, (OZEMPIC, 0.25 OR 0.5 MG/DOSE,) 2 MG/1.5ML SOPN Inject 0.25 mg into the skin once a week. Start with 0.25MG  once a week x 4 weeks, then increase to 0.5MG  weekly. 1.5 mL 6  . warfarin (COUMADIN) 10 MG tablet Take 1 tablet (10 mg total) by mouth daily. Take as directed 90 tablet 1  . warfarin (COUMADIN) 2 MG tablet Take 1 tab in combination with the 10 mg coumadin on Tuesdays and Thursdays (Patient not taking: Reported on 06/12/2019) 30 tablet 0  . warfarin (COUMADIN) 4 MG tablet Take 1 tablet (4 mg total) by mouth as directed. (Patient not taking: Reported on 07/19/2019) 90 tablet 0  . warfarin (COUMADIN) 5 MG tablet Take 1 tablet (5 mg total) by mouth as directed. (Patient not taking: Reported on 07/19/2019) 90 tablet 0   No current facility-administered medications for this visit.    Patient confirms/reports the following allergies:  No Known Allergies  No orders of the defined types were placed in this encounter.   AUTHORIZATION INFORMATION Primary Insurance: 1D#: Group #:  Secondary Insurance: 1D#: Group #:  SCHEDULE INFORMATION: Date: Monday 08/19/19 Time: Location:[facility]

## 2019-07-23 NOTE — Patient Instructions (Signed)
Visit Information  Goals Addressed            This Visit's Progress     Patient Stated   . PharmD "I need to work on diabetes" (pt-stated)       CARE PLAN ENTRY (see longtitudinal plan of care for additional care plan information)  Current Barriers:  . Diabetes: uncontrolled; complicated by chronic medical conditions including HTN, HLD, afib, most recent A1c 9.6% . Current antihyperglycemic regimen: metformin 1000 mg BID, Tresiba 50 units QAM, Novolog 25 units TID w/ meals, Farxiga 10 mg daily, Ozempic 0.25 mg once weekly (has not completed full 4 weeks yet) . Current blood glucose readings:  o Fasting: reports lowest is 150, usually in 200s; though reports he doesn't check as often as he should o 2 hours after meals: not checking . NO hypoglycemia since recent insulin adjustments  . Cardiovascular risk reduction (Dr. Lady Gary) o Current hypertensive regimen: carvedilol 25 mg BID, HCTZ 25 mg daily; benazepril 40 mg daily; BP at goal at recent provider visit since restarting benazepril o Current hyperlipidemia regimen: atorvastatin 40 mg (just increased to 40 mg from 20 mg in February), fenofibrate 145 mg daily o Current antiplatelet regimen: none; warfarin for afib + mitral aortic valve replacement; goal 2.5-3.5  Pharmacist Clinical Goal(s):  Marland Kitchen Over the next 90 days, patient will work with PharmD and primary care provider to address optimized glycemic control  Interventions: . Comprehensive medication review performed, medication list updated in electronic medical record . Reviewed insurance preferred switch from Humalog to Novolog. Educated on similarity. Patient will start Novolog when current supply of Humalog is complete.  . Discussed completing 4 weeks of Ozempic 0.25 mg then increasing to Ozempic 0.5 mg. Patient verbalized understanding . Discussed potential of pursuing coverage for FreeStyle Libre CGM. 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 Will collaborate w/ [employer] to send prescription for 14 day sensor (patient does not need reader because he has a smart phone). Will see if covered on insurance . Likely will need increase in [place] dose moving forward.   Patient Self Care Activities:  . Patient will check blood glucose BID , document, and provide at future appointments . Patient will take medications as prescribed . Patient will report any questions or concerns to provider   Please see past updates related to this goal by clicking on the "Past Updates" button in the selected goal         Patient verbalizes understanding of instructions provided today.    Plan:  - Scheduled f/u appt 08/16/19.   Catie Feliz Beam, PharmD, [facility] Clinical Pharmacist [facility] [phone]

## 2019-07-23 NOTE — Chronic Care Management (AMB) (Signed)
Chronic Care Management   Follow Up Note   07/23/2019 Name: Rodney MEIR [name] MRN: [id number] DOB: [birth year]  Referred by: Steele Sizer, MD Reason for referral : Chronic Care Management (Medication Management)   Rodney Chary [name] is a 59 y.o. year old male who is a primary care patient of Crissman, Redge Gainer, MD. The CCM team was consulted for assistance with chronic disease management and care coordination needs.    Contacted patient for medication management review.  Review of patient status, including review of consultants reports, relevant laboratory and other test results, and collaboration with appropriate care team members and the patient's provider was performed as part of comprehensive patient evaluation and provision of chronic care management services.    SDOH (Social Determinants of Health) assessments performed: Yes See Care Plan activities for detailed interventions related to [facility])     Outpatient Encounter Medications as of 07/23/2019  Medication Sig Note  . benazepril (LOTENSIN) 40 MG tablet Take 1 tablet (40 mg total) by mouth daily.   . carvedilol (COREG) 25 MG tablet Take 1 tablet (25 mg total) by mouth 2 (two) times daily with a meal.   . dapagliflozin propanediol (FARXIGA) 10 MG TABS tablet Take 10 mg by mouth daily.   . hydrochlorothiazide (HYDRODIURIL) 25 MG tablet Take 1 tablet (25 mg total) by mouth daily.   . insulin aspart (NOVOLOG) 100 UNIT/ML injection Inject 50 Units into the skin 2 (two) times daily. 07/23/2019: 24 units  . insulin degludec (TRESIBA FLEXTOUCH) 100 UNIT/ML SOPN FlexTouch Pen Inject 0.25 mLs (25 Units total) into the skin daily. 07/23/2019: Taking 50 units QAM  . metFORMIN (GLUCOPHAGE) 500 MG tablet TAKE 2 TABLETS BY MOUTH TWICE A DAY   . Semaglutide,0.25 or 0.5MG /DOS, (OZEMPIC, 0.25 OR 0.5 MG/DOSE,) 2 MG/1.5ML SOPN Inject 0.25 mg into the skin once a week. Start with 0.25MG  once a week x 4 weeks, then increase to 0.5MG  weekly.   Marland Kitchen  atorvastatin (LIPITOR) 40 MG tablet Take 1 tablet (40 mg total) by mouth daily.   . fenofibrate (TRICOR) 145 MG tablet Take 1 tablet (145 mg total) by mouth daily.   . Glucose Blood (BLOOD GLUCOSE TEST STRIPS) STRP 1 strip by In Vitro route daily.   Marland Kitchen warfarin (COUMADIN) 10 MG tablet Take 1 tablet (10 mg total) by mouth daily. Take as directed   . warfarin (COUMADIN) 2 MG tablet Take 1 tab in combination with the 10 mg coumadin on Tuesdays and Thursdays (Patient not taking: Reported on 06/12/2019)   . warfarin (COUMADIN) 4 MG tablet Take 1 tablet (4 mg total) by mouth as directed. (Patient not taking: Reported on 07/19/2019)   . warfarin (COUMADIN) 5 MG tablet Take 1 tablet (5 mg total) by mouth as directed. (Patient not taking: Reported on 07/19/2019)    No facility-administered encounter medications on file as of 07/23/2019.     Objective:   Goals Addressed            This Visit's Progress     Patient Stated   . PharmD "I need to work on diabetes" (pt-stated)       CARE PLAN ENTRY (see longtitudinal plan of care for additional care plan information)  Current Barriers:  . Diabetes: uncontrolled; complicated by chronic medical conditions including HTN, HLD, afib, most recent A1c 9.6% . Current antihyperglycemic regimen: metformin 1000 mg BID, Tresiba 50 units QAM, Novolog 25 units TID w/ meals, Farxiga 10 mg daily, Ozempic 0.25 mg once weekly (has  not completed full 4 weeks yet) . Current blood glucose readings:  o Fasting: reports lowest is 150, usually in 200s; though reports he doesn't check as often as he should o 2 hours after meals: not checking . NO hypoglycemia since recent insulin adjustments  . Cardiovascular risk reduction (Dr. Ubaldo Glassing) o Current hypertensive regimen: carvedilol 25 mg BID, HCTZ 25 mg daily; benazepril 40 mg daily; BP at goal at recent provider visit since restarting benazepril o Current hyperlipidemia regimen: atorvastatin 40 mg (just increased to 40 mg from 20  mg in February), fenofibrate 145 mg daily o Current antiplatelet regimen: none; warfarin for afib + mitral aortic valve replacement; goal 2.5-3.5  Pharmacist Clinical Goal(s):  Marland Kitchen Over the next 90 days, patient will work with PharmD and primary care provider to address optimized glycemic control  Interventions: . Comprehensive medication review performed, medication list updated in electronic medical record . Reviewed insurance preferred switch from Humalog to Novolog. Educated on similarity. Patient will start Novolog when current supply of Humalog is complete.  . Discussed completing 4 weeks of Ozempic 0.25 mg then increasing to Ozempic 0.5 mg. Patient verbalized understanding . Discussed potential of pursuing coverage for FreeStyle Libre CGM. 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 Will collaborate w/ Marnee Guarneri to send prescription for 14 day sensor (patient does not need reader because he has a smart phone). Will see if covered on insurance . Likely will need increase in [place] dose moving forward.   Patient Self Care Activities:  . Patient will check blood glucose BID , document, and provide at future appointments . Patient will take medications as prescribed . Patient will report any questions or concerns to provider   Please see past updates related to this goal by clicking on the "Past Updates" button in the selected goal          Plan:  - Scheduled f/u appt 08/16/19.   Catie Darnelle Maffucci, PharmD, [place] [phone]

## 2019-07-24 ENCOUNTER — Ambulatory Visit: Payer: Self-pay | Admitting: Pharmacist

## 2019-07-24 ENCOUNTER — Other Ambulatory Visit: Payer: Self-pay | Admitting: Nurse Practitioner

## 2019-07-24 DIAGNOSIS — E1169 Type 2 diabetes mellitus with other specified complication: Secondary | ICD-10-CM

## 2019-07-24 MED ORDER — FREESTYLE LIBRE 14 DAY SENSOR MISC: 3 refills | Status: DC

## 2019-07-24 NOTE — Chronic Care Management (AMB) (Signed)
Chronic Care Management   Follow Up Note   07/24/2019 Name: Rodney MARQUES [name] MRN: [id number] DOB: Jan 03, 1961  Referred by: Guadalupe Maple, MD Reason for referral : Chronic Care Management (Medication Management)   Rodney Plumber [name] is a 59 y.o. year old male who is a primary care patient of Crissman, Jeannette How, MD. The CCM team was consulted for assistance with chronic disease management and care coordination needs.    Care coordination completed today.   Review of patient status, including review of consultants reports, relevant laboratory and other test results, and collaboration with appropriate care team members and the patient's provider was performed as part of comprehensive patient evaluation and provision of chronic care management services.    SDOH (Social Determinants of Health) assessments performed: Yes See Care Plan activities for detailed interventions related to [facility])     Outpatient Encounter Medications as of 07/24/2019  Medication Sig Note  . atorvastatin (LIPITOR) 40 MG tablet Take 1 tablet (40 mg total) by mouth daily.   . benazepril (LOTENSIN) 40 MG tablet Take 1 tablet (40 mg total) by mouth daily.   . carvedilol (COREG) 25 MG tablet Take 1 tablet (25 mg total) by mouth 2 (two) times daily with a meal.   . Continuous Blood Gluc Sensor (FREESTYLE LIBRE 14 DAY SENSOR) MISC To check blood sugar four times a day.   . dapagliflozin propanediol (FARXIGA) 10 MG TABS tablet Take 10 mg by mouth daily.   . fenofibrate (TRICOR) 145 MG tablet Take 1 tablet (145 mg total) by mouth daily.   . Glucose Blood (BLOOD GLUCOSE TEST STRIPS) STRP 1 strip by In Vitro route daily.   . hydrochlorothiazide (HYDRODIURIL) 25 MG tablet Take 1 tablet (25 mg total) by mouth daily.   . insulin aspart (NOVOLOG) 100 UNIT/ML injection Inject 50 Units into the skin 2 (two) times daily. 07/23/2019: 24 units  . insulin degludec (TRESIBA FLEXTOUCH) 100 UNIT/ML SOPN FlexTouch Pen Inject 0.25 mLs (25  Units total) into the skin daily. 07/23/2019: Taking 50 units QAM  . metFORMIN (GLUCOPHAGE) 500 MG tablet TAKE 2 TABLETS BY MOUTH TWICE A DAY   . Semaglutide,0.25 or 0.5MG /DOS, (OZEMPIC, 0.25 OR 0.5 MG/DOSE,) 2 MG/1.5ML SOPN Inject 0.25 mg into the skin once a week. Start with 0.25MG  once a week x 4 weeks, then increase to 0.5MG  weekly.   Marland Kitchen warfarin (COUMADIN) 10 MG tablet Take 1 tablet (10 mg total) by mouth daily. Take as directed   . warfarin (COUMADIN) 2 MG tablet Take 1 tab in combination with the 10 mg coumadin on Tuesdays and Thursdays (Patient not taking: Reported on 06/12/2019)   . warfarin (COUMADIN) 4 MG tablet Take 1 tablet (4 mg total) by mouth as directed. (Patient not taking: Reported on 07/19/2019)   . warfarin (COUMADIN) 5 MG tablet Take 1 tablet (5 mg total) by mouth as directed. (Patient not taking: Reported on 07/19/2019)    No facility-administered encounter medications on file as of 07/24/2019.     Objective:   Goals Addressed            This Visit's Progress     Patient Stated   . PharmD "I need to work on diabetes" (pt-stated)       [place] (see longtitudinal plan of care for additional care plan information)  Current Barriers:  . Diabetes: uncontrolled; complicated by chronic medical conditions including HTN, HLD, afib, most recent A1c 9.6% o Decided to see if FreeStyle Elenor Legato is  covered on his insurance plan . Current antihyperglycemic regimen: metformin 1000 mg BID, Tresiba 50 units QAM, Novolog 25 units TID w/ meals, Farxiga 10 mg daily, Ozempic 0.25 mg once weekly (has not completed full 4 weeks yet) . NO hypoglycemia since recent insulin adjustments  . Cardiovascular risk reduction (Dr. Lady Gary) o Current hypertensive regimen: carvedilol 25 mg BID, HCTZ 25 mg daily; benazepril 40 mg daily; BP at goal at recent provider visit since restarting benazepril o Current hyperlipidemia regimen: atorvastatin 40 mg (just increased to 40 mg from 20 mg in February),  fenofibrate 145 mg daily o Current antiplatelet regimen: none; warfarin for afib + mitral aortic valve replacement; goal 2.5-3.5  Pharmacist Clinical Goal(s):  Marland Kitchen Over the next 90 days, patient will work with PharmD and primary care provider to address optimized glycemic control  Interventions: . Contacted [facility]. Covered on his plan, price for 1 sensor is $19.99. Contacted patient, reviewed this. He is interested in trying. He will pick up the sensors, download the LibreLink app to use as the reader, and utilize CGM.  Marland Kitchen Sent MyChart activation link so that we can more easily communicate. Once downloaded, I will help review principals of CGM (scanning at least QID) and [place] to link w/ my LibreView patient portal.   Patient Self Care Activities:  . Patient will check blood glucose BID , document, and provide at future appointments . Patient will take medications as prescribed . Patient will report any questions or concerns to provider   Please see past updates related to this goal by clicking on the "Past Updates" button in the selected goal          Plan:  - Will collaborate w/ patient as above  Catie Feliz Beam, PharmD, [facility] Clinical Pharmacist [facility] [phone]

## 2019-07-24 NOTE — Patient Instructions (Signed)
Visit Information  Goals Addressed            This Visit's Progress     Patient Stated   . PharmD "I need to work on diabetes" (pt-stated)       CARE PLAN ENTRY (see longtitudinal plan of care for additional care plan information)  Current Barriers:  . Diabetes: uncontrolled; complicated by chronic medical conditions including HTN, HLD, afib, most recent A1c 9.6% o Decided to see if FreeStyle Josephine Igo is covered on his insurance plan . Current antihyperglycemic regimen: metformin 1000 mg BID, Tresiba 50 units QAM, Novolog 25 units TID w/ meals, Farxiga 10 mg daily, Ozempic 0.25 mg once weekly (has not completed full 4 weeks yet) . NO hypoglycemia since recent insulin adjustments  . Cardiovascular risk reduction (Dr. Lady Gary) o Current hypertensive regimen: carvedilol 25 mg BID, HCTZ 25 mg daily; benazepril 40 mg daily; BP at goal at recent provider visit since restarting benazepril o Current hyperlipidemia regimen: atorvastatin 40 mg (just increased to 40 mg from 20 mg in February), fenofibrate 145 mg daily o Current antiplatelet regimen: none; warfarin for afib + mitral aortic valve replacement; goal 2.5-3.5  Pharmacist Clinical Goal(s):  Marland Kitchen Over the next 90 days, patient will work with PharmD and primary care provider to address optimized glycemic control  Interventions: . [facility] Covered on [facility] plan, price for 1 sensor is $19.99. Contacted patient, reviewed this. He is interested in trying. He will pick up the sensors, download the LibreLink app to use as the reader, and utilize CGM.  Marland Kitchen Sent MyChart activation link so that we can more easily communicate. Once downloaded, I will help review principals of CGM (scanning at least QID) and how to link w/ my LibreView patient portal.  . Will collaborate w/ Aura Dials to resend prescription for quality 2 (1 sensor for 14 days, so 2 is a 28 day supply)  Patient Self Care Activities:  . Patient will check blood glucose BID , document,  and provide at future appointments . Patient will take medications as prescribed . Patient will report any questions or concerns to provider   Please see past updates related to this goal by clicking on the "Past Updates" button in the selected goal         Patient verbalizes understanding of instructions provided today.   Plan:  - Will collaborate w/ patient as above  Catie Feliz Beam, PharmD, [facility] Clinical Pharmacist [facility] [phone]

## 2019-07-25 ENCOUNTER — Other Ambulatory Visit: Payer: Self-pay | Admitting: Nurse Practitioner

## 2019-07-25 ENCOUNTER — Ambulatory Visit: Payer: Self-pay | Admitting: Pharmacist

## 2019-07-25 ENCOUNTER — Encounter: Payer: Self-pay | Admitting: Pharmacist

## 2019-07-25 MED ORDER — INSULIN ASPART 100 UNIT/ML FLEXPEN: 50.0000 [IU] | PEN_INJECTOR | Freq: Two times a day (BID) | SUBCUTANEOUS | 11 refills | Status: DC

## 2019-07-25 NOTE — Chronic Care Management (AMB) (Signed)
  Chronic Care Management   Note  07/25/2019 Name: JONATHANDAVID MARLETT [name] MRN: [id number] DOB: Jul 17, 1960  [name] is a 59 y.o. year old male who is a primary care patient of [facility], Redge Gainer, MD. The CCM team was consulted for assistance with chronic disease management and care coordination needs.    Received call from patient. He notes that the Novolog prescription was sent in for Vials. Will collaborate w/ Aura Dials to resend for [employer]. Will also send patient a MyChart message about [employer] use.   Catie Feliz Beam, PharmD, [facility] Clinical Pharmacist [facility] [phone]

## 2019-07-26 ENCOUNTER — Telehealth: Payer: Self-pay

## 2019-07-26 NOTE — Telephone Encounter (Signed)
Prior Authorization initiated via CoverMyMeds for Farxiga 10MG  tablets Key: 

## 2019-07-26 NOTE — Telephone Encounter (Signed)
PA Approved

## 2019-07-30 ENCOUNTER — Other Ambulatory Visit: Payer: Self-pay | Admitting: Nurse Practitioner

## 2019-07-30 ENCOUNTER — Other Ambulatory Visit: Payer: Self-pay | Admitting: Family Medicine

## 2019-07-30 ENCOUNTER — Telehealth: Payer: Self-pay | Admitting: Nurse Practitioner

## 2019-07-30 DIAGNOSIS — E1169 Type 2 diabetes mellitus with other specified complication: Secondary | ICD-10-CM

## 2019-07-30 MED ORDER — WARFARIN SODIUM 10 MG PO TABS: 10.0000 mg | ORAL_TABLET | Freq: Every day | ORAL | 3 refills | Status: DC

## 2019-07-30 MED ORDER — TRESIBA FLEXTOUCH 100 UNIT/ML ~~LOC~~ SOPN: 25.0000 [IU] | PEN_INJECTOR | Freq: Every day | SUBCUTANEOUS | 12 refills | Status: DC

## 2019-07-30 NOTE — Telephone Encounter (Signed)
Refills sent in

## 2019-07-30 NOTE — Telephone Encounter (Signed)
Routing to provider  

## 2019-07-30 NOTE — Telephone Encounter (Signed)
LOV: 07/19/2019, NOV: 08/16/2019

## 2019-07-30 NOTE — Telephone Encounter (Signed)
RX REFILL Medication warfarin (COUMADIN) 2 MG tablet [8749] Patient states he need 10mg   warfarin (COUMADIN) 10 MG tablet   insulin degludec (TRESIBA FLEXTOUCH) 100 UNIT/ML SOPN FlexTouch Pen  metFORMIN (GLUCOPHAGE) 500 MG tablet  atorvastatin (LIPITOR) 40 MG tablet  PHARM[facility]Y [id number] [place], [place] - [address] Phone:  [phone]  Fax:  [phone]

## 2019-07-30 NOTE — Telephone Encounter (Signed)
Requested medication (s) are due for refill today: Yes  Requested medication (s) are on the active medication list: Yes  Last refill:  04/25/19  Future visit scheduled: Yes  Notes to clinic:  See request.    Requested Prescriptions  Pending Prescriptions Disp Refills   warfarin (COUMADIN) 2 MG tablet [Pharmacy Med Name: WARFARIN SODIUM 2 MG TABLET] 30 tablet 0    Sig: TAKE 1 TABLET BY MOUTH WITH THE 10 MG TABLET ON TUESDAYS AND THURSDAYS      Hematology:  Anticoagulants - warfarin Failed - 07/30/2019 11:14 AM      Failed - This refill cannot be delegated      Failed - If the patient is managed by Coumadin Clinic - route to their Pool. If not, forward to the provider.      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

## 2019-07-31 NOTE — Telephone Encounter (Signed)
Patient notified and verbalized understanding. 

## 2019-08-05 ENCOUNTER — Telehealth: Payer: Self-pay

## 2019-08-05 ENCOUNTER — Telehealth: Payer: Self-pay | Admitting: Nurse Practitioner

## 2019-08-05 NOTE — Telephone Encounter (Signed)
Patient has been advised per Dr. Harvest Dark Blood Thinner request received on 07/29/19 to stop Coumadin 5 days before procedure and that he will be bridged with lovenox due to his higher risk.  Patient has been asked to contact Dr. Carren Rang office in regards to his questions on what this is and how it is to be done.  Thanks,  [place], [place]

## 2019-08-05 NOTE — Telephone Encounter (Signed)
Patient notified that he will have to pay out of pocket for the medication since 90 was picked up 06/18/19

## 2019-08-05 NOTE — Telephone Encounter (Signed)
warfarin (COUMADIN) 10 MG tablet     Patient requesting refill- he states pharmacy filled 2mg  instead of 10MG .    Ph[facility]acy:  [id number] [place], [place] - [address] Phone:  [phone]  Fax:  [phone]

## 2019-08-05 NOTE — Telephone Encounter (Signed)
[facility], pharmacist stated that pt picked up refill of 10 mg tablets on 06/18/19 #90. Prescription sent on 07/30/19 # 90 was received. Was rejected by insurance due to having recent refill. Attempted to call pt to discuss, but VM not set up and unable to LM.

## 2019-08-06 ENCOUNTER — Telehealth: Payer: Self-pay | Admitting: Nurse Practitioner

## 2019-08-06 MED ORDER — ENOXAPARIN SODIUM 100 MG/ML ~~LOC~~ SOLN: 100.0000 mg | Freq: Two times a day (BID) | SUBCUTANEOUS | 0 refills | Status: DC

## 2019-08-06 NOTE — Telephone Encounter (Signed)
Spoke to patient on telephone about upcoming colonoscopy and need to bridge Coumadin with Lovenox due to bleeding risk.  Discussed case with Vanice Sarah PharmD on CCM team, will have him hold Coumadin starting 08/14/2019, his colonoscopy is 08/19/2019.  His last dose of Coumadin will be evening of 08/13/2019.  Will start Lovenox on 08/16/2019, he has office visit with PCP that day.  Lovenox 100 MG BID ordered to pharmacy, to start 08/16/2019 if INR <2.5.  Will have him take AM Lovenox dose early on 08/18/2019, around 9 am, and that will be last dose until procedure 08/19/2019.  Will overlap Lovenox and Coumadin post procedure and see back in office 08/22/2019 for INR check, discontinue Lovenox at that time if INR at goal.  He was able to verbalize this plan back to provider.

## 2019-08-06 NOTE — Addendum Note (Signed)
Addended by: Aura Dials T on: 08/06/2019 05:25 PM   Modules accepted: Orders

## 2019-08-07 NOTE — Telephone Encounter (Signed)
Noted  

## 2019-08-07 NOTE — Telephone Encounter (Signed)
Called pt set up lab appt and scheduled virtual for 08/22/19

## 2019-08-15 ENCOUNTER — Other Ambulatory Visit
Admission: RE | Admit: 2019-08-15 | Discharge: 2019-08-15 | Disposition: A | Discharge status: Home / Self Care | Payer: 59 | Source: Ambulatory Visit | Attending: Gastroenterology | Admitting: Gastroenterology

## 2019-08-15 ENCOUNTER — Telehealth: Payer: Self-pay | Admitting: Nurse Practitioner

## 2019-08-15 ENCOUNTER — Telehealth: Payer: Self-pay | Admitting: Family Medicine

## 2019-08-15 ENCOUNTER — Other Ambulatory Visit: Payer: Self-pay | Admitting: Nurse Practitioner

## 2019-08-15 DIAGNOSIS — Z01812 Encounter for preprocedural laboratory examination: Secondary | ICD-10-CM | POA: Diagnosis present

## 2019-08-15 DIAGNOSIS — Z20822 Contact with and (suspected) exposure to covid-19: Secondary | ICD-10-CM | POA: Insufficient documentation

## 2019-08-15 LAB — SARS CORONAVIRUS 2 (TAT 6-24 HRS): SARS Coronavirus 2: NEGATIVE

## 2019-08-15 MED ORDER — ENOXAPARIN SODIUM 100 MG/ML ~~LOC~~ SOLN: 100.0000 mg | Freq: Two times a day (BID) | SUBCUTANEOUS | 0 refills | Status: DC

## 2019-08-15 NOTE — Telephone Encounter (Signed)
This is from my recent telephone note with him.  I have resent Lovenox, but he should not start that until he sees me tomorrow, we need to make sure INR <2.5.  He can pick up though and can instruct him on how to give tomorrow in office if he brings it with him:  Discussed case with Vanice Sarah PharmD on CCM team, will have him hold Coumadin starting 08/14/2019, his colonoscopy is 08/19/2019.  His last dose of Coumadin will be evening of 08/13/2019.  Will start Lovenox on 08/16/2019, he has office visit with PCP that day.  Lovenox 100 MG BID ordered to pharmacy, to start 08/16/2019 if INR <2.5.  Will have him take AM Lovenox dose early on 08/18/2019, around 9 am, and that will be last dose until procedure 08/19/2019.  Will overlap Lovenox and Coumadin post procedure and see back in office 08/22/2019 for INR check, discontinue Lovenox at that time if INR at goal.

## 2019-08-15 NOTE — Telephone Encounter (Signed)
erroneous error  

## 2019-08-15 NOTE — Telephone Encounter (Signed)
Pt has question on medication stated provider Cannady had told him to not take his coumadin  after 08/14/2019 and that he was suppose to have a medication that he injects instead pt could not remember name of medication or what it was he stated pharmacy does not have a RX for any new medication, Pt is unsure what he is suppose to do since today he has not taken coumadin and tomorrow sees provider in the afternoon. Please advise.

## 2019-08-15 NOTE — Telephone Encounter (Signed)
Routing to provider  

## 2019-08-15 NOTE — Telephone Encounter (Signed)
Called and spoke to patient. Let him know about medications and Jolene's message. Patient is bringing medication to his appointment tomorrow.

## 2019-08-16 ENCOUNTER — Telehealth: Payer: Self-pay

## 2019-08-16 ENCOUNTER — Encounter: Payer: Self-pay | Admitting: Nurse Practitioner

## 2019-08-16 ENCOUNTER — Encounter: Payer: Self-pay | Admitting: Gastroenterology

## 2019-08-16 ENCOUNTER — Ambulatory Visit (INDEPENDENT_AMBULATORY_CARE_PROVIDER_SITE_OTHER): Payer: 59 | Admitting: Nurse Practitioner

## 2019-08-16 ENCOUNTER — Other Ambulatory Visit: Payer: Self-pay

## 2019-08-16 VITALS — BP 122/82 | HR 81 | Temp 97.8°F | Wt 262.0 lb

## 2019-08-16 DIAGNOSIS — Z954 Presence of other heart-valve replacement: Secondary | ICD-10-CM

## 2019-08-16 LAB — COAGUCHEK XS/INR WAIVED
INR: 1.6 — ABNORMAL HIGH (ref 0.9–1.1)
Prothrombin Time: 19.7 s

## 2019-08-16 NOTE — Progress Notes (Signed)
   BP 122/82   Pulse 81   Temp 97.8 F (36.6 C) (Oral)   Wt 262 lb (118.8 kg)   SpO2 93%   BMI 39.84 kg/m    Subjective:    Patient ID: Rodney Chary [name], male    DOB: Nov 09, 1960, 59 y.o.   MRN: [id number]  CC: Coumadin management  HPI: This patient is a59 y.o.malewho presents for coumadin management. The expected duration of coumadin treatment islifelongThe reason for anticoagulation is mechanical heart valve.INR previous 2.9 and today 1.6, which is appropriate as are going to bridge him with Lovenox due to upcoming colonoscopy.    Discussed case with Vanice Sarah PharmD on CCM team, will have him hold Coumadin starting 08/14/2019, his colonoscopy is 08/19/2019. His last dose of Coumadin will be evening of 08/13/2019. Will start Lovenox on 08/16/2019, he has office visit with PCP that day. Lovenox 100 MG BID ordered to pharmacy, to start 08/16/2019 if INR <2.5. Will have him take AM Lovenox dose early on 08/18/2019, around 9 am, and that will be last dose until procedure 08/19/2019. Will overlap Lovenox and Coumadin post procedure and see back in office 08/22/2019 for INR check, discontinue Lovenox at that time if INR at goal.  Present Coumadin dose:10 mg every day Goal:2.5-3.5The patient does not have an active anticoagulation episode. Excessive bruising:no Nose bleeding:no Rectal bleeding:no Prolonged menstrual cycles:N/A Eating diet with consistent amounts of foods containing Vitamin K:yes Any recent antibiotic use?yes   Relevant past medical, surgical, family and social history reviewed and updated as indicated. Interim medical history since our last visit reviewed. Allergies and medications reviewed and updated.  ROS: Per HPI unless specifically indicated above     Objective:    BP 122/82   Pulse 81   Temp 97.8 F (36.6 C) (Oral)   Wt 262 lb (118.8 kg)   SpO2 93%   BMI 39.84 kg/m   Wt Readings from Last 3 Encounters:  08/16/19 262 lb (118.8 kg)    06/04/19 250 lb (113.4 kg)  05/08/19 252 lb (114.3 kg)     General: Well appearing, well nourished in no distress.  Normal mood and affect. Skin: No excessive bruising or rash  Last INR: 2.9    Last CBC:  Lab Results  Component Value Date   WBC 7.5 04/03/2019   HGB 15.4 04/03/2019   HCT 46.5 04/03/2019   MCV 82 04/03/2019   PLT 198 04/03/2019    Results for orders placed or performed during the hospital encounter of 08/15/19  SARS CORONAVIRUS 2 (TAT 6-24 HRS) Nasopharyngeal Nasopharyngeal Swab   Specimen: Nasopharyngeal Swab  Result Value Ref Range   SARS Coronavirus 2 NEGATIVE NEGATIVE       Assessment:     ICD-10-CM   1. S/P aortic valve replacement with metallic valve  Z95.4 CoaguChek XS/INR Waived    Plan:   Discussed current plan face-to-face with patient. For coumadin dosing, elected to hold dose and refer to Lovenox bridging above. Will plan to recheck INR in 1 week.

## 2019-08-16 NOTE — Patient Instructions (Addendum)
Hold Coumadin starting 08/14/2019, his colonoscopy is 08/19/2019. His last dose of Coumadin will be evening of 08/13/2019. Will start Lovenox on 08/16/2019.Marland Kitchen Lovenox 100 MG twice a day ordered to pharmacy, to start 08/16/2019 if INR <2.5. Will have him take AM Lovenox dose early on 08/18/2019, around 9 am, and that will be last dose until procedure 08/19/2019. Will overlap Lovenox and Coumadin post procedure and see back in office 08/22/2019 for INR check, discontinue Lovenox at that time if INR at goal.   YOU WILL COME BACK TO SEE ME AFTER COLONOSCOPY TO CHECK INR AND WORK TOWARDS COMING OFF LOVENOX.

## 2019-08-16 NOTE — Assessment & Plan Note (Signed)
Has upcoming colonoscopy, took last dose Coumadin 08/13/2019 as instructed.  INR today 1.6, started Lovenox with first dose in office today.  Plan: Discussed case with Vanice Sarah PharmD on CCM team, will have him hold Coumadin starting 08/14/2019, his colonoscopy is 08/19/2019. His last dose of Coumadin will be evening of 08/13/2019. Will start Lovenox on 08/16/2019, he has office visit with PCP that day. Lovenox 100 MG BID ordered to pharmacy, to start 08/16/2019 if INR <2.5. Will have him take AM Lovenox dose early on 08/18/2019, around 9 am, and that will be last dose until procedure 08/19/2019. Will overlap Lovenox and Coumadin post procedure and see back in office 08/22/2019 for INR check, discontinue Lovenox at that time if INR at goal.  Updated GI, Dr. Tobi Bastos, via secure chat of plan for colonoscopy and bridging.

## 2019-08-19 ENCOUNTER — Encounter
Admission: RE | Disposition: A | Discharge status: Home / Self Care | Payer: Self-pay | Source: Home / Self Care | Attending: Gastroenterology

## 2019-08-19 ENCOUNTER — Ambulatory Visit: Payer: 59 | Admitting: Registered Nurse

## 2019-08-19 ENCOUNTER — Other Ambulatory Visit: Payer: Self-pay

## 2019-08-19 ENCOUNTER — Ambulatory Visit
Admission: RE | Admit: 2019-08-19 | Discharge: 2019-08-19 | Disposition: A | Discharge status: Home / Self Care | Payer: 59 | Attending: Gastroenterology | Admitting: Gastroenterology

## 2019-08-19 ENCOUNTER — Encounter: Payer: Self-pay | Admitting: Gastroenterology

## 2019-08-19 DIAGNOSIS — Z833 Family history of diabetes mellitus: Secondary | ICD-10-CM | POA: Insufficient documentation

## 2019-08-19 DIAGNOSIS — Z8349 Family history of other endocrine, nutritional and metabolic diseases: Secondary | ICD-10-CM | POA: Diagnosis not present

## 2019-08-19 DIAGNOSIS — I10 Essential (primary) hypertension: Secondary | ICD-10-CM | POA: Insufficient documentation

## 2019-08-19 DIAGNOSIS — E119 Type 2 diabetes mellitus without complications: Secondary | ICD-10-CM | POA: Diagnosis not present

## 2019-08-19 DIAGNOSIS — Z6839 Body mass index (BMI) 39.0-39.9, adult: Secondary | ICD-10-CM | POA: Diagnosis not present

## 2019-08-19 DIAGNOSIS — Z1211 Encounter for screening for malignant neoplasm of colon: Secondary | ICD-10-CM | POA: Diagnosis not present

## 2019-08-19 DIAGNOSIS — E785 Hyperlipidemia, unspecified: Secondary | ICD-10-CM | POA: Insufficient documentation

## 2019-08-19 DIAGNOSIS — Z79899 Other long term (current) drug therapy: Secondary | ICD-10-CM | POA: Insufficient documentation

## 2019-08-19 DIAGNOSIS — Z7901 Long term (current) use of anticoagulants: Secondary | ICD-10-CM | POA: Diagnosis not present

## 2019-08-19 DIAGNOSIS — Z794 Long term (current) use of insulin: Secondary | ICD-10-CM | POA: Insufficient documentation

## 2019-08-19 DIAGNOSIS — Z952 Presence of prosthetic heart valve: Secondary | ICD-10-CM | POA: Insufficient documentation

## 2019-08-19 DIAGNOSIS — Z8249 Family history of ischemic heart disease and other diseases of the circulatory system: Secondary | ICD-10-CM | POA: Insufficient documentation

## 2019-08-19 DIAGNOSIS — Z800 Family history of malignant neoplasm of digestive organs: Secondary | ICD-10-CM | POA: Insufficient documentation

## 2019-08-19 HISTORY — PX: COLONOSCOPY WITH PROPOFOL: SHX5780

## 2019-08-19 LAB — GLUCOSE, CAPILLARY: Glucose-Capillary: 115 mg/dL — ABNORMAL HIGH (ref 70–99)

## 2019-08-19 SURGERY — COLONOSCOPY WITH PROPOFOL
Anesthesia: General

## 2019-08-19 MED ORDER — EPHEDRINE SULFATE 50 MG/ML IJ SOLN
INTRAMUSCULAR | Status: DC | PRN
  Administered 2019-08-19: 10 mg via INTRAVENOUS

## 2019-08-19 MED ORDER — PROPOFOL 500 MG/50ML IV EMUL
INTRAVENOUS | Status: DC | PRN
  Administered 2019-08-19: 125 ug/kg/min via INTRAVENOUS

## 2019-08-19 MED ORDER — DEXMEDETOMIDINE HCL 200 MCG/2ML IV SOLN
INTRAVENOUS | Status: DC | PRN
  Administered 2019-08-19: 20 ug via INTRAVENOUS

## 2019-08-19 MED ORDER — PROPOFOL 10 MG/ML IV BOLUS
INTRAVENOUS | Status: DC | PRN
  Administered 2019-08-19: 40 mg via INTRAVENOUS
  Administered 2019-08-19: 100 mg via INTRAVENOUS

## 2019-08-19 MED ORDER — PROPOFOL 500 MG/50ML IV EMUL
INTRAVENOUS | Status: AC
  Filled 2019-08-19: qty 50

## 2019-08-19 MED ORDER — EPHEDRINE 5 MG/ML INJ
INTRAVENOUS | Status: AC
  Filled 2019-08-19: qty 10

## 2019-08-19 MED ORDER — PROPOFOL 10 MG/ML IV BOLUS
INTRAVENOUS | Status: AC
  Filled 2019-08-19: qty 20

## 2019-08-19 MED ORDER — LIDOCAINE HCL (CARDIAC) PF 100 MG/5ML IV SOSY
PREFILLED_SYRINGE | INTRAVENOUS | Status: DC | PRN
  Administered 2019-08-19: 100 mg via INTRAVENOUS

## 2019-08-19 MED ORDER — SODIUM CHLORIDE 0.9 % IV SOLN: INTRAVENOUS | Status: DC | PRN

## 2019-08-19 NOTE — Transfer of Care (Signed)
Immediate Anesthesia Transfer of Care Note  Patient: Rodney TERRAL [name]  Procedure(s) Performed: COLONOSCOPY WITH PROPOFOL (N/A )  Patient Location: Endoscopy Unit  Anesthesia Type:General  Level of Consciousness: drowsy  Airway & Oxygen Therapy: Patient Spontanous Breathing and Patient connected to nasal cannula oxygen  Post-op Assessment: Report given to RN and Post -op Vital signs reviewed and stable  Post vital signs: Reviewed and stable  Last Vitals:  Vitals Value Taken Time  BP 83/56 (10 mg ephedrine given) 08/19/19 0913  Temp 37.2 C 08/19/19 0913  Pulse 77 08/19/19 0913  Resp 16 08/19/19 0913  SpO2 99 % 08/19/19 0913    Last Pain:  Vitals:   08/19/19 0913  TempSrc: Temporal  PainSc:       Patients Stated Pain Goal: 0 (08/19/19 0803)  Complications: No apparent anesthesia complications

## 2019-08-19 NOTE — Op Note (Signed)
[facility] Gastroenterology Patient Name: Rodney Kloosterman [name] Procedure Date: 08/19/2019 8:38 AM MRN: [id number] Account #: [id number] Date of Birth: 1961-01-23 Admit Type: Outpatient Age: 59 Room: [facility] ENDO ROOM 4 Gender: Male Note Status: Finalized Procedure:             Colonoscopy Indications:           Screening in patient at increased risk: Family history                         of 1st-degree relative with colorectal cancer Providers:             [name] MD, MD Medicines:             Monitored Anesthesia Care Complications:         No immediate complications. Procedure:             Pre-Anesthesia Assessment:                        - Prior to the procedure, a History and Physical was                         performed, and patient medications, allergies and                         sensitivities were reviewed. The patient's tolerance                         of previous anesthesia was reviewed.                        - The risks and benefits of the procedure and the                         sedation options and risks were discussed with the                         patient. All questions were answered and informed                         consent was obtained.                        - ASA Grade Assessment: II - A patient with mild                         systemic disease.                        After obtaining informed consent, the colonoscope was                         passed under direct vision. Throughout the procedure,                         the patient's blood pressure, pulse, and oxygen                         saturations were monitored continuously. The  Colonoscope was introduced through the anus and                         advanced to the the cecum, identified by the                         appendiceal orifice. The colonoscopy was performed                         with ease. The patient tolerated the procedure well.             The quality of the bowel preparation was excellent. Findings:      The perianal and digital rectal examinations were normal.      The entire examined colon appeared normal on direct and retroflexion       views. Impression:            - The entire examined colon is normal on direct and                         retroflexion views.                        - No specimens collected. Recommendation:        - Discharge patient to home (with escort).                        - Resume previous diet.                        - Continue present medications.                        - Repeat colonoscopy in 5 years for screening purposes. Procedure Code(s):     --- Professional ---                        [phone], Colonoscopy, flexible; diagnostic, including                         collection of specimen(s) by brushing or washing, when                         performed (separate procedure) Diagnosis Code(s):     --- Professional ---                        Z80.0, Family history of malignant neoplasm of                         digestive organs CPT copyright 2019 American Medical Association. All rights reserved. The codes documented in this report are preliminary and upon coder review may  be revised to meet current compliance requirements. [name], MD [name] MD, MD 08/19/2019 9:10:30 AM This report has been signed electronically. Number of Addenda: 0 Note Initiated On: 08/19/2019 8:38 AM Scope Withdrawal Time: 0 hours 18 minutes 55 seconds  Total Procedure Duration: 0 hours 20 minutes 25 seconds  Estimated Blood Loss:  Estimated blood loss: none.      [facility]

## 2019-08-19 NOTE — H&P (Signed)
Rodney Mood, MD [address], [place], [place], [address] [address], [place], [place], [address] Phone: [phone]  Fax: [phone]  Primary Care Physician:  Marjie Skiff, NP   Pre-Procedure History & Physical: HPI:  Rodney Dixon. is a 59 y.o. male is here for an colonoscopy.   Past Medical History:  Diagnosis Date  . Allergy   . Diabetes mellitus without complication (HCC)   . Hyperlipidemia   . Hypertension     Past Surgical History:  Procedure Laterality Date  . CARDIAC VALVE REPLACEMENT      Prior to Admission medications   Medication Sig Start Date End Date Taking? Authorizing Provider  atorvastatin (LIPITOR) 40 MG tablet Take 1 tablet (40 mg total) by mouth daily. 06/24/19  Yes Cannady, Jolene T, NP  benazepril (LOTENSIN) 40 MG tablet Take 1 tablet (40 mg total) by mouth daily. 05/08/19  Yes Particia Nearing, PA-C  carvedilol (COREG) 25 MG tablet Take 1 tablet (25 mg total) by mouth 2 (two) times daily with a meal. 06/04/19  Yes Asaro, Shanda Bumps I, NP  Continuous Blood Gluc Sensor (FREESTYLE LIBRE 14 DAY SENSOR) MISC To check blood sugar four times a day. 07/24/19  Yes Cannady, Jolene T, NP  dapagliflozin propanediol (FARXIGA) 10 MG TABS tablet Take 10 mg by mouth daily. 05/08/19  Yes Particia Nearing, PA-C  enoxaparin (LOVENOX) 100 MG/ML injection Inject 1 mL (100 mg total) into the skin every 12 (twelve) hours. 08/15/19  Yes Cannady, Jolene T, NP  fenofibrate (TRICOR) 145 MG tablet Take 1 tablet (145 mg total) by mouth daily. 05/08/19  Yes Particia Nearing, PA-C  Glucose Blood (BLOOD GLUCOSE TEST STRIPS) STRP 1 strip by In Vitro route daily. 07/19/18  Yes Crissman, Redge Gainer, MD  hydrochlorothiazide (HYDRODIURIL) 25 MG tablet Take 1 tablet (25 mg total) by mouth daily. 05/08/19  Yes Particia Nearing, PA-C  insulin aspart (NOVOLOG) 100 UNIT/ML FlexPen Inject 50 Units into the skin 2 (two) times daily with a meal.  07/25/19  Yes Cannady, Jolene T, NP  insulin degludec (TRESIBA FLEXTOUCH) 100 UNIT/ML FlexTouch Pen Inject 0.25 mLs (25 Units total) into the skin daily. 07/30/19  Yes Cannady, Jolene T, NP  metFORMIN (GLUCOPHAGE) 500 MG tablet TAKE 2 TABLETS BY MOUTH TWICE A DAY 06/21/19  Yes Cannady, Jolene T, NP  Semaglutide,0.25 or 0.5MG /DOS, (OZEMPIC, 0.25 OR 0.5 MG/DOSE,) 2 MG/1.5ML SOPN Inject 0.25 mg into the skin once a week. Start with 0.25MG  once a week x 4 weeks, then increase to 0.5MG  weekly. 06/21/19  Yes Cannady, Corrie Dandy T, NP  warfarin (COUMADIN) 10 MG tablet Take 1 tablet (10 mg total) by mouth daily. Take as directed 07/30/19  Yes Cannady, Jolene T, NP  warfarin (COUMADIN) 2 MG tablet TAKE 1 TABLET BY MOUTH WITH THE 10 MG TABLET ON TUESDAYS AND THURSDAYS Patient not taking: Reported on 08/16/2019 07/30/19   Aura Dials T, NP  warfarin (COUMADIN) 4 MG tablet Take 1 tablet (4 mg total) by mouth as directed. Patient not taking: Reported on 07/19/2019 01/07/19   Steele Sizer, MD  warfarin (COUMADIN) 5 MG tablet Take 1 tablet (5 mg total) by mouth as directed. Patient not taking: Reported on 07/19/2019 01/07/19   Steele Sizer, MD    Allergies as of 07/24/2019  . (No Known Allergies)    Family History  Problem Relation Age of Onset  . Diabetes Mother   . Heart disease Mother   .  Hyperlipidemia Mother   . Hypertension Mother     Social History   Socioeconomic History  . Marital status: Married    Spouse name: Not on file  . Number of children: Not on file  . Years of education: Not on file  . Highest education level: Not on file  Occupational History  . Not on file  Tobacco Use  . Smoking status: Never Smoker  . Smokeless tobacco: Never Used  Substance and Sexual Activity  . Alcohol use: No  . Drug use: No  . Sexual activity: Not on file  Other Topics Concern  . Not on file  Social History Narrative  . Not on file   Social Determinants of Health   Financial Resource Strain:    . Difficulty of Paying Living Expenses:   Food Insecurity:   . Worried About Charity fundraiser in the Last Year:   . Arboriculturist in the Last Year:   Transportation Needs:   . Film/video editor (Medical):   Marland Kitchen Lack of Transportation (Non-Medical):   Physical Activity: Inactive  . Days of Exercise per Week: 0 days  . Minutes of Exercise per Session: 0 min  Stress:   . Feeling of Stress :   Social Connections:   . Frequency of Communication with Friends and Family:   . Frequency of Social Gatherings with Friends and Family:   . Attends Religious Services:   . Active Member of Clubs or Organizations:   . Attends Archivist Meetings:   Marland Kitchen Marital Status:   Intimate Partner Violence:   . Fear of Current or Ex-Partner:   . Emotionally Abused:   Marland Kitchen Physically Abused:   . Sexually Abused:     Review of Systems: See HPI, otherwise negative ROS  Physical Exam: BP (!) 170/97   Pulse 80   Temp (!) 97.3 F (36.3 C) (Temporal)   Resp 20   Ht 5\' 8"  (1.727 m)   Wt 119.3 kg   SpO2 98%   BMI 39.99 kg/m  General:   Alert,  pleasant and cooperative in NAD Head:  Normocephalic and atraumatic. Neck:  Supple; no masses or thyromegaly. Lungs:  Clear throughout to auscultation, normal respiratory effort.    Heart:  +S1, +S2, Regular rate and rhythm, No edema. Abdomen:  Soft, nontender and nondistended. Normal bowel sounds, without guarding, and without rebound.   Neurologic:  Alert and  oriented x4;  grossly normal neurologically.  Impression/Plan: [name] is here for an colonoscopy to be performed for Screening colonoscopy father had colon cancer Risks, benefits, limitations, and alternatives regarding  colonoscopy have been reviewed with the patient.  Questions have been answered.  All parties agreeable.   [name], MD  08/19/2019, 8:35 AM

## 2019-08-19 NOTE — Anesthesia Preprocedure Evaluation (Signed)
Anesthesia Evaluation  Patient identified by MRN, date of birth, ID band Patient awake    Reviewed: Allergy & Precautions, H&P , NPO status , Patient's Chart, lab work & pertinent test results, reviewed documented beta blocker date and time   Airway Mallampati: III   Neck ROM: full    Dental  (+) Teeth Intact   Pulmonary neg pulmonary ROS,    Pulmonary exam normal        Cardiovascular Exercise Tolerance: Good hypertension, On Medications negative cardio ROS Normal cardiovascular exam Rhythm:regular Rate:Normal     Neuro/Psych negative neurological ROS  negative psych ROS   GI/Hepatic negative GI ROS, Neg liver ROS,   Endo/Other  diabetes, Well Controlled, Type 1, Insulin DependentMorbid obesity  Renal/GU negative Renal ROS  negative genitourinary   Musculoskeletal   Abdominal   Peds  Hematology negative hematology ROS (+)   Anesthesia Other Findings Past Medical History: No date: Allergy No date: Diabetes mellitus without complication (HCC) No date: Hyperlipidemia No date: Hypertension Past Surgical History: No date: CARDIAC VALVE REPLACEMENT BMI    Body Mass Index: 39.99 kg/m     Reproductive/Obstetrics negative OB ROS                             Anesthesia Physical Anesthesia Plan  ASA: III  Anesthesia Plan: General   Post-op Pain Management:    Induction:   PONV Risk Score and Plan:   Airway Management Planned:   Additional Equipment:   Intra-op Plan:   Post-operative Plan:   Informed Consent: I have reviewed the patients History and Physical, chart, labs and discussed the procedure including the risks, benefits and alternatives for the proposed anesthesia with the patient or authorized representative who has indicated his/her understanding and acceptance.     Dental Advisory Given  Plan Discussed with: CRNA  Anesthesia Plan Comments:          Anesthesia Quick Evaluation

## 2019-08-20 NOTE — Anesthesia Postprocedure Evaluation (Signed)
Anesthesia Post Note  Patient: Rodney TWEED [name]  Procedure(s) Performed: COLONOSCOPY WITH PROPOFOL (N/A )  Patient location during evaluation: PACU Anesthesia Type: General Level of consciousness: awake and alert Pain management: pain level controlled Vital Signs Assessment: post-procedure vital signs reviewed and stable Respiratory status: spontaneous breathing, nonlabored ventilation, respiratory function stable and patient connected to nasal cannula oxygen Cardiovascular status: blood pressure returned to baseline and stable Postop Assessment: no apparent nausea or vomiting Anesthetic complications: no     Last Vitals:  Vitals:   08/19/19 0940 08/19/19 0943  BP: 130/77 (!) 130/95  Pulse:    Resp:    Temp:    SpO2:      Last Pain:  Vitals:   08/19/19 0943  TempSrc:   PainSc: 0-No pain                 Yevette Edwards

## 2019-08-22 ENCOUNTER — Other Ambulatory Visit: Payer: 59

## 2019-08-22 ENCOUNTER — Telehealth (INDEPENDENT_AMBULATORY_CARE_PROVIDER_SITE_OTHER): Payer: 59 | Admitting: Nurse Practitioner

## 2019-08-22 ENCOUNTER — Other Ambulatory Visit: Payer: Self-pay

## 2019-08-22 ENCOUNTER — Encounter: Payer: Self-pay | Admitting: Nurse Practitioner

## 2019-08-22 DIAGNOSIS — Z954 Presence of other heart-valve replacement: Secondary | ICD-10-CM

## 2019-08-22 LAB — COAGUCHEK XS/INR WAIVED
INR: 1 (ref 0.9–1.1)
Prothrombin Time: 11.5 s

## 2019-08-22 NOTE — Assessment & Plan Note (Signed)
Recent colonoscopy with bridging.  INR today 1.0, will overlap Lovenox and Coumadin post procedure, start with Coumadin 15 MG x 2 days and then transition to 10 MG daily (his previous dose).  Discontinue Lovenox when INR at goal.  Return on Monday for INR check.  He was able to verbalize and repeat this plan back to provider.

## 2019-08-22 NOTE — Patient Instructions (Signed)
Bleeding Precautions When on Anticoagulant Therapy, Adult Anticoagulant therapy, also called blood thinner therapy, is medicine that helps to prevent and treat blood clots. The medicine works by stopping blood clots from forming or growing. Blood clots that form in your blood vessels can be dangerous. They can break loose and travel to the heart, lungs, or brain. This increases the risk of a heart attack, stroke, or blocked lung artery (pulmonary embolism). Anticoagulants also increase the risk of bleeding. Try to protect yourself from cuts and other injuries that can cause bleeding. It is important to take anticoagulants exactly as told by your health care provider. Why do I need to be on anticoagulant therapy? You may need this medicine if you are at risk of developing a blood clot. Conditions that increase your risk of a blood clot include:  Being born with heart disease or a heart malformation (congenital heart disease).  Developing heart disease.  Having had surgery, such as valve replacement.  Having had a serious accident or other type of severe injury (trauma).  Having certain types of cancer.  Having certain diseases that can increase blood clotting.  Having a high risk of stroke or heart attack.  Having atrial fibrillation (AF). What are the common anticoagulant medicines? There are several types of anticoagulant medicines. The most common types are:  Medicines that you take by mouth (oral medicines), such as: ? Warfarin. ? Novel oral anticoagulants (NOACs), such as:  Direct thrombin inhibitors (dabigatran).  Factor Xa inhibitors (apixaban, edoxaban, and rivaroxaban).  Injections, such as: ? Unfractionated heparin. ? Low molecular weight heparin. These anticoagulants work in different ways to prevent blood clots. They also have different risks and side effects. What do I need to remember while on anticoagulant therapy? Taking anticoagulants  Take your medicine at the  same time every day. If you forget to take your medicine, take it as soon as you remember. Do not double your dosage of medicine if you miss a whole day. Take your normal dose and call your health care provider.  Do not stop taking your medicine unless your health care provider approves. Stopping the medicine can increase your risk of developing a blood clot. Taking other medicines  Take over-the-counter and prescriptions medicines only as told by your health care provider.  Do not take over-the-counter NSAIDs, including aspirin and ibuprofen, while you are on anticoagulant therapy. These medicines increase your risk of dangerous bleeding.  Get approval from your health care provider before you start taking any new medicines, vitamins, or herbal products. Some of these could interfere with your therapy. General instructions  Keep all follow-up visits as told by your health care provider. This is important.  If you are pregnant or trying to get pregnant, talk with a health care provider about anticoagulants. Some of these medicines are not safe to take during pregnancy.  Tell all health care providers, including your dentist, that you are on anticoagulant therapy. It is especially important to tell providers before you have any surgery, medical procedures, or dental work done. What precautions should I take?   Be very careful when using knives, scissors, or other sharp objects.  Use an electric razor instead of a blade.  Do not use toothpicks.  Use a soft-bristled toothbrush. Brush your teeth gently.  Always wear shoes outdoors and wear slippers indoors.  Be careful when cutting your fingernails and toenails.  Place bath mats in the bathroom. If possible, install handrails as well.  Wear gloves while you do   yard work.  Wear your seat belt.  Prevent falls by removing loose rugs and extension cords from areas where you walk. Use a cane or walker if you need it.  Avoid  constipation by: ? Drinking enough fluid to keep your urine clear or pale yellow. ? Eating foods that are high in fiber, such as fresh fruits and vegetables, whole grains, and beans. ? Limiting foods that are high in fat and processed sugars, such as fried and sweet foods.  Do not play contact sports or participate in other activities that have a high risk for injury. What other precautions are important if on warfarin therapy? If you are taking a type of anticoagulant called warfarin, make sure you:  Work with a diet and nutrition specialist (dietitian) to make an eating plan. Do not make any sudden changes to your diet after you have started your eating plan.  Do not drink alcohol. It can interfere with your medicine and increase your risk of an injury that causes bleeding.  Get regular blood tests as told by your health care provider. What are some questions to ask my health care provider?  Why do I need anticoagulant therapy?  What is the best anticoagulant therapy for my condition?  How long will I need anticoagulant therapy?  What are the side effects of anticoagulant therapy?  When should I take my medicine? What should I do if I forget to take it?  Will I need to have regular blood tests?  Do I need to change my diet? Are there foods or drinks that I should avoid?  What activities are safe for me?  What should I do if I want to get pregnant? Contact a health care provider if:  You miss a dose of medicine: ? And you are not sure what to do. ? For more than one day.  You have: ? Menstrual bleeding that is heavier than normal. ? Bloody or brown urine. ? Easy bruising. ? Black and tarry stool or bright red stool. ? Side effects from your medicine.  You feel weak or dizzy.  You become pregnant. Get help right away if:  You have bleeding that will not stop within 20 minutes from: ? The nose. ? The gums. ? A cut on the skin.  You have a severe headache or  stomachache.  You vomit or cough up blood.  You fall or hit your head. Summary  Anticoagulant therapy, also called blood thinner therapy, is medicine that helps to prevent and treat blood clots.  Anticoagulants work in different ways to prevent blood clots. They also have different risks and side effects.  Talk with your health care provider about any precautions that you should take while on anticoagulant therapy. This information is not intended to replace advice given to you by your health care provider. Make sure you discuss any questions you have with your health care provider. Document Revised: 08/15/2018 Document Reviewed: 07/12/2016 Elsevier Patient Education  2020 [employer]  

## 2019-08-22 NOTE — Progress Notes (Signed)
Ht 5\' 8"  (1.727 m)   Wt 263 lb (119.3 kg)   BMI 39.99 kg/m    Subjective:    Patient ID: [name], male    DOB: May 24, 1960, 59 y.o.   MRN: [id number]   . This visit was completed via telephone due to the restrictions of the COVID-19 pandemic. All issues as above were discussed and addressed but no physical exam was performed. If it was felt that the patient should be evaluated in the office, they were directed there. The patient verbally consented to this visit. Patient was unable to complete an audio/visual visit due to Technical difficulties,Lack of internet. Due to the catastrophic nature of the COVID-19 pandemic, this visit was done through audio contact only. . Location of the patient: home . Location of the provider: home . Those involved with this call:  . Provider: [id number], DNP . CMA: Aura Dials, CMA . Front Desk/Registration: Myrtha Mantis  . Time spent on call: 15 minutes on the phone discussing health concerns. 10 minutes total spent in review of patient's record and preparation of their chart.  . I verified patient identity using two factors (patient name and date of birth). Patient consents verbally to being seen via telemedicine visit today.    CC: Coumadin management  HPI: This patient is a59 y.o.malewho presents for coumadin management. The expected duration of coumadin treatment islifelongThe reason for anticoagulation is mechanical heart valve.INR previous 1.6 and today 1.0, which is appropriate as has been bridged with Lovenox due to recent colonoscopy.  Colonoscopy performed 08/19/2019.  He had been on Lovenox 100 MG BID.   Will overlap Lovenox and Coumadin post procedure, start with Coumadin 15 MG x 2 days and then transition to 10 MG daily (his previous dose).  Discontinue Lovenox when INR at goal.  Present Coumadin dose:10 mg every day previously Goal:2.5-3.5The patient does not have an active anticoagulation episode. Excessive  bruising:no Nose bleeding:no Rectal bleeding:no Prolonged menstrual cycles:N/A Eating diet with consistent amounts of foods containing Vitamin K:yes Any recent antibiotic use?yes   Relevant past medical, surgical, family and social history reviewed and updated as indicated. Interim medical history since our last visit reviewed. Allergies and medications reviewed and updated.  ROS: Per HPI unless specifically indicated above     Objective:    Ht 5\' 8"  (1.727 m)   Wt 263 lb (119.3 kg)   BMI 39.99 kg/m   Wt Readings from Last 3 Encounters:  08/22/19 263 lb (119.3 kg)  08/19/19 263 lb (119.3 kg)  08/16/19 262 lb (118.8 kg)     General: Well appearing, well nourished in no distress.  Normal mood and affect. Skin: No excessive bruising or rash  Last INR: 1.6    Last CBC:  Lab Results  Component Value Date   WBC 7.5 04/03/2019   HGB 15.4 04/03/2019   HCT 46.5 04/03/2019   MCV 82 04/03/2019   PLT 198 04/03/2019    Results for orders placed or performed during the hospital encounter of 08/19/19  Glucose, capillary  Result Value Ref Range   Glucose-Capillary 115 (H) 70 - 99 mg/dL       Assessment:     04/05/2019   1. S/P aortic valve replacement with metallic valve  Z95.4     Plan:   Discussed current plan face-to-face with patient. For coumadin dosing, will overlap Lovenox and Coumadin post procedure, start with Coumadin 15 MG x 2 days and then transition to 10 MG daily (his  previous dose).  Discontinue Lovenox when INR at goal.. Will plan to recheck INR in 4 days (Monday).  I discussed the assessment and treatment plan with the patient. The patient was provided an opportunity to ask questions and all were answered. The patient agreed with the plan and demonstrated an understanding of the instructions.   The patient was advised to call back or seek an in-person evaluation if the symptoms worsen or if the condition fails to improve as anticipated.   I  provided 15+ minutes of time during this encounter.

## 2019-08-23 NOTE — Progress Notes (Signed)
Looks like you are seeing him for monitoring this

## 2019-08-26 ENCOUNTER — Ambulatory Visit (INDEPENDENT_AMBULATORY_CARE_PROVIDER_SITE_OTHER): Payer: 59 | Admitting: Nurse Practitioner

## 2019-08-26 ENCOUNTER — Other Ambulatory Visit: Payer: Self-pay

## 2019-08-26 ENCOUNTER — Encounter: Payer: Self-pay | Admitting: Nurse Practitioner

## 2019-08-26 VITALS — BP 123/81 | HR 85 | Temp 97.7°F | Wt 262.5 lb

## 2019-08-26 DIAGNOSIS — Z954 Presence of other heart-valve replacement: Secondary | ICD-10-CM

## 2019-08-26 LAB — COAGUCHEK XS/INR WAIVED
INR: 1.8 — ABNORMAL HIGH (ref 0.9–1.1)
Prothrombin Time: 21.6 s

## 2019-08-26 NOTE — Patient Instructions (Signed)
Bleeding Precautions When on Anticoagulant Therapy, Adult Anticoagulant therapy, also called blood thinner therapy, is medicine that helps to prevent and treat blood clots. The medicine works by stopping blood clots from forming or growing. Blood clots that form in your blood vessels can be dangerous. They can break loose and travel to the heart, lungs, or brain. This increases the risk of a heart attack, stroke, or blocked lung artery (pulmonary embolism). Anticoagulants also increase the risk of bleeding. Try to protect yourself from cuts and other injuries that can cause bleeding. It is important to take anticoagulants exactly as told by your health care provider. Why do I need to be on anticoagulant therapy? You may need this medicine if you are at risk of developing a blood clot. Conditions that increase your risk of a blood clot include:  Being born with heart disease or a heart malformation (congenital heart disease).  Developing heart disease.  Having had surgery, such as valve replacement.  Having had a serious accident or other type of severe injury (trauma).  Having certain types of cancer.  Having certain diseases that can increase blood clotting.  Having a high risk of stroke or heart attack.  Having atrial fibrillation (AF). What are the common anticoagulant medicines? There are several types of anticoagulant medicines. The most common types are:  Medicines that you take by mouth (oral medicines), such as: ? Warfarin. ? Novel oral anticoagulants (NOACs), such as:  Direct thrombin inhibitors (dabigatran).  Factor Xa inhibitors (apixaban, edoxaban, and rivaroxaban).  Injections, such as: ? Unfractionated heparin. ? Low molecular weight heparin. These anticoagulants work in different ways to prevent blood clots. They also have different risks and side effects. What do I need to remember while on anticoagulant therapy? Taking anticoagulants  Take your medicine at the  same time every day. If you forget to take your medicine, take it as soon as you remember. Do not double your dosage of medicine if you miss a whole day. Take your normal dose and call your health care provider.  Do not stop taking your medicine unless your health care provider approves. Stopping the medicine can increase your risk of developing a blood clot. Taking other medicines  Take over-the-counter and prescriptions medicines only as told by your health care provider.  Do not take over-the-counter NSAIDs, including aspirin and ibuprofen, while you are on anticoagulant therapy. These medicines increase your risk of dangerous bleeding.  Get approval from your health care provider before you start taking any new medicines, vitamins, or herbal products. Some of these could interfere with your therapy. General instructions  Keep all follow-up visits as told by your health care provider. This is important.  If you are pregnant or trying to get pregnant, talk with a health care provider about anticoagulants. Some of these medicines are not safe to take during pregnancy.  Tell all health care providers, including your dentist, that you are on anticoagulant therapy. It is especially important to tell providers before you have any surgery, medical procedures, or dental work done. What precautions should I take?   Be very careful when using knives, scissors, or other sharp objects.  Use an electric razor instead of a blade.  Do not use toothpicks.  Use a soft-bristled toothbrush. Brush your teeth gently.  Always wear shoes outdoors and wear slippers indoors.  Be careful when cutting your fingernails and toenails.  Place bath mats in the bathroom. If possible, install handrails as well.  Wear gloves while you do   yard work.  Wear your seat belt.  Prevent falls by removing loose rugs and extension cords from areas where you walk. Use a cane or walker if you need it.  Avoid  constipation by: ? Drinking enough fluid to keep your urine clear or pale yellow. ? Eating foods that are high in fiber, such as fresh fruits and vegetables, whole grains, and beans. ? Limiting foods that are high in fat and processed sugars, such as fried and sweet foods.  Do not play contact sports or participate in other activities that have a high risk for injury. What other precautions are important if on warfarin therapy? If you are taking a type of anticoagulant called warfarin, make sure you:  Work with a diet and nutrition specialist (dietitian) to make an eating plan. Do not make any sudden changes to your diet after you have started your eating plan.  Do not drink alcohol. It can interfere with your medicine and increase your risk of an injury that causes bleeding.  Get regular blood tests as told by your health care provider. What are some questions to ask my health care provider?  Why do I need anticoagulant therapy?  What is the best anticoagulant therapy for my condition?  How long will I need anticoagulant therapy?  What are the side effects of anticoagulant therapy?  When should I take my medicine? What should I do if I forget to take it?  Will I need to have regular blood tests?  Do I need to change my diet? Are there foods or drinks that I should avoid?  What activities are safe for me?  What should I do if I want to get pregnant? Contact a health care provider if:  You miss a dose of medicine: ? And you are not sure what to do. ? For more than one day.  You have: ? Menstrual bleeding that is heavier than normal. ? Bloody or brown urine. ? Easy bruising. ? Black and tarry stool or bright red stool. ? Side effects from your medicine.  You feel weak or dizzy.  You become pregnant. Get help right away if:  You have bleeding that will not stop within 20 minutes from: ? The nose. ? The gums. ? A cut on the skin.  You have a severe headache or  stomachache.  You vomit or cough up blood.  You fall or hit your head. Summary  Anticoagulant therapy, also called blood thinner therapy, is medicine that helps to prevent and treat blood clots.  Anticoagulants work in different ways to prevent blood clots. They also have different risks and side effects.  Talk with your health care provider about any precautions that you should take while on anticoagulant therapy. This information is not intended to replace advice given to you by your health care provider. Make sure you discuss any questions you have with your health care provider. Document Revised: 08/15/2018 Document Reviewed: 07/12/2016 Elsevier Patient Education  2020 [employer]  

## 2019-08-26 NOTE — Progress Notes (Signed)
   BP 123/81 (BP Location: Left Arm, Patient Position: Sitting, Cuff Size: Normal)   Pulse 85   Temp 97.7 F (36.5 C) (Oral)   Wt 262 lb 8 oz (119.1 kg)   SpO2 95%   BMI 39.91 kg/m    Subjective:    Patient ID: Rodney Chary [name], male    DOB: February 07, 1961, 59 y.o.   MRN: [id number]  CC: Coumadin management  HPI: This patient is a59 y.o.malewho presents for coumadin management. The expected duration of coumadin treatment islifelongThe reason for anticoagulation is mechanical heart valve.INR previous 1.0 and today 1.8, which is appropriate as has been bridged with Lovenox due to recent colonoscopy.  Colonoscopy performed 08/19/2019.  He had been on Lovenox 100 MG BID.   Last visit we bridged with Lovenox + Coumadin 15 MG x 2 days and then 10 MG daily.  Discontinue Lovenox when INR at goal, however patient has no Lovenox left and reports he does not want anymore and refuses to continue. Would prefer to work on Coumadin dosing.  Present Coumadin dose:10 mg every day previously Goal:2.5-3.5The patient does not have an active anticoagulation episode. Excessive bruising:no Nose bleeding:no Rectal bleeding:no Prolonged menstrual cycles:N/A Eating diet with consistent amounts of foods containing Vitamin K:yes Any recent antibiotic use?yes   Relevant past medical, surgical, family and social history reviewed and updated as indicated. Interim medical history since our last visit reviewed. Allergies and medications reviewed and updated.  ROS: Per HPI unless specifically indicated above     Objective:    BP 123/81 (BP Location: Left Arm, Patient Position: Sitting, Cuff Size: Normal)   Pulse 85   Temp 97.7 F (36.5 C) (Oral)   Wt 262 lb 8 oz (119.1 kg)   SpO2 95%   BMI 39.91 kg/m   Wt Readings from Last 3 Encounters:  08/26/19 262 lb 8 oz (119.1 kg)  08/22/19 263 lb (119.3 kg)  08/19/19 263 lb (119.3 kg)     General: Well appearing, well nourished in no distress.   Normal mood and affect. Skin: No excessive bruising or rash  Last INR: 1.0    Last CBC:  Lab Results  Component Value Date   WBC 7.5 04/03/2019   HGB 15.4 04/03/2019   HCT 46.5 04/03/2019   MCV 82 04/03/2019   PLT 198 04/03/2019    Results for orders placed or performed in visit on 08/22/19  CoaguChek XS/INR Waived  Result Value Ref Range   INR 1.0 0.9 - 1.1   Prothrombin Time 11.5 sec       Assessment:     ICD-10-CM   1. S/P aortic valve replacement with metallic valve  Z95.4 CoaguChek XS/INR Waived    Plan:   Discussed current plan face-to-face with patient. For coumadin dosing, will take Coumadin 15 MG x 3 days and then transition to 10 MG daily. Will plan to recheck INR in 7 days (Monday).

## 2019-08-26 NOTE — Assessment & Plan Note (Signed)
Recent colonoscopy with bridging.  INR today 1.8 (improving, but not to goal), will take Coumadin 15 MG x 3 days and then transition to 10 MG daily. Will plan to recheck INR in 7 days (Monday).  He refuses to continue Lovenox and has none left at home.  Return sooner to office if any bleeding or concerns.

## 2019-09-02 ENCOUNTER — Encounter: Payer: Self-pay | Admitting: Nurse Practitioner

## 2019-09-02 ENCOUNTER — Ambulatory Visit (INDEPENDENT_AMBULATORY_CARE_PROVIDER_SITE_OTHER): Payer: 59 | Admitting: Nurse Practitioner

## 2019-09-02 ENCOUNTER — Other Ambulatory Visit: Payer: Self-pay

## 2019-09-02 VITALS — BP 132/86 | HR 77 | Temp 98.1°F

## 2019-09-02 DIAGNOSIS — Z954 Presence of other heart-valve replacement: Secondary | ICD-10-CM | POA: Diagnosis not present

## 2019-09-02 LAB — COAGUCHEK XS/INR WAIVED
INR: 3.3 — ABNORMAL HIGH (ref 0.9–1.1)
Prothrombin Time: 39.5 s

## 2019-09-02 NOTE — Assessment & Plan Note (Signed)
Chronic, ongoing with Coumadin use.  INR today 3.3 (improved), will take Coumadin 10 MG daily. Will plan to recheck INR in 4 weeks.  Return sooner to office if any bleeding or concerns.

## 2019-09-02 NOTE — Progress Notes (Signed)
   BP 132/86   Pulse 77   Temp 98.1 F (36.7 C) (Oral)   SpO2 96%    Subjective:    Patient ID: Rodney Chary [name], male    DOB: 06-03-1960, 59 y.o.   MRN: [id number]  CC: Coumadin management  HPI: This patient is a57 y.o.malewho presents for coumadin management. The expected duration of coumadin treatment islifelongThe reason for anticoagulation is mechanical heart valve.INR previous 1.8 and today 3.3, which is appropriate as has been bridged with Lovenox due to recent colonoscopy.  Colonoscopy performed 08/19/2019.  He had been on Lovenox 100 MG BID prior to and after colonoscopy, stopped last visit.  Present Coumadin dose:10 mg every day previously Goal:2.5-3.5The patient does not have an active anticoagulation episode. Excessive bruising:no Nose bleeding:no Rectal bleeding:no Prolonged menstrual cycles:N/A Eating diet with consistent amounts of foods containing Vitamin K:yes Any recent antibiotic use?yes   Relevant past medical, surgical, family and social history reviewed and updated as indicated. Interim medical history since our last visit reviewed. Allergies and medications reviewed and updated.  ROS: Per HPI unless specifically indicated above     Objective:    BP 132/86   Pulse 77   Temp 98.1 F (36.7 C) (Oral)   SpO2 96%   Wt Readings from Last 3 Encounters:  08/26/19 262 lb 8 oz (119.1 kg)  08/22/19 263 lb (119.3 kg)  08/19/19 263 lb (119.3 kg)     General: Well appearing, well nourished in no distress.  Normal mood and affect. Skin: No excessive bruising or rash  Last INR: 1.8    Last CBC:  Lab Results  Component Value Date   WBC 7.5 04/03/2019   HGB 15.4 04/03/2019   HCT 46.5 04/03/2019   MCV 82 04/03/2019   PLT 198 04/03/2019    Results for orders placed or performed in visit on 08/26/19  CoaguChek XS/INR Waived  Result Value Ref Range   INR 1.8 (H) 0.9 - 1.1   Prothrombin Time 21.6 sec       Assessment:     ICD-10-CM     1. S/P aortic valve replacement with metallic valve  Z95.4 CoaguChek XS/INR Waived    Plan:   Discussed current plan face-to-face with patient. For coumadin dosing, will continue Coumadin 10 MG daily. Will plan to recheck INR in 4 weeks.

## 2019-09-02 NOTE — Patient Instructions (Signed)
Bleeding Precautions When on Anticoagulant Therapy, Adult Anticoagulant therapy, also called blood thinner therapy, is medicine that helps to prevent and treat blood clots. The medicine works by stopping blood clots from forming or growing. Blood clots that form in your blood vessels can be dangerous. They can break loose and travel to the heart, lungs, or brain. This increases the risk of a heart attack, stroke, or blocked lung artery (pulmonary embolism). Anticoagulants also increase the risk of bleeding. Try to protect yourself from cuts and other injuries that can cause bleeding. It is important to take anticoagulants exactly as told by your health care provider. Why do I need to be on anticoagulant therapy? You may need this medicine if you are at risk of developing a blood clot. Conditions that increase your risk of a blood clot include:  Being born with heart disease or a heart malformation (congenital heart disease).  Developing heart disease.  Having had surgery, such as valve replacement.  Having had a serious accident or other type of severe injury (trauma).  Having certain types of cancer.  Having certain diseases that can increase blood clotting.  Having a high risk of stroke or heart attack.  Having atrial fibrillation (AF). What are the common anticoagulant medicines? There are several types of anticoagulant medicines. The most common types are:  Medicines that you take by mouth (oral medicines), such as: ? Warfarin. ? Novel oral anticoagulants (NOACs), such as:  Direct thrombin inhibitors (dabigatran).  Factor Xa inhibitors (apixaban, edoxaban, and rivaroxaban).  Injections, such as: ? Unfractionated heparin. ? Low molecular weight heparin. These anticoagulants work in different ways to prevent blood clots. They also have different risks and side effects. What do I need to remember while on anticoagulant therapy? Taking anticoagulants  Take your medicine at the  same time every day. If you forget to take your medicine, take it as soon as you remember. Do not double your dosage of medicine if you miss a whole day. Take your normal dose and call your health care provider.  Do not stop taking your medicine unless your health care provider approves. Stopping the medicine can increase your risk of developing a blood clot. Taking other medicines  Take over-the-counter and prescriptions medicines only as told by your health care provider.  Do not take over-the-counter NSAIDs, including aspirin and ibuprofen, while you are on anticoagulant therapy. These medicines increase your risk of dangerous bleeding.  Get approval from your health care provider before you start taking any new medicines, vitamins, or herbal products. Some of these could interfere with your therapy. General instructions  Keep all follow-up visits as told by your health care provider. This is important.  If you are pregnant or trying to get pregnant, talk with a health care provider about anticoagulants. Some of these medicines are not safe to take during pregnancy.  Tell all health care providers, including your dentist, that you are on anticoagulant therapy. It is especially important to tell providers before you have any surgery, medical procedures, or dental work done. What precautions should I take?   Be very careful when using knives, scissors, or other sharp objects.  Use an electric razor instead of a blade.  Do not use toothpicks.  Use a soft-bristled toothbrush. Brush your teeth gently.  Always wear shoes outdoors and wear slippers indoors.  Be careful when cutting your fingernails and toenails.  Place bath mats in the bathroom. If possible, install handrails as well.  Wear gloves while you do   yard work.  Wear your seat belt.  Prevent falls by removing loose rugs and extension cords from areas where you walk. Use a cane or walker if you need it.  Avoid  constipation by: ? Drinking enough fluid to keep your urine clear or pale yellow. ? Eating foods that are high in fiber, such as fresh fruits and vegetables, whole grains, and beans. ? Limiting foods that are high in fat and processed sugars, such as fried and sweet foods.  Do not play contact sports or participate in other activities that have a high risk for injury. What other precautions are important if on warfarin therapy? If you are taking a type of anticoagulant called warfarin, make sure you:  Work with a diet and nutrition specialist (dietitian) to make an eating plan. Do not make any sudden changes to your diet after you have started your eating plan.  Do not drink alcohol. It can interfere with your medicine and increase your risk of an injury that causes bleeding.  Get regular blood tests as told by your health care provider. What are some questions to ask my health care provider?  Why do I need anticoagulant therapy?  What is the best anticoagulant therapy for my condition?  How long will I need anticoagulant therapy?  What are the side effects of anticoagulant therapy?  When should I take my medicine? What should I do if I forget to take it?  Will I need to have regular blood tests?  Do I need to change my diet? Are there foods or drinks that I should avoid?  What activities are safe for me?  What should I do if I want to get pregnant? Contact a health care provider if:  You miss a dose of medicine: ? And you are not sure what to do. ? For more than one day.  You have: ? Menstrual bleeding that is heavier than normal. ? Bloody or brown urine. ? Easy bruising. ? Black and tarry stool or bright red stool. ? Side effects from your medicine.  You feel weak or dizzy.  You become pregnant. Get help right away if:  You have bleeding that will not stop within 20 minutes from: ? The nose. ? The gums. ? A cut on the skin.  You have a severe headache or  stomachache.  You vomit or cough up blood.  You fall or hit your head. Summary  Anticoagulant therapy, also called blood thinner therapy, is medicine that helps to prevent and treat blood clots.  Anticoagulants work in different ways to prevent blood clots. They also have different risks and side effects.  Talk with your health care provider about any precautions that you should take while on anticoagulant therapy. This information is not intended to replace advice given to you by your health care provider. Make sure you discuss any questions you have with your health care provider. Document Revised: 08/15/2018 Document Reviewed: 07/12/2016 Elsevier Patient Education  2020 [employer]  

## 2019-09-17 ENCOUNTER — Ambulatory Visit: Payer: Self-pay | Admitting: Pharmacist

## 2019-09-17 ENCOUNTER — Other Ambulatory Visit: Payer: Self-pay | Admitting: Nurse Practitioner

## 2019-09-17 DIAGNOSIS — E119 Type 2 diabetes mellitus without complications: Secondary | ICD-10-CM

## 2019-09-17 DIAGNOSIS — E785 Hyperlipidemia, unspecified: Secondary | ICD-10-CM

## 2019-09-17 DIAGNOSIS — E1169 Type 2 diabetes mellitus with other specified complication: Secondary | ICD-10-CM

## 2019-09-17 MED ORDER — FREESTYLE LIBRE 14 DAY SENSOR MISC: 3 refills | Status: DC

## 2019-09-17 MED ORDER — DAPAGLIFLOZIN PROPANEDIOL 10 MG PO TABS: 10.0000 mg | ORAL_TABLET | Freq: Every day | ORAL | 4 refills | Status: DC

## 2019-09-17 NOTE — Chronic Care Management (AMB) (Signed)
Chronic Care Management   Follow Up Note   09/17/2019 Name: Rodney GAUTREAU [name] MRN: [id number] DOB: January 24, 1961  Referred by: Venita Lick, NP Reason for referral : Chronic Care Management (Medication Management)   Rodney Plumber [name] is a 59 y.o. year old male who is a primary care patient of Cannady, Barbaraann Faster, NP. The CCM team was consulted for assistance with chronic disease management and care coordination needs.    Contacted patient for medication management review.   Review of patient status, including review of consultants reports, relevant laboratory and other test results, and collaboration with appropriate care team members and the patient's provider was performed as part of comprehensive patient evaluation and provision of chronic care management services.    SDOH (Social Determinants of Health) assessments performed: No See Care Plan activities for detailed interventions related to [facility])     Outpatient Encounter Medications as of 09/17/2019  Medication Sig  . atorvastatin (LIPITOR) 40 MG tablet Take 1 tablet (40 mg total) by mouth daily.  . benazepril (LOTENSIN) 40 MG tablet Take 1 tablet (40 mg total) by mouth daily.  . carvedilol (COREG) 25 MG tablet Take 1 tablet (25 mg total) by mouth 2 (two) times daily with a meal.  . dapagliflozin propanediol (FARXIGA) 10 MG TABS tablet Take 10 mg by mouth daily.  . hydrochlorothiazide (HYDRODIURIL) 25 MG tablet Take 1 tablet (25 mg total) by mouth daily.  . insulin aspart (NOVOLOG) 100 UNIT/ML FlexPen Inject 50 Units into the skin 2 (two) times daily with a meal. (Patient taking differently: Inject 30 Units into the skin 3 (three) times daily with meals. )  . insulin degludec (TRESIBA FLEXTOUCH) 100 UNIT/ML FlexTouch Pen Inject 0.25 mLs (25 Units total) into the skin daily. (Patient taking differently: Inject 50 Units into the skin daily. )  . metFORMIN (GLUCOPHAGE) 500 MG tablet TAKE 2 TABLETS BY MOUTH TWICE A DAY  .  Semaglutide,0.25 or 0.5MG /DOS, (OZEMPIC, 0.25 OR 0.5 MG/DOSE,) 2 MG/1.5ML SOPN Inject 0.25 mg into the skin once a week. Start with 0.25MG  once a week x 4 weeks, then increase to 0.5MG  weekly.  Marland Kitchen warfarin (COUMADIN) 10 MG tablet Take 1 tablet (10 mg total) by mouth daily. Take as directed  . warfarin (COUMADIN) 2 MG tablet TAKE 1 TABLET BY MOUTH WITH THE 10 MG TABLET ON TUESDAYS AND THURSDAYS  . warfarin (COUMADIN) 4 MG tablet Take 1 tablet (4 mg total) by mouth as directed.  . Continuous Blood Gluc Sensor (FREESTYLE LIBRE 14 DAY SENSOR) MISC To check blood sugar four times a day.  . fenofibrate (TRICOR) 145 MG tablet Take 1 tablet (145 mg total) by mouth daily.  . Glucose Blood (BLOOD GLUCOSE TEST STRIPS) STRP 1 strip by In Vitro route daily.  Marland Kitchen warfarin (COUMADIN) 5 MG tablet Take 1 tablet (5 mg total) by mouth as directed. (Patient not taking: Reported on 07/19/2019)   No facility-administered encounter medications on file as of 09/17/2019.     Objective:   Goals Addressed            This Visit's Progress     Patient Stated   . PharmD "I need to work on diabetes" (pt-stated)       Rodney Dixon (see longtitudinal plan of care for additional care plan information)  Current Barriers:  . Diabetes: uncontrolled; complicated by chronic medical conditions including HTN, HLD, afib, most recent A1c 9.6% o Prescribed FreeStyle Libre, but notes that he wasn't able to get  the app to work on his phone. He does note that he has an old Samsung model.  o Requests a refill on Farxigabe sent to the pharmacy . Current antihyperglycemic regimen: metformin 1000 mg BID, Tresiba 50 units QAM, Novolog 30 units TID w/ meals, Farxiga 10 mg daily, Ozempic 0.25 mg once weekly (has now done 6 weeks, but did not increase) . No hypoglycemia recently . Current glucose readings: o Notes that he has not been checking recently d/t not having test strips . Cardiovascular risk reduction (Dr. Lady Gary) o Current  hypertensive regimen: carvedilol 25 mg BID, HCTZ 25 mg daily; benazepril 40 mg daily; BP at goal at recent provider visit since restarting benazepril o Current hyperlipidemia regimen: atorvastatin 40 mg, fenofibrate 145 mg daily o Current antiplatelet regimen: none; warfarin for afib + mitral aortic valve replacement; goal 2.5-3.5  Pharmacist Clinical Goal(s):  Marland Kitchen Over the next 90 days, patient will work with PharmD and primary care provider to address optimized glycemic control  Interventions: . Comprehensive medication review performed, medication list updated in electronic medical record . Inter-disciplinary care team collaboration (see longitudinal plan of care) . Reviewed that we can send a prescription for the FreeStyle Libre 14 day reader that he can use, if his [facility] phone is not compatible with LibreLink app. Will collaborate w/ PCP on this . Since he has completed 4 weeks of Ozempic, counseled to increase to 0.5 mg weekly as prescribed. He verbalized understanding . Will collaborate w/ PCP to send refill for Farxiga. . Patient has PCP f/u next week. Can f/u and see if he has been able to start using [employer] yet.   Patient Self Care Activities:  . Patient will check blood glucose BID , document, and provide at future appointments . Patient will take medications as prescribed . Patient will report any questions or concerns to provider   Please see past updates related to this goal by clicking on the "Past Updates" button in the selected goal          Plan:  - Scheduled f/u call in ~ 4 weeks  Catie Feliz Beam, PharmD, [facility] Clinical Pharmacist [facility] Practice/[facility] [phone]

## 2019-09-17 NOTE — Patient Instructions (Signed)
Visit Information  Goals Addressed            This Visit's Progress     Patient Stated   . PharmD "I need to work on diabetes" (pt-stated)       CARE PLAN ENTRY (see longtitudinal plan of care for additional care plan information)  Current Barriers:  . Diabetes: uncontrolled; complicated by chronic medical conditions including HTN, HLD, afib, most recent A1c 9.6% o Prescribed FreeStyle Libre, but notes that he wasn't able to get the app to work on his phone. He does note that he has an old [employer] model.  o Requests a refill on Farxigabe sent to the pharmacy . Current antihyperglycemic regimen: metformin 1000 mg BID, Tresiba 50 units QAM, Novolog 30 units TID w/ meals, Farxiga 10 mg daily, Ozempic 0.25 mg once weekly (has now done 6 weeks, but did not increase) . No hypoglycemia recently . Current glucose readings: o Notes that he has not been checking recently d/t not having test strips . Cardiovascular risk reduction (Dr. Lady Gary) o Current hypertensive regimen: carvedilol 25 mg BID, HCTZ 25 mg daily; benazepril 40 mg daily; BP at goal at recent provider visit since restarting benazepril o Current hyperlipidemia regimen: atorvastatin 40 mg, fenofibrate 145 mg daily o Current antiplatelet regimen: none; warfarin for afib + mitral aortic valve replacement; goal 2.5-3.5  Pharmacist Clinical Goal(s):  Marland Kitchen Over the next 90 days, patient will work with PharmD and primary care provider to address optimized glycemic control  Interventions: . Comprehensive medication review performed, medication list updated in electronic medical record . Inter-disciplinary care team collaboration (see longitudinal plan of care) . Reviewed that we can send a prescription for the FreeStyle Libre 14 day reader that he can use, if his [facility] phone is not compatible with LibreLink app. Will collaborate w/ PCP on this . Since he has completed 4 weeks of Ozempic, counseled to increase to 0.5 mg weekly as prescribed.  He verbalized understanding . Will collaborate w/ PCP to send refill for Farxiga. . Patient has PCP f/u next week. Can f/u and see if he has been able to start using [employer] yet.   Patient Self Care Activities:  . Patient will check blood glucose BID , document, and provide at future appointments . Patient will take medications as prescribed . Patient will report any questions or concerns to provider   Please see past updates related to this goal by clicking on the "Past Updates" button in the selected goal         Patient verbalizes understanding of instructions provided today.   Plan:  - Scheduled f/u call in ~ 4 weeks  Catie Feliz Beam, PharmD, [facility] Clinical Pharmacist [facility] [phone]

## 2019-09-23 ENCOUNTER — Encounter: Payer: Self-pay | Admitting: Nurse Practitioner

## 2019-09-23 ENCOUNTER — Ambulatory Visit (INDEPENDENT_AMBULATORY_CARE_PROVIDER_SITE_OTHER): Payer: 59 | Admitting: Nurse Practitioner

## 2019-09-23 ENCOUNTER — Other Ambulatory Visit: Payer: Self-pay

## 2019-09-23 VITALS — BP 132/76 | HR 86 | Temp 98.4°F | Wt 265.6 lb

## 2019-09-23 DIAGNOSIS — E1159 Type 2 diabetes mellitus with other circulatory complications: Secondary | ICD-10-CM | POA: Diagnosis not present

## 2019-09-23 DIAGNOSIS — D6869 Other thrombophilia: Secondary | ICD-10-CM | POA: Diagnosis not present

## 2019-09-23 DIAGNOSIS — I482 Chronic atrial fibrillation, unspecified: Secondary | ICD-10-CM

## 2019-09-23 DIAGNOSIS — E785 Hyperlipidemia, unspecified: Secondary | ICD-10-CM

## 2019-09-23 DIAGNOSIS — E1169 Type 2 diabetes mellitus with other specified complication: Secondary | ICD-10-CM | POA: Diagnosis not present

## 2019-09-23 DIAGNOSIS — I10 Essential (primary) hypertension: Secondary | ICD-10-CM

## 2019-09-23 DIAGNOSIS — Z6841 Body Mass Index (BMI) 40.0 and over, adult: Secondary | ICD-10-CM

## 2019-09-23 DIAGNOSIS — Z954 Presence of other heart-valve replacement: Secondary | ICD-10-CM

## 2019-09-23 LAB — COAGUCHEK XS/INR WAIVED
INR: 5 (ref 0.9–1.1)
Prothrombin Time: 59.7 s

## 2019-09-23 LAB — BAYER DCA HB A1C WAIVED: HB A1C (BAYER DCA - WAIVED): 9.9 % — ABNORMAL HIGH (ref <7.0)

## 2019-09-23 MED ORDER — FREESTYLE LIBRE 14 DAY READER DEVI: 1.0000 | Freq: Three times a day (TID) | 4 refills | Status: DC

## 2019-09-23 NOTE — Assessment & Plan Note (Signed)
Chronic, ongoing with Coumadin use.  INR today 5.1 (elevated from previous), will hold Coumadin x 2 days (tonight and tomorrow night) and then recheck on Wednesday, if improved restart at lower dosing.  Return sooner to office if any bleeding or concerns.

## 2019-09-23 NOTE — Assessment & Plan Note (Signed)
Refer to morbid obesity plan, discussed need for heavy focus on diet and exercise regimen.

## 2019-09-23 NOTE — Assessment & Plan Note (Signed)
Chronic, ongoing with BP at goal today.  Educated him on diabetes effect on vascular system.  Continue Benazepril, HCTZ, Coreg, and Fenofibrate/Atorvastatin.  Recommend he monitor BP at home at least a few mornings a week.  Discussed with him stroke prevention goal for BP <130/90.  BMP today.  Return in 3 months.

## 2019-09-23 NOTE — Assessment & Plan Note (Signed)
Chronic, ongoing.  Continue current medication regimen and adjust as needed. Lipid panel today. 

## 2019-09-23 NOTE — Assessment & Plan Note (Signed)
Chronic, ongoing. Continue Carvedilol for rate control and Coumadin, refer to s/p valve plan for Coumadin dosing. Recommend he return yearly for cardiology visits.  Continue to work with CCM team.

## 2019-09-23 NOTE — Patient Instructions (Signed)

## 2019-09-23 NOTE — Assessment & Plan Note (Signed)
Monitor CBC with use of daily Coumadin for mechanical valve and a-fib.  Denies any current bleeding episodes.  Check CBC next visit. 

## 2019-09-23 NOTE — Assessment & Plan Note (Signed)
Recommended eating smaller high protein, low fat meals more frequently and exercising 30 mins a day 5 times a week with a goal of 10-15lb weight loss in the next 3 months. Patient voiced their understanding and motivation to adhere to these recommendations.  

## 2019-09-23 NOTE — Assessment & Plan Note (Signed)
Chronic, ongoing with A1C 9.9% today, continues above goal.  Continue Metformin,  Farxiga, Tresiba, Ozempic, and Humalog. Focus heavily on diabetic diet and modest weight loss, decreasing carb and sugar intake at home.  Recommend he check BS TID, which he continues not to do regularly -- attach FreeStyle (pick this up from pharmacy).  Will continue to work with CCM team to ensure adherence to medication regimen, which is a major issue.  Referral to endocrinology placed.  Return in 3 months to office.

## 2019-09-23 NOTE — Progress Notes (Signed)
BP 132/76   Pulse 86   Temp 98.4 F (36.9 C) (Oral)   Wt 265 lb 9.6 oz (120.5 kg)   SpO2 94%   BMI 40.38 kg/m    Subjective:    Patient ID: Rodney Chary [name], male    DOB: 1960/12/13, 59 y.o.   MRN: [id number]  HPI: Rodney Sam. is a 59 y.o. male  Chief Complaint  Patient presents with  . Diabetes  . Hyperlipidemia  . Hypertension   DIABETES Last A1C9.6% recently, had trended up some from previous of 9%. Continues on Tresiba, [place], Humalog, Farxiga, Metformin 1000 MG BID. He does endorse being an "eater" and not following diabetic diet, works in and owns bakery.Continues on Gabapentin for neuropathy pain.  Is frustrated with A1C levels, has had poor control for years.  Has not attached [employer] yet. Hypoglycemic episodes:no Polydipsia/polyuria:no Visual disturbance:no Chest pain:no Paresthesias:no Glucose Monitoring:yes Accucheck frequency:not checking Fasting glucose: Post prandial: Evening:  Before meals: Taking Insulin?:yes Long acting insulin: Tresiba50 units Short acting insulin:30units twice a day Blood Pressure Monitoring:not checking Retinal Examination:Not up to Date Foot Exam:Up to Date Pneumovax:Up to Date Influenza:refused Aspirin:no  HYPERTENSION / HYPERLIPIDEMIA Continues on HCTZ, Lipitor, Coreg, Benazepril, and Fenofibrate + Atorvastatin. Last saw cardiology 07/02/19, to return to see them in 6 months. Satisfied with current treatment?yes Duration of hypertension:chronic BP monitoring frequency:not checking BP range:  BP medication side effects:no Duration of hyperlipidemia:chronic Cholesterol medication side effects:no Cholesterol supplements: none Medication compliance:fair compliance Aspirin:no Recent stressors:no Recurrent headaches:no Visual changes:no Palpitations:no Dyspnea:no Chest pain:no  Lower extremity edema:no Dizzy/lightheaded:no  ATRIAL FIBRILLATIONWITH METALLIC VALVE Currently takes Coumadin10 mg once a day.[facility] cardiology, last saw Dr. Lady Gary 07/02/2019.Last INR 3.3 on 09/02/19 and today 5.1.  He has been instructed to hold Coumadin x 2 days and then will recheck Wednesday.  Denies any bleeding. Atrial fibrillation status:stable Satisfied with current treatment:yes Medication side effects:no Medication compliance:good compliance Etiology of atrial fibrillation:  Palpitations:no Chest pain:no Dyspnea on exertion:no Orthopnea:no Syncope:no Edema:no Ventricular rate control:B-blocker Anti-coagulation:warfarin  Relevant past medical, surgical, family and social history reviewed and updated as indicated. Interim medical history since our last visit reviewed. Allergies and medications reviewed and updated.  Review of Systems  Constitutional: Negative for activity change, diaphoresis, fatigue and fever.  Respiratory: Negative for cough, chest tightness, shortness of breath and wheezing.   Cardiovascular: Negative for chest pain, palpitations and leg swelling.  Gastrointestinal: Negative.   Endocrine: Negative for polydipsia, polyphagia and polyuria.  Neurological: Negative.   Psychiatric/Behavioral: Negative.     Per HPI unless specifically indicated above     Objective:    BP 132/76   Pulse 86   Temp 98.4 F (36.9 C) (Oral)   Wt 265 lb 9.6 oz (120.5 kg)   SpO2 94%   BMI 40.38 kg/m   Wt Readings from Last 3 Encounters:  09/23/19 265 lb 9.6 oz (120.5 kg)  08/26/19 262 lb 8 oz (119.1 kg)  08/22/19 263 lb (119.3 kg)    Physical Exam Vitals and nursing note reviewed.  Constitutional:      General: He is awake. He is not in acute distress.    Appearance: He is well-developed. He is morbidly obese. He is not ill-appearing.  HENT:     Head: Normocephalic and atraumatic.     Right Ear: Hearing normal. No drainage.     Left  Ear: Hearing normal. No drainage.  Eyes:     General: Lids are normal.  Right eye: No discharge.        Left eye: No discharge.     Conjunctiva/sclera: Conjunctivae normal.     Pupils: Pupils are equal, round, and reactive to light.  Neck:     Thyroid: No thyromegaly.     Vascular: No carotid bruit.  Cardiovascular:     Rate and Rhythm: Normal rate and regular rhythm.     Heart sounds: Normal heart sounds, S1 normal and S2 normal. No murmur. No gallop.   Pulmonary:     Effort: Pulmonary effort is normal. No accessory muscle usage or respiratory distress.     Breath sounds: Normal breath sounds.  Abdominal:     General: Bowel sounds are normal.     Palpations: Abdomen is soft.  Musculoskeletal:        General: Normal range of motion.     Cervical back: Normal range of motion and neck supple.     Right lower leg: No edema.     Left lower leg: No edema.  Skin:    General: Skin is warm and dry.     Capillary Refill: Capillary refill takes less than 2 seconds.  Neurological:     Mental Status: He is alert and oriented to person, place, and time.     Deep Tendon Reflexes: Reflexes are normal and symmetric.  Psychiatric:        Attention and Perception: Attention normal.        Mood and Affect: Mood normal.        Speech: Speech normal.        Behavior: Behavior normal. Behavior is cooperative.        Thought Content: Thought content normal.     Results for orders placed or performed in visit on 09/23/19  Bayer DCA Hb A1c Waived  Result Value Ref Range   HB A1C (BAYER DCA - WAIVED) 9.9 (H) <7.0 %  CoaguChek XS/INR Waived  Result Value Ref Range   INR 5.0 (HH) 0.9 - 1.1   Prothrombin Time 59.7 sec      Assessment & Plan:   Problem List Items Addressed This Visit      Cardiovascular and Mediastinum   Chronic atrial fibrillation (HCC)    Chronic, ongoing. Continue Carvedilol for rate control and Coumadin, refer to s/p valve plan for Coumadin dosing. Recommend he  return yearly for cardiology visits.  Continue to work with CCM team.        Hypertension associated with type 2 diabetes mellitus ([place])    Chronic, ongoing with BP at goal today.  Educated him on diabetes effect on vascular system.  Continue Benazepril, HCTZ, Coreg, and Fenofibrate/Atorvastatin.  Recommend he monitor BP at home at least a few mornings a week.  Discussed with him stroke prevention goal for BP <130/90.  BMP today.  Return in 3 months.        Endocrine   Diabetes mellitus associated with hormonal etiology (Rodney Dixon) - Primary    Chronic, ongoing with A1C 9.9% today, continues above goal.  Continue Metformin,  Farxiga, Tresiba, Ozempic, and Humalog. Focus heavily on diabetic diet and modest weight loss, decreasing carb and sugar intake at home.  Recommend he check BS TID, which he continues not to do regularly -- attach FreeStyle (pick this up from pharmacy).  Will continue to work with CCM team to ensure adherence to medication regimen, which is a major issue.  Referral to endocrinology placed.  Return in 3 months to office.  Relevant Orders   Bayer DCA Hb A1c Waived (Completed)   Basic metabolic panel   Ambulatory referral to Endocrinology   Hyperlipidemia associated with type 2 diabetes mellitus (HCC)    Chronic, ongoing.  Continue current medication regimen and adjust as needed.  Lipid panel today.      Relevant Orders   Lipid Panel w/o Chol/HDL Ratio     Hematopoietic and Hemostatic   Acquired thrombophilia (HCC)    Monitor CBC with use of daily Coumadin for mechanical valve and a-fib.  Denies any current bleeding episodes.  Check CBC next visit.        Other   S/P aortic valve replacement with metallic valve    Chronic, ongoing with Coumadin use.  INR today 5.1 (elevated from previous), will hold Coumadin x 2 days (tonight and tomorrow night) and then recheck on Wednesday, if improved restart at lower dosing.  Return sooner to office if any bleeding or concerns.        Relevant Orders   CoaguChek XS/INR Waived (Completed)   Morbid obesity (HCC)    Recommended eating smaller high protein, low fat meals more frequently and exercising 30 mins a day 5 times a week with a goal of 10-15lb weight loss in the next 3 months. Patient voiced their understanding and motivation to adhere to these recommendations.       BMI 40.0-44.9, adult [facility])    Refer to morbid obesity plan, discussed need for heavy focus on diet and exercise regimen.          Follow up plan: Return in about 2 days (around 09/25/2019) for INR check === also needs 3 month follow-up scheduled for T2DM, HTN/HLD.

## 2019-09-24 LAB — LIPID PANEL W/O CHOL/HDL RATIO
Cholesterol, Total: 153 mg/dL (ref 100–199)
HDL: 20 mg/dL — ABNORMAL LOW (ref >39)
LDL Chol Calc (NIH): 51 mg/dL (ref 0–99)
Triglycerides: 555 mg/dL (ref 0–149)
VLDL Cholesterol Cal: 82 mg/dL — ABNORMAL HIGH (ref 5–40)

## 2019-09-24 LAB — BASIC METABOLIC PANEL
BUN/Creatinine Ratio: 16 (ref 9–20)
BUN: 22 mg/dL (ref 6–24)
CO2: 22 mmol/L (ref 20–29)
Calcium: 9.2 mg/dL (ref 8.7–10.2)
Chloride: 98 mmol/L (ref 96–106)
Creatinine, Ser: 1.41 mg/dL — ABNORMAL HIGH (ref 0.76–1.27)
GFR calc Af Amer: 63 mL/min/{1.73_m2} (ref >59)
GFR calc non Af Amer: 54 mL/min/{1.73_m2} — ABNORMAL LOW (ref >59)
Glucose: 304 mg/dL — ABNORMAL HIGH (ref 65–99)
Potassium: 4.5 mmol/L (ref 3.5–5.2)
Sodium: 134 mmol/L (ref 134–144)

## 2019-09-24 NOTE — Progress Notes (Signed)
Contacted via MyChart  Good afternoon Rodney Dixon.  Your labs have returned.  On these labs your kidney function is showing some mild decline and kidney disease with creatinine 1.41 and GFR 54.  This is most likely related to poor control of diabetes.  We will continue to monitor this closely and adjust medications as needed.  I recommend increasing water intake daily.  Your cholesterol levels show goal range LDL at 51, but your triglycerides are still elevated at 555.  Are you taking Fenofibrate daily?  Did you eat before labs?  Please let me know.  Have a great evening. Keep being awesome!! Kindest regards, Daniyah Fohl

## 2019-09-25 ENCOUNTER — Other Ambulatory Visit: Payer: Self-pay

## 2019-09-25 ENCOUNTER — Ambulatory Visit (INDEPENDENT_AMBULATORY_CARE_PROVIDER_SITE_OTHER): Payer: 59 | Admitting: Nurse Practitioner

## 2019-09-25 ENCOUNTER — Encounter: Payer: Self-pay | Admitting: Nurse Practitioner

## 2019-09-25 DIAGNOSIS — Z954 Presence of other heart-valve replacement: Secondary | ICD-10-CM | POA: Diagnosis not present

## 2019-09-25 DIAGNOSIS — I482 Chronic atrial fibrillation, unspecified: Secondary | ICD-10-CM

## 2019-09-25 MED ORDER — WARFARIN SODIUM 5 MG PO TABS: 5.0000 mg | ORAL_TABLET | ORAL | 2 refills | Status: DC

## 2019-09-25 MED ORDER — WARFARIN SODIUM 4 MG PO TABS: 4.0000 mg | ORAL_TABLET | ORAL | 2 refills | Status: DC

## 2019-09-25 NOTE — Assessment & Plan Note (Signed)
Chronic, ongoing with Coumadin use.  INR today has trended down from 5.1 and now 1.7, he reports taking medication consistently and following diet recommendations, will restart Coumadin at 9 MG daily (previously on 10 MG).  Return to office in one week for INR recheck.  Return sooner to office if any bleeding or concerns.

## 2019-09-25 NOTE — Progress Notes (Signed)
BP (!) 135/91 (BP Location: Left Arm, Patient Position: Sitting, Cuff Size: Normal)   Pulse 81   Temp 98.5 F (36.9 C) (Oral)   Ht 5' 8.9" (1.75 m)   Wt 260 lb 9.6 oz (118.2 kg)   SpO2 93%   BMI 38.60 kg/m    Subjective:    Patient ID: Rodney Chary [name], male    DOB: Aug 24, 1960, 59 y.o.   MRN: [id number]  CC: Coumadin management -- [place]  HPI: This patient is a59 y.o.malewho presents for coumadin management. The expected duration of coumadin treatment islifelongThe reason for anticoagulation is mechanical heart valve.INRprevious 5.0 and Coumadin was held x 2 days.  Previously taking 10 MG daily.    Present Coumadin dose: 1.7 Goal: 2.5-3.5 The patient does not have an active anticoagulation episode. Excessive bruising: no Nose bleeding: x one recently, but no further Rectal bleeding: no Eating diet with consistent amounts of foods containing Vitamin K:no Any recent antibiotic use? no  Relevant past medical, surgical, family and social history reviewed and updated as indicated. Interim medical history since our last visit reviewed. Allergies and medications reviewed and updated.  ROS: Per HPI unless specifically indicated above     Objective:    BP (!) 135/91 (BP Location: Left Arm, Patient Position: Sitting, Cuff Size: Normal)   Pulse 81   Temp 98.5 F (36.9 C) (Oral)   Ht 5' 8.9" (1.75 m)   Wt 260 lb 9.6 oz (118.2 kg)   SpO2 93%   BMI 38.60 kg/m   Wt Readings from Last 3 Encounters:  09/25/19 260 lb 9.6 oz (118.2 kg)  09/23/19 265 lb 9.6 oz (120.5 kg)  08/26/19 262 lb 8 oz (119.1 kg)     General: Well appearing, well nourished in no distress.  Normal mood and affect. Skin: No excessive bruising or rash  Last INR: 5.0    Last CBC:  Lab Results  Component Value Date   WBC 7.5 04/03/2019   HGB 15.4 04/03/2019   HCT 46.5 04/03/2019   MCV 82 04/03/2019   PLT 198 04/03/2019    Results for orders placed or performed in visit on 09/23/19  Bayer  DCA Hb A1c Waived  Result Value Ref Range   HB A1C (BAYER DCA - WAIVED) 9.9 (H) <7.0 %  Basic metabolic panel  Result Value Ref Range   Glucose 304 (H) 65 - 99 mg/dL   BUN 22 6 - 24 mg/dL   Creatinine, Ser 4.62 (H) 0.76 - 1.27 mg/dL   GFR calc non Af Amer 54 (L) >59 mL/min/1.73   GFR calc Af Amer 63 >59 mL/min/1.73   BUN/Creatinine Ratio 16 9 - 20   Sodium 134 134 - 144 mmol/L   Potassium 4.5 3.5 - 5.2 mmol/L   Chloride 98 96 - 106 mmol/L   CO2 22 20 - 29 mmol/L   Calcium 9.2 8.7 - 10.2 mg/dL  Lipid Panel w/o Chol/HDL Ratio  Result Value Ref Range   Cholesterol, Total 153 100 - 199 mg/dL   Triglycerides 703 (HH) 0 - 149 mg/dL   HDL 20 (L) >50 mg/dL   VLDL Cholesterol Cal 82 (H) 5 - 40 mg/dL   LDL Chol Calc (NIH) 51 0 - 99 mg/dL  CoaguChek XS/INR Waived  Result Value Ref Range   INR 5.0 (HH) 0.9 - 1.1   Prothrombin Time 59.7 sec       Assessment:     ICD-10-CM   1. S/P aortic valve replacement with  metallic valve  E23.3   2. Chronic atrial fibrillation (HCC)  I48.20 warfarin (COUMADIN) 4 MG tablet    warfarin (COUMADIN) 5 MG tablet   Refills sent in    Plan:   Discussed current plan face-to-face with patient. For coumadin dosing, elected to change dose to 9 MG daily. Will plan to recheck INR in 1 week.

## 2019-09-25 NOTE — Patient Instructions (Signed)
Bleeding Precautions When on Anticoagulant Therapy, Adult Anticoagulant therapy, also called blood thinner therapy, is medicine that helps to prevent and treat blood clots. The medicine works by stopping blood clots from forming or growing. Blood clots that form in your blood vessels can be dangerous. They can break loose and travel to the heart, lungs, or brain. This increases the risk of a heart attack, stroke, or blocked lung artery (pulmonary embolism). Anticoagulants also increase the risk of bleeding. Try to protect yourself from cuts and other injuries that can cause bleeding. It is important to take anticoagulants exactly as told by your health care provider. Why do I need to be on anticoagulant therapy? You may need this medicine if you are at risk of developing a blood clot. Conditions that increase your risk of a blood clot include:  Being born with heart disease or a heart malformation (congenital heart disease).  Developing heart disease.  Having had surgery, such as valve replacement.  Having had a serious accident or other type of severe injury (trauma).  Having certain types of cancer.  Having certain diseases that can increase blood clotting.  Having a high risk of stroke or heart attack.  Having atrial fibrillation (AF). What are the common anticoagulant medicines? There are several types of anticoagulant medicines. The most common types are:  Medicines that you take by mouth (oral medicines), such as: ? Warfarin. ? Novel oral anticoagulants (NOACs), such as:  Direct thrombin inhibitors (dabigatran).  Factor Xa inhibitors (apixaban, edoxaban, and rivaroxaban).  Injections, such as: ? Unfractionated heparin. ? Low molecular weight heparin. These anticoagulants work in different ways to prevent blood clots. They also have different risks and side effects. What do I need to remember while on anticoagulant therapy? Taking anticoagulants  Take your medicine at the  same time every day. If you forget to take your medicine, take it as soon as you remember. Do not double your dosage of medicine if you miss a whole day. Take your normal dose and call your health care provider.  Do not stop taking your medicine unless your health care provider approves. Stopping the medicine can increase your risk of developing a blood clot. Taking other medicines  Take over-the-counter and prescriptions medicines only as told by your health care provider.  Do not take over-the-counter NSAIDs, including aspirin and ibuprofen, while you are on anticoagulant therapy. These medicines increase your risk of dangerous bleeding.  Get approval from your health care provider before you start taking any new medicines, vitamins, or herbal products. Some of these could interfere with your therapy. General instructions  Keep all follow-up visits as told by your health care provider. This is important.  If you are pregnant or trying to get pregnant, talk with a health care provider about anticoagulants. Some of these medicines are not safe to take during pregnancy.  Tell all health care providers, including your dentist, that you are on anticoagulant therapy. It is especially important to tell providers before you have any surgery, medical procedures, or dental work done. What precautions should I take?   Be very careful when using knives, scissors, or other sharp objects.  Use an electric razor instead of a blade.  Do not use toothpicks.  Use a soft-bristled toothbrush. Brush your teeth gently.  Always wear shoes outdoors and wear slippers indoors.  Be careful when cutting your fingernails and toenails.  Place bath mats in the bathroom. If possible, install handrails as well.  Wear gloves while you do   yard work.  Wear your seat belt.  Prevent falls by removing loose rugs and extension cords from areas where you walk. Use a cane or walker if you need it.  Avoid  constipation by: ? Drinking enough fluid to keep your urine clear or pale yellow. ? Eating foods that are high in fiber, such as fresh fruits and vegetables, whole grains, and beans. ? Limiting foods that are high in fat and processed sugars, such as fried and sweet foods.  Do not play contact sports or participate in other activities that have a high risk for injury. What other precautions are important if on warfarin therapy? If you are taking a type of anticoagulant called warfarin, make sure you:  Work with a diet and nutrition specialist (dietitian) to make an eating plan. Do not make any sudden changes to your diet after you have started your eating plan.  Do not drink alcohol. It can interfere with your medicine and increase your risk of an injury that causes bleeding.  Get regular blood tests as told by your health care provider. What are some questions to ask my health care provider?  Why do I need anticoagulant therapy?  What is the best anticoagulant therapy for my condition?  How long will I need anticoagulant therapy?  What are the side effects of anticoagulant therapy?  When should I take my medicine? What should I do if I forget to take it?  Will I need to have regular blood tests?  Do I need to change my diet? Are there foods or drinks that I should avoid?  What activities are safe for me?  What should I do if I want to get pregnant? Contact a health care provider if:  You miss a dose of medicine: ? And you are not sure what to do. ? For more than one day.  You have: ? Menstrual bleeding that is heavier than normal. ? Bloody or brown urine. ? Easy bruising. ? Black and tarry stool or bright red stool. ? Side effects from your medicine.  You feel weak or dizzy.  You become pregnant. Get help right away if:  You have bleeding that will not stop within 20 minutes from: ? The nose. ? The gums. ? A cut on the skin.  You have a severe headache or  stomachache.  You vomit or cough up blood.  You fall or hit your head. Summary  Anticoagulant therapy, also called blood thinner therapy, is medicine that helps to prevent and treat blood clots.  Anticoagulants work in different ways to prevent blood clots. They also have different risks and side effects.  Talk with your health care provider about any precautions that you should take while on anticoagulant therapy. This information is not intended to replace advice given to you by your health care provider. Make sure you discuss any questions you have with your health care provider. Document Revised: 08/15/2018 Document Reviewed: 07/12/2016 Elsevier Patient Education  2020 [employer]  

## 2019-09-26 LAB — COAGUCHEK XS/INR WAIVED
INR: 1.7 — ABNORMAL HIGH (ref 0.9–1.1)
Prothrombin Time: 19.8 s

## 2019-09-26 NOTE — Addendum Note (Signed)
Addended by: Hyman Bible on: 09/26/2019 09:56 AM   Modules accepted: Orders

## 2019-10-04 ENCOUNTER — Other Ambulatory Visit: Payer: Self-pay

## 2019-10-04 ENCOUNTER — Ambulatory Visit (INDEPENDENT_AMBULATORY_CARE_PROVIDER_SITE_OTHER): Payer: 59 | Admitting: Nurse Practitioner

## 2019-10-04 ENCOUNTER — Encounter: Payer: Self-pay | Admitting: Nurse Practitioner

## 2019-10-04 VITALS — BP 136/78 | HR 86 | Temp 98.4°F | Wt 266.8 lb

## 2019-10-04 DIAGNOSIS — Z954 Presence of other heart-valve replacement: Secondary | ICD-10-CM | POA: Diagnosis not present

## 2019-10-04 LAB — COAGUCHEK XS/INR WAIVED
INR: 3.8 — ABNORMAL HIGH (ref 0.9–1.1)
Prothrombin Time: 45 s

## 2019-10-04 MED ORDER — TRESIBA FLEXTOUCH 100 UNIT/ML ~~LOC~~ SOPN: 50.0000 [IU] | PEN_INJECTOR | Freq: Every day | SUBCUTANEOUS | 6 refills | Status: DC

## 2019-10-04 NOTE — Progress Notes (Signed)
   BP 136/78   Pulse 86   Temp 98.4 F (36.9 C) (Oral)   Wt 266 lb 12.8 oz (121 kg)   SpO2 95%   BMI 39.52 kg/m    Subjective:    Patient ID: Rodney Chary [name], male    DOB: 1961/03/07, 59 y.o.   MRN: [id number]  CC: Coumadin management -- [place]  HPI: This patient is a59 y.o.malewho presents for coumadin management. The expected duration of coumadin treatment islifelongThe reason for anticoagulation is mechanical heart valve.INRprevious 1.7 and started on Coumadin 9 MG daily.  Previously taking 10 MG daily, but INR went to 5.1.    Present Coumadin dose: 3.8 Goal: 2.5-3.5 The patient does not have an active anticoagulation episode. Excessive bruising: no Nose bleeding: no Rectal bleeding: no Eating diet with consistent amounts of foods containing Vitamin K:no Any recent antibiotic use? no  Relevant past medical, surgical, family and social history reviewed and updated as indicated. Interim medical history since our last visit reviewed. Allergies and medications reviewed and updated.  ROS: Per HPI unless specifically indicated above     Objective:    BP 136/78   Pulse 86   Temp 98.4 F (36.9 C) (Oral)   Wt 266 lb 12.8 oz (121 kg)   SpO2 95%   BMI 39.52 kg/m   Wt Readings from Last 3 Encounters:  10/04/19 266 lb 12.8 oz (121 kg)  09/25/19 260 lb 9.6 oz (118.2 kg)  09/23/19 265 lb 9.6 oz (120.5 kg)     General: Well appearing, well nourished in no distress.  Normal mood and affect. Skin: No excessive bruising or rash  Last INR: 1.7    Last CBC:  Lab Results  Component Value Date   WBC 7.5 04/03/2019   HGB 15.4 04/03/2019   HCT 46.5 04/03/2019   MCV 82 04/03/2019   PLT 198 04/03/2019    Results for orders placed or performed in visit on 09/25/19  CoaguChek XS/INR Waived  Result Value Ref Range   INR 1.7 (H) 0.9 - 1.1   Prothrombin Time 19.8 sec       Assessment:     ICD-10-CM   1. 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 For coumadin dosing, elected to change dose to 8 MG on Monday and Friday, then 9 MG remainder of days. Will plan to recheck INR in 2 weeks.

## 2019-10-04 NOTE — Patient Instructions (Signed)
Start taking 8 MG Coumadin on Monday and Friday, then 9 MG the remainder of days.   Bleeding Precautions When on Anticoagulant Therapy, Adult Anticoagulant therapy, also called blood thinner therapy, is medicine that helps to prevent and treat blood clots. The medicine works by stopping blood clots from forming or growing. Blood clots that form in your blood vessels can be dangerous. They can break loose and travel to the heart, lungs, or brain. This increases the risk of a heart attack, stroke, or blocked lung artery (pulmonary embolism). Anticoagulants also increase the risk of bleeding. Try to protect yourself from cuts and other injuries that can cause bleeding. It is important to take anticoagulants exactly as told by your health care provider. Why do I need to be on anticoagulant therapy? You may need this medicine if you are at risk of developing a blood clot. Conditions that increase your risk of a blood clot include:  Being born with heart disease or a heart malformation (congenital heart disease).  Developing heart disease.  Having had surgery, such as valve replacement.  Having had a serious accident or other type of severe injury (trauma).  Having certain types of cancer.  Having certain diseases that can increase blood clotting.  Having a high risk of stroke or heart attack.  Having atrial fibrillation (AF). What are the common anticoagulant medicines? There are several types of anticoagulant medicines. The most common types are:  Medicines that you take by mouth (oral medicines), such as: ? Warfarin. ? Novel oral anticoagulants (NOACs), such as:  Direct thrombin inhibitors (dabigatran).  Factor Xa inhibitors (apixaban, edoxaban, and rivaroxaban).  Injections, such as: ? Unfractionated heparin. ? Low molecular weight heparin. These anticoagulants work in different ways to prevent blood clots. They also have different risks and side effects. What do I need to  remember while on anticoagulant therapy? Taking anticoagulants  Take your medicine at the same time every day. If you forget to take your medicine, take it as soon as you remember. Do not double your dosage of medicine if you miss a whole day. Take your normal dose and call your health care provider.  Do not stop taking your medicine unless your health care provider approves. Stopping the medicine can increase your risk of developing a blood clot. Taking other medicines  Take over-the-counter and prescriptions medicines only as told by your health care provider.  Do not take over-the-counter NSAIDs, including aspirin and ibuprofen, while you are on anticoagulant therapy. These medicines increase your risk of dangerous bleeding.  Get approval from your health care provider before you start taking any new medicines, vitamins, or herbal products. Some of these could interfere with your therapy. General instructions  Keep all follow-up visits as told by your health care provider. This is important.  If you are pregnant or trying to get pregnant, talk with a health care provider about anticoagulants. Some of these medicines are not safe to take during pregnancy.  Tell all health care providers, including your dentist, that you are on anticoagulant therapy. It is especially important to tell providers before you have any surgery, medical procedures, or dental work done. What precautions should I take?   Be very careful when using knives, scissors, or other sharp objects.  Use an electric razor instead of a blade.  Do not use toothpicks.  Use a soft-bristled toothbrush. Brush your teeth gently.  Always wear shoes outdoors and wear slippers indoors.  Be careful when cutting your fingernails and toenails.  Place bath mats in the bathroom. If possible, install handrails as well.  Wear gloves while you do yard work.  Wear your seat belt.  Prevent falls by removing loose rugs and  extension cords from areas where you walk. Use a cane or walker if you need it.  Avoid constipation by: ? Drinking enough fluid to keep your urine clear or pale yellow. ? Eating foods that are high in fiber, such as fresh fruits and vegetables, whole grains, and beans. ? Limiting foods that are high in fat and processed sugars, such as fried and sweet foods.  Do not play contact sports or participate in other activities that have a high risk for injury. What other precautions are important if on warfarin therapy? If you are taking a type of anticoagulant called warfarin, make sure you:  Work with a diet and nutrition specialist (dietitian) to make an eating plan. Do not make any sudden changes to your diet after you have started your eating plan.  Do not drink alcohol. It can interfere with your medicine and increase your risk of an injury that causes bleeding.  Get regular blood tests as told by your health care provider. What are some questions to ask my health care provider?  Why do I need anticoagulant therapy?  What is the best anticoagulant therapy for my condition?  How long will I need anticoagulant therapy?  What are the side effects of anticoagulant therapy?  When should I take my medicine? What should I do if I forget to take it?  Will I need to have regular blood tests?  Do I need to change my diet? Are there foods or drinks that I should avoid?  What activities are safe for me?  What should I do if I want to get pregnant? Contact a health care provider if:  You miss a dose of medicine: ? And you are not sure what to do. ? For more than one day.  You have: ? Menstrual bleeding that is heavier than normal. ? Bloody or brown urine. ? Easy bruising. ? Black and tarry stool or bright red stool. ? Side effects from your medicine.  You feel weak or dizzy.  You become pregnant. Get help right away if:  You have bleeding that will not stop within 20 minutes  from: ? The nose. ? The gums. ? A cut on the skin.  You have a severe headache or stomachache.  You vomit or cough up blood.  You fall or hit your head. Summary  Anticoagulant therapy, also called blood thinner therapy, is medicine that helps to prevent and treat blood clots.  Anticoagulants work in different ways to prevent blood clots. They also have different risks and side effects.  Talk with your health care provider about any precautions that you should take while on anticoagulant therapy. This information is not intended to replace advice given to you by your health care provider. Make sure you discuss any questions you have with your health care provider. Document Revised: 08/15/2018 Document Reviewed: 07/12/2016 Elsevier Patient Education  [place].

## 2019-10-04 NOTE — Assessment & Plan Note (Signed)
Chronic, ongoing with Coumadin use.  INR today has trended up from 1.7 and now 3.8, he reports taking medication consistently and following diet recommendations, will change Coumadin to 8 MG on Monday and Friday + Coumadin 9 MG remainder of days.  Return to office in two weeks for INR recheck.  Return sooner to office if any bleeding or concerns.

## 2019-10-15 ENCOUNTER — Telehealth: Payer: Self-pay

## 2019-10-16 ENCOUNTER — Ambulatory Visit: Payer: Self-pay | Admitting: Pharmacist

## 2019-10-16 DIAGNOSIS — E1169 Type 2 diabetes mellitus with other specified complication: Secondary | ICD-10-CM

## 2019-10-16 NOTE — Patient Instructions (Addendum)
Visit Information  Goals Addressed            This Visit's Progress     Patient Stated   . PharmD "I need to work on diabetes" (pt-stated)       CARE PLAN ENTRY (see longtitudinal plan of care for additional care plan information)  Current Barriers:  . Diabetes: uncontrolled; complicated by chronic medical conditions including HTN, HLD, afib, most recent A1c 9.9% o Notes he did start using FreeStyle Libre CGM o Questions today about difference in readings between CGM and finger sticks o Appt w/ endocrinology next month . Current antihyperglycemic regimen: metformin 1000 mg BID, Tresiba 50 units QAM, Novolog 30 units TID w/ meals, Farxiga 10 mg daily, Ozempic 0.5 mg once weekly (recently increased ~3 weeks ago) . Current glucose readings: declined to review today as he was driving . Cardiovascular risk reduction (Dr. Lady Gary) o Current hypertensive regimen: carvedilol 25 mg BID, HCTZ 25 mg daily; benazepril 40 mg daily; BP at goal at recent PCP visit o Current hyperlipidemia regimen: atorvastatin 40 mg, fenofibrate 145 mg daily; TG significantly elevated on last check, though adherence to fenofibrate questionable. BG may also be impacting o Current antiplatelet regimen: none; warfarin for afib + mitral aortic valve replacement; goal 2.5-3.5 . Peripheral neuropathy: patient endorses taking gabapentin 300 mg PRN (though last script was for 600 mg BID). Notes that this medication can cause weight gain, wonders about switching to pregabalin  Pharmacist Clinical Goal(s):  Marland Kitchen Over the next 90 days, patient will work with PharmD and primary care provider to address optimized glycemic control  Interventions: . Comprehensive medication review performed, medication list updated in electronic medical record . Inter-disciplinary care team collaboration (see longitudinal plan of care) . Reviewed lag time between interstitial glucose and blood glucose. Encouraged to use Libre to scan AT LEAST 4 times  daily to ensure full capture of glucose readings. Encouraged to do this for at least the 2 weeks prior to endocrinology appointment to allow for full download . Reviewed gabapentin vs pregabalin. Both medications do have a documented incidence of weight gain. Discussed that insulin more likely to be contributing to weight gain. Unsure that switching to pregabalin would be any more favorable than gabapentin regarding the side effect of weight loss. He will discuss w/ PCP at next appointment. Reviewed that diabetic control to reduce risk of long term micro and macrovascular complications likely outweighs this risk . Encouraged maximization of Ozempic to 1 mg weekly after at least 4 weeks on 0.5 mg weekly dose.   Patient Self Care Activities:  . Patient will check blood glucose BID , document, and provide at future appointments . Patient will take medications as prescribed . Patient will report any questions or concerns to provider   Please see past updates related to this goal by clicking on the "Past Updates" button in the selected goal         Patient verbalizes understanding of instructions provided today.    Plan:  - Scheduled f/u call in ~ 10 weeks  Catie Feliz Beam, PharmD, [facility] Clinical Pharmacist [facility] [phone]

## 2019-10-16 NOTE — Chronic Care Management (AMB) (Signed)
Chronic Care Management   Follow Up Note   10/16/2019 Name: Rodney T Waltner [name] MRN: [id number] DOB: 12/11/1960  Referred by: Cannady, Jolene T, NP Reason for referral : Chronic Care Management (Medication Management)   Rodney T Mckesson [name] is a 59 y.o. year old male who is a primary care patient of Cannady, Jolene T, NP. The CCM team was consulted for assistance with chronic disease management and care coordination needs.    Contacted patient for medication management review.   Review of patient status, including review of consultants reports, relevant laboratory and other test results, and collaboration with appropriate care team members and the patient's provider was performed as part of comprehensive patient evaluation and provision of chronic care management services.    SDOH (Social Determinants of Health) assessments performed: No See Care Plan activities for detailed interventions related to SDOH)     Outpatient Encounter Medications as of 10/16/2019  Medication Sig Note  . Continuous Blood Gluc Receiver (FREESTYLE LIBRE 14 DAY READER) DEVI 1 kit by Does not apply route 3 (three) times daily.   . Continuous Blood Gluc Sensor (FREESTYLE LIBRE 14 DAY SENSOR) MISC To check blood sugar four times a day.   . gabapentin (NEURONTIN) 300 MG capsule Take 600 mg by mouth 2 (two) times daily.  10/16/2019: Prescribed 600 mg BID, but taking PRN  . metFORMIN (GLUCOPHAGE) 500 MG tablet TAKE 2 TABLETS BY MOUTH TWICE A DAY   . Semaglutide,0.25 or 0.5MG/DOS, (OZEMPIC, 0.25 OR 0.5 MG/DOSE,) 2 MG/1.5ML SOPN Inject 0.25 mg into the skin once a week. Start with 0.25MG once a week x 4 weeks, then increase to 0.5MG weekly.   . atorvastatin (LIPITOR) 40 MG tablet Take 1 tablet (40 mg total) by mouth daily.   . benazepril (LOTENSIN) 40 MG tablet Take 1 tablet (40 mg total) by mouth daily.   . carvedilol (COREG) 25 MG tablet Take 1 tablet (25 mg total) by mouth 2 (two) times daily with a meal.   . dapagliflozin  propanediol (FARXIGA) 10 MG TABS tablet Take 10 mg by mouth daily.   . fenofibrate (TRICOR) 145 MG tablet Take 1 tablet (145 mg total) by mouth daily.   . Glucose Blood (BLOOD GLUCOSE TEST STRIPS) STRP 1 strip by In Vitro route daily.   . hydrochlorothiazide (HYDRODIURIL) 25 MG tablet Take 1 tablet (25 mg total) by mouth daily.   . insulin aspart (NOVOLOG) 100 UNIT/ML FlexPen Inject 50 Units into the skin 2 (two) times daily with a meal. (Patient taking differently: Inject 30 Units into the skin 3 (three) times daily with meals. )   . insulin degludec (TRESIBA FLEXTOUCH) 100 UNIT/ML FlexTouch Pen Inject 0.5 mLs (50 Units total) into the skin daily.   . warfarin (COUMADIN) 10 MG tablet Take 1 tablet (10 mg total) by mouth daily. Take as directed (Patient not taking: Reported on 10/04/2019)   . warfarin (COUMADIN) 2 MG tablet TAKE 1 TABLET BY MOUTH WITH THE 10 MG TABLET ON TUESDAYS AND THURSDAYS (Patient not taking: Reported on 10/04/2019)   . warfarin (COUMADIN) 4 MG tablet Take 1 tablet (4 mg total) by mouth as directed.   . warfarin (COUMADIN) 5 MG tablet Take 1 tablet (5 mg total) by mouth as directed.    No facility-administered encounter medications on file as of 10/16/2019.     Objective:   Goals Addressed            This Visit's Progress     Patient Stated   .   PharmD "I need to work on diabetes" (pt-stated)       CARE PLAN ENTRY (see longtitudinal plan of care for additional care plan information)  Current Barriers:  . Diabetes: uncontrolled; complicated by chronic medical conditions including HTN, HLD, afib, most recent A1c 9.9% o Notes he did start using FreeStyle Libre CGM o Questions today about difference in readings between CGM and finger sticks o Appt w/ endocrinology next month . Current antihyperglycemic regimen: metformin 1000 mg BID, Tresiba 50 units QAM, Novolog 30 units TID w/ meals, Farxiga 10 mg daily, Ozempic 0.5 mg once weekly (recently increased ~3 weeks  ago) . Current glucose readings: declined to review today as he was driving . Cardiovascular risk reduction (Dr. Fath) o Current hypertensive regimen: carvedilol 25 mg BID, HCTZ 25 mg daily; benazepril 40 mg daily; BP at goal at recent PCP visit o Current hyperlipidemia regimen: atorvastatin 40 mg, fenofibrate 145 mg daily; TG significantly elevated on last check, though adherence to fenofibrate questionable. BG may also be impacting o Current antiplatelet regimen: none; warfarin for afib + mitral aortic valve replacement; goal 2.5-3.5 . Peripheral neuropathy: patient endorses taking gabapentin 300 mg PRN (though last script was for 600 mg BID). Notes that this medication can cause weight gain, wonders about switching to pregabalin  Pharmacist Clinical Goal(s):  . Over the next 90 days, patient will work with PharmD and primary care provider to address optimized glycemic control  Interventions: . Comprehensive medication review performed, medication list updated in electronic medical record . Inter-disciplinary care team collaboration (see longitudinal plan of care) . Reviewed lag time between interstitial glucose and blood glucose. Encouraged to use Libre to scan AT LEAST 4 times daily to ensure full capture of glucose readings. Encouraged to do this for at least the 2 weeks prior to endocrinology appointment to allow for full download . Reviewed gabapentin vs pregabalin. Both medications do have a documented incidence of weight gain. Discussed that insulin more likely to be contributing to weight gain. Unsure that switching to pregabalin would be any more favorable than gabapentin regarding the side effect of weight loss. He will discuss w/ PCP at next appointment. Reviewed that diabetic control to reduce risk of long term micro and macrovascular complications likely outweighs this risk . Encouraged maximization of Ozempic to 1 mg weekly after at least 4 weeks on 0.5 mg weekly dose.   Patient  Self Care Activities:  . Patient will check blood glucose BID , document, and provide at future appointments . Patient will take medications as prescribed . Patient will report any questions or concerns to provider   Please see past updates related to this goal by clicking on the "Past Updates" button in the selected goal          Plan:  - Scheduled f/u call in ~ 10 weeks  Catie Travis, PharmD, BCACP Clinical Pharmacist [facility] [phone]   

## 2019-10-18 ENCOUNTER — Other Ambulatory Visit: Payer: Self-pay

## 2019-10-18 ENCOUNTER — Encounter: Payer: Self-pay | Admitting: Nurse Practitioner

## 2019-10-18 ENCOUNTER — Ambulatory Visit (INDEPENDENT_AMBULATORY_CARE_PROVIDER_SITE_OTHER): Payer: 59 | Admitting: Nurse Practitioner

## 2019-10-18 VITALS — BP 125/67 | HR 90 | Temp 98.1°F | Wt 265.0 lb

## 2019-10-18 DIAGNOSIS — Z954 Presence of other heart-valve replacement: Secondary | ICD-10-CM | POA: Diagnosis not present

## 2019-10-18 DIAGNOSIS — I482 Chronic atrial fibrillation, unspecified: Secondary | ICD-10-CM | POA: Diagnosis not present

## 2019-10-18 LAB — COAGUCHEK XS/INR WAIVED
INR: 4.3 — ABNORMAL HIGH (ref 0.9–1.1)
Prothrombin Time: 52 s

## 2019-10-18 MED ORDER — WARFARIN SODIUM 4 MG PO TABS: 4.0000 mg | ORAL_TABLET | ORAL | 2 refills | Status: DC

## 2019-10-18 NOTE — Assessment & Plan Note (Addendum)
Chronic, ongoing with Coumadin use.  INR today has trended up 3.8 to 4.3, he reports taking medication consistently and following diet recommendations, will change Coumadin to 8 MG daily, but hold dose this evening.  Return to office in one week for INR recheck.  Return sooner to office if any bleeding or concerns.

## 2019-10-18 NOTE — Patient Instructions (Signed)
Bleeding Precautions When on Anticoagulant Therapy, Adult Anticoagulant therapy, also called blood thinner therapy, is medicine that helps to prevent and treat blood clots. The medicine works by stopping blood clots from forming or growing. Blood clots that form in your blood vessels can be dangerous. They can break loose and travel to the heart, lungs, or brain. This increases the risk of a heart attack, stroke, or blocked lung artery (pulmonary embolism). Anticoagulants also increase the risk of bleeding. Try to protect yourself from cuts and other injuries that can cause bleeding. It is important to take anticoagulants exactly as told by your health care provider. Why do I need to be on anticoagulant therapy? You may need this medicine if you are at risk of developing a blood clot. Conditions that increase your risk of a blood clot include:  Being born with heart disease or a heart malformation (congenital heart disease).  Developing heart disease.  Having had surgery, such as valve replacement.  Having had a serious accident or other type of severe injury (trauma).  Having certain types of cancer.  Having certain diseases that can increase blood clotting.  Having a high risk of stroke or heart attack.  Having atrial fibrillation (AF). What are the common anticoagulant medicines? There are several types of anticoagulant medicines. The most common types are:  Medicines that you take by mouth (oral medicines), such as: ? Warfarin. ? Novel oral anticoagulants (NOACs), such as:  Direct thrombin inhibitors (dabigatran).  Factor Xa inhibitors (apixaban, edoxaban, and rivaroxaban).  Injections, such as: ? Unfractionated heparin. ? Low molecular weight heparin. These anticoagulants work in different ways to prevent blood clots. They also have different risks and side effects. What do I need to remember while on anticoagulant therapy? Taking anticoagulants  Take your medicine at the  same time every day. If you forget to take your medicine, take it as soon as you remember. Do not double your dosage of medicine if you miss a whole day. Take your normal dose and call your health care provider.  Do not stop taking your medicine unless your health care provider approves. Stopping the medicine can increase your risk of developing a blood clot. Taking other medicines  Take over-the-counter and prescriptions medicines only as told by your health care provider.  Do not take over-the-counter NSAIDs, including aspirin and ibuprofen, while you are on anticoagulant therapy. These medicines increase your risk of dangerous bleeding.  Get approval from your health care provider before you start taking any new medicines, vitamins, or herbal products. Some of these could interfere with your therapy. General instructions  Keep all follow-up visits as told by your health care provider. This is important.  If you are pregnant or trying to get pregnant, talk with a health care provider about anticoagulants. Some of these medicines are not safe to take during pregnancy.  Tell all health care providers, including your dentist, that you are on anticoagulant therapy. It is especially important to tell providers before you have any surgery, medical procedures, or dental work done. What precautions should I take?   Be very careful when using knives, scissors, or other sharp objects.  Use an electric razor instead of a blade.  Do not use toothpicks.  Use a soft-bristled toothbrush. Brush your teeth gently.  Always wear shoes outdoors and wear slippers indoors.  Be careful when cutting your fingernails and toenails.  Place bath mats in the bathroom. If possible, install handrails as well.  Wear gloves while you do   yard work.  Wear your seat belt.  Prevent falls by removing loose rugs and extension cords from areas where you walk. Use a cane or walker if you need it.  Avoid  constipation by: ? Drinking enough fluid to keep your urine clear or pale yellow. ? Eating foods that are high in fiber, such as fresh fruits and vegetables, whole grains, and beans. ? Limiting foods that are high in fat and processed sugars, such as fried and sweet foods.  Do not play contact sports or participate in other activities that have a high risk for injury. What other precautions are important if on warfarin therapy? If you are taking a type of anticoagulant called warfarin, make sure you:  Work with a diet and nutrition specialist (dietitian) to make an eating plan. Do not make any sudden changes to your diet after you have started your eating plan.  Do not drink alcohol. It can interfere with your medicine and increase your risk of an injury that causes bleeding.  Get regular blood tests as told by your health care provider. What are some questions to ask my health care provider?  Why do I need anticoagulant therapy?  What is the best anticoagulant therapy for my condition?  How long will I need anticoagulant therapy?  What are the side effects of anticoagulant therapy?  When should I take my medicine? What should I do if I forget to take it?  Will I need to have regular blood tests?  Do I need to change my diet? Are there foods or drinks that I should avoid?  What activities are safe for me?  What should I do if I want to get pregnant? Contact a health care provider if:  You miss a dose of medicine: ? And you are not sure what to do. ? For more than one day.  You have: ? Menstrual bleeding that is heavier than normal. ? Bloody or brown urine. ? Easy bruising. ? Black and tarry stool or bright red stool. ? Side effects from your medicine.  You feel weak or dizzy.  You become pregnant. Get help right away if:  You have bleeding that will not stop within 20 minutes from: ? The nose. ? The gums. ? A cut on the skin.  You have a severe headache or  stomachache.  You vomit or cough up blood.  You fall or hit your head. Summary  Anticoagulant therapy, also called blood thinner therapy, is medicine that helps to prevent and treat blood clots.  Anticoagulants work in different ways to prevent blood clots. They also have different risks and side effects.  Talk with your health care provider about any precautions that you should take while on anticoagulant therapy. This information is not intended to replace advice given to you by your health care provider. Make sure you discuss any questions you have with your health care provider. Document Revised: 08/15/2018 Document Reviewed: 07/12/2016 Elsevier Patient Education  2020 [employer]  

## 2019-10-18 NOTE — Progress Notes (Signed)
   BP 125/67   Pulse 90   Temp 98.1 F (36.7 C) (Oral)   Wt 265 lb (120.2 kg)   SpO2 92%   BMI 39.25 kg/m    Subjective:    Patient ID: Rodney Chary [name], male    DOB: 11/22/1960, 59 y.o.   MRN: [id number]  CC: Coumadin management -- [place]  HPI: This patient is a59 y.o.malewho presents for coumadin management. The expected duration of coumadin treatment islifelongThe reason for anticoagulation is mechanical heart valve.INRprevious 3.8 taking 8 MG on Monday and Friday, then 9 MG remainder of days -- today INR 4.3.  Present Coumadin dose: 8 MG on Monday and Friday, then 9 MG remainder of days Goal: 2.5-3.5 The patient does not have an active anticoagulation episode. Excessive bruising: no Nose bleeding: no Rectal bleeding: no Eating diet with consistent amounts of foods containing Vitamin K:no Any recent antibiotic use? no  Relevant past medical, surgical, family and social history reviewed and updated as indicated. Interim medical history since our last visit reviewed. Allergies and medications reviewed and updated.  ROS: Per HPI unless specifically indicated above     Objective:    BP 125/67   Pulse 90   Temp 98.1 F (36.7 C) (Oral)   Wt 265 lb (120.2 kg)   SpO2 92%   BMI 39.25 kg/m   Wt Readings from Last 3 Encounters:  10/18/19 265 lb (120.2 kg)  10/04/19 266 lb 12.8 oz (121 kg)  09/25/19 260 lb 9.6 oz (118.2 kg)     General: Well appearing, well nourished in no distress.  Normal mood and affect. Skin: No excessive bruising or rash  Last INR: 3.8    Last CBC:  Lab Results  Component Value Date   WBC 7.5 04/03/2019   HGB 15.4 04/03/2019   HCT 46.5 04/03/2019   MCV 82 04/03/2019   PLT 198 04/03/2019    Results for orders placed or performed in visit on 10/04/19  CoaguChek XS/INR Waived  Result Value Ref Range   INR 3.8 (H) 0.9 - 1.1   Prothrombin Time 45.0 sec       Assessment:     ICD-10-CM   1. S/P aortic valve replacement with  metallic valve  Z95.4 CoaguChek XS/INR Waived  2. Chronic atrial fibrillation (HCC)  I48.20 warfarin (COUMADIN) 4 MG tablet   Refills sent in    Plan:   Discussed current plan face-to-face with patient. For coumadin dosing, elected to change dose to hold dose this evening and then start 8 MG daily of Coumadin. Will plan to recheck INR in 2 weeks.

## 2019-10-25 ENCOUNTER — Encounter: Payer: Self-pay | Admitting: Nurse Practitioner

## 2019-10-25 ENCOUNTER — Other Ambulatory Visit: Payer: Self-pay

## 2019-10-25 ENCOUNTER — Ambulatory Visit (INDEPENDENT_AMBULATORY_CARE_PROVIDER_SITE_OTHER): Payer: 59 | Admitting: Nurse Practitioner

## 2019-10-25 VITALS — BP 121/74 | HR 89 | Temp 98.6°F

## 2019-10-25 DIAGNOSIS — F320 Major depressive disorder, single episode, mild: Secondary | ICD-10-CM | POA: Diagnosis not present

## 2019-10-25 DIAGNOSIS — Z954 Presence of other heart-valve replacement: Secondary | ICD-10-CM | POA: Diagnosis not present

## 2019-10-25 LAB — COAGUCHEK XS/INR WAIVED
INR: 3 — ABNORMAL HIGH (ref 0.9–1.1)
Prothrombin Time: 35.9 s

## 2019-10-25 MED ORDER — DULOXETINE HCL 30 MG PO CPEP: 30.0000 mg | ORAL_CAPSULE | Freq: Every day | ORAL | 3 refills | Status: DC

## 2019-10-25 NOTE — Assessment & Plan Note (Signed)
Ongoing with current request to restart medication.  Denies SI/HI.  Discussed options, will start Cymbalta at 30 MG daily (lowest dose) which may benefit neuropathy pain and mood.  Will need to monitor INR closely with adding on medication for mood, he is to return in 4 weeks for INR check.  Will adjust medications as needed.  Return to office in 4 weeks for follow-up, sooner if worsening mood.

## 2019-10-25 NOTE — Patient Instructions (Signed)
Bleeding Precautions When on Anticoagulant Therapy, Adult Anticoagulant therapy, also called blood thinner therapy, is medicine that helps to prevent and treat blood clots. The medicine works by stopping blood clots from forming or growing. Blood clots that form in your blood vessels can be dangerous. They can break loose and travel to the heart, lungs, or brain. This increases the risk of a heart attack, stroke, or blocked lung artery (pulmonary embolism). Anticoagulants also increase the risk of bleeding. Try to protect yourself from cuts and other injuries that can cause bleeding. It is important to take anticoagulants exactly as told by your health care provider. Why do I need to be on anticoagulant therapy? You may need this medicine if you are at risk of developing a blood clot. Conditions that increase your risk of a blood clot include:  Being born with heart disease or a heart malformation (congenital heart disease).  Developing heart disease.  Having had surgery, such as valve replacement.  Having had a serious accident or other type of severe injury (trauma).  Having certain types of cancer.  Having certain diseases that can increase blood clotting.  Having a high risk of stroke or heart attack.  Having atrial fibrillation (AF). What are the common anticoagulant medicines? There are several types of anticoagulant medicines. The most common types are:  Medicines that you take by mouth (oral medicines), such as: ? Warfarin. ? Novel oral anticoagulants (NOACs), such as:  Direct thrombin inhibitors (dabigatran).  Factor Xa inhibitors (apixaban, edoxaban, and rivaroxaban).  Injections, such as: ? Unfractionated heparin. ? Low molecular weight heparin. These anticoagulants work in different ways to prevent blood clots. They also have different risks and side effects. What do I need to remember while on anticoagulant therapy? Taking anticoagulants  Take your medicine at the  same time every day. If you forget to take your medicine, take it as soon as you remember. Do not double your dosage of medicine if you miss a whole day. Take your normal dose and call your health care provider.  Do not stop taking your medicine unless your health care provider approves. Stopping the medicine can increase your risk of developing a blood clot. Taking other medicines  Take over-the-counter and prescriptions medicines only as told by your health care provider.  Do not take over-the-counter NSAIDs, including aspirin and ibuprofen, while you are on anticoagulant therapy. These medicines increase your risk of dangerous bleeding.  Get approval from your health care provider before you start taking any new medicines, vitamins, or herbal products. Some of these could interfere with your therapy. General instructions  Keep all follow-up visits as told by your health care provider. This is important.  If you are pregnant or trying to get pregnant, talk with a health care provider about anticoagulants. Some of these medicines are not safe to take during pregnancy.  Tell all health care providers, including your dentist, that you are on anticoagulant therapy. It is especially important to tell providers before you have any surgery, medical procedures, or dental work done. What precautions should I take?   Be very careful when using knives, scissors, or other sharp objects.  Use an electric razor instead of a blade.  Do not use toothpicks.  Use a soft-bristled toothbrush. Brush your teeth gently.  Always wear shoes outdoors and wear slippers indoors.  Be careful when cutting your fingernails and toenails.  Place bath mats in the bathroom. If possible, install handrails as well.  Wear gloves while you do   yard work.  Wear your seat belt.  Prevent falls by removing loose rugs and extension cords from areas where you walk. Use a cane or walker if you need it.  Avoid  constipation by: ? Drinking enough fluid to keep your urine clear or pale yellow. ? Eating foods that are high in fiber, such as fresh fruits and vegetables, whole grains, and beans. ? Limiting foods that are high in fat and processed sugars, such as fried and sweet foods.  Do not play contact sports or participate in other activities that have a high risk for injury. What other precautions are important if on warfarin therapy? If you are taking a type of anticoagulant called warfarin, make sure you:  Work with a diet and nutrition specialist (dietitian) to make an eating plan. Do not make any sudden changes to your diet after you have started your eating plan.  Do not drink alcohol. It can interfere with your medicine and increase your risk of an injury that causes bleeding.  Get regular blood tests as told by your health care provider. What are some questions to ask my health care provider?  Why do I need anticoagulant therapy?  What is the best anticoagulant therapy for my condition?  How long will I need anticoagulant therapy?  What are the side effects of anticoagulant therapy?  When should I take my medicine? What should I do if I forget to take it?  Will I need to have regular blood tests?  Do I need to change my diet? Are there foods or drinks that I should avoid?  What activities are safe for me?  What should I do if I want to get pregnant? Contact a health care provider if:  You miss a dose of medicine: ? And you are not sure what to do. ? For more than one day.  You have: ? Menstrual bleeding that is heavier than normal. ? Bloody or brown urine. ? Easy bruising. ? Black and tarry stool or bright red stool. ? Side effects from your medicine.  You feel weak or dizzy.  You become pregnant. Get help right away if:  You have bleeding that will not stop within 20 minutes from: ? The nose. ? The gums. ? A cut on the skin.  You have a severe headache or  stomachache.  You vomit or cough up blood.  You fall or hit your head. Summary  Anticoagulant therapy, also called blood thinner therapy, is medicine that helps to prevent and treat blood clots.  Anticoagulants work in different ways to prevent blood clots. They also have different risks and side effects.  Talk with your health care provider about any precautions that you should take while on anticoagulant therapy. This information is not intended to replace advice given to you by your health care provider. Make sure you discuss any questions you have with your health care provider. Document Revised: 08/15/2018 Document Reviewed: 07/12/2016 Elsevier Patient Education  2020 [employer]  

## 2019-10-25 NOTE — Progress Notes (Signed)
BP 121/74   Pulse 89   Temp 98.6 F (37 C) (Oral)   SpO2 98%    Subjective:    Patient ID: Rodney Chary [name], male    DOB: June 30, 1960, 59 y.o.   MRN: [id number]  CC: Coumadin management -- [place]  HPI: This patient is a59 y.o.malewho presents for coumadin management. The expected duration of coumadin treatment islifelongThe reason for anticoagulation is mechanical heart valve.INRprevious 4.3 and changed dose to Coumadin 8 MG daily.  Present Coumadin dose: 8 MG daily Goal: 2.5-3.5 The patient does not have an active anticoagulation episode. Excessive bruising: no Nose bleeding: no Rectal bleeding: no Eating diet with consistent amounts of foods containing Vitamin K:no Any recent antibiotic use? no   DEPRESSION Previously took Celexa for this, but has been off for some time.  Would like to restart medication for mood.  Discussed at length options, he does underlying neuropathy from poorly controlled diabetes and takes Gabapentin -- discussed Cymbalta. Mood status: uncontrolled Satisfied with current treatment?: no Symptom severity: mild  Duration of current treatment : chronic Psychotherapy/counseling: none Depressed mood: yes Anxious mood: no Anhedonia: yes Significant weight loss or gain: no Insomnia: yes hard to stay asleep Fatigue: yes Feelings of worthlessness or guilt: yes Impaired concentration/indecisiveness: yes Suicidal ideations: no Hopelessness: no Crying spells: yes Depression screen [facility] 2/9 10/25/2019 08/16/2019 04/03/2019 06/28/2017 04/26/2017  Decreased Interest 1 2 0 0 0  Down, Depressed, Hopeless 1 0 0 0 0  PHQ - 2 Score 2 2 0 0 0  Altered sleeping 2 0 0 - -  Tired, decreased energy 3 2 0 - -  Change in appetite 3 2 2  - -  Feeling bad or failure about yourself  1 0 0 - -  Trouble concentrating 1 0 0 - -  Moving slowly or fidgety/restless 0 0 0 - -  Suicidal thoughts 0 0 0 - -  PHQ-9 Score 12 6 2  - -  Difficult doing work/chores Somewhat  difficult - Not difficult at all - -    Relevant past medical, surgical, family and social history reviewed and updated as indicated. Interim medical history since our last visit reviewed. Allergies and medications reviewed and updated.  ROS: Per HPI unless specifically indicated above     Objective:    BP 121/74   Pulse 89   Temp 98.6 F (37 C) (Oral)   SpO2 98%   Wt Readings from Last 3 Encounters:  10/18/19 265 lb (120.2 kg)  10/04/19 266 lb 12.8 oz (121 kg)  09/25/19 260 lb 9.6 oz (118.2 kg)     General: Well appearing, well nourished in no distress.  Normal mood and affect. Skin: No excessive bruising or rash  Last INR: 4.3    Last CBC:  Lab Results  Component Value Date   WBC 7.5 04/03/2019   HGB 15.4 04/03/2019   HCT 46.5 04/03/2019   MCV 82 04/03/2019   PLT 198 04/03/2019    Results for orders placed or performed in visit on 10/18/19  CoaguChek XS/INR Waived  Result Value Ref Range   INR 4.3 (H) 0.9 - 1.1   Prothrombin Time 52.0 sec       Assessment:     ICD-10-CM   1. Depression, major, single episode, mild (HCC)  F32.0   2. S/P aortic valve replacement with metallic valve  Z95.4 CoaguChek XS/INR Waived    Plan:   Discussed current plan face-to-face with patient. For coumadin dosing, elected to continue  8 MG daily of Coumadin. Will plan to recheck INR in 2 weeks.

## 2019-10-25 NOTE — Assessment & Plan Note (Signed)
Chronic, ongoing with Coumadin use.  INR today 3.  Continue Coumadin 8 MG daily, may need to adjust since adding on medication for mood.  Return to office in four weeks for INR recheck.  Return sooner to office if any bleeding or concerns.

## 2019-11-05 ENCOUNTER — Telehealth: Payer: Self-pay

## 2019-11-05 ENCOUNTER — Other Ambulatory Visit: Payer: Self-pay | Admitting: Nurse Practitioner

## 2019-11-05 MED ORDER — WARFARIN SODIUM 4 MG PO TABS: 8.0000 mg | ORAL_TABLET | Freq: Every day | ORAL | 4 refills | Status: DC

## 2019-11-05 NOTE — Telephone Encounter (Signed)
I have updated prescription and sent in.

## 2019-11-05 NOTE — Telephone Encounter (Signed)
Can patients coumadin prescription be updated to reflect that he is taking 8mg  a day, insurance will not fill due to the dose being 4mg  daily

## 2019-11-25 ENCOUNTER — Encounter: Payer: Self-pay | Admitting: Nurse Practitioner

## 2019-11-25 ENCOUNTER — Ambulatory Visit (INDEPENDENT_AMBULATORY_CARE_PROVIDER_SITE_OTHER): Payer: 59 | Admitting: Nurse Practitioner

## 2019-11-25 ENCOUNTER — Other Ambulatory Visit: Payer: Self-pay

## 2019-11-25 VITALS — BP 118/61 | HR 90 | Temp 98.2°F | Wt 264.8 lb

## 2019-11-25 DIAGNOSIS — F320 Major depressive disorder, single episode, mild: Secondary | ICD-10-CM | POA: Diagnosis not present

## 2019-11-25 DIAGNOSIS — Z954 Presence of other heart-valve replacement: Secondary | ICD-10-CM

## 2019-11-25 LAB — COAGUCHEK XS/INR WAIVED
INR: 3.1 — ABNORMAL HIGH (ref 0.9–1.1)
Prothrombin Time: 37.6 s

## 2019-11-25 NOTE — Patient Instructions (Signed)

## 2019-11-25 NOTE — Assessment & Plan Note (Signed)
BMI 39.22 with T2DM, HTN, A-Fib.  Recommended eating smaller high protein, low fat meals more frequently and exercising 30 mins a day 5 times a week with a goal of 10-15lb weight loss in the next 3 months. Patient voiced their understanding and motivation to adhere to these recommendations.

## 2019-11-25 NOTE — Assessment & Plan Note (Signed)
Ongoing and improved.  Denies SI/HI.  Continue Cymbalta at lowest dose 30 MG, PHQ9 scores have improved.  Will need to monitor INR closely with adding on medication for mood, he is to return in 4 weeks for INR check.  Will adjust medications as needed.  Return to office in 4 weeks for follow-up, sooner if worsening mood.

## 2019-11-25 NOTE — Progress Notes (Signed)
.cfp   BP 118/61   Pulse 90   Temp 98.2 F (36.8 C) (Oral)   Wt 264 lb 12.8 oz (120.1 kg)   SpO2 95%   BMI 39.22 kg/m    Subjective:    Patient ID: Rodney Chary [name], male    DOB: 1961-02-25, 59 y.o.   MRN: [id number]  CC: Coumadin management   HPI: This patient is a59 y.o.malewho presents for coumadin management. The expected duration of coumadin treatment islifelongThe reason for anticoagulation is mechanical heart valve.INRprevious 3 and continued Coumadin 8 MG daily.  Present Coumadin dose: 8 MG daily Goal: 2.5-3.5 The patient does not have an active anticoagulation episode. Excessive bruising: no Nose bleeding: no Rectal bleeding: no Eating diet with consistent amounts of foods containing Vitamin K:no Any recent antibiotic use? no   DEPRESSION Started on Cymbalta last visit (10/25/19) for mood, also for underlying neuropathy in conjunction with his Gabapentin.  He reports improved mood and improved nerve pain. Mood status: controlled Satisfied with current treatment?: no Symptom severity: mild  Duration of current treatment : chronic Psychotherapy/counseling: none Depressed mood: none Anxious mood: no Anhedonia: none Significant weight loss or gain: no Insomnia: yes hard to stay asleep Fatigue: none Feelings of worthlessness or guilt: none Impaired concentration/indecisiveness: yes Suicidal ideations: no Hopelessness: no Crying spells: none Depression screen [facility] 2/9 11/25/2019 10/25/2019 08/16/2019 04/03/2019 06/28/2017  Decreased Interest 1 1 2  0 0  Down, Depressed, Hopeless 0 1 0 0 0  PHQ - 2 Score 1 2 2  0 0  Altered sleeping 2 2 0 0 -  Tired, decreased energy 3 3 2  0 -  Change in appetite 1 3 2 2  -  Feeling bad or failure about yourself  0 1 0 0 -  Trouble concentrating 0 1 0 0 -  Moving slowly or fidgety/restless 0 0 0 0 -  Suicidal thoughts 0 0 0 0 -  PHQ-9 Score 7 12 6 2  -  Difficult doing work/chores Not difficult at all Somewhat difficult - Not  difficult at all -    Relevant past medical, surgical, family and social history reviewed and updated as indicated. Interim medical history since our last visit reviewed. Allergies and medications reviewed and updated.  ROS: Per HPI unless specifically indicated above     Objective:    BP 118/61   Pulse 90   Temp 98.2 F (36.8 C) (Oral)   Wt 264 lb 12.8 oz (120.1 kg)   SpO2 95%   BMI 39.22 kg/m   Wt Readings from Last 3 Encounters:  11/25/19 264 lb 12.8 oz (120.1 kg)  10/18/19 265 lb (120.2 kg)  10/04/19 266 lb 12.8 oz (121 kg)     General: Well appearing, well nourished in no distress.  Normal mood and affect. Skin: No excessive bruising or rash  Last INR: 3.0    Last CBC:  Lab Results  Component Value Date   WBC 7.5 04/03/2019   HGB 15.4 04/03/2019   HCT 46.5 04/03/2019   MCV 82 04/03/2019   PLT 198 04/03/2019    Results for orders placed or performed in visit on 10/25/19  CoaguChek XS/INR Waived  Result Value Ref Range   INR 3.0 (H) 0.9 - 1.1   Prothrombin Time 35.9 sec       Assessment:     ICD-10-CM   1. Depression, major, single episode, mild (HCC)  F32.0   2. Morbid obesity (HCC)  E66.01   3. S/P aortic valve replacement with  metallic valve  Z95.4 CoaguChek XS/INR Waived    Plan:   Discussed current plan face-to-face with patient. For coumadin dosing, elected to continue 8 MG daily of Coumadin. Will plan to recheck INR in 2 weeks.

## 2019-11-25 NOTE — Assessment & Plan Note (Signed)
Chronic, ongoing with Coumadin use.  INR today 3.1.  Continue Coumadin 8 MG daily, may need to adjust with medication for mood.  Return to office in four weeks for INR recheck.  Return sooner to office if any bleeding or concerns.

## 2019-12-19 ENCOUNTER — Encounter: Payer: Self-pay | Admitting: Nurse Practitioner

## 2019-12-19 DIAGNOSIS — Z794 Long term (current) use of insulin: Secondary | ICD-10-CM | POA: Insufficient documentation

## 2019-12-24 ENCOUNTER — Other Ambulatory Visit: Payer: Self-pay

## 2019-12-24 ENCOUNTER — Ambulatory Visit: Payer: 59 | Admitting: Nurse Practitioner

## 2019-12-25 ENCOUNTER — Telehealth: Payer: Self-pay

## 2019-12-30 ENCOUNTER — Other Ambulatory Visit: Payer: 59

## 2019-12-30 ENCOUNTER — Telehealth: Payer: Self-pay | Admitting: Nurse Practitioner

## 2019-12-30 ENCOUNTER — Other Ambulatory Visit: Payer: Self-pay

## 2019-12-30 DIAGNOSIS — D6869 Other thrombophilia: Secondary | ICD-10-CM

## 2019-12-30 DIAGNOSIS — E1169 Type 2 diabetes mellitus with other specified complication: Secondary | ICD-10-CM

## 2019-12-30 DIAGNOSIS — E1159 Type 2 diabetes mellitus with other circulatory complications: Secondary | ICD-10-CM

## 2019-12-30 DIAGNOSIS — Z954 Presence of other heart-valve replacement: Secondary | ICD-10-CM

## 2019-12-30 DIAGNOSIS — E785 Hyperlipidemia, unspecified: Secondary | ICD-10-CM

## 2019-12-30 LAB — COAGUCHEK XS/INR WAIVED
INR: 4.4 — ABNORMAL HIGH (ref 0.9–1.1)
Prothrombin Time: 52.3 s

## 2019-12-30 LAB — BAYER DCA HB A1C WAIVED: HB A1C (BAYER DCA - WAIVED): 9.6 % — ABNORMAL HIGH (ref <7.0)

## 2019-12-30 MED ORDER — WARFARIN SODIUM 3 MG PO TABS: 3.0000 mg | ORAL_TABLET | Freq: Every day | ORAL | 3 refills | Status: DC

## 2019-12-30 NOTE — Telephone Encounter (Signed)
Spoke to patient via telephone, his INR has increased to 4.4 from 3.1.  Denies any bleeding episodes.  He was recently started on medication for mood, which discussed with patient may be causing this increase.  Will hold Coumadin tonight and then tomorrow night start decreased dose -- decrease from current 8 MG daily to 7 MG daily.  Recheck INR on Friday.  He was able to verbalize this back.  Is to alert provider if any bleeding or go immediately to ER.  A1C was 9.9%, now 9.6%.  Has first visit with endo in 7 days, will maintain current medication regimen and review their recommendations when available.

## 2019-12-31 LAB — CBC WITH DIFFERENTIAL/PLATELET
Basophils Absolute: 0.1 10*3/uL (ref 0.0–0.2)
Basos: 1 %
EOS (ABSOLUTE): 0.1 10*3/uL (ref 0.0–0.4)
Eos: 1 %
Hematocrit: 44 % (ref 37.5–51.0)
Hemoglobin: 14.3 g/dL (ref 13.0–17.7)
Immature Grans (Abs): 0.1 10*3/uL (ref 0.0–0.1)
Immature Granulocytes: 1 %
Lymphocytes Absolute: 2.1 10*3/uL (ref 0.7–3.1)
Lymphs: 22 %
MCH: 28.4 pg (ref 26.6–33.0)
MCHC: 32.5 g/dL (ref 31.5–35.7)
MCV: 88 fL (ref 79–97)
Monocytes Absolute: 0.7 10*3/uL (ref 0.1–0.9)
Monocytes: 7 %
Neutrophils Absolute: 6.6 10*3/uL (ref 1.4–7.0)
Neutrophils: 68 %
Platelets: 273 10*3/uL (ref 150–450)
RBC: 5.03 x10E6/uL (ref 4.14–5.80)
RDW: 12.7 % (ref 11.6–15.4)
WBC: 9.6 10*3/uL (ref 3.4–10.8)

## 2019-12-31 LAB — LIPID PANEL W/O CHOL/HDL RATIO
Cholesterol, Total: 203 mg/dL — ABNORMAL HIGH (ref 100–199)
HDL: 24 mg/dL — ABNORMAL LOW (ref >39)
LDL Chol Calc (NIH): 82 mg/dL (ref 0–99)
Triglycerides: 597 mg/dL (ref 0–149)
VLDL Cholesterol Cal: 97 mg/dL — ABNORMAL HIGH (ref 5–40)

## 2019-12-31 LAB — BASIC METABOLIC PANEL
BUN/Creatinine Ratio: 23 — ABNORMAL HIGH (ref 9–20)
BUN: 37 mg/dL — ABNORMAL HIGH (ref 6–24)
CO2: 22 mmol/L (ref 20–29)
Calcium: 9.7 mg/dL (ref 8.7–10.2)
Chloride: 95 mmol/L — ABNORMAL LOW (ref 96–106)
Creatinine, Ser: 1.64 mg/dL — ABNORMAL HIGH (ref 0.76–1.27)
GFR calc Af Amer: 52 mL/min/{1.73_m2} — ABNORMAL LOW (ref >59)
GFR calc non Af Amer: 45 mL/min/{1.73_m2} — ABNORMAL LOW (ref >59)
Glucose: 412 mg/dL — ABNORMAL HIGH (ref 65–99)
Potassium: 4.4 mmol/L (ref 3.5–5.2)
Sodium: 134 mmol/L (ref 134–144)

## 2019-12-31 NOTE — Telephone Encounter (Signed)
Thank you, keep current lab appointment then.

## 2019-12-31 NOTE — Telephone Encounter (Signed)
Pt scheduled for 01/03/2020 at 1:00 for labs

## 2019-12-31 NOTE — Progress Notes (Signed)
Contacted via MyChart  Good afternoon Mr. Perz, your labs have returned.   - CBC shows no anemia.   - You are continuing to show some mild kidney disease, with some slight decline on these labs.  Your glucose was 412, high.  We definitely need to get diabetes under better control.  Please ensure you attend upcoming endocrinology visit and will review what changes get made.  Also ensure working on diet at home.  -Your triglycerides are elevated these labs, I suspect these were not fasting labs correct?  Would like to check this with you fasting, but you may benefit from increase in Atorvastatin to max dose of 80 MG, would this be okay with you?  If so I can send increase in for you.  Continue Fenofibrate.   Will recheck INR on Friday, you are on a couple medications that can enhance Warfarin so we may need to adjust here and there.  Have a great day!! Keep being awesome!!  Thank you for allowing me to participate in your care. Kindest regards, Rebbeca Sheperd

## 2019-12-31 NOTE — Telephone Encounter (Signed)
Can we move lab appointment up to early morning and ask him to be fasting.  His triglycerides were elevated on labs this visit and would like to recheck with him fasting please.  Thanks.

## 2019-12-31 NOTE — Telephone Encounter (Signed)
Pt said he could absolutely not come in the morning due to work I offered multiple apt times and he stated he has to be at work in the morning. Pt refused to come in early.

## 2020-01-03 ENCOUNTER — Other Ambulatory Visit: Payer: 59

## 2020-01-09 ENCOUNTER — Ambulatory Visit: Payer: 59 | Admitting: Nurse Practitioner

## 2020-01-10 ENCOUNTER — Ambulatory Visit: Admit: 2020-01-10 | Discharge status: Against Medical Advice | Payer: 59

## 2020-01-10 ENCOUNTER — Telehealth: Payer: Self-pay | Admitting: Nurse Practitioner

## 2020-01-10 MED ORDER — BENZONATATE 100 MG PO CAPS: 100.0000 mg | ORAL_CAPSULE | Freq: Three times a day (TID) | ORAL | 0 refills | Status: DC | PRN

## 2020-01-10 MED ORDER — ALBUTEROL SULFATE HFA 108 (90 BASE) MCG/ACT IN AERS: 2.0000 | INHALATION_SPRAY | Freq: Four times a day (QID) | RESPIRATORY_TRACT | 0 refills | Status: DC | PRN

## 2020-01-10 NOTE — Telephone Encounter (Signed)
Spoke to patient on telephone, discussed Covid with him.  Symptoms started on 01/04/20 and he did a home self test which was positive.  Continues with cough. Highly recommended he pursue MAB infusion, he refuses this.  Discussed at length MAB infusion decreasing risk for hospitalization or worsening illness. At this time will send in Albuterol and Tessalon.  Recommend if worsening symptoms over weekend to immediately go to ER.  I connected by phone with Rodney Chary [name] on 01/10/2020 at 5:21 PM to discuss the potential use of an new treatment for mild to moderate COVID-19 viral infection in non-hospitalized patients.  This patient is a 59 y.o. male that meets the FDA criteria for Emergency Use Authorization of bamlanivimab:  Has a (+) direct SARS-CoV-2 viral test result  Has mild or moderate COVID-19   Is ? 60 years of age and weighs ? 40 kg  Is NOT hospitalized due to COVID-19  Is NOT requiring oxygen therapy or requiring an increase in baseline oxygen flow rate due to COVID-19  Is within 10 days of symptom onset  Has at least one of the high risk factor(s) for progression to severe COVID-19 and/or hospitalization as defined in EUA.  Specific high risk criteria : BMI > 25, Diabetes and Cardiovascular disease or hypertension  Reviewed patient chronic problem list, which is currently managed by their PCP.  I have spoken and communicated the following to the patient or parent/caregiver:  1. FDA has authorized the emergency use of bamlanivimab for the treatment of mild to moderate COVID-19 in adults and pediatric patients with positive results of direct SARS-CoV-2 viral testing who are 55 years of age and older weighing at least 40 kg, and who are at high risk for progressing to severe COVID-19 and/or hospitalization.  2. The significant known and potential risks and benefits of bamlanivimab, and the extent to which such potential risks and benefits are unknown.  3. Information on available  alternative treatments and the risks and benefits of those alternatives, including clinical trials.  4. Patients treated with bamlanivimab should continue to self-isolate and use infection control measures (e.g., wear mask, isolate, social distance, avoid sharing personal items, clean and disinfect high touch surfaces, and frequent handwashing) according to CDC guidelines.   5. The patient or parent/caregiver has the option to accept or refuse bamlanivimab.  After reviewing this information with the patient, The patient has DECLINED offer to receive the infusion.  [name] [name] 01/10/2020 5:21 PM

## 2020-01-10 NOTE — Telephone Encounter (Signed)
  Called pt no availibilty today scheduled virtual for Tuesday advise if worse seek medical attention  Copied from CRM [id number]. Topic: General - Other >> Jan 10, 2020 10:21 AM Tamela Oddi wrote: Reason for CRM: Patient tested positive for covid and wife called to see if something could be prescribed for his cough.  Please advise and call patient at [phone]

## 2020-01-14 ENCOUNTER — Telehealth (INDEPENDENT_AMBULATORY_CARE_PROVIDER_SITE_OTHER): Payer: 59 | Admitting: Nurse Practitioner

## 2020-01-14 ENCOUNTER — Encounter: Payer: Self-pay | Admitting: Nurse Practitioner

## 2020-01-14 DIAGNOSIS — Z8616 Personal history of COVID-19: Secondary | ICD-10-CM | POA: Insufficient documentation

## 2020-01-14 DIAGNOSIS — U071 COVID-19: Secondary | ICD-10-CM | POA: Diagnosis not present

## 2020-01-14 MED ORDER — ALBUTEROL SULFATE HFA 108 (90 BASE) MCG/ACT IN AERS: 2.0000 | INHALATION_SPRAY | Freq: Four times a day (QID) | RESPIRATORY_TRACT | 0 refills | Status: DC | PRN

## 2020-01-14 NOTE — Patient Instructions (Signed)
COVID-19 COVID-19 is a respiratory infection that is caused by a virus called severe acute respiratory syndrome coronavirus 2 (SARS-CoV-2). The disease is also known as coronavirus disease or novel coronavirus. In some people, the virus may not cause any symptoms. In others, it may cause a serious infection. The infection can get worse quickly and can lead to complications, such as:  Pneumonia, or infection of the lungs.  Acute respiratory distress syndrome or ARDS. This is a condition in which fluid build-up in the lungs prevents the lungs from filling with air and passing oxygen into the blood.  Acute respiratory failure. This is a condition in which there is not enough oxygen passing from the lungs to the body or when carbon dioxide is not passing from the lungs out of the body.  Sepsis or septic shock. This is a serious bodily reaction to an infection.  Blood clotting problems.  Secondary infections due to bacteria or fungus.  Organ failure. This is when your body's organs stop working. The virus that causes COVID-19 is contagious. This means that it can spread from person to person through droplets from coughs and sneezes (respiratory secretions). What are the causes? This illness is caused by a virus. You may catch the virus by:  Breathing in droplets from an infected person. Droplets can be spread by a person breathing, speaking, singing, coughing, or sneezing.  Touching something, like a table or a doorknob, that was exposed to the virus (contaminated) and then touching your mouth, nose, or eyes. What increases the risk? Risk for infection You are more likely to be infected with this virus if you:  Are within 6 feet (2 meters) of a person with COVID-19.  Provide care for or live with a person who is infected with COVID-19.  Spend time in crowded indoor spaces or live in shared housing. Risk for serious illness You are more likely to become seriously ill from the virus if you:   Are 50 years of age or older. The higher your age, the more you are at risk for serious illness.  Live in a nursing home or long-term care facility.  Have cancer.  Have a long-term (chronic) disease such as: ? Chronic lung disease, including chronic obstructive pulmonary disease or asthma. ? A long-term disease that lowers your body's ability to fight infection (immunocompromised). ? Heart disease, including heart failure, a condition in which the arteries that lead to the heart become narrow or blocked (coronary artery disease), a disease which makes the heart muscle thick, weak, or stiff (cardiomyopathy). ? Diabetes. ? Chronic kidney disease. ? Sickle cell disease, a condition in which red blood cells have an abnormal "sickle" shape. ? Liver disease.  Are obese. What are the signs or symptoms? Symptoms of this condition can range from mild to severe. Symptoms may appear any time from 2 to 14 days after being exposed to the virus. They include:  A fever or chills.  A cough.  Difficulty breathing.  Headaches, body aches, or muscle aches.  Runny or stuffy (congested) nose.  A sore throat.  New loss of taste or smell. Some people may also have stomach problems, such as nausea, vomiting, or diarrhea. Other people may not have any symptoms of COVID-19. How is this diagnosed? This condition may be diagnosed based on:  Your signs and symptoms, especially if: ? You live in an area with a COVID-19 outbreak. ? You recently traveled to or from an area where the virus is common. ? You   provide care for or live with a person who was diagnosed with COVID-19. ? You were exposed to a person who was diagnosed with COVID-19.  A physical exam.  Lab tests, which may include: ? Taking a sample of fluid from the back of your nose and throat (nasopharyngeal fluid), your nose, or your throat using a swab. ? A sample of mucus from your lungs (sputum). ? Blood tests.  Imaging tests, which  may include, X-rays, CT scan, or ultrasound. How is this treated? At present, there is no medicine to treat COVID-19. Medicines that treat other diseases are being used on a trial basis to see if they are effective against COVID-19. Your health care provider will talk with you about ways to treat your symptoms. For most people, the infection is mild and can be managed at home with rest, fluids, and over-the-counter medicines. Treatment for a serious infection usually takes places in a hospital intensive care unit (ICU). It may include one or more of the following treatments. These treatments are given until your symptoms improve.  Receiving fluids and medicines through an IV.  Supplemental oxygen. Extra oxygen is given through a tube in the nose, a face mask, or a hood.  Positioning you to lie on your stomach (prone position). This makes it easier for oxygen to get into the lungs.  Continuous positive airway pressure (CPAP) or bi-level positive airway pressure (BPAP) machine. This treatment uses mild air pressure to keep the airways open. A tube that is connected to a motor delivers oxygen to the body.  Ventilator. This treatment moves air into and out of the lungs by using a tube that is placed in your windpipe.  Tracheostomy. This is a procedure to create a hole in the neck so that a breathing tube can be inserted.  Extracorporeal membrane oxygenation (ECMO). This procedure gives the lungs a chance to recover by taking over the functions of the heart and lungs. It supplies oxygen to the body and removes carbon dioxide. Follow these instructions at home: Lifestyle  If you are sick, stay home except to get medical care. Your health care provider will tell you how long to stay home. Call your health care provider before you go for medical care.  Rest at home as told by your health care provider.  Do not use any products that contain nicotine or tobacco, such as cigarettes, e-cigarettes, and  chewing tobacco. If you need help quitting, ask your health care provider.  Return to your normal activities as told by your health care provider. Ask your health care provider what activities are safe for you. General instructions  Take over-the-counter and prescription medicines only as told by your health care provider.  Drink enough fluid to keep your urine pale yellow.  Keep all follow-up visits as told by your health care provider. This is important. How is this prevented?  There is no vaccine to help prevent COVID-19 infection. However, there are steps you can take to protect yourself and others from this virus. To protect yourself:   Do not travel to areas where COVID-19 is a risk. The areas where COVID-19 is reported change often. To identify high-risk areas and travel restrictions, check the CDC travel website: wwwnc.cdc.gov/travel/notices  If you live in, or must travel to, an area where COVID-19 is a risk, take precautions to avoid infection. ? Stay away from people who are sick. ? Wash your hands often with soap and water for 20 seconds. If soap and water   are not available, use an alcohol-based hand sanitizer. ? Avoid touching your mouth, face, eyes, or nose. ? Avoid going out in public, follow guidance from your state and local health authorities. ? If you must go out in public, wear a cloth face covering or face mask. Make sure your mask covers your nose and mouth. ? Avoid crowded indoor spaces. Stay at least 6 feet (2 meters) away from others. ? Disinfect objects and surfaces that are frequently touched every day. This may include:  Counters and tables.  Doorknobs and light switches.  Sinks and faucets.  Electronics, such as phones, remote controls, keyboards, computers, and tablets. To protect others: If you have symptoms of COVID-19, take steps to prevent the virus from spreading to others.  If you think you have a COVID-19 infection, contact your health care  provider right away. Tell your health care team that you think you may have a COVID-19 infection.  Stay home. Leave your house only to seek medical care. Do not use public transport.  Do not travel while you are sick.  Wash your hands often with soap and water for 20 seconds. If soap and water are not available, use alcohol-based hand sanitizer.  Stay away from other members of your household. Let healthy household members care for children and pets, if possible. If you have to care for children or pets, wash your hands often and wear a mask. If possible, stay in your own room, separate from others. Use a different bathroom.  Make sure that all people in your household wash their hands well and often.  Cough or sneeze into a tissue or your sleeve or elbow. Do not cough or sneeze into your hand or into the air.  Wear a cloth face covering or face mask. Make sure your mask covers your nose and mouth. Where to find more information  Centers for Disease Control and Prevention: www.cdc.gov/coronavirus/2019-ncov/index.html  World Health Organization: www.who.int/health-topics/coronavirus Contact a health care provider if:  You live in or have traveled to an area where COVID-19 is a risk and you have symptoms of the infection.  You have had contact with someone who has COVID-19 and you have symptoms of the infection. Get help right away if:  You have trouble breathing.  You have pain or pressure in your chest.  You have confusion.  You have bluish lips and fingernails.  You have difficulty waking from sleep.  You have symptoms that get worse. These symptoms may represent a serious problem that is an emergency. Do not wait to see if the symptoms will go away. Get medical help right away. Call your local emergency services (911 in the [place] Do not drive yourself to the hospital. Let the emergency medical personnel know if you think you have COVID-19. Summary  COVID-19 is a  respiratory infection that is caused by a virus. It is also known as coronavirus disease or novel coronavirus. It can cause serious infections, such as pneumonia, acute respiratory distress syndrome, acute respiratory failure, or sepsis.  The virus that causes COVID-19 is contagious. This means that it can spread from person to person through droplets from breathing, speaking, singing, coughing, or sneezing.  You are more likely to develop a serious illness if you are 50 years of age or older, have a weak immune system, live in a nursing home, or have chronic disease.  There is no medicine to treat COVID-19. Your health care provider will talk with you about ways to treat your symptoms.    Take steps to protect yourself and others from infection. Wash your hands often and disinfect objects and surfaces that are frequently touched every day. Stay away from people who are sick and wear a mask if you are sick. This information is not intended to replace advice given to you by your health care provider. Make sure you discuss any questions you have with your health care provider. Document Revised: 02/22/2019 Document Reviewed: 05/31/2018 [employer] Patient Education  2020 [employer]  

## 2020-01-14 NOTE — Progress Notes (Signed)
Temp 98.3 F (36.8 C) (Oral)   Wt 260 lb (117.9 kg)   BMI 38.51 kg/m    Subjective:    Patient ID: Rodney Chary [name], male    DOB: 03/16/1961, 59 y.o.   MRN: [id number]  HPI: Rodney Mihelich. is a 59 y.o. male  Chief Complaint  Patient presents with  . Cough    pt states has had a positive covid test last Sunday.     . This visit was completed via telephone due to the restrictions of the COVID-19 pandemic. All issues as above were discussed and addressed but no physical exam was performed. If it was felt that the patient should be evaluated in the office, they were directed there. The patient verbally consented to this visit. Patient was unable to complete an audio/visual visit due to Technical difficulties,Lack of internet. Due to the catastrophic nature of the COVID-19 pandemic, this visit was done through audio contact only. . Location of the patient: home . Location of the provider: work . Those involved with this call:  . Provider: Aura Dials, DNP . CMA: Wilhemena Durie, CMA . Front Desk/Registration: Adela Ports  . Time spent on call: 20 minutes on the phone discussing health concerns. 15 minutes total spent in review of patient's record and preparation of their chart.  . I verified patient identity using two factors (patient name and date of birth). Patient consents verbally to being seen via telemedicine visit today.    COVID POSITIVE: Symptoms started on 01/04/20 and did home self test which was positive.  Was offered MAB infusion, but refused this.  Is unvaccinated.  Has had some nausea, which is improving.  Did have some loss of taste, but no loss of smell.  Recently started to be able to drink and eat a bit better.  Reports overall symptoms are improving at this time. Fever: no Cough: yes, more at night time Shortness of breath: no Wheezing: no Chest pain: no Chest tightness: no Chest congestion: no Nasal congestion: no Runny nose: no Post nasal  drip: no Sneezing: no Sore throat: no Swollen glands: no Sinus pressure: no Headache: no Face pain: no Toothache: no Ear pain: none Ear pressure: none Eyes red/itching:no Eye drainage/crusting: no  Vomiting: no Rash: no Fatigue: yes Sick contacts: no Strep contacts: no  Context: better Recurrent sinusitis: no Relief with OTC cold/cough medications: no  Treatments attempted: Tessalon and Albuterol   Relevant past medical, surgical, family and social history reviewed and updated as indicated. Interim medical history since our last visit reviewed. Allergies and medications reviewed and updated.  Review of Systems  Constitutional: Negative for activity change, diaphoresis, fatigue and fever.  Respiratory: Positive for cough (improving). Negative for chest tightness, shortness of breath and wheezing.   Cardiovascular: Negative for chest pain, palpitations and leg swelling.  Gastrointestinal: Negative.   Endocrine: Negative for cold intolerance, heat intolerance, polydipsia, polyphagia and polyuria.  Musculoskeletal: Negative.   Skin: Negative.   Neurological: Negative for dizziness, syncope, weakness, light-headedness, numbness and headaches.  Psychiatric/Behavioral: Negative.     Per HPI unless specifically indicated above     Objective:    Temp 98.3 F (36.8 C) (Oral)   Wt 260 lb (117.9 kg)   BMI 38.51 kg/m   Wt Readings from Last 3 Encounters:  01/14/20 260 lb (117.9 kg)  11/25/19 264 lb 12.8 oz (120.1 kg)  10/18/19 265 lb (120.2 kg)    Physical Exam   Unable to perform due to  telephone visit only  Results for orders placed or performed in visit on 12/30/19  CoaguChek XS/INR Waived  Result Value Ref Range   INR 4.4 (H) 0.9 - 1.1   Prothrombin Time 52.3 sec  Bayer DCA Hb A1c Waived  Result Value Ref Range   HB A1C (BAYER DCA - WAIVED) 9.6 (H) <7.0 %  CBC with Differential/Platelet  Result Value Ref Range   WBC 9.6 3.4 - 10.8 x10E3/uL   RBC 5.03 4.14 -  5.80 x10E6/uL   Hemoglobin 14.3 13.0 - 17.7 g/dL   Hematocrit 94.1 74.0 - 51.0 %   MCV 88 79 - 97 fL   MCH 28.4 26.6 - 33.0 pg   MCHC 32.5 31 - 35 g/dL   RDW 81.4 48.1 - 85.6 %   Platelets 273 150 - 450 x10E3/uL   Neutrophils 68 Not Estab. %   Lymphs 22 Not Estab. %   Monocytes 7 Not Estab. %   Eos 1 Not Estab. %   Basos 1 Not Estab. %   Neutrophils Absolute 6.6 1 - 7 x10E3/uL   Lymphocytes Absolute 2.1 0 - 3 x10E3/uL   Monocytes Absolute 0.7 0 - 0 x10E3/uL   EOS (ABSOLUTE) 0.1 0.0 - 0.4 x10E3/uL   Basophils Absolute 0.1 0 - 0 x10E3/uL   Immature Granulocytes 1 Not Estab. %   Immature Grans (Abs) 0.1 0.0 - 0.1 x10E3/uL  Basic metabolic panel  Result Value Ref Range   Glucose 412 (H) 65 - 99 mg/dL   BUN 37 (H) 6 - 24 mg/dL   Creatinine, Ser 3.14 (H) 0.76 - 1.27 mg/dL   GFR calc non Af Amer 45 (L) >59 mL/min/1.73   GFR calc Af Amer 52 (L) >59 mL/min/1.73   BUN/Creatinine Ratio 23 (H) 9 - 20   Sodium 134 134 - 144 mmol/L   Potassium 4.4 3.5 - 5.2 mmol/L   Chloride 95 (L) 96 - 106 mmol/L   CO2 22 20 - 29 mmol/L   Calcium 9.7 8.7 - 10.2 mg/dL  Lipid Panel w/o Chol/HDL Ratio  Result Value Ref Range   Cholesterol, Total 203 (H) 100 - 199 mg/dL   Triglycerides 970 (HH) 0 - 149 mg/dL   HDL 24 (L) >26 mg/dL   VLDL Cholesterol Cal 97 (H) 5 - 40 mg/dL   LDL Chol Calc (NIH) 82 0 - 99 mg/dL      Assessment & Plan:   Problem List Items Addressed This Visit      Other   Lab test positive for detection of COVID-19 virus    With symptoms starting 01/04/20 and positive test at home 01/05/20.  He reports symptoms are improving with no SOB or increased cough.  Educated him on need to self quarantine for full 10 day period, if symptoms improving he may return to work on Friday.  Continue Albuterol and Tessalon as needed + OTC supplements (Zinc, Vitamin D, Vitamin C).  He missed new patient endocrinology visit due to Covid, recommended he reschedule this.  Will plan on lab visit next week,  since missed recent labs.  Return to office as schedule or for worsening/ongoing symptoms.           I discussed the assessment and treatment plan with the patient. The patient was provided an opportunity to ask questions and all were answered. The patient agreed with the plan and demonstrated an understanding of the instructions.   The patient was advised to call back or seek an in-person evaluation if  the symptoms worsen or if the condition fails to improve as anticipated.   I provided 21+ minutes of time during this encounter.  Follow up plan: Return for as scheduled.

## 2020-01-14 NOTE — Assessment & Plan Note (Signed)
With symptoms starting 01/04/20 and positive test at home 01/05/20.  He reports symptoms are improving with no SOB or increased cough.  Educated him on need to self quarantine for full 10 day period, if symptoms improving he may return to work on Friday.  Continue Albuterol and Tessalon as needed + OTC supplements (Zinc, Vitamin D, Vitamin C).  He missed new patient endocrinology visit due to Covid, recommended he reschedule this.  Will plan on lab visit next week, since missed recent labs.  Return to office as schedule or for worsening/ongoing symptoms.

## 2020-01-21 ENCOUNTER — Other Ambulatory Visit: Payer: Self-pay

## 2020-01-21 ENCOUNTER — Other Ambulatory Visit: Payer: 59

## 2020-01-21 ENCOUNTER — Telehealth (INDEPENDENT_AMBULATORY_CARE_PROVIDER_SITE_OTHER): Payer: 59 | Admitting: Family Medicine

## 2020-01-21 ENCOUNTER — Encounter: Payer: Self-pay | Admitting: Family Medicine

## 2020-01-21 VITALS — Ht 68.0 in | Wt 260.0 lb

## 2020-01-21 DIAGNOSIS — Z954 Presence of other heart-valve replacement: Secondary | ICD-10-CM | POA: Diagnosis not present

## 2020-01-21 LAB — COAGUCHEK XS/INR WAIVED
INR: 4.9 — ABNORMAL HIGH (ref 0.9–1.1)
Prothrombin Time: 58.9 s

## 2020-01-21 NOTE — Progress Notes (Addendum)
Ht 5\' 8"  (1.727 m)   Wt 260 lb (117.9 kg)   BMI 39.53 kg/m    Subjective:    Patient ID: [name], male    DOB: Apr 08, 1961, 59 y.o.   MRN: [id number]  CC: Coumadin management  HPI: This patient is a 59 y.o. male who presents for coumadin management. The expected duration of coumadin treatment is lifelong The reason for anticoagulation is  mechanical heart valve.  Present Coumadin dose: 7mg  daily Goal: 2.5-3.5  Excessive bruising: no Nose bleeding: no Rectal bleeding: no Prolonged menstrual cycles: N/A Eating diet with consistent amounts of foods containing Vitamin K:no Any recent antibiotic use? no  Relevant past medical, surgical, family and social history reviewed and updated as indicated. Interim medical history since our last visit reviewed. Allergies and medications reviewed and updated.  ROS: Per HPI unless specifically indicated above     Objective:    Ht 5\' 8"  (1.727 m)   Wt 260 lb (117.9 kg)   BMI 39.53 kg/m   Wt Readings from Last 3 Encounters:  01/21/20 260 lb (117.9 kg)  01/14/20 260 lb (117.9 kg)  11/25/19 264 lb 12.8 oz (120.1 kg)     General: Well appearing, well nourished in no distress.  Normal mood and affect. Skin: No excessive bruising or rash  Last INR: 5.1 Last PT: 58.9    Last CBC:  Lab Results  Component Value Date   WBC 9.6 12/30/2019   HGB 14.3 12/30/2019   HCT 44.0 12/30/2019   MCV 88 12/30/2019   PLT 273 12/30/2019    Results for orders placed or performed in visit on 12/30/19  CoaguChek XS/INR Waived  Result Value Ref Range   INR 4.4 (H) 0.9 - 1.1   Prothrombin Time 52.3 sec  Bayer DCA Hb A1c Waived  Result Value Ref Range   HB A1C (BAYER DCA - WAIVED) 9.6 (H) <7.0 %  CBC with Differential/Platelet  Result Value Ref Range   WBC 9.6 3.4 - 10.8 x10E3/uL   RBC 5.03 4.14 - 5.80 x10E6/uL   Hemoglobin 14.3 13.0 - 17.7 g/dL   Hematocrit 01/01/2020 01/01/2020 - 51.0 %   MCV 88 79 - 97 fL   MCH 28.4 26.6 - 33.0 pg   MCHC  32.5 31 - 35 g/dL   RDW 01/01/20 90.2 - 40.9 %   Platelets 273 150 - 450 x10E3/uL   Neutrophils 68 Not Estab. %   Lymphs 22 Not Estab. %   Monocytes 7 Not Estab. %   Eos 1 Not Estab. %   Basos 1 Not Estab. %   Neutrophils Absolute 6.6 1 - 7 x10E3/uL   Lymphocytes Absolute 2.1 0 - 3 x10E3/uL   Monocytes Absolute 0.7 0 - 0 x10E3/uL   EOS (ABSOLUTE) 0.1 0.0 - 0.4 x10E3/uL   Basophils Absolute 0.1 0 - 0 x10E3/uL   Immature Granulocytes 1 Not Estab. %   Immature Grans (Abs) 0.1 0.0 - 0.1 x10E3/uL  Basic metabolic panel  Result Value Ref Range   Glucose 412 (H) 65 - 99 mg/dL   BUN 37 (H) 6 - 24 mg/dL   Creatinine, Ser 73.5 (H) 0.76 - 1.27 mg/dL   GFR calc non Af Amer 45 (L) >59 mL/min/1.73   GFR calc Af Amer 52 (L) >59 mL/min/1.73   BUN/Creatinine Ratio 23 (H) 9 - 20   Sodium 134 134 - 144 mmol/L   Potassium 4.4 3.5 - 5.2 mmol/L   Chloride 95 (L) 96 -  106 mmol/L   CO2 22 20 - 29 mmol/L   Calcium 9.7 8.7 - 10.2 mg/dL  Lipid Panel w/o Chol/HDL Ratio  Result Value Ref Range   Cholesterol, Total 203 (H) 100 - 199 mg/dL   Triglycerides 240 (HH) 0 - 149 mg/dL   HDL 24 (L) >97 mg/dL   VLDL Cholesterol Cal 97 (H) 5 - 40 mg/dL   LDL Chol Calc (NIH) 82 0 - 99 mg/dL       Assessment:     [id number]   1. S/P aortic valve replacement with metallic valve  Z95.4     Plan:   Discussed current plan via virtual visit with patient. For coumadin dosing, elected to change dose to 6mg . Will plan to recheck INR in Friday.   . This visit was completed via MyChart due to the restrictions of the COVID-19 pandemic. All issues as above were discussed and addressed. Physical exam was done as above through visual confirmation on MyChart. If it was felt that the patient should be evaluated in the office, they were directed there. The patient verbally consented to this visit. . Location of the patient: home . Location of the provider: work . Those involved with this call:  . Provider: Saturday,  DO . CMA: Olevia Perches, CMA . Front Desk/Registration: Wilhemena Durie  . Time spent on call: 15 minutes with patient face to face via video conference. More than 50% of this time was spent in counseling and coordination of care. 23 minutes total spent in review of patient's record and preparation of their chart.

## 2020-01-21 NOTE — Progress Notes (Signed)
Please let Mr Dupee know his INR has increased to 4.9 from 4.4 -- he will need call.  He was recently started on medication for mood, which may be influencing this or his recent Covid may be influencing it.  Want him to hold Coumadin tonight and tomorrow night (Wednesday and Thursday night) then he needs to ensure to keep appointment with Dr. Laural Benes on Friday for recheck -- will determine new dose at that time.  Recheck INR on Friday.  Is to alert provider if any bleeding or go immediately to ER.

## 2020-01-24 ENCOUNTER — Other Ambulatory Visit: Payer: Self-pay

## 2020-01-24 ENCOUNTER — Ambulatory Visit (INDEPENDENT_AMBULATORY_CARE_PROVIDER_SITE_OTHER): Payer: 59 | Admitting: Family Medicine

## 2020-01-24 ENCOUNTER — Encounter: Payer: Self-pay | Admitting: Family Medicine

## 2020-01-24 VITALS — BP 139/78 | HR 97 | Temp 97.8°F | Ht 68.0 in | Wt 253.0 lb

## 2020-01-24 DIAGNOSIS — Z954 Presence of other heart-valve replacement: Secondary | ICD-10-CM

## 2020-01-24 LAB — COAGUCHEK XS/INR WAIVED
INR: 1.5 — ABNORMAL HIGH (ref 0.9–1.1)
Prothrombin Time: 17.5 s

## 2020-01-24 NOTE — Progress Notes (Signed)
   BP 139/78 (BP Location: Right Arm, Patient Position: Sitting)   Pulse 97   Temp 97.8 F (36.6 C) (Oral)   Ht 5\' 8"  (1.727 m)   Wt 253 lb (114.8 kg)   SpO2 95%   BMI 38.47 kg/m    Subjective:    Patient ID: [name], male    DOB: April 22, 1961, 59 y.o.   MRN: [id number]  CC: Coumadin management  HPI: This patient is a 58 y.o. male who presents for coumadin management. The expected duration of coumadin treatment is lifelong The reason for anticoagulation is  mechanical heart valve.  Present Coumadin dose: has not been taking any of his coumadin since Tuesday. Had been taking 7mg  daily prior to that Goal: 2.5-3.5  Excessive bruising: no Nose bleeding: no Rectal bleeding: no Prolonged menstrual cycles: N/A Eating diet with consistent amounts of foods containing Vitamin K:yes Any recent antibiotic use? no  Relevant past medical, surgical, family and social history reviewed and updated as indicated. Interim medical history since our last visit reviewed. Allergies and medications reviewed and updated.  ROS: Per HPI unless specifically indicated above     Objective:    BP 139/78 (BP Location: Right Arm, Patient Position: Sitting)   Pulse 97   Temp 97.8 F (36.6 C) (Oral)   Ht 5\' 8"  (1.727 m)   Wt 253 lb (114.8 kg)   SpO2 95%   BMI 38.47 kg/m   Wt Readings from Last 3 Encounters:  01/24/20 253 lb (114.8 kg)  01/21/20 260 lb (117.9 kg)  01/14/20 260 lb (117.9 kg)     General: Well appearing, well nourished in no distress.  Normal mood and affect. Skin: No excessive bruising or rash  Last INR: 1.5 Last PT: 17.5    Last CBC:  Lab Results  Component Value Date   WBC 9.6 12/30/2019   HGB 14.3 12/30/2019   HCT 44.0 12/30/2019   MCV 88 12/30/2019   PLT 273 12/30/2019    Results for orders placed or performed in visit on 01/21/20  CoaguChek XS/INR Waived  Result Value Ref Range   INR 4.9 (H) 0.9 - 1.1   Prothrombin Time 58.9 sec       Assessment:      ICD-10-CM   1. S/P aortic valve replacement with metallic valve  Z95.4 CoaguChek XS/INR Waived    Plan:   Discussed current plan face-to-face with patient. For coumadin dosing, elected to change dose to 6mg  daily and recheck on Monday. Will plan to recheck INR in Monday.

## 2020-01-27 ENCOUNTER — Encounter: Payer: Self-pay | Admitting: Family Medicine

## 2020-01-27 ENCOUNTER — Other Ambulatory Visit: Payer: Self-pay

## 2020-01-27 ENCOUNTER — Ambulatory Visit (INDEPENDENT_AMBULATORY_CARE_PROVIDER_SITE_OTHER): Payer: 59 | Admitting: Family Medicine

## 2020-01-27 VITALS — BP 121/75 | HR 90 | Temp 98.3°F | Wt 253.0 lb

## 2020-01-27 DIAGNOSIS — I482 Chronic atrial fibrillation, unspecified: Secondary | ICD-10-CM

## 2020-01-27 LAB — COAGUCHEK XS/INR WAIVED
INR: 1.7 — ABNORMAL HIGH (ref 0.9–1.1)
Prothrombin Time: 19.8 s

## 2020-01-27 NOTE — Progress Notes (Signed)
   BP 121/75   Pulse 90   Temp 98.3 F (36.8 C) (Oral)   Wt 253 lb (114.8 kg)   SpO2 96%   BMI 38.47 kg/m    Subjective:    Patient ID: Rodney Chary [name], male    DOB: Jan 07, 1961, 59 y.o.   MRN: [id number]  CC: Coumadin management  HPI: This patient is a 59 y.o. male who presents for coumadin management. The expected duration of coumadin treatment is lifelong The reason for anticoagulation is  mechanical heart valve.  Present Coumadin dose: 6 mg daily Goal: 2.5-3.5  Excessive bruising: no Nose bleeding: no Rectal bleeding: no Prolonged menstrual cycles: N/A Eating diet with consistent amounts of foods containing Vitamin K:yes Any recent antibiotic use? no  Relevant past medical, surgical, family and social history reviewed and updated as indicated. Interim medical history since our last visit reviewed. Allergies and medications reviewed and updated.  ROS: Per HPI unless specifically indicated above     Objective:    BP 121/75   Pulse 90   Temp 98.3 F (36.8 C) (Oral)   Wt 253 lb (114.8 kg)   SpO2 96%   BMI 38.47 kg/m   Wt Readings from Last 3 Encounters:  01/27/20 253 lb (114.8 kg)  01/24/20 253 lb (114.8 kg)  01/21/20 260 lb (117.9 kg)     General: Well appearing, well nourished in no distress.  Normal mood and affect. Skin: No excessive bruising or rash  Last INR: 1.7 Last PT: 19.8    Last CBC:  Lab Results  Component Value Date   WBC 9.6 12/30/2019   HGB 14.3 12/30/2019   HCT 44.0 12/30/2019   MCV 88 12/30/2019   PLT 273 12/30/2019    Results for orders placed or performed in visit on 01/24/20  CoaguChek XS/INR Waived  Result Value Ref Range   INR 1.5 (H) 0.9 - 1.1   Prothrombin Time 17.5 sec       Assessment:     ICD-10-CM   1. Chronic atrial fibrillation (HCC)  I48.20 CoaguChek XS/INR Waived    Plan:   Discussed current plan face-to-face with patient. For coumadin dosing, elected to continue current dose. Will plan to recheck INR  in 3 days.

## 2020-01-30 ENCOUNTER — Other Ambulatory Visit: Payer: Self-pay

## 2020-01-30 ENCOUNTER — Ambulatory Visit (INDEPENDENT_AMBULATORY_CARE_PROVIDER_SITE_OTHER): Payer: 59 | Admitting: Family Medicine

## 2020-01-30 ENCOUNTER — Encounter: Payer: Self-pay | Admitting: Family Medicine

## 2020-01-30 VITALS — BP 138/87 | HR 92 | Temp 98.0°F | Wt 253.0 lb

## 2020-01-30 DIAGNOSIS — I482 Chronic atrial fibrillation, unspecified: Secondary | ICD-10-CM | POA: Diagnosis not present

## 2020-01-30 LAB — COAGUCHEK XS/INR WAIVED
INR: 2.2 — ABNORMAL HIGH (ref 0.9–1.1)
Prothrombin Time: 26.2 s

## 2020-01-30 MED ORDER — WARFARIN SODIUM 2.5 MG PO TABS: 2.5000 mg | ORAL_TABLET | Freq: Every day | ORAL | 1 refills | Status: DC

## 2020-01-30 MED ORDER — WARFARIN SODIUM 4 MG PO TABS: 4.0000 mg | ORAL_TABLET | Freq: Every day | ORAL | 1 refills | Status: DC

## 2020-01-30 NOTE — Progress Notes (Signed)
   BP 138/87   Pulse 92   Temp 98 F (36.7 C) (Oral)   Wt 253 lb (114.8 kg)   SpO2 97%   BMI 38.47 kg/m    Subjective:    Patient ID: Rodney Chary [name], male    DOB: 07/18/1960, 59 y.o.   MRN: [id number]  CC: Coumadin management  HPI: This patient is a 59 y.o. male who presents for coumadin management. The expected duration of coumadin treatment is lifelong The reason for anticoagulation is  mechanical heart valve.  Present Coumadin dose: 6mg  daily Goal: 2.5-3.5  Excessive bruising: no Nose bleeding: no Rectal bleeding: no Prolonged menstrual cycles: N/A Eating diet with consistent amounts of foods containing Vitamin K:yes Any recent antibiotic use? no  Relevant past medical, surgical, family and social history reviewed and updated as indicated. Interim medical history since our last visit reviewed. Allergies and medications reviewed and updated.  ROS: Per HPI unless specifically indicated above     Objective:    BP 138/87   Pulse 92   Temp 98 F (36.7 C) (Oral)   Wt 253 lb (114.8 kg)   SpO2 97%   BMI 38.47 kg/m   Wt Readings from Last 3 Encounters:  01/30/20 253 lb (114.8 kg)  01/27/20 253 lb (114.8 kg)  01/24/20 253 lb (114.8 kg)     General: Well appearing, well nourished in no distress.  Normal mood and affect. Skin: No excessive bruising or rash  Last INR: 2.2 Last PT: 26.2    Last CBC:  Lab Results  Component Value Date   WBC 9.6 12/30/2019   HGB 14.3 12/30/2019   HCT 44.0 12/30/2019   MCV 88 12/30/2019   PLT 273 12/30/2019    Results for orders placed or performed in visit on 01/27/20  CoaguChek XS/INR Waived  Result Value Ref Range   INR 1.7 (H) 0.9 - 1.1   Prothrombin Time 19.8 sec       Assessment:     ICD-10-CM   1. Chronic atrial fibrillation (HCC)  I48.20 CoaguChek XS/INR Waived    Plan:   Discussed current plan face-to-face with patient. For coumadin dosing, elected to change dose to 6.5mg  daily. Will plan to recheck INR  in 1 week.

## 2020-02-06 ENCOUNTER — Encounter: Payer: Self-pay | Admitting: Family Medicine

## 2020-02-06 ENCOUNTER — Other Ambulatory Visit: Payer: Self-pay

## 2020-02-06 ENCOUNTER — Ambulatory Visit (INDEPENDENT_AMBULATORY_CARE_PROVIDER_SITE_OTHER): Payer: 59 | Admitting: Family Medicine

## 2020-02-06 VITALS — BP 129/87 | HR 93 | Temp 97.7°F | Wt 256.0 lb

## 2020-02-06 DIAGNOSIS — G459 Transient cerebral ischemic attack, unspecified: Secondary | ICD-10-CM | POA: Diagnosis not present

## 2020-02-06 DIAGNOSIS — R791 Abnormal coagulation profile: Secondary | ICD-10-CM | POA: Diagnosis not present

## 2020-02-06 DIAGNOSIS — E1165 Type 2 diabetes mellitus with hyperglycemia: Secondary | ICD-10-CM | POA: Diagnosis not present

## 2020-02-06 DIAGNOSIS — G453 Amaurosis fugax: Secondary | ICD-10-CM | POA: Insufficient documentation

## 2020-02-06 DIAGNOSIS — Z954 Presence of other heart-valve replacement: Secondary | ICD-10-CM

## 2020-02-06 DIAGNOSIS — I482 Chronic atrial fibrillation, unspecified: Secondary | ICD-10-CM

## 2020-02-06 DIAGNOSIS — Z794 Long term (current) use of insulin: Secondary | ICD-10-CM

## 2020-02-06 LAB — COAGUCHEK XS/INR WAIVED
INR: 2.1 — ABNORMAL HIGH (ref 0.9–1.1)
Prothrombin Time: 25.2 s

## 2020-02-06 MED ORDER — TRESIBA FLEXTOUCH 100 UNIT/ML ~~LOC~~ SOPN
55.0000 [IU] | PEN_INJECTOR | Freq: Every day | SUBCUTANEOUS | Status: DC
Start: 2020-02-06 — End: 2020-07-23

## 2020-02-06 NOTE — Progress Notes (Deleted)
   BP 129/87   Pulse 93   Temp 97.7 F (36.5 C) (Oral)   Wt 256 lb (116.1 kg)   SpO2 96%   BMI 38.92 kg/m    Subjective:    Patient ID: Rodney Chary [name], male    DOB: Sep 14, 1960, 59 y.o.   MRN: [id number]  CC: Coumadin management  HPI: This patient is a 59 y.o. male who presents for coumadin management. The expected duration of coumadin treatment is lifelong The reason for anticoagulation is  mechanical heart valve.  Present Coumadin dose: 6.68md daily Goal: 2.5-3.5  Excessive bruising: no Nose bleeding: no Rectal bleeding: no Prolonged menstrual cycles: N/A Eating diet with consistent amounts of foods containing Vitamin K:yes Any recent antibiotic use? no  Relevant past medical, surgical, family and social history reviewed and updated as indicated. Interim medical history since our last visit reviewed. Allergies and medications reviewed and updated.  ROS: Per HPI unless specifically indicated above     Objective:    BP 129/87   Pulse 93   Temp 97.7 F (36.5 C) (Oral)   Wt 256 lb (116.1 kg)   SpO2 96%   BMI 38.92 kg/m   Wt Readings from Last 3 Encounters:  02/06/20 256 lb (116.1 kg)  01/30/20 253 lb (114.8 kg)  01/27/20 253 lb (114.8 kg)     General: Well appearing, well nourished in no distress.  Normal mood and affect. Skin: No excessive bruising or rash  Last INR:     Last CBC:  Lab Results  Component Value Date   WBC 9.6 12/30/2019   HGB 14.3 12/30/2019   HCT 44.0 12/30/2019   MCV 88 12/30/2019   PLT 273 12/30/2019    Results for orders placed or performed in visit on 01/30/20  CoaguChek XS/INR Waived  Result Value Ref Range   INR 2.2 (H) 0.9 - 1.1   Prothrombin Time 26.2 sec       Assessment:     ICD-10-CM   1. Chronic atrial fibrillation (HCC)  I48.20 CoaguChek XS/INR Waived    Plan:   Discussed current plan face-to-face with patient. For coumadin dosing, elected to {Blank single:19197::"continue current dose","change dose  to","hold dose"}. Will plan to recheck INR in {Blank single:19197::"1 month","1 week","2 weeks"}.

## 2020-02-06 NOTE — Assessment & Plan Note (Signed)
INR still running low at 2.1- will increase to 7mg  coumadin daily and recheck 1 week.

## 2020-02-06 NOTE — Progress Notes (Signed)
BP 129/87   Pulse 93   Temp 97.7 F (36.5 C) (Oral)   Wt 256 lb (116.1 kg)   SpO2 96%   BMI 38.92 kg/m    Subjective:    Patient ID: Rodney Chary [name], male    DOB: 1961/02/04, 59 y.o.   MRN: [id number]  HPI: Rodney Cazarez. is a 59 y.o. male  Chief Complaint  Patient presents with  . Coagulation Disorder   HPI: This patient is a 59 y.o. male who presents for coumadin management. The expected duration of coumadin treatment is lifelong The reason for anticoagulation is  mechanical heart valve.  Present Coumadin dose: 6.43md daily Goal: 2.5-3.5  Excessive bruising: no Nose bleeding: no Rectal bleeding: no Prolonged menstrual cycles: N/A Eating diet with consistent amounts of foods containing Vitamin K:yes Any recent antibiotic use? no  DIABETES- needs diabetic shoes Hypoglycemic episodes:no Polydipsia/polyuria: yes Visual disturbance: yes Chest pain: no Paresthesias: yes Glucose Monitoring: yes  Accucheck frequency: TID  Fasting glucose: 290  Post prandial: 560 Taking Insulin?: yes Blood Pressure Monitoring: not checking Retinal Examination: Not up to Date Foot Exam: Up to Date Diabetic Education: Completed Pneumovax: Up to Date Influenza: Not up to Date Aspirin: no   Earlier this week he lost all vision in his R eye for about 3-5 minutes. He had no warning. Felt fine afterwards. No other concerns or complaints.   Relevant past medical, surgical, family and social history reviewed and updated as indicated. Interim medical history since our last visit reviewed. Allergies and medications reviewed and updated.  Review of Systems  Constitutional: Negative.   HENT: Negative.   Eyes:       Loss of vision in the R eye   Respiratory: Negative.   Cardiovascular: Negative.   Gastrointestinal: Negative.   Musculoskeletal: Positive for back pain. Negative for arthralgias, gait problem, joint swelling, myalgias, neck pain and neck stiffness.  Skin: Negative.     Psychiatric/Behavioral: Negative.     Per HPI unless specifically indicated above     Objective:    BP 129/87   Pulse 93   Temp 97.7 F (36.5 C) (Oral)   Wt 256 lb (116.1 kg)   SpO2 96%   BMI 38.92 kg/m   Wt Readings from Last 3 Encounters:  02/06/20 256 lb (116.1 kg)  01/30/20 253 lb (114.8 kg)  01/27/20 253 lb (114.8 kg)    Physical Exam Vitals and nursing note reviewed.  Constitutional:      General: He is not in acute distress.    Appearance: Normal appearance. He is not ill-appearing, toxic-appearing or diaphoretic.  HENT:     Head: Normocephalic and atraumatic.     Right Ear: External ear normal.     Left Ear: External ear normal.     Nose: Nose normal.     Mouth/Throat:     Mouth: Mucous membranes are moist.     Pharynx: Oropharynx is clear.  Eyes:     General: No scleral icterus.       Right eye: No discharge.        Left eye: No discharge.     Extraocular Movements: Extraocular movements intact.     Conjunctiva/sclera: Conjunctivae normal.     Pupils: Pupils are equal, round, and reactive to light.  Cardiovascular:     Rate and Rhythm: Normal rate and regular rhythm.     Pulses: Normal pulses.     Heart sounds: Normal heart sounds. No murmur heard.  No friction rub. No gallop.   Pulmonary:     Effort: Pulmonary effort is normal. No respiratory distress.     Breath sounds: Normal breath sounds. No stridor. No wheezing, rhonchi or rales.  Chest:     Chest wall: No tenderness.  Musculoskeletal:        General: Normal range of motion.     Cervical back: Normal range of motion and neck supple.  Skin:    General: Skin is warm and dry.     Capillary Refill: Capillary refill takes less than 2 seconds.     Coloration: Skin is not jaundiced or pale.     Findings: No bruising, erythema, lesion or rash.  Neurological:     General: No focal deficit present.     Mental Status: He is alert and oriented to person, place, and time. Mental status is at baseline.   Psychiatric:        Mood and Affect: Mood normal.        Behavior: Behavior normal.        Thought Content: Thought content normal.        Judgment: Judgment normal.    INR: 2.1 PT: 25.2  Results for orders placed or performed in visit on 01/30/20  CoaguChek XS/INR Waived  Result Value Ref Range   INR 2.2 (H) 0.9 - 1.1   Prothrombin Time 26.2 sec      Assessment & Plan:   Problem List Items Addressed This Visit      Cardiovascular and Mediastinum   Chronic atrial fibrillation (HCC)   Relevant Orders   CoaguChek XS/INR Waived   CT Head Wo Contrast   Amaurosis fugax    Short term. Lasted a few minutes. Will get him CT head to make sure that this was a TIA and not a stroke. Await results.         Other   S/P aortic valve replacement with metallic valve    INR still running low at 2.1- will increase to 7mg  coumadin daily and recheck 1 week.       Other Visit Diagnoses    TIA (transient ischemic attack)    -  Primary   Given vision loss, concern for TIA vs stroke- will obtain stat CT head. Await results. Will get sugars and INR under control. Continue to monitor.    Relevant Orders   CT Head Wo Contrast   Type 2 diabetes mellitus with hyperglycemia, with long-term current use of insulin (HCC)       Sugar has been running very high. Will increase his tresiba to 55units daily and recheck 1 week. Call with any concerns.    Relevant Medications   insulin degludec (TRESIBA FLEXTOUCH) 100 UNIT/ML FlexTouch Pen   Other Relevant Orders   CT Head Wo Contrast   Subtherapeutic international normalized ratio (INR)       Still sitting at 2.1- will increase to 7mg  daily and recheck 1 week.    Relevant Orders   CT Head Wo Contrast       Follow up plan: Return in about 1 week (around 02/13/2020).

## 2020-02-06 NOTE — Assessment & Plan Note (Signed)
Short term. Lasted a few minutes. Will get him CT head to make sure that this was a TIA and not a stroke. Await results.

## 2020-02-07 ENCOUNTER — Ambulatory Visit
Admission: RE | Admit: 2020-02-07 | Discharge: 2020-02-07 | Disposition: A | Discharge status: Home / Self Care | Payer: 59 | Source: Ambulatory Visit | Attending: Family Medicine | Admitting: Family Medicine

## 2020-02-07 DIAGNOSIS — G459 Transient cerebral ischemic attack, unspecified: Secondary | ICD-10-CM | POA: Diagnosis present

## 2020-02-07 DIAGNOSIS — E1165 Type 2 diabetes mellitus with hyperglycemia: Secondary | ICD-10-CM | POA: Insufficient documentation

## 2020-02-07 DIAGNOSIS — Z794 Long term (current) use of insulin: Secondary | ICD-10-CM | POA: Diagnosis present

## 2020-02-07 DIAGNOSIS — R791 Abnormal coagulation profile: Secondary | ICD-10-CM | POA: Insufficient documentation

## 2020-02-07 DIAGNOSIS — I482 Chronic atrial fibrillation, unspecified: Secondary | ICD-10-CM | POA: Insufficient documentation

## 2020-02-10 ENCOUNTER — Telehealth: Payer: Self-pay | Admitting: Nurse Practitioner

## 2020-02-10 DIAGNOSIS — I6381 Other cerebral infarction due to occlusion or stenosis of small artery: Secondary | ICD-10-CM

## 2020-02-10 NOTE — Telephone Encounter (Signed)
Called patient and discussed CT head results.  Possible old stroke and some very small blood clots.  Given multiple co-morbid conditions and patient is on high risk medications, will place urgent referral to Neurology.  Patient in agreement with plan and verbalized understanding.  All questions answered while on phone call.

## 2020-02-13 ENCOUNTER — Telehealth: Payer: Self-pay

## 2020-02-13 NOTE — Telephone Encounter (Signed)
Patient advised to call [facility] to schedule appt. Patient was called on 02/11/2020 to schedule, NA.

## 2020-02-13 NOTE — Telephone Encounter (Signed)
Copied from CRM [id number]. Topic: General - Other >> Feb 13, 2020  8:49 AM Lyn Hollingshead D wrote: Pt following up on MRI appt, no one has contact him yet / please advise

## 2020-02-18 DIAGNOSIS — Z8673 Personal history of transient ischemic attack (TIA), and cerebral infarction without residual deficits: Secondary | ICD-10-CM | POA: Insufficient documentation

## 2020-02-19 ENCOUNTER — Other Ambulatory Visit: Payer: Self-pay

## 2020-02-19 ENCOUNTER — Ambulatory Visit (INDEPENDENT_AMBULATORY_CARE_PROVIDER_SITE_OTHER): Payer: 59 | Admitting: Nurse Practitioner

## 2020-02-19 ENCOUNTER — Encounter: Payer: Self-pay | Admitting: Nurse Practitioner

## 2020-02-19 VITALS — BP 130/82 | HR 78 | Temp 97.8°F | Wt 256.0 lb

## 2020-02-19 DIAGNOSIS — Z954 Presence of other heart-valve replacement: Secondary | ICD-10-CM

## 2020-02-19 LAB — COAGUCHEK XS/INR WAIVED
INR: 1.6 — ABNORMAL HIGH (ref 0.9–1.1)
Prothrombin Time: 19.1 s

## 2020-02-19 MED ORDER — WARFARIN SODIUM 5 MG PO TABS: ORAL_TABLET | ORAL | 3 refills | Status: DC

## 2020-02-19 NOTE — Patient Instructions (Signed)
Start taking 7 MG on Monday, Wednesday, Friday, Saturday, Sunday and 7.5 MG on Tuesday and Thursday.  Return in one week.  Prothrombin Time, International Normalized Ratio Test Why am I having this test? A prothrombin time (pro-time, PT) test may be ordered if:  You have certain medical conditions that cause abnormal bleeding or blood clotting. These can include: ? Liver disease. ? Systemic infection (sepsis). ? Inherited (genetic) bleeding disorders.  You are taking a medicine to prevent excessive blood clotting (anticoagulant), such as warfarin. ? If you are taking warfarin, you will likely be asked to have this test done at regular intervals. The results of this test will help your health care provider determine what dose of warfarin you need based on how quickly or slowly your blood clots. It is very important to have this test done as often as your health care provider recommends. What is being tested? A prothrombin time (pro-time, PT) test measures how many seconds it takes your blood to clot. The international normalized ratio (INR) is a calculation of blood clotting time based on your PT result. Most labs report both PT and INR values when reporting blood clotting times. What kind of sample is taken?  A blood sample is required for this test. It is usually collected by inserting a needle into a blood vessel. Tell a health care provider about:  Any blood disorders you have.  All medicines you are taking, including vitamins, herbs, eye drops, creams, and over-the-counter medicines. Do not stop, add, or change any medicines without letting your health care provider know.  The foods you regularly eat, especially foods that contain moderate or high amounts of vitamin K. It is important to eat a consistent amount of foods rich in vitamin K. Let your health care provider know if you have recently changed your diet.  If you drink alcohol. This can affect your lab results. How are the  results reported? Your test results will be reported as values. Your health care provider will compare your results to normal ranges that were established after testing a large group of people (reference ranges). Reference ranges may vary among different labs and hospitals. For this test, common reference ranges are:  Without anticoagulant treatment (control value): 11.0-12.5 seconds; 85-100%.  INR: 0.8-1.1. If you are taking warfarin, talk with your health care provider about what your INR result should be. Generally, an INR of 2.0-3.0 is desired for blood clot prevention. This depends on your medical conditions. What do the results mean?  A higher than normal PT or INR means that your blood takes longer to form a clot. This can result from: ? Certain medicines. ? Liver disease. ? Lack of certain proteins that form clots (coagulation factors). ? Lack of some vitamins.  A lower than normal PT or INR means that your blood can form a clot easily. This can result from: ? Supplements that contain Vitamin K. ? Medicines that contain estrogen, such as birth control pills or hormone replacement. ? Cancer. ? Some blood disorders (disseminated intravascular coagulation). Talk with your health care provider about what your test results mean. If you are taking warfarin or another anticoagulant, your result ranges may be different. Talk to your health care provider about what your results should be. Questions to ask your health care provider Ask your health care provider or the department that is doing the test:  When will my results be ready?  How will I get my results?  What are my treatment options?  What other tests do I need?  What are my next steps? Summary  A prothrombin time (pro-time, PT) test measures how many seconds it takes your blood to clot.  You may have this test if you have a medical condition that causes abnormal bleeding or blood clotting, or if you are taking a medicine  to prevent abnormal blood clotting.  A test result that is higher than normal indicates that your blood is taking too long to form a clot. This result may occur because you lack some vitamins, take certain medicines, or have certain medical conditions.  Talk with your health care provider about what your results mean. This information is not intended to replace advice given to you by your health care provider. Make sure you discuss any questions you have with your health care provider. Document Revised: 06/10/2017 Document Reviewed: 06/10/2017 Elsevier Patient Education  2020 [employer].

## 2020-02-19 NOTE — Progress Notes (Signed)
° °  BP 130/82    Pulse 78    Temp 97.8 F (36.6 C) (Oral)    Wt 256 lb (116.1 kg)    SpO2 98%    BMI 38.92 kg/m    Subjective:    Patient ID: Rodney Chary [name], male    DOB: July 14, 1960, 59 y.o.   MRN: [id number]  CC: Coumadin management  HPI: This patient is a 59 y.o. male who presents for coumadin management. The expected duration of coumadin treatment is lifelong The reason for anticoagulation is  mechanical heart valve.  Currently taking 7 MG daily, adjusted to this on 02/06/20.  Present Coumadin dose: Taking 7 MG daily Goal: 2.5-3.5  Excessive bruising: no Nose bleeding: no Rectal bleeding: no Prolonged menstrual cycles: N/A Eating diet with consistent amounts of foods containing Vitamin K:yes Any recent antibiotic use? no  Relevant past medical, surgical, family and social history reviewed and updated as indicated. Interim medical history since our last visit reviewed. Allergies and medications reviewed and updated.  ROS: Per HPI unless specifically indicated above     Objective:    BP 130/82    Pulse 78    Temp 97.8 F (36.6 C) (Oral)    Wt 256 lb (116.1 kg)    SpO2 98%    BMI 38.92 kg/m   Wt Readings from Last 3 Encounters:  02/19/20 256 lb (116.1 kg)  02/06/20 256 lb (116.1 kg)  01/30/20 253 lb (114.8 kg)     General: Well appearing, well nourished in no distress.  Normal mood and affect. Skin: No excessive bruising or rash  Last INR: 2.1 Last PT: 25.2    Last CBC:  Lab Results  Component Value Date   WBC 9.6 12/30/2019   HGB 14.3 12/30/2019   HCT 44.0 12/30/2019   MCV 88 12/30/2019   PLT 273 12/30/2019    Results for orders placed or performed in visit on 02/06/20  CoaguChek XS/INR Waived  Result Value Ref Range   INR 2.1 (H) 0.9 - 1.1   Prothrombin Time 25.2 sec       Assessment:     ICD-10-CM   1. S/P aortic valve replacement with metallic valve  Z95.4 CoaguChek XS/INR Waived    Plan:   Discussed current plan face-to-face with patient.  For coumadin dosing, elected to change dose to start taking 7 MG on Monday, Wednesday, Friday, Saturday, Sunday and 7.5 MG on Tuesday and Thursday.  Return in one week.. Will plan to recheck INR on Wednesday.

## 2020-02-19 NOTE — Assessment & Plan Note (Signed)
Chronic, ongoing with Coumadin use.  INR today 1.6.  Adjust Coumadin start taking 7 MG on Monday, Wednesday, Friday, Saturday, Sunday and 7.5 MG on Tuesday and Thursday.  Return in one week.  Recent Covid infection.  Return sooner to office if any bleeding or concerns.

## 2020-02-27 ENCOUNTER — Ambulatory Visit: Payer: 59 | Admitting: Family Medicine

## 2020-03-10 ENCOUNTER — Encounter: Payer: Self-pay | Admitting: Nurse Practitioner

## 2020-03-10 ENCOUNTER — Other Ambulatory Visit: Payer: Self-pay

## 2020-03-10 ENCOUNTER — Ambulatory Visit (INDEPENDENT_AMBULATORY_CARE_PROVIDER_SITE_OTHER): Payer: 59 | Admitting: Nurse Practitioner

## 2020-03-10 VITALS — BP 105/68 | HR 82 | Temp 98.4°F | Ht 68.0 in | Wt 266.2 lb

## 2020-03-10 DIAGNOSIS — E1159 Type 2 diabetes mellitus with other circulatory complications: Secondary | ICD-10-CM | POA: Diagnosis not present

## 2020-03-10 DIAGNOSIS — Z8673 Personal history of transient ischemic attack (TIA), and cerebral infarction without residual deficits: Secondary | ICD-10-CM

## 2020-03-10 DIAGNOSIS — Z794 Long term (current) use of insulin: Secondary | ICD-10-CM

## 2020-03-10 DIAGNOSIS — I152 Hypertension secondary to endocrine disorders: Secondary | ICD-10-CM

## 2020-03-10 DIAGNOSIS — I482 Chronic atrial fibrillation, unspecified: Secondary | ICD-10-CM

## 2020-03-10 DIAGNOSIS — R809 Proteinuria, unspecified: Secondary | ICD-10-CM

## 2020-03-10 DIAGNOSIS — Z6841 Body Mass Index (BMI) 40.0 and over, adult: Secondary | ICD-10-CM

## 2020-03-10 DIAGNOSIS — E1169 Type 2 diabetes mellitus with other specified complication: Secondary | ICD-10-CM | POA: Diagnosis not present

## 2020-03-10 DIAGNOSIS — Z954 Presence of other heart-valve replacement: Secondary | ICD-10-CM

## 2020-03-10 DIAGNOSIS — E1129 Type 2 diabetes mellitus with other diabetic kidney complication: Secondary | ICD-10-CM | POA: Diagnosis not present

## 2020-03-10 DIAGNOSIS — E785 Hyperlipidemia, unspecified: Secondary | ICD-10-CM

## 2020-03-10 DIAGNOSIS — D6869 Other thrombophilia: Secondary | ICD-10-CM

## 2020-03-10 LAB — COAGUCHEK XS/INR WAIVED
INR: 3.3 — ABNORMAL HIGH (ref 0.9–1.1)
Prothrombin Time: 39.2 s

## 2020-03-10 LAB — BAYER DCA HB A1C WAIVED: HB A1C (BAYER DCA - WAIVED): 9.1 % — ABNORMAL HIGH (ref <7.0)

## 2020-03-10 MED ORDER — OZEMPIC (0.25 OR 0.5 MG/DOSE) 2 MG/1.5ML ~~LOC~~ SOPN: 0.5000 mg | PEN_INJECTOR | SUBCUTANEOUS | 6 refills | Status: DC

## 2020-03-10 NOTE — Assessment & Plan Note (Signed)
Chronic, ongoing.  Continue current medication regimen and adjust as needed. Lipid panel today. 

## 2020-03-10 NOTE — Assessment & Plan Note (Signed)
Chronic, ongoing. Continue Carvedilol for rate control and Coumadin, refer to s/p valve plan for Coumadin dosing. Continue to collaborate with cardiology, recent note reviewed.  Continue to work with CCM team.   

## 2020-03-10 NOTE — Patient Instructions (Signed)

## 2020-03-10 NOTE — Assessment & Plan Note (Signed)
Recommend focus on healthy diet choices and regular activity. 

## 2020-03-10 NOTE — Assessment & Plan Note (Signed)
Monitor CBC with use of daily Coumadin for mechanical valve and a-fib.  Denies any current bleeding episodes.  Check CBC next visit. 

## 2020-03-10 NOTE — Assessment & Plan Note (Signed)
Chronic, ongoing with BP at goal today.  Educated him on diabetes effect on vascular system.  Continue Benazepril, HCTZ, Coreg, and Fenofibrate/Atorvastatin.  Recommend he monitor BP at home at least a few mornings a week + focus on DASH diet.  Discussed with him stroke prevention goal for BP <130/90.  BMP and TSH today.  Return in 3 months.

## 2020-03-10 NOTE — Assessment & Plan Note (Signed)
In August 2021 -- continue collaboration with neurology and cardiology.  Goals discussed with him of A1C <6.5%, LDL <70, and BP <130/90.  Have achieved BP goal, but need to achieve other goals.

## 2020-03-10 NOTE — Assessment & Plan Note (Signed)
Chronic, ongoing.  Refer to diabetes plan of care for further. 

## 2020-03-10 NOTE — Assessment & Plan Note (Addendum)
Chronic, ongoing with A1C 9.1% today, continues above goal.  Continue Metformin,  Farxiga, Tresiba, Ozempic, and Humalog. Ozempic refills sent in.  Continue Benazepril for kidney protection.  Focus heavily on diabetic diet and modest weight loss, decreasing carb and sugar intake at home.  Recommend he check BS TID -- continue FreeStyle (bring monitor in for this).  Will continue to work with CCM team to ensure adherence to medication regimen, which is a major issue.  New referral to endocrinology placed -- highly recommended he attend a visit with them.  Discussed at length his A1C goal for stroke prevention being 6.5% or less.  Return in 3 months to office.

## 2020-03-10 NOTE — Assessment & Plan Note (Signed)
BMI 40.48.  Recommended eating smaller high protein, low fat meals more frequently and exercising 30 mins a day 5 times a week with a goal of 10-15lb weight loss in the next 3 months. Patient voiced their understanding and motivation to adhere to these recommendations.

## 2020-03-10 NOTE — Progress Notes (Signed)
BP 105/68   Pulse 82   Temp 98.4 F (36.9 C) (Oral)   Ht 5\' 8"  (1.727 m)   Wt 266 lb 3.2 oz (120.7 kg)   SpO2 94%   BMI 40.48 kg/m    Subjective:    Patient ID: [name], male    DOB: February 08, 1961, 59 y.o.   MRN: [id number]  HPI: Rodney Hedeen. is a 59 y.o. male  Chief Complaint  Patient presents with  . Coumidin therapy   DIABETES Last A1C9.6% in August, has referrals in place to see endocrinology (placed by PCP and neurology), but has not attended yet -- was scheduled at one point but got Covid. Continues on Tresiba, Ozempic (not taking -- reports they did not fill it), Humalog, Farxiga, Metformin 1000 MG BID. He does endorse being an "eater" and not following diabetic diet. Hypoglycemic episodes:no Polydipsia/polyuria:no Visual disturbance:no Chest pain:no Paresthesias:no Glucose Monitoring:yes Accucheck frequency:using Freestyle, but did not bring data  Fasting glucose:170 yesterday morning Post prandial: Evening:  Before meals: Taking Insulin?:yes Long acting insulin: Tresiba50 units Short acting insulin:30units with meals Blood Pressure Monitoring:not checking Retinal Examination:Not up to Date Foot Exam:Up to Date Pneumovax:Up to Date Influenza:refused Aspirin:no  ATRIAL FIBRILLATIONWITH METALLIC VALVE WITH HTN/HLD Currently takes Coumadin8 mg once a day with INR 2.9 on 02/26/20. Saw cardiology last 02/21/20 -- home sleep test ordered.He is to continue Benazepril and HCTZ, Carvedilol + echo ordered by cardiology.  Continues on Atorvastatin and Fenofibrate for HLD.  Last saw neurology on 02/18/20 for history of recent stroke.  Reports cardiology is going to work on getting INR meter at home for him.  Denies recent headaches or dizziness, no SOB or CP.   Atrial fibrillation status:stable Satisfied with current treatment:yes Medication  side effects:no Medication compliance:good compliance Etiology of atrial fibrillation:  Palpitations:no Chest pain:no Dyspnea on exertion:no Orthopnea:no Syncope:no Edema:no Ventricular rate control:B blocker Anti-coagulation:warfarin  Relevant past medical, surgical, family and social history reviewed and updated as indicated. Interim medical history since our last visit reviewed. Allergies and medications reviewed and updated.  Review of Systems  Constitutional: Negative for activity change, diaphoresis, fatigue and fever.  Respiratory: Negative for cough, chest tightness, shortness of breath and wheezing.   Cardiovascular: Negative for chest pain, palpitations and leg swelling.  Gastrointestinal: Negative.   Endocrine: Negative for cold intolerance, heat intolerance, polydipsia, polyphagia and polyuria.  Neurological: Negative.   Psychiatric/Behavioral: Negative.     Per HPI unless specifically indicated above     Objective:    BP 105/68   Pulse 82   Temp 98.4 F (36.9 C) (Oral)   Ht 5\' 8"  (1.727 m)   Wt 266 lb 3.2 oz (120.7 kg)   SpO2 94%   BMI 40.48 kg/m   Wt Readings from Last 3 Encounters:  03/10/20 266 lb 3.2 oz (120.7 kg)  02/19/20 256 lb (116.1 kg)  02/06/20 256 lb (116.1 kg)    Physical Exam Vitals and nursing note reviewed.  Constitutional:      General: He is awake. He is not in acute distress.    Appearance: He is well-developed and well-groomed. He is morbidly obese. He is not ill-appearing.  HENT:     Head: Normocephalic and atraumatic.     Right Ear: Hearing normal. No drainage.     Left Ear: Hearing normal. No drainage.  Eyes:     General: Lids are normal.        Right eye: No discharge.  Left eye: No discharge.     Conjunctiva/sclera: Conjunctivae normal.     Pupils: Pupils are equal, round, and reactive to light.  Neck:     Thyroid: No thyromegaly.     Vascular: No carotid bruit.     Trachea: Trachea normal.   Cardiovascular:     Rate and Rhythm: Normal rate and regular rhythm.     Heart sounds: Normal heart sounds, S1 normal and S2 normal. No murmur heard.  No gallop.   Pulmonary:     Effort: Pulmonary effort is normal. No accessory muscle usage or respiratory distress.     Breath sounds: Normal breath sounds.  Abdominal:     General: Bowel sounds are normal.     Palpations: Abdomen is soft.  Musculoskeletal:        General: Normal range of motion.     Cervical back: Normal range of motion and neck supple.     Right lower leg: No edema.     Left lower leg: No edema.  Skin:    General: Skin is warm and dry.     Capillary Refill: Capillary refill takes less than 2 seconds.     Findings: No rash.  Neurological:     Mental Status: He is alert and oriented to person, place, and time.     Deep Tendon Reflexes: Reflexes are normal and symmetric.  Psychiatric:        Attention and Perception: Attention normal.        Mood and Affect: Mood normal.        Speech: Speech normal.        Behavior: Behavior normal. Behavior is cooperative.        Thought Content: Thought content normal.    Results for orders placed or performed in visit on 02/19/20  CoaguChek XS/INR Waived  Result Value Ref Range   INR 1.6 (H) 0.9 - 1.1   Prothrombin Time 19.1 sec      Assessment & Plan:   Problem List Items Addressed This Visit      Cardiovascular and Mediastinum   Chronic atrial fibrillation (HCC)    Chronic, ongoing. Continue Carvedilol for rate control and Coumadin, refer to s/p valve plan for Coumadin dosing. Continue to collaborate with cardiology, recent note reviewed.  Continue to work with CCM team.        Hypertension associated with type 2 diabetes mellitus (HCC)    Chronic, ongoing with BP at goal today.  Educated him on diabetes effect on vascular system.  Continue Benazepril, HCTZ, Coreg, and Fenofibrate/Atorvastatin.  Recommend he monitor BP at home at least a few mornings a week + focus  on DASH diet.  Discussed with him stroke prevention goal for BP <130/90.  BMP and TSH today.  Return in 3 months.      Relevant Medications   Semaglutide,0.25 or 0.5MG /DOS, (OZEMPIC, 0.25 OR 0.5 MG/DOSE,) 2 MG/1.5ML SOPN   Other Relevant Orders   Bayer DCA Hb A1c Waived   Basic metabolic panel   TSH     Endocrine   Type 2 diabetes mellitus with proteinuria (HCC) - Primary    Chronic, ongoing with A1C 9.1% today, continues above goal.  Continue Metformin,  Farxiga, Tresiba, Ozempic, and Humalog. Ozempic refills sent in.  Continue Benazepril for kidney protection.  Focus heavily on diabetic diet and modest weight loss, decreasing carb and sugar intake at home.  Recommend he check BS TID -- continue FreeStyle (bring monitor in for this).  Will continue  to work with CCM team to ensure adherence to medication regimen, which is a major issue.  New referral to endocrinology placed -- highly recommended he attend a visit with them.  Discussed at length his A1C goal for stroke prevention being 6.5% or less.  Return in 3 months to office.        Relevant Medications   Semaglutide,0.25 or 0.5MG /DOS, (OZEMPIC, 0.25 OR 0.5 MG/DOSE,) 2 MG/1.5ML SOPN   Other Relevant Orders   Bayer DCA Hb A1c Waived   Ambulatory referral to Endocrinology   Hyperlipidemia associated with type 2 diabetes mellitus (HCC)    Chronic, ongoing.  Continue current medication regimen and adjust as needed.  Lipid panel today.      Relevant Medications   Semaglutide,0.25 or 0.5MG /DOS, (OZEMPIC, 0.25 OR 0.5 MG/DOSE,) 2 MG/1.5ML SOPN   Other Relevant Orders   Bayer DCA Hb A1c Waived   Lipid Panel w/o Chol/HDL Ratio     Hematopoietic and Hemostatic   Acquired thrombophilia (HCC)    Monitor CBC with use of daily Coumadin for mechanical valve and a-fib.  Denies any current bleeding episodes.  Check CBC next visit.        Other   S/P aortic valve replacement with metallic valve    Chronic, ongoing with Coumadin use.  INR today  3.3.  Continue Coumadin 8 MG daily.  Return in four weeks.  Recent Covid infection.  Return sooner to office if any bleeding or concerns.       Relevant Orders   CoaguChek XS/INR Waived   Morbid obesity (HCC)    BMI 40.48.  Recommended eating smaller high protein, low fat meals more frequently and exercising 30 mins a day 5 times a week with a goal of 10-15lb weight loss in the next 3 months. Patient voiced their understanding and motivation to adhere to these recommendations.       Relevant Medications   Semaglutide,0.25 or 0.5MG /DOS, (OZEMPIC, 0.25 OR 0.5 MG/DOSE,) 2 MG/1.5ML SOPN   BMI 40.0-44.9, adult (HCC)    Recommend focus on healthy diet choices and regular activity.      Relevant Medications   Semaglutide,0.25 or 0.5MG /DOS, (OZEMPIC, 0.25 OR 0.5 MG/DOSE,) 2 MG/1.5ML SOPN   Long-term insulin use (HCC)    Chronic, ongoing.  Refer to diabetes plan of care for further.      History of stroke    In August 2021 -- continue collaboration with neurology and cardiology.  Goals discussed with him of A1C <6.5%, LDL <70, and BP <130/90.  Have achieved BP goal, but need to achieve other goals.          Follow up plan: Return in about 4 weeks (around 04/07/2020) for INR check.

## 2020-03-10 NOTE — Assessment & Plan Note (Signed)
Chronic, ongoing with Coumadin use.  INR today 3.3.  Continue Coumadin 8 MG daily.  Return in four weeks.  Recent Covid infection.  Return sooner to office if any bleeding or concerns.

## 2020-03-11 ENCOUNTER — Encounter: Payer: Self-pay | Admitting: Nurse Practitioner

## 2020-03-11 DIAGNOSIS — E1122 Type 2 diabetes mellitus with diabetic chronic kidney disease: Secondary | ICD-10-CM | POA: Insufficient documentation

## 2020-03-11 LAB — LIPID PANEL W/O CHOL/HDL RATIO
Cholesterol, Total: 162 mg/dL (ref 100–199)
HDL: 25 mg/dL — ABNORMAL LOW (ref >39)
LDL Chol Calc (NIH): 66 mg/dL (ref 0–99)
Triglycerides: 460 mg/dL — ABNORMAL HIGH (ref 0–149)
VLDL Cholesterol Cal: 71 mg/dL — ABNORMAL HIGH (ref 5–40)

## 2020-03-11 LAB — BASIC METABOLIC PANEL
BUN/Creatinine Ratio: 28 — ABNORMAL HIGH (ref 9–20)
BUN: 38 mg/dL — ABNORMAL HIGH (ref 6–24)
CO2: 23 mmol/L (ref 20–29)
Calcium: 9.4 mg/dL (ref 8.7–10.2)
Chloride: 97 mmol/L (ref 96–106)
Creatinine, Ser: 1.36 mg/dL — ABNORMAL HIGH (ref 0.76–1.27)
GFR calc Af Amer: 65 mL/min/{1.73_m2} (ref >59)
GFR calc non Af Amer: 57 mL/min/{1.73_m2} — ABNORMAL LOW (ref >59)
Glucose: 255 mg/dL — ABNORMAL HIGH (ref 65–99)
Potassium: 4.1 mmol/L (ref 3.5–5.2)
Sodium: 136 mmol/L (ref 134–144)

## 2020-03-11 LAB — TSH: TSH: 1.6 u[IU]/mL (ref 0.450–4.500)

## 2020-03-11 NOTE — Progress Notes (Signed)
Contacted via MyChart  Good morning Rodney Dixon.  Your labs have returned.  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  We will continue to monitor closely and maintain your Benazepril for kidney protection.  If any decline in future we may send to kidney doctor.  For not fasting your cholesterol labs do not look terrible, I would like to recheck fasting at next visit.  Continue medications as ordered.  Thyroid is normal.  Any questions? Keep being awesome!!  Thank you for allowing me to participate in your care. Kindest regards, Hilberto Burzynski

## 2020-04-05 ENCOUNTER — Encounter: Payer: Self-pay | Admitting: Nurse Practitioner

## 2020-04-05 DIAGNOSIS — N183 Chronic kidney disease, stage 3 unspecified: Secondary | ICD-10-CM | POA: Insufficient documentation

## 2020-04-07 ENCOUNTER — Encounter: Payer: Self-pay | Admitting: Nurse Practitioner

## 2020-04-07 ENCOUNTER — Other Ambulatory Visit: Payer: Self-pay

## 2020-04-07 ENCOUNTER — Ambulatory Visit (INDEPENDENT_AMBULATORY_CARE_PROVIDER_SITE_OTHER): Payer: 59 | Admitting: Nurse Practitioner

## 2020-04-07 VITALS — BP 137/90 | HR 80 | Temp 97.9°F | Wt 261.4 lb

## 2020-04-07 DIAGNOSIS — Z954 Presence of other heart-valve replacement: Secondary | ICD-10-CM

## 2020-04-07 LAB — COAGUCHEK XS/INR WAIVED
INR: 2.2 — ABNORMAL HIGH (ref 0.9–1.1)
Prothrombin Time: 26.6 s

## 2020-04-07 NOTE — Assessment & Plan Note (Signed)
Chronic, ongoing with Coumadin use.  INR today 2.2, downward trend.  Continue Coumadin 8 MG daily, discussed with patient and this is his preference at this time.  Return in one week for INR check and if lower then will increase dose slightly.  Recent Covid infection.  Return sooner to office if any bleeding or concerns.

## 2020-04-07 NOTE — Progress Notes (Signed)
BP 137/90    Pulse 80    Temp 97.9 F (36.6 C)    Wt 261 lb 6.4 oz (118.6 kg)    SpO2 95%    BMI 39.75 kg/m    Subjective:    Patient ID: Rodney Chary [name], male    DOB: [birth year], 59 y.o.   MRN: [id number]  CC: Coumadin management  HPI: This patient is a 59 y.o. male who presents for coumadin management. The expected duration of coumadin treatment is lifelong The reason for anticoagulation is  mechanical heart valve.  Currently taking 8 MG daily.  Present Coumadin dose: Taking 8 MG daily Goal: 2.5-3.5  Excessive bruising: no Nose bleeding: no Rectal bleeding: no Prolonged menstrual cycles: N/A Eating diet with consistent amounts of foods containing Vitamin K:yes Any recent antibiotic use? no  Relevant past medical, surgical, family and social history reviewed and updated as indicated. Interim medical history since our last visit reviewed. Allergies and medications reviewed and updated.  ROS: Per HPI unless specifically indicated above     Objective:    BP 137/90    Pulse 80    Temp 97.9 F (36.6 C)    Wt 261 lb 6.4 oz (118.6 kg)    SpO2 95%    BMI 39.75 kg/m   Wt Readings from Last 3 Encounters:  04/07/20 261 lb 6.4 oz (118.6 kg)  03/10/20 266 lb 3.2 oz (120.7 kg)  02/19/20 256 lb (116.1 kg)     General: Well appearing, well nourished in no distress.  Normal mood and affect. Skin: No excessive bruising or rash  Last INR: 3.3 Last PT: 39.2    Last CBC:  Lab Results  Component Value Date   WBC 9.6 12/30/2019   HGB 14.3 12/30/2019   HCT 44.0 12/30/2019   MCV 88 12/30/2019   PLT 273 12/30/2019    Results for orders placed or performed in visit on 03/10/20  Bayer DCA Hb A1c Waived  Result Value Ref Range   HB A1C (BAYER DCA - WAIVED) 9.1 (H) <7.0 %  Basic metabolic panel  Result Value Ref Range   Glucose 255 (H) 65 - 99 mg/dL   BUN 38 (H) 6 - 24 mg/dL   Creatinine, Ser 2.44 (H) 0.76 - 1.27 mg/dL   GFR calc non Af Amer 57 (L) >59 mL/min/1.73   GFR calc  Af Amer 65 >59 mL/min/1.73   BUN/Creatinine Ratio 28 (H) 9 - 20   Sodium 136 134 - 144 mmol/L   Potassium 4.1 3.5 - 5.2 mmol/L   Chloride 97 96 - 106 mmol/L   CO2 23 20 - 29 mmol/L   Calcium 9.4 8.7 - 10.2 mg/dL  Lipid Panel w/o Chol/HDL Ratio  Result Value Ref Range   Cholesterol, Total 162 100 - 199 mg/dL   Triglycerides 010 (H) 0 - 149 mg/dL   HDL 25 (L) >27 mg/dL   VLDL Cholesterol Cal 71 (H) 5 - 40 mg/dL   LDL Chol Calc (NIH) 66 0 - 99 mg/dL  CoaguChek XS/INR Waived  Result Value Ref Range   INR 3.3 (H) 0.9 - 1.1   Prothrombin Time 39.2 sec  TSH  Result Value Ref Range   TSH 1.600 0.450 - 4.500 uIU/mL       Assessment:     ICD-10-CM   1. S/P aortic valve replacement with metallic valve  Z95.4 CoaguChek XS/INR Waived    Plan:   Discussed current plan face-to-face with patient. For coumadin dosing,  elected to keep dose at 8 MG.  Return in one week.. Will plan to recheck INR in one week and if lower then increase dose.

## 2020-04-07 NOTE — Patient Instructions (Addendum)
Rodney Ben, NP  [address]  [place], [place] [address]  [phone] (Work)  NEED FOLLOW-UP IN January.    Bleeding Precautions When on Anticoagulant Therapy, Adult Anticoagulant therapy, also called blood thinner therapy, is medicine that helps to prevent and treat blood clots. The medicine works by stopping blood clots from forming or growing. Blood clots that form in your blood vessels can be dangerous. They can break loose and travel to the heart, lungs, or brain. This increases the risk of a heart attack, stroke, or blocked lung artery (pulmonary embolism). Anticoagulants also increase the risk of bleeding. Try to protect yourself from cuts and other injuries that can cause bleeding. It is important to take anticoagulants exactly as told by your health care provider. Why do I need to be on anticoagulant therapy? You may need this medicine if you are at risk of developing a blood clot. Conditions that increase your risk of a blood clot include:  Being born with heart disease or a heart malformation (congenital heart disease).  Developing heart disease.  Having had surgery, such as valve replacement.  Having had a serious accident or other type of severe injury (trauma).  Having certain types of cancer.  Having certain diseases that can increase blood clotting.  Having a high risk of stroke or heart attack.  Having atrial fibrillation (AF). What are the common anticoagulant medicines? There are several types of anticoagulant medicines. The most common types are:  Medicines that you take by mouth (oral medicines), such as: ? Warfarin. ? Novel oral anticoagulants (NOACs), such as:  Direct thrombin inhibitors (dabigatran).  Factor Xa inhibitors (apixaban, edoxaban, and rivaroxaban).  Injections, such as: ? Unfractionated heparin. ? Low molecular weight heparin. These anticoagulants work in different ways to prevent blood clots. They also have different  risks and side effects. What do I need to remember while on anticoagulant therapy? Taking anticoagulants  Take your medicine at the same time every day. If you forget to take your medicine, take it as soon as you remember. Do not double your dosage of medicine if you miss a whole day. Take your normal dose and call your health care provider.  Do not stop taking your medicine unless your health care provider approves. Stopping the medicine can increase your risk of developing a blood clot. Taking other medicines  Take over-the-counter and prescriptions medicines only as told by your health care provider.  Do not take over-the-counter NSAIDs, including aspirin and ibuprofen, while you are on anticoagulant therapy. These medicines increase your risk of dangerous bleeding.  Get approval from your health care provider before you start taking any new medicines, vitamins, or herbal products. Some of these could interfere with your therapy. General instructions  Keep all follow-up visits as told by your health care provider. This is important.  If you are pregnant or trying to get pregnant, talk with a health care provider about anticoagulants. Some of these medicines are not safe to take during pregnancy.  Tell all health care providers, including your dentist, that you are on anticoagulant therapy. It is especially important to tell providers before you have any surgery, medical procedures, or dental work done. What precautions should I take?   Be very careful when using knives, scissors, or other sharp objects.  Use an electric razor instead of a blade.  Do not use toothpicks.  Use a soft-bristled toothbrush. Brush your teeth gently.  Always wear shoes outdoors and wear slippers indoors.  Be careful when  cutting your fingernails and toenails.  Place bath mats in the bathroom. If possible, install handrails as well.  Wear gloves while you do yard work.  Wear your seat  belt.  Prevent falls by removing loose rugs and extension cords from areas where you walk. Use a cane or walker if you need it.  Avoid constipation by: ? Drinking enough fluid to keep your urine clear or pale yellow. ? Eating foods that are high in fiber, such as fresh fruits and vegetables, whole grains, and beans. ? Limiting foods that are high in fat and processed sugars, such as fried and sweet foods.  Do not play contact sports or participate in other activities that have a high risk for injury. What other precautions are important if on warfarin therapy? If you are taking a type of anticoagulant called warfarin, make sure you:  Work with a diet and nutrition specialist (dietitian) to make an eating plan. Do not make any sudden changes to your diet after you have started your eating plan.  Do not drink alcohol. It can interfere with your medicine and increase your risk of an injury that causes bleeding.  Get regular blood tests as told by your health care provider. What are some questions to ask my health care provider?  Why do I need anticoagulant therapy?  What is the best anticoagulant therapy for my condition?  How long will I need anticoagulant therapy?  What are the side effects of anticoagulant therapy?  When should I take my medicine? What should I do if I forget to take it?  Will I need to have regular blood tests?  Do I need to change my diet? Are there foods or drinks that I should avoid?  What activities are safe for me?  What should I do if I want to get pregnant? Contact a health care provider if:  You miss a dose of medicine: ? And you are not sure what to do. ? For more than one day.  You have: ? Menstrual bleeding that is heavier than normal. ? Bloody or brown urine. ? Easy bruising. ? Black and tarry stool or bright red stool. ? Side effects from your medicine.  You feel weak or dizzy.  You become pregnant. Get help right away if:  You  have bleeding that will not stop within 20 minutes from: ? The nose. ? The gums. ? A cut on the skin.  You have a severe headache or stomachache.  You vomit or cough up blood.  You fall or hit your head. Summary  Anticoagulant therapy, also called blood thinner therapy, is medicine that helps to prevent and treat blood clots.  Anticoagulants work in different ways to prevent blood clots. They also have different risks and side effects.  Talk with your health care provider about any precautions that you should take while on anticoagulant therapy. This information is not intended to replace advice given to you by your health care provider. Make sure you discuss any questions you have with your health care provider. Document Revised: 08/15/2018 Document Reviewed: 07/12/2016 Elsevier Patient Education  2020 [employer].

## 2020-04-15 ENCOUNTER — Ambulatory Visit: Payer: 59 | Admitting: Nurse Practitioner

## 2020-04-24 ENCOUNTER — Encounter: Payer: Self-pay | Admitting: Unknown Physician Specialty

## 2020-04-24 ENCOUNTER — Ambulatory Visit (INDEPENDENT_AMBULATORY_CARE_PROVIDER_SITE_OTHER): Payer: 59 | Admitting: Unknown Physician Specialty

## 2020-04-24 ENCOUNTER — Other Ambulatory Visit: Payer: Self-pay

## 2020-04-24 VITALS — BP 134/74 | HR 78 | Temp 98.1°F | Wt 270.6 lb

## 2020-04-24 DIAGNOSIS — Z954 Presence of other heart-valve replacement: Secondary | ICD-10-CM

## 2020-04-24 LAB — COAGUCHEK XS/INR WAIVED
INR: 3.2 — ABNORMAL HIGH (ref 0.9–1.1)
Prothrombin Time: 38 s

## 2020-04-24 NOTE — Progress Notes (Signed)
° °  BP 134/74    Pulse 78    Temp 98.1 F (36.7 C)    Wt 270 lb 9.6 oz (122.7 kg)    SpO2 97%    BMI 41.14 kg/m    Subjective:    Patient ID: Rodney Chary [name], male    DOB: Dec 20, 1960, 59 y.o.   MRN: [id number]  HPI: Rodney Dixon. is a 59 y.o. male  Chief Complaint  Patient presents with   INR levels   Pt is here to recheck INR taking for a heart valve.  Goal 2.5-3.5 Taking 8 mg/day States had Covid which took him down for about 2 weeks at the end of August.   No bruising, blood in urine.   Diet without green leafy vegetables.    Last seen for diabetes 11/2 and not to goal.  Having trouble getting in to see endocrinologist  Relevant past medical, surgical, family and social history reviewed and updated as indicated. Interim medical history since our last visit reviewed. Allergies and medications reviewed and updated.  Review of Systems  Per HPI unless specifically indicated above     Objective:    BP 134/74    Pulse 78    Temp 98.1 F (36.7 C)    Wt 270 lb 9.6 oz (122.7 kg)    SpO2 97%    BMI 41.14 kg/m   Wt Readings from Last 3 Encounters:  04/24/20 270 lb 9.6 oz (122.7 kg)  04/07/20 261 lb 6.4 oz (118.6 kg)  03/10/20 266 lb 3.2 oz (120.7 kg)    Physical Exam Constitutional:      General: He is not in acute distress.    Appearance: Normal appearance. He is well-developed and well-nourished.  HENT:     Head: Normocephalic and atraumatic.  Eyes:     General: Lids are normal. No scleral icterus.       Right eye: No discharge.        Left eye: No discharge.     Conjunctiva/sclera: Conjunctivae normal.  Pulmonary:     Effort: Pulmonary effort is normal.  Abdominal:     Palpations: There is no hepatomegaly or splenomegaly.  Musculoskeletal:        General: Normal range of motion.  Skin:    General: Skin is intact.     Coloration: Skin is not pale.     Findings: No rash.  Neurological:     Mental Status: He is alert and oriented to person, place, and  time.  Psychiatric:        Mood and Affect: Mood and affect normal.        Behavior: Behavior normal.        Thought Content: Thought content normal.        Judgment: Judgment normal.     Results for orders placed or performed in visit on 04/07/20  CoaguChek XS/INR Waived  Result Value Ref Range   INR 2.2 (H) 0.9 - 1.1   Prothrombin Time 26.6 sec      Assessment & Plan:   Problem List Items Addressed This Visit      Unprioritized   S/P aortic valve replacement with metallic valve - Primary   Relevant Orders   CoaguChek XS/INR Waived (STAT)       Follow up plan: Return in about 1 month (around 05/25/2020).  Diabetes check in February

## 2020-05-26 ENCOUNTER — Ambulatory Visit: Payer: 59 | Admitting: Nurse Practitioner

## 2020-05-30 ENCOUNTER — Other Ambulatory Visit: Payer: Self-pay | Admitting: Nurse Practitioner

## 2020-06-17 ENCOUNTER — Ambulatory Visit (INDEPENDENT_AMBULATORY_CARE_PROVIDER_SITE_OTHER): Payer: 59 | Admitting: Nurse Practitioner

## 2020-06-17 ENCOUNTER — Encounter: Payer: Self-pay | Admitting: Nurse Practitioner

## 2020-06-17 ENCOUNTER — Other Ambulatory Visit: Payer: Self-pay

## 2020-06-17 VITALS — BP 138/84 | HR 81 | Temp 98.0°F | Wt 259.8 lb

## 2020-06-17 DIAGNOSIS — Z6841 Body Mass Index (BMI) 40.0 and over, adult: Secondary | ICD-10-CM

## 2020-06-17 DIAGNOSIS — E785 Hyperlipidemia, unspecified: Secondary | ICD-10-CM

## 2020-06-17 DIAGNOSIS — D6869 Other thrombophilia: Secondary | ICD-10-CM

## 2020-06-17 DIAGNOSIS — N1831 Chronic kidney disease, stage 3a: Secondary | ICD-10-CM

## 2020-06-17 DIAGNOSIS — E1169 Type 2 diabetes mellitus with other specified complication: Secondary | ICD-10-CM

## 2020-06-17 DIAGNOSIS — E1122 Type 2 diabetes mellitus with diabetic chronic kidney disease: Secondary | ICD-10-CM | POA: Diagnosis not present

## 2020-06-17 DIAGNOSIS — Z91148 Patient's other noncompliance with medication regimen for other reason: Secondary | ICD-10-CM

## 2020-06-17 DIAGNOSIS — Z794 Long term (current) use of insulin: Secondary | ICD-10-CM

## 2020-06-17 DIAGNOSIS — E1159 Type 2 diabetes mellitus with other circulatory complications: Secondary | ICD-10-CM | POA: Diagnosis not present

## 2020-06-17 DIAGNOSIS — R809 Proteinuria, unspecified: Secondary | ICD-10-CM

## 2020-06-17 DIAGNOSIS — I482 Chronic atrial fibrillation, unspecified: Secondary | ICD-10-CM

## 2020-06-17 DIAGNOSIS — I152 Hypertension secondary to endocrine disorders: Secondary | ICD-10-CM

## 2020-06-17 DIAGNOSIS — Z9114 Patient's other noncompliance with medication regimen: Secondary | ICD-10-CM

## 2020-06-17 DIAGNOSIS — E1129 Type 2 diabetes mellitus with other diabetic kidney complication: Secondary | ICD-10-CM

## 2020-06-17 DIAGNOSIS — Z954 Presence of other heart-valve replacement: Secondary | ICD-10-CM

## 2020-06-17 LAB — BAYER DCA HB A1C WAIVED: HB A1C (BAYER DCA - WAIVED): >14 % — ABNORMAL HIGH (ref <7.0)

## 2020-06-17 LAB — COAGUCHEK XS/INR WAIVED
INR: 2.8 — ABNORMAL HIGH (ref 0.9–1.1)
Prothrombin Time: 34 s

## 2020-06-17 LAB — MICROALBUMIN, URINE WAIVED
Creatinine, Urine Waived: 50 mg/dL (ref 10–300)
Microalb, Ur Waived: 30 mg/L — ABNORMAL HIGH (ref 0–19)

## 2020-06-17 NOTE — Assessment & Plan Note (Addendum)
Chronic, ongoing with A1C >14% today, continues above goal and have discussed this at length with him.  Urine ALB today 30.  Continue Metformin,  Farxiga, Tresiba, Ozempic, and Humalog.  Increase Tresiba to 60 units at this time.  Continue Benazepril for kidney protection.  Focus heavily on diabetic diet and modest weight loss, decreasing carb and sugar intake at home.  Recommend he check BS 3-4 times a day and document for next visit for review.  Will continue to work with CCM team to ensure adherence to medication regimen and diet, which is a major issue.  New referral to endocrinology placed -- highly recommended he attend a visit with them and not miss visits.  Discussed at length his A1C goal for stroke prevention being 6.5% or less.  Return in 4 weeks for follow-up.

## 2020-06-17 NOTE — Assessment & Plan Note (Signed)
Chronic, ongoing with A1C >14% today, continues above goal and have discussed this at length with him.  Continue Metformin,  Farxiga, Tresiba, Ozempic, and Humalog.  Increase Tresiba to 60 units at this time.  Continue Benazepril for kidney protection.  Focus heavily on diabetic diet and modest weight loss, decreasing carb and sugar intake at home.  Recommend he check BS 3-4 times a day and document for next visit for review.  Will continue to work with CCM team to ensure adherence to medication regimen and diet, which is a major issue.  New referral to endocrinology placed -- highly recommended he attend a visit with them and not miss visits.  Discussed at length his A1C goal for stroke prevention being 6.5% or less.  Return in 4 weeks for follow-up.

## 2020-06-17 NOTE — Patient Instructions (Signed)
Increase Tresiba to 60 units -- CHECK BLOOD SUGARS THREE TIMES A DAY AND DOCUMENT -- BRING TO VISIT.    Diabetes Mellitus and Nutrition, Adult When you have diabetes, or diabetes mellitus, it is very important to have healthy eating habits because your blood sugar (glucose) levels are greatly affected by what you eat and drink. Eating healthy foods in the right amounts, at about the same times every day, can help you:  Control your blood glucose.  Lower your risk of heart disease.  Improve your blood pressure.  Reach or maintain a healthy weight. What can affect my meal plan? Every person with diabetes is different, and each person has different needs for a meal plan. Your health care provider may recommend that you work with a dietitian to make a meal plan that is best for you. Your meal plan may vary depending on factors such as:  The calories you need.  The medicines you take.  Your weight.  Your blood glucose, blood pressure, and cholesterol levels.  Your activity level.  Other health conditions you have, such as heart or kidney disease. How do carbohydrates affect me? Carbohydrates, also called carbs, affect your blood glucose level more than any other type of food. Eating carbs naturally raises the amount of glucose in your blood. Carb counting is a method for keeping track of how many carbs you eat. Counting carbs is important to keep your blood glucose at a healthy level, especially if you use insulin or take certain oral diabetes medicines. It is important to know how many carbs you can safely have in each meal. This is different for every person. Your dietitian can help you calculate how many carbs you should have at each meal and for each snack. How does alcohol affect me? Alcohol can cause a sudden decrease in blood glucose (hypoglycemia), especially if you use insulin or take certain oral diabetes medicines. Hypoglycemia can be a life-threatening condition. Symptoms of  hypoglycemia, such as sleepiness, dizziness, and confusion, are similar to symptoms of having too much alcohol.  Do not drink alcohol if: ? Your health care provider tells you not to drink. ? You are pregnant, may be pregnant, or are planning to become pregnant.  If you drink alcohol: ? Do not drink on an empty stomach. ? Limit how much you use to:  0-1 drink a day for women.  0-2 drinks a day for men. ? Be aware of how much alcohol is in your drink. In the [place] one drink equals one 12 oz bottle of beer (355 mL), one 5 oz glass of wine (148 mL), or one 1 oz glass of hard liquor (44 mL). ? Keep yourself hydrated with water, diet soda, or unsweetened iced tea.  Keep in mind that regular soda, juice, and other mixers may contain a lot of sugar and must be counted as carbs. What are tips for following this plan? Reading food labels  Start by checking the serving size on the "Nutrition Facts" label of packaged foods and drinks. The amount of calories, carbs, fats, and other nutrients listed on the label is based on one serving of the item. Many items contain more than one serving per package.  Check the total grams (g) of carbs in one serving. You can calculate the number of servings of carbs in one serving by dividing the total carbs by 15. For example, if a food has 30 g of total carbs per serving, it would be equal to 2 servings  of carbs.  Check the number of grams (g) of saturated fats and trans fats in one serving. Choose foods that have a low amount or none of these fats.  Check the number of milligrams (mg) of salt (sodium) in one serving. Most people should limit total sodium intake to less than 2,300 mg per day.  Always check the nutrition information of foods labeled as "low-fat" or "nonfat." These foods may be higher in added sugar or refined carbs and should be avoided.  Talk to your dietitian to identify your daily goals for nutrients listed on the label. Shopping  Avoid  buying canned, pre-made, or processed foods. These foods tend to be high in fat, sodium, and added sugar.  Shop around the outside edge of the grocery store. This is where you will most often find fresh fruits and vegetables, bulk grains, fresh meats, and fresh dairy. Cooking  Use low-heat cooking methods, such as baking, instead of high-heat cooking methods like deep frying.  Cook using healthy oils, such as olive, canola, or sunflower oil.  Avoid cooking with butter, cream, or high-fat meats. Meal planning  Eat meals and snacks regularly, preferably at the same times every day. Avoid going long periods of time without eating.  Eat foods that are high in fiber, such as fresh fruits, vegetables, beans, and whole grains. Talk with your dietitian about how many servings of carbs you can eat at each meal.  Eat 4-6 oz (112-168 g) of lean protein each day, such as lean meat, chicken, fish, eggs, or tofu. One ounce (oz) of lean protein is equal to: ? 1 oz (28 g) of meat, chicken, or fish. ? 1 egg. ?  cup (62 g) of tofu.  Eat some foods each day that contain healthy fats, such as avocado, nuts, seeds, and fish.   What foods should I eat? Fruits Berries. Apples. Oranges. Peaches. Apricots. Plums. Grapes. Mango. Papaya. Pomegranate. Kiwi. Cherries. Vegetables Lettuce. Spinach. Leafy greens, including kale, chard, collard greens, and mustard greens. Beets. Cauliflower. Cabbage. Broccoli. Carrots. Green beans. Tomatoes. Peppers. Onions. Cucumbers. Brussels sprouts. Grains Whole grains, such as whole-wheat or whole-grain bread, crackers, tortillas, cereal, and pasta. Unsweetened oatmeal. Quinoa. Brown or wild rice. Meats and other proteins Seafood. Poultry without skin. Lean cuts of poultry and beef. Tofu. Nuts. Seeds. Dairy Low-fat or fat-free dairy products such as milk, yogurt, and cheese. The items listed above may not be a complete list of foods and beverages you can eat. Contact a  dietitian for more information. What foods should I avoid? Fruits Fruits canned with syrup. Vegetables Canned vegetables. Frozen vegetables with butter or cream sauce. Grains Refined white flour and flour products such as bread, pasta, snack foods, and cereals. Avoid all processed foods. Meats and other proteins Fatty cuts of meat. Poultry with skin. Breaded or fried meats. Processed meat. Avoid saturated fats. Dairy Full-fat yogurt, cheese, or milk. Beverages Sweetened drinks, such as soda or iced tea. The items listed above may not be a complete list of foods and beverages you should avoid. Contact a dietitian for more information. Questions to ask a health care provider  Do I need to meet with a diabetes educator?  Do I need to meet with a dietitian?  What number can I call if I have questions?  When are the best times to check my blood glucose? Where to find more information:  American Diabetes Association: diabetes.org  Academy of Nutrition and Dietetics: www.eatright.[employer] of Diabetes and Digestive  and Kidney Diseases: [employer]  Association of Diabetes Care and Education Specialists: www.diabeteseducator.org Summary  It is important to have healthy eating habits because your blood sugar (glucose) levels are greatly affected by what you eat and drink.  A healthy meal plan will help you control your blood glucose and maintain a healthy lifestyle.  Your health care provider may recommend that you work with a dietitian to make a meal plan that is best for you.  Keep in mind that carbohydrates (carbs) and alcohol have immediate effects on your blood glucose levels. It is important to count carbs and to use alcohol carefully. This information is not intended to replace advice given to you by your health care provider. Make sure you discuss any questions you have with your health care provider. Document Revised: 04/02/2019 Document Reviewed:  04/02/2019 Elsevier Patient Education  2021 [employer].

## 2020-06-17 NOTE — Assessment & Plan Note (Signed)
Chronic, ongoing.  Continue current medication regimen and adjust as needed. Lipid panel today. 

## 2020-06-17 NOTE — Progress Notes (Signed)
BP 138/84 (BP Location: Left Arm)   Pulse 81   Temp 98 F (36.7 C) (Oral)   Wt 259 lb 12.8 oz (117.8 kg)   SpO2 96%   BMI 39.50 kg/m    Subjective:    Patient ID: Rodney Chary [name], male    DOB: [birth year], 60 y.o.   MRN: [id number]  HPI: Rodney Dixon. is a 60 y.o. male  Chief Complaint  Patient presents with  . Follow-up   DIABETES Last A1C9.1% November, has referrals in place to see endocrinology (placed by PCP and neurology), but has not attended yet and missed appointments -- will not see at [facility] now. Continues on Tresiba, Winifred, Humalog, Farxiga, Metformin 1000 MG BID. He does endorse being an "eater" and not following diabetic diet -- but has decreased meals some and lost 11 pounds. Tried Freestyle, but reports the pads kept falling off and he did not like this.  Is not checking sugars -- although have discussed with him frequently need to check these 3-4 times a day.     Hypoglycemic episodes:no Polydipsia/polyuria:no Visual disturbance:no Chest pain:no Paresthesias:no Glucose Monitoring:not checking Accucheck frequency:not checking Fasting glucose: Post prandial: Evening:  Before meals: Taking Insulin?:yes Long acting insulin: Tresiba50 units Short acting insulin:30units with meals Blood Pressure Monitoring:not checking Retinal Examination:Not up to Date Foot Exam:Up to Date Pneumovax:Up to Date Influenza:refused Aspirin:no  ATRIAL FIBRILLATIONWITH METALLIC VALVE WITH HTN/HLD Currently takes Coumadin8 mg once a day with INR 3.2 last visit in December -- today is 2.8. Saw cardiology last 05/20/20 -- home sleep test ordered at recent visits, but he did not attend due to illness.He is to continue Benazepril and HCTZ, Carvedilol.  Continues on Atorvastatin and Fenofibrate for HLD.  Recent echo noted normal LV function.  Last saw neurology on  02/18/20 for history of recent stroke.    Denies recent headaches or dizziness, no SOB or CP.   Atrial fibrillation status:stable Satisfied with current treatment:yes Medication side effects:no Medication compliance:good compliance Etiology of atrial fibrillation:  Palpitations:no Chest pain:no Dyspnea on exertion:no Orthopnea:no Syncope:no Edema:no Ventricular rate control:B blocker Anti-coagulation:warfarin  Relevant past medical, surgical, family and social history reviewed and updated as indicated. Interim medical history since our last visit reviewed. Allergies and medications reviewed and updated.  Review of Systems  Constitutional: Negative for activity change, diaphoresis, fatigue and fever.  Respiratory: Negative for cough, chest tightness, shortness of breath and wheezing.   Cardiovascular: Negative for chest pain, palpitations and leg swelling.  Gastrointestinal: Negative.   Endocrine: Negative for cold intolerance, heat intolerance, polydipsia, polyphagia and polyuria.  Neurological: Negative.   Psychiatric/Behavioral: Negative.     Per HPI unless specifically indicated above     Objective:    BP 138/84 (BP Location: Left Arm)   Pulse 81   Temp 98 F (36.7 C) (Oral)   Wt 259 lb 12.8 oz (117.8 kg)   SpO2 96%   BMI 39.50 kg/m   Wt Readings from Last 3 Encounters:  06/17/20 259 lb 12.8 oz (117.8 kg)  04/24/20 270 lb 9.6 oz (122.7 kg)  04/07/20 261 lb 6.4 oz (118.6 kg)    Physical Exam Vitals and nursing note reviewed.  Constitutional:      General: He is awake. He is not in acute distress.    Appearance: He is well-developed and well-groomed. He is morbidly obese. He is not ill-appearing.  HENT:     Head: Normocephalic and atraumatic.     Right Ear: Hearing normal. No  drainage.     Left Ear: Hearing normal. No drainage.  Eyes:     General: Lids are normal.        Right eye: No discharge.        Left eye: No discharge.      Conjunctiva/sclera: Conjunctivae normal.     Pupils: Pupils are equal, round, and reactive to light.  Neck:     Thyroid: No thyromegaly.     Vascular: No carotid bruit.     Trachea: Trachea normal.  Cardiovascular:     Rate and Rhythm: Normal rate and regular rhythm.     Heart sounds: Normal heart sounds, S1 normal and S2 normal. No murmur heard. No gallop.   Pulmonary:     Effort: Pulmonary effort is normal. No accessory muscle usage or respiratory distress.     Breath sounds: Normal breath sounds.  Abdominal:     General: Bowel sounds are normal.     Palpations: Abdomen is soft.  Musculoskeletal:        General: Normal range of motion.     Cervical back: Normal range of motion and neck supple.     Right lower leg: No edema.     Left lower leg: No edema.  Skin:    General: Skin is warm and dry.     Capillary Refill: Capillary refill takes less than 2 seconds.     Findings: No rash.  Neurological:     Mental Status: He is alert and oriented to person, place, and time.     Deep Tendon Reflexes: Reflexes are normal and symmetric.  Psychiatric:        Attention and Perception: Attention normal.        Mood and Affect: Mood normal.        Speech: Speech normal.        Behavior: Behavior normal. Behavior is cooperative.        Thought Content: Thought content normal.    Diabetic Foot Exam - Simple   Simple Foot Form Visual Inspection See comments: Yes Sensation Testing Intact to touch and monofilament testing bilaterally: Yes Pulse Check See comments: Yes Comments 1+ DP and PT bilaterally.  Xerosis bilaterally and thick toenails.    Results for orders placed or performed in visit on 04/24/20  CoaguChek XS/INR Waived (STAT)  Result Value Ref Range   INR 3.2 (H) 0.9 - 1.1   Prothrombin Time 38.0 sec      Assessment & Plan:   Problem List Items Addressed This Visit      Cardiovascular and Mediastinum   Chronic atrial fibrillation (HCC)    Chronic, ongoing.  Continue Carvedilol for rate control and Coumadin, refer to s/p valve plan for Coumadin dosing. Continue to collaborate with cardiology, recent note reviewed.  Continue to work with CCM team.        Relevant Orders   CoaguChek XS/INR Waived   Hypertension associated with type 2 diabetes mellitus (HCC)    Chronic, ongoing with BP near goal today on recheck.  Educated him on diabetes effect on vascular system.  Continue Benazepril, HCTZ, Coreg, and Fenofibrate/Atorvastatin.  Recommend he monitor BP at home at least a few mornings a week + focus on DASH diet.  Discussed with him stroke prevention goal for BP <130/90.  BMP today.  Return in 3 months.      Relevant Orders   Bayer DCA Hb A1c Waived (STAT)   Basic Metabolic Panel (BMET)     Endocrine  Type 2 diabetes mellitus with proteinuria (HCC) - Primary    Chronic, ongoing with A1C >14% today, continues above goal and have discussed this at length with him.  Urine ALB today 30.  Continue Metformin,  Farxiga, Tresiba, Ozempic, and Humalog.  Increase Tresiba to 60 units at this time.  Continue Benazepril for kidney protection.  Focus heavily on diabetic diet and modest weight loss, decreasing carb and sugar intake at home.  Recommend he check BS 3-4 times a day and document for next visit for review.  Will continue to work with CCM team to ensure adherence to medication regimen and diet, which is a major issue.  New referral to endocrinology placed -- highly recommended he attend a visit with them and not miss visits.  Discussed at length his A1C goal for stroke prevention being 6.5% or less.  Return in 4 weeks for follow-up.      Relevant Orders   Bayer DCA Hb A1c Waived (STAT)   Basic Metabolic Panel (BMET)   Microalbumin, Urine Waived   Ambulatory referral to Endocrinology   Hyperlipidemia associated with type 2 diabetes mellitus (HCC)    Chronic, ongoing.  Continue current medication regimen and adjust as needed.  Lipid panel today.       Relevant Orders   Bayer DCA Hb A1c Waived (STAT)   Lipid Profile   Type 2 diabetes mellitus with diabetic chronic kidney disease (HCC)    Chronic, ongoing with A1C >14% today, continues above goal and have discussed this at length with him.  Continue Metformin,  Farxiga, Tresiba, Ozempic, and Humalog.  Increase Tresiba to 60 units at this time.  Continue Benazepril for kidney protection.  Focus heavily on diabetic diet and modest weight loss, decreasing carb and sugar intake at home.  Recommend he check BS 3-4 times a day and document for next visit for review.  Will continue to work with CCM team to ensure adherence to medication regimen and diet, which is a major issue.  New referral to endocrinology placed -- highly recommended he attend a visit with them and not miss visits.  Discussed at length his A1C goal for stroke prevention being 6.5% or less.  Return in 4 weeks for follow-up.        Genitourinary   CKD (chronic kidney disease) stage 3, GFR 30-59 ml/min (HCC)    Chronic, ongoing with T2DM.  Continue Benazepril for kidney protection.  Recommend heavy focus on controlling his diabetes to prevent worsening CKD and possible need for dialysis in future.  BMP today.        Hematopoietic and Hemostatic   Acquired thrombophilia (HCC)    Monitor CBC with use of daily Coumadin for mechanical valve and a-fib.  Denies any current bleeding episodes.  Check CBC next visit.        Other   S/P aortic valve replacement with metallic valve    Chronic, ongoing with Coumadin use.  INR today 2.8, stable.  Continue Coumadin 8 MG daily, discussed with patient and this is his preference at this time.  Return in 4 weeks for INR check.  Return sooner to office if any bleeding or concerns.       Morbid obesity (HCC)    BMI 39.50.  Recommended eating smaller high protein, low fat meals more frequently and exercising 30 mins a day 5 times a week with a goal of 10-15lb weight loss in the next 3 months.  Patient voiced their understanding and motivation to adhere to  these recommendations.       Noncompliance w/medication treatment due to intermit use of medication    Major concern and reiterated to patient necessity of adhering to medication and diet regimen as he is at high risk for recurrent cardiac event with current co morbidities.  Continue to work along with CCM team and continue reiterating education on diabetes effect on vascular system.      BMI 40.0-44.9, adult (HCC)    Recommend focus on healthy diet choices and regular activity.      Long-term insulin use (HCC)    Chronic, ongoing.  Refer to diabetes plan of care for further.          Follow up plan: Return in about 4 weeks (around 07/15/2020) for T2DM, INR.

## 2020-06-17 NOTE — Assessment & Plan Note (Signed)
Major concern and reiterated to patient necessity of adhering to medication and diet regimen as he is at high risk for recurrent cardiac event with current co morbidities.  Continue to work along with CCM team and continue reiterating education on diabetes effect on vascular system.

## 2020-06-17 NOTE — Assessment & Plan Note (Signed)
Chronic, ongoing. Continue Carvedilol for rate control and Coumadin, refer to s/p valve plan for Coumadin dosing. Continue to collaborate with cardiology, recent note reviewed.  Continue to work with CCM team.

## 2020-06-17 NOTE — Assessment & Plan Note (Signed)
Chronic, ongoing.  Refer to diabetes plan of care for further. 

## 2020-06-17 NOTE — Assessment & Plan Note (Signed)
Chronic, ongoing with BP near goal today on recheck.  Educated him on diabetes effect on vascular system.  Continue Benazepril, HCTZ, Coreg, and Fenofibrate/Atorvastatin.  Recommend he monitor BP at home at least a few mornings a week + focus on DASH diet.  Discussed with him stroke prevention goal for BP <130/90.  BMP today.  Return in 3 months.

## 2020-06-17 NOTE — Assessment & Plan Note (Signed)
BMI 39.50.  Recommended eating smaller high protein, low fat meals more frequently and exercising 30 mins a day 5 times a week with a goal of 10-15lb weight loss in the next 3 months. Patient voiced their understanding and motivation to adhere to these recommendations.

## 2020-06-17 NOTE — Assessment & Plan Note (Signed)
Chronic, ongoing with Coumadin use.  INR today 2.8, stable.  Continue Coumadin 8 MG daily, discussed with patient and this is his preference at this time.  Return in 4 weeks for INR check.  Return sooner to office if any bleeding or concerns.

## 2020-06-17 NOTE — Assessment & Plan Note (Signed)
Recommend focus on healthy diet choices and regular activity.

## 2020-06-17 NOTE — Assessment & Plan Note (Signed)
Chronic, ongoing with T2DM.  Continue Benazepril for kidney protection.  Recommend heavy focus on controlling his diabetes to prevent worsening CKD and possible need for dialysis in future.  BMP today.

## 2020-06-17 NOTE — Assessment & Plan Note (Signed)
Monitor CBC with use of daily Coumadin for mechanical valve and a-fib.  Denies any current bleeding episodes.  Check CBC next visit.

## 2020-06-18 ENCOUNTER — Other Ambulatory Visit: Payer: Self-pay | Admitting: Nurse Practitioner

## 2020-06-18 LAB — LIPID PANEL
Chol/HDL Ratio: 7.4 ratio — ABNORMAL HIGH (ref 0.0–5.0)
Cholesterol, Total: 207 mg/dL — ABNORMAL HIGH (ref 100–199)
HDL: 28 mg/dL — ABNORMAL LOW (ref >39)
LDL Chol Calc (NIH): 72 mg/dL (ref 0–99)
Triglycerides: 682 mg/dL (ref 0–149)
VLDL Cholesterol Cal: 107 mg/dL — ABNORMAL HIGH (ref 5–40)

## 2020-06-18 LAB — BASIC METABOLIC PANEL
BUN/Creatinine Ratio: 19 (ref 10–24)
BUN: 22 mg/dL (ref 8–27)
CO2: 24 mmol/L (ref 20–29)
Calcium: 9.6 mg/dL (ref 8.6–10.2)
Chloride: 95 mmol/L — ABNORMAL LOW (ref 96–106)
Creatinine, Ser: 1.18 mg/dL (ref 0.76–1.27)
GFR calc Af Amer: 77 mL/min/{1.73_m2} (ref >59)
GFR calc non Af Amer: 67 mL/min/{1.73_m2} (ref >59)
Glucose: 395 mg/dL — ABNORMAL HIGH (ref 65–99)
Potassium: 4.5 mmol/L (ref 3.5–5.2)
Sodium: 135 mmol/L (ref 134–144)

## 2020-06-18 MED ORDER — ATORVASTATIN CALCIUM 80 MG PO TABS
80.0000 mg | ORAL_TABLET | Freq: Every day | ORAL | 4 refills | Status: DC
Start: 2020-06-18 — End: 2021-08-24

## 2020-06-18 NOTE — Progress Notes (Signed)
Contacted via MyChart   Good afternoon Mr. Ernsberger, your labs have returned: - Glucose is elevated at 395 -- please ensure you are increasing your Tresiba to 60 units and I would recommend increasing pre meal insulin by 5 units too.   - Cholesterol levels show LDL close to goal, but triglycerides remain elevated at 682.  Were you fasting?  ARe you taking your Atorvastatin and Fenofibrate daily?  Please ensure you do and I would like to increase your Atorvastatin to 80 MG -- the max dose -- for stroke prevention and better control.  I will send this increase in.  Any questions for me?  Please monitor sugar as we discussed and bring to next visit.   Keep being awesome!!  Thank you for allowing me to participate in your care. Kindest regards, Earnstine Meinders

## 2020-06-24 ENCOUNTER — Other Ambulatory Visit: Payer: Self-pay

## 2020-06-24 MED ORDER — GABAPENTIN 300 MG PO CAPS: 600.0000 mg | ORAL_CAPSULE | Freq: Two times a day (BID) | ORAL | 4 refills | Status: DC

## 2020-06-24 NOTE — Telephone Encounter (Signed)
Refill request received for Gabapentin 300 mg cap 2 caps BID QTY 360  LOV 06/17/20 Upcoming visit 07/15/20

## 2020-07-12 ENCOUNTER — Other Ambulatory Visit: Payer: Self-pay | Admitting: Family Medicine

## 2020-07-12 ENCOUNTER — Other Ambulatory Visit: Payer: Self-pay | Admitting: Nurse Practitioner

## 2020-07-12 DIAGNOSIS — I10 Essential (primary) hypertension: Secondary | ICD-10-CM

## 2020-07-12 NOTE — Telephone Encounter (Signed)
Requested Prescriptions  Pending Prescriptions Disp Refills  . benazepril (LOTENSIN) 40 MG tablet [Pharmacy Med Name: BENAZEPRIL HCL 40 MG TABLET] 90 tablet 1    Sig: TAKE 1 TABLET BY MOUTH EVERY DAY     Cardiovascular:  ACE Inhibitors Passed - 07/12/2020 12:52 AM      Passed - Cr in normal range and within 180 days    Creatinine, Ser  Date Value Ref Range Status  06/17/2020 1.18 0.76 - 1.27 mg/dL Final         Passed - K in normal range and within 180 days    Potassium  Date Value Ref Range Status  06/17/2020 4.5 3.5 - 5.2 mmol/L Final         Passed - Patient is not pregnant      Passed - Last BP in normal range    BP Readings from Last 1 Encounters:  06/17/20 138/84         Passed - Valid encounter within last 6 months    Recent Outpatient Visits          3 weeks ago Type 2 diabetes mellitus with proteinuria (HCC)   [facility] [place], [place] T, NP   2 months ago S/P aortic valve replacement with metallic valve   [facility] Gabriel Cirri, NP   3 months ago S/P aortic valve replacement with metallic valve   [facility] Berry, Jolene T, NP   4 months ago Type 2 diabetes mellitus with proteinuria (HCC)   [facility] [place], Jolene T, NP   4 months ago S/P aortic valve replacement with metallic valve   [facility] [place], [name], NP      Future Appointments            In 3 days Cannady, [name], NP [employer]

## 2020-07-12 NOTE — Telephone Encounter (Signed)
Requested Prescriptions  Pending Prescriptions Disp Refills  . hydrochlorothiazide (HYDRODIURIL) 25 MG tablet [Pharmacy Med Name: HYDROCHLOROTHIAZIDE 25 MG TAB] 90 tablet 3    Sig: TAKE 1 TABLET BY MOUTH EVERY DAY     Cardiovascular: Diuretics - Thiazide Passed - 07/12/2020 12:52 AM      Passed - Ca in normal range and within 360 days    Calcium  Date Value Ref Range Status  06/17/2020 9.6 8.6 - 10.2 mg/dL Final         Passed - Cr in normal range and within 360 days    Creatinine, Ser  Date Value Ref Range Status  06/17/2020 1.18 0.76 - 1.27 mg/dL Final         Passed - K in normal range and within 360 days    Potassium  Date Value Ref Range Status  06/17/2020 4.5 3.5 - 5.2 mmol/L Final         Passed - Na in normal range and within 360 days    Sodium  Date Value Ref Range Status  06/17/2020 135 134 - 144 mmol/L Final         Passed - Last BP in normal range    BP Readings from Last 1 Encounters:  06/17/20 138/84         Passed - Valid encounter within last 6 months    Recent Outpatient Visits          3 weeks ago Type 2 diabetes mellitus with proteinuria (HCC)   [facility] Moro, [place] T, NP   2 months ago S/P aortic valve replacement with metallic valve   [facility] Gabriel Cirri, NP   3 months ago S/P aortic valve replacement with metallic valve   [facility] Frederika, Jolene T, NP   4 months ago Type 2 diabetes mellitus with proteinuria (HCC)   [facility] Tow, Jolene T, NP   4 months ago S/P aortic valve replacement with metallic valve   [facility] [place], [name], NP      Future Appointments            In 3 days Cannady, [name], NP [employer], [facility]

## 2020-07-15 ENCOUNTER — Encounter: Payer: Self-pay | Admitting: Nurse Practitioner

## 2020-07-15 ENCOUNTER — Ambulatory Visit (INDEPENDENT_AMBULATORY_CARE_PROVIDER_SITE_OTHER): Payer: 59 | Admitting: Nurse Practitioner

## 2020-07-15 ENCOUNTER — Other Ambulatory Visit: Payer: Self-pay

## 2020-07-15 VITALS — BP 127/77 | HR 83 | Temp 97.7°F | Wt 261.0 lb

## 2020-07-15 DIAGNOSIS — R809 Proteinuria, unspecified: Secondary | ICD-10-CM

## 2020-07-15 DIAGNOSIS — Z954 Presence of other heart-valve replacement: Secondary | ICD-10-CM | POA: Diagnosis not present

## 2020-07-15 DIAGNOSIS — E1129 Type 2 diabetes mellitus with other diabetic kidney complication: Secondary | ICD-10-CM | POA: Diagnosis not present

## 2020-07-15 LAB — COAGUCHEK XS/INR WAIVED
INR: 4.1 — ABNORMAL HIGH (ref 0.9–1.1)
Prothrombin Time: 49.1 s

## 2020-07-15 LAB — BAYER DCA HB A1C WAIVED: HB A1C (BAYER DCA - WAIVED): >14 % — ABNORMAL HIGH (ref <7.0)

## 2020-07-15 NOTE — Assessment & Plan Note (Signed)
Chronic, ongoing with Coumadin use.  INR today 4.1, above goal 2.5 to 3.5. Has not ate many greens lately.  At length discussion with him on plan as he prefers no to change dosing, but instead increase greens intake as previous. Continue Coumadin 8 MG daily, discussed with patient and this is his preference at this time -- recommend he HOLD dose this evening.  Return in 1 week for INR check.  Return sooner to office if any bleeding or concerns.

## 2020-07-15 NOTE — Patient Instructions (Signed)
Diabetes Mellitus and Nutrition, Adult When you have diabetes, or diabetes mellitus, it is very important to have healthy eating habits because your blood sugar (glucose) levels are greatly affected by what you eat and drink. Eating healthy foods in the right amounts, at about the same times every day, can help you:  Control your blood glucose.  Lower your risk of heart disease.  Improve your blood pressure.  Reach or maintain a healthy weight. What can affect my meal plan? Every person with diabetes is different, and each person has different needs for a meal plan. Your health care provider may recommend that you work with a dietitian to make a meal plan that is best for you. Your meal plan may vary depending on factors such as:  The calories you need.  The medicines you take.  Your weight.  Your blood glucose, blood pressure, and cholesterol levels.  Your activity level.  Other health conditions you have, such as heart or kidney disease. How do carbohydrates affect me? Carbohydrates, also called carbs, affect your blood glucose level more than any other type of food. Eating carbs naturally raises the amount of glucose in your blood. Carb counting is a method for keeping track of how many carbs you eat. Counting carbs is important to keep your blood glucose at a healthy level, especially if you use insulin or take certain oral diabetes medicines. It is important to know how many carbs you can safely have in each meal. This is different for every person. Your dietitian can help you calculate how many carbs you should have at each meal and for each snack. How does alcohol affect me? Alcohol can cause a sudden decrease in blood glucose (hypoglycemia), especially if you use insulin or take certain oral diabetes medicines. Hypoglycemia can be a life-threatening condition. Symptoms of hypoglycemia, such as sleepiness, dizziness, and confusion, are similar to symptoms of having too much  alcohol.  Do not drink alcohol if: ? Your health care provider tells you not to drink. ? You are pregnant, may be pregnant, or are planning to become pregnant.  If you drink alcohol: ? Do not drink on an empty stomach. ? Limit how much you use to:  0-1 drink a day for women.  0-2 drinks a day for men. ? Be aware of how much alcohol is in your drink. In the [place] one drink equals one 12 oz bottle of beer (355 mL), one 5 oz glass of wine (148 mL), or one 1 oz glass of hard liquor (44 mL). ? Keep yourself hydrated with water, diet soda, or unsweetened iced tea.  Keep in mind that regular soda, juice, and other mixers may contain a lot of sugar and must be counted as carbs. What are tips for following this plan? Reading food labels  Start by checking the serving size on the "Nutrition Facts" label of packaged foods and drinks. The amount of calories, carbs, fats, and other nutrients listed on the label is based on one serving of the item. Many items contain more than one serving per package.  Check the total grams (g) of carbs in one serving. You can calculate the number of servings of carbs in one serving by dividing the total carbs by 15. For example, if a food has 30 g of total carbs per serving, it would be equal to 2 servings of carbs.  Check the number of grams (g) of saturated fats and trans fats in one serving. Choose foods that have   a low amount or none of these fats.  Check the number of milligrams (mg) of salt (sodium) in one serving. Most people should limit total sodium intake to less than 2,300 mg per day.  Always check the nutrition information of foods labeled as "low-fat" or "nonfat." These foods may be higher in added sugar or refined carbs and should be avoided.  Talk to your dietitian to identify your daily goals for nutrients listed on the label. Shopping  Avoid buying canned, pre-made, or processed foods. These foods tend to be high in fat, sodium, and added  sugar.  Shop around the outside edge of the grocery store. This is where you will most often find fresh fruits and vegetables, bulk grains, fresh meats, and fresh dairy. Cooking  Use low-heat cooking methods, such as baking, instead of high-heat cooking methods like deep frying.  Cook using healthy oils, such as olive, canola, or sunflower oil.  Avoid cooking with butter, cream, or high-fat meats. Meal planning  Eat meals and snacks regularly, preferably at the same times every day. Avoid going long periods of time without eating.  Eat foods that are high in fiber, such as fresh fruits, vegetables, beans, and whole grains. Talk with your dietitian about how many servings of carbs you can eat at each meal.  Eat 4-6 oz (112-168 g) of lean protein each day, such as lean meat, chicken, fish, eggs, or tofu. One ounce (oz) of lean protein is equal to: ? 1 oz (28 g) of meat, chicken, or fish. ? 1 egg. ?  cup (62 g) of tofu.  Eat some foods each day that contain healthy fats, such as avocado, nuts, seeds, and fish.   What foods should I eat? Fruits Berries. Apples. Oranges. Peaches. Apricots. Plums. Grapes. Mango. Papaya. Pomegranate. Kiwi. Cherries. Vegetables Lettuce. Spinach. Leafy greens, including kale, chard, collard greens, and mustard greens. Beets. Cauliflower. Cabbage. Broccoli. Carrots. Green beans. Tomatoes. Peppers. Onions. Cucumbers. Brussels sprouts. Grains Whole grains, such as whole-wheat or whole-grain bread, crackers, tortillas, cereal, and pasta. Unsweetened oatmeal. Quinoa. Brown or wild rice. Meats and other proteins Seafood. Poultry without skin. Lean cuts of poultry and beef. Tofu. Nuts. Seeds. Dairy Low-fat or fat-free dairy products such as milk, yogurt, and cheese. The items listed above may not be a complete list of foods and beverages you can eat. Contact a dietitian for more information. What foods should I avoid? Fruits Fruits canned with  syrup. Vegetables Canned vegetables. Frozen vegetables with butter or cream sauce. Grains Refined white flour and flour products such as bread, pasta, snack foods, and cereals. Avoid all processed foods. Meats and other proteins Fatty cuts of meat. Poultry with skin. Breaded or fried meats. Processed meat. Avoid saturated fats. Dairy Full-fat yogurt, cheese, or milk. Beverages Sweetened drinks, such as soda or iced tea. The items listed above may not be a complete list of foods and beverages you should avoid. Contact a dietitian for more information. Questions to ask a health care provider  Do I need to meet with a diabetes educator?  Do I need to meet with a dietitian?  What number can I call if I have questions?  When are the best times to check my blood glucose? Where to find more information:  American Diabetes Association: diabetes.org  Academy of Nutrition and Dietetics: www.eatright.org  National Institute of Diabetes and Digestive and Kidney Diseases: www.niddk.nih.gov  Association of Diabetes Care and Education Specialists: www.diabeteseducator.org Summary  It is important to have healthy eating   habits because your blood sugar (glucose) levels are greatly affected by what you eat and drink.  A healthy meal plan will help you control your blood glucose and maintain a healthy lifestyle.  Your health care provider may recommend that you work with a dietitian to make a meal plan that is best for you.  Keep in mind that carbohydrates (carbs) and alcohol have immediate effects on your blood glucose levels. It is important to count carbs and to use alcohol carefully. This information is not intended to replace advice given to you by your health care provider. Make sure you discuss any questions you have with your health care provider. Document Revised: 04/02/2019 Document Reviewed: 04/02/2019 Elsevier Patient Education  2021 Elsevier Inc.  

## 2020-07-15 NOTE — Assessment & Plan Note (Signed)
Chronic, ongoing with A1C continuing >14% today, continues above goal and have discussed this at length with him.  See endo next week and HIGHLY recommend he ensure to attend this appointment.  Continue Metformin,  Farxiga, Tresiba, Ozempic, and Humalog.  Increase Tresiba to 60 units at this time.  Continue Benazepril for kidney protection.  Focus heavily on diabetic diet and modest weight loss, decreasing carb and sugar intake at home.  Recommend he check BS 3-4 times a day and document for next visit for review.  Will continue to work with CCM team to ensure adherence to medication regimen and diet, which is a major issue.  New referral to endocrinology placed -- highly recommended he attend a visit with them and not miss visits.  Discussed at length his A1C goal for stroke prevention being 6.5% or less.  Return in 1 week for follow-up after endo visit.

## 2020-07-15 NOTE — Progress Notes (Signed)
BP 127/77   Pulse 83   Temp 97.7 F (36.5 C) (Oral)   Wt 261 lb (118.4 kg)   SpO2 95%   BMI 39.68 kg/m    Subjective:    Patient ID: Rodney Chary [name], male    DOB: November 03, 1960, 60 y.o.   MRN: [id number]  HPI: Rodney Dixon. is a 60 y.o. male  Chief Complaint  Patient presents with  . INR  . Diabetes  . Follow-up   DIABETES Last A1C>14% recent January visit due to ongoing dietary indiscretions, has referrals in place to see endocrinology (placed by PCP and neurology), but has not attended yet and missed appointments -- will not see at [facility] now due to missed visits -- is now scheduled to see [facility] on 07/23/2020. Continues on Tresiba, Franki Monte (is forgetting this at times), Humalog, Farxiga, Metformin 1000 MG BID. He does endorse being an "eater" and not following diabetic diet. Tried Freestyle, but reports the pads kept falling off and he did not like this.  Is not checking sugars -- although have discussed with him frequently need to check these 3-4 times a day.     Hypoglycemic episodes:no Polydipsia/polyuria:no Visual disturbance:no Chest pain:no Paresthesias:no Glucose Monitoring:not checking Accucheck frequency:checked yesterday it was 377 Fasting glucose: Post prandial: Evening:  Before meals: Taking Insulin?:yes Long acting insulin: Tresiba50 units Short acting insulin:30units with meals Blood Pressure Monitoring:not checking Retinal Examination:Not up to Date Foot Exam:Up to Date Pneumovax:Up to Date Influenza:refused Aspirin:no  ATRIAL FIBRILLATIONWITH METALLIC VALVE WITH HTN/HLD Currently takes Coumadin8 mg once a day with INR 2.8 last visit in December -- today is 4.1. Reports he has not been eating lots of greens lately -- feels this has placed INR in higher range.  Saw cardiology last 05/20/20 -- home sleep test ordered at recent visits,  but he did not attend due to illness.He is to continue Benazepril and HCTZ, Carvedilol.  Continues on Atorvastatin and Fenofibrate for HLD.  Recent echo noted normal LV function.  Last saw neurology on 02/18/20 for history of recent stroke.    Denies recent headaches or dizziness, no SOB or CP.   Atrial fibrillation status:stable Satisfied with current treatment:yes Medication side effects:no Medication compliance:good compliance Etiology of atrial fibrillation:  Palpitations:no Chest pain:no Dyspnea on exertion:no Orthopnea:no Syncope:no Edema:no Ventricular rate control:B blocker Anti-coagulation:warfarin  Relevant past medical, surgical, family and social history reviewed and updated as indicated. Interim medical history since our last visit reviewed. Allergies and medications reviewed and updated.  Review of Systems  Constitutional: Negative for activity change, diaphoresis, fatigue and fever.  Respiratory: Negative for cough, chest tightness, shortness of breath and wheezing.   Cardiovascular: Negative for chest pain, palpitations and leg swelling.  Gastrointestinal: Negative.   Endocrine: Negative for cold intolerance, heat intolerance, polydipsia, polyphagia and polyuria.  Neurological: Negative.   Psychiatric/Behavioral: Negative.     Per HPI unless specifically indicated above     Objective:    BP 127/77   Pulse 83   Temp 97.7 F (36.5 C) (Oral)   Wt 261 lb (118.4 kg)   SpO2 95%   BMI 39.68 kg/m   Wt Readings from Last 3 Encounters:  07/15/20 261 lb (118.4 kg)  06/17/20 259 lb 12.8 oz (117.8 kg)  04/24/20 270 lb 9.6 oz (122.7 kg)    Physical Exam Vitals and nursing note reviewed.  Constitutional:      General: He is awake. He is not in acute distress.    Appearance: He  is well-developed and well-groomed. He is morbidly obese. He is not ill-appearing.  HENT:     Head: Normocephalic and atraumatic.     Right Ear: Hearing normal. No  drainage.     Left Ear: Hearing normal. No drainage.  Eyes:     General: Lids are normal.        Right eye: No discharge.        Left eye: No discharge.     Conjunctiva/sclera: Conjunctivae normal.     Pupils: Pupils are equal, round, and reactive to light.  Neck:     Thyroid: No thyromegaly.     Vascular: No carotid bruit.     Trachea: Trachea normal.  Cardiovascular:     Rate and Rhythm: Normal rate and regular rhythm.     Heart sounds: Normal heart sounds, S1 normal and S2 normal. No murmur heard. No gallop.   Pulmonary:     Effort: Pulmonary effort is normal. No accessory muscle usage or respiratory distress.     Breath sounds: Normal breath sounds.  Abdominal:     General: Bowel sounds are normal.     Palpations: Abdomen is soft.  Musculoskeletal:        General: Normal range of motion.     Cervical back: Normal range of motion and neck supple.     Right lower leg: No edema.     Left lower leg: No edema.  Skin:    General: Skin is warm and dry.     Capillary Refill: Capillary refill takes less than 2 seconds.     Findings: No rash.  Neurological:     Mental Status: He is alert and oriented to person, place, and time.     Deep Tendon Reflexes: Reflexes are normal and symmetric.  Psychiatric:        Attention and Perception: Attention normal.        Mood and Affect: Mood normal.        Speech: Speech normal.        Behavior: Behavior normal. Behavior is cooperative.        Thought Content: Thought content normal.    Results for orders placed or performed in visit on 06/17/20  Bayer DCA Hb A1c Waived (STAT)  Result Value Ref Range   HB A1C (BAYER DCA - WAIVED) >14.0 (H) <7.0 %  Basic Metabolic Panel (BMET)  Result Value Ref Range   Glucose 395 (H) 65 - 99 mg/dL   BUN 22 8 - 27 mg/dL   Creatinine, Ser 8.11 0.76 - 1.27 mg/dL   GFR calc non Af Amer 67 >59 mL/min/1.73   GFR calc Af Amer 77 >59 mL/min/1.73   BUN/Creatinine Ratio 19 10 - 24   Sodium 135 134 - 144  mmol/L   Potassium 4.5 3.5 - 5.2 mmol/L   Chloride 95 (L) 96 - 106 mmol/L   CO2 24 20 - 29 mmol/L   Calcium 9.6 8.6 - 10.2 mg/dL  Lipid Profile  Result Value Ref Range   Cholesterol, Total 207 (H) 100 - 199 mg/dL   Triglycerides 914 (HH) 0 - 149 mg/dL   HDL 28 (L) >78 mg/dL   VLDL Cholesterol Cal 107 (H) 5 - 40 mg/dL   LDL Chol Calc (NIH) 72 0 - 99 mg/dL   Chol/HDL Ratio 7.4 (H) 0.0 - 5.0 ratio  CoaguChek XS/INR Waived  Result Value Ref Range   INR 2.8 (H) 0.9 - 1.1   Prothrombin Time 34.0 sec  Microalbumin, Urine  Waived  Result Value Ref Range   Microalb, Ur Waived 30 (H) 0 - 19 mg/L   Creatinine, Urine Waived 50 10 - 300 mg/dL   Microalb/Creat Ratio 30-300 (H) <30 mg/g      Assessment & Plan:   Problem List Items Addressed This Visit      Endocrine   Type 2 diabetes mellitus with proteinuria (HCC) - Primary    Chronic, ongoing with A1C continuing >14% today, continues above goal and have discussed this at length with him.  See endo next week and HIGHLY recommend he ensure to attend this appointment.  Continue Metformin,  Farxiga, Tresiba, Ozempic, and Humalog.  Increase Tresiba to 60 units at this time.  Continue Benazepril for kidney protection.  Focus heavily on diabetic diet and modest weight loss, decreasing carb and sugar intake at home.  Recommend he check BS 3-4 times a day and document for next visit for review.  Will continue to work with CCM team to ensure adherence to medication regimen and diet, which is a major issue.  New referral to endocrinology placed -- highly recommended he attend a visit with them and not miss visits.  Discussed at length his A1C goal for stroke prevention being 6.5% or less.  Return in 1 week for follow-up after endo visit.      Relevant Orders   Bayer DCA Hb A1c Waived     Other   S/P aortic valve replacement with metallic valve    Chronic, ongoing with Coumadin use.  INR today 4.1, above goal 2.5 to 3.5. Has not ate many greens lately.   At length discussion with him on plan as he prefers no to change dosing, but instead increase greens intake as previous. Continue Coumadin 8 MG daily, discussed with patient and this is his preference at this time -- recommend he HOLD dose this evening.  Return in 1 week for INR check.  Return sooner to office if any bleeding or concerns.       Relevant Orders   CoaguChek XS/INR Waived   Morbid obesity (HCC)    BMI 39.68.  Recommended eating smaller high protein, low fat meals more frequently and exercising 30 mins a day 5 times a week with a goal of 10-15lb weight loss in the next 3 months. Patient voiced their understanding and motivation to adhere to these recommendations.           Follow up plan: Return in about 9 days (around 07/24/2020).

## 2020-07-15 NOTE — Assessment & Plan Note (Signed)
BMI 39.68.  Recommended eating smaller high protein, low fat meals more frequently and exercising 30 mins a day 5 times a week with a goal of 10-15lb weight loss in the next 3 months. Patient voiced their understanding and motivation to adhere to these recommendations.

## 2020-07-19 ENCOUNTER — Other Ambulatory Visit: Payer: Self-pay | Admitting: Nurse Practitioner

## 2020-07-23 ENCOUNTER — Ambulatory Visit (INDEPENDENT_AMBULATORY_CARE_PROVIDER_SITE_OTHER): Payer: 59 | Admitting: Endocrinology

## 2020-07-23 ENCOUNTER — Telehealth: Payer: Self-pay

## 2020-07-23 ENCOUNTER — Other Ambulatory Visit: Payer: Self-pay

## 2020-07-23 VITALS — BP 140/80 | HR 82 | Ht 69.0 in | Wt 258.0 lb

## 2020-07-23 DIAGNOSIS — R809 Proteinuria, unspecified: Secondary | ICD-10-CM | POA: Diagnosis not present

## 2020-07-23 DIAGNOSIS — E1129 Type 2 diabetes mellitus with other diabetic kidney complication: Secondary | ICD-10-CM

## 2020-07-23 LAB — POCT GLYCOSYLATED HEMOGLOBIN (HGB A1C): Hemoglobin A1C: 12 % — AB (ref 4.0–5.6)

## 2020-07-23 MED ORDER — NOVOLIN N FLEXPEN 100 UNIT/ML ~~LOC~~ SUPN: 120.0000 [IU] | PEN_INJECTOR | SUBCUTANEOUS | 3 refills | Status: DC

## 2020-07-23 NOTE — Telephone Encounter (Signed)
Faxed medical record to [facility] per request from 05/10/2019 to 05/08/2020 

## 2020-07-23 NOTE — Telephone Encounter (Signed)
Will continue to see patient on my schedule as requested.

## 2020-07-23 NOTE — Telephone Encounter (Signed)
Pt stated ins compay has a code as him having a transplant wich is incorrect pt confused as to why they are stating he has this code was told by ins company to reach out to PCP  office to adjust this as he states he has never had a transplant and is unclear how this got put in his file.Pt would like to speak to practice admin .

## 2020-07-23 NOTE — Progress Notes (Signed)
Subjective:    Patient ID: Rodney Chary [name], male    DOB: [birth year], 60 y.o.   MRN: [id number]  HPI pt is referred by Aura Dials, for diabetes.  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  He takes multiple daily injections, Ozempic, and 2 oral meds.  Pt says cbg varies from 73-400.  It is in general higher as the day goes on.  He says he sometimes misses meds.   Past Medical History:  Diagnosis Date   Allergy    Diabetes mellitus without complication (HCC)    Hyperlipidemia    Hypertension     Past Surgical History:  Procedure Laterality Date   CARDIAC VALVE REPLACEMENT     COLONOSCOPY WITH PROPOFOL N/A 08/19/2019   Procedure: COLONOSCOPY WITH PROPOFOL;  Surgeon: [name], MD;  Location: [facility] ENDOSCOPY;  Service: Gastroenterology;  Laterality: N/A;    Social History   Socioeconomic History   Marital status: Married    Spouse name: Not on file   Number of children: Not on file   Years of education: Not on file   Highest education level: Not on file  Occupational History   Not on file  Tobacco Use   Smoking status: Never Smoker   Smokeless tobacco: Never Used  Vaping Use   Vaping Use: Never used  Substance and Sexual Activity   Alcohol use: No   Drug use: No   Sexual activity: Not on file  Other Topics Concern   Not on file  Social History Narrative   Not on file   Social Determinants of Health   Financial Resource Strain: Not on file  Food Insecurity: Not on file  Transportation Needs: Not on file  Physical Activity: Not on file  Stress: Not on file  Social Connections: Not on file  Intimate Partner Violence: Not on file    Current Outpatient Medications on File Prior to Visit  Medication Sig Dispense Refill   albuterol (VENTOLIN HFA) 108 (90  Base) MCG/ACT inhaler Inhale 2 puffs into the lungs every 6 (six) hours as needed for wheezing or shortness of breath. 8 g 0   atorvastatin (LIPITOR) 80 MG tablet Take 1 tablet (80 mg total) by mouth daily. STOP 40 MG ATORVASTATIN AND START THIS DOSE. 90 tablet 4   benazepril (LOTENSIN) 40 MG tablet TAKE 1 TABLET BY MOUTH EVERY DAY 90 tablet 1   carvedilol (COREG) 25 MG tablet Take 1 tablet (25 mg total) by mouth 2 (two) times daily with a meal. 180 tablet 0   Continuous Blood Gluc Sensor (FREESTYLE LIBRE 14 DAY SENSOR) MISC To check blood sugar four times a day. 2 each 3   dapagliflozin propanediol (FARXIGA) 10 MG TABS tablet Take 10 mg by mouth daily. 90 tablet 4   fenofibrate (TRICOR) 145 MG tablet TAKE 1 TABLET BY MOUTH EVERY DAY 90 tablet 3   gabapentin (NEURONTIN) 300 MG capsule Take 2 capsules (600 mg total) by mouth 2 (two) times daily. Take 600 mg by mouth 2 (two) times daily. 360 capsule 4   Glucose Blood (BLOOD GLUCOSE TEST STRIPS) STRP 1 strip by In Vitro route daily. 100 each 12   hydrochlorothiazide (HYDRODIURIL) 25 MG tablet TAKE 1 TABLET BY MOUTH EVERY DAY 90 tablet 3   IBUPROFEN PO Take  by mouth. Back pain     metFORMIN (GLUCOPHAGE) 500 MG tablet TAKE 2 TABLETS BY MOUTH TWICE A DAY 360 tablet 3   Semaglutide,0.25 or 0.5MG /DOS, (OZEMPIC, 0.25 OR 0.5 MG/DOSE,) 2 MG/1.5ML SOPN Inject 0.5 mg into the skin once a week. 1.5 mL 6   warfarin (COUMADIN) 2.5 MG tablet Take 1 tablet (2.5 mg total) by mouth daily. 90 tablet 1   warfarin (COUMADIN) 4 MG tablet Take 1 tablet (4 mg total) by mouth daily. 90 tablet 1   warfarin (COUMADIN) 5 MG tablet Start taking 7 MG on Monday, Wednesday, Friday, Saturday, Sunday and 7.5 MG on Tuesday and Thursday.  Return in one week.  Utilize 1 and a 1/2 5 MG tablets to get your 7.5 MG dosing. 60 tablet 3   No current facility-administered medications on file prior to visit.    No Known Allergies  Family History  Problem Relation Age of  Onset   Diabetes Mother    Heart disease Mother    Hyperlipidemia Mother    Hypertension Mother     BP 140/80 (BP Location: Right Arm, Patient Position: Sitting, Cuff Size: Large)    Pulse 82    Ht 5\' 9"  (1.753 m)    Wt 258 lb (117 kg)    SpO2 95%    BMI 38.10 kg/m     Review of Systems denies weight loss, sob, and n/v.  No change in chronic depression.       Objective:   Physical Exam VITAL SIGNS:  See vs page GENERAL: no distress Pulses: dorsalis pedis intact bilat.   MSK: no deformity of the feet CV: no leg edema Skin:  no ulcer on the feet.  normal color and temp on the feet. Neuro: sensation is intact to touch on the feet  Lab Results  Component Value Date   HGBA1C 12.0 (A) 07/23/2020   Lab Results  Component Value Date   CREATININE 1.18 06/17/2020   BUN 22 06/17/2020   NA 135 06/17/2020   K 4.5 06/17/2020   CL 95 (L) 06/17/2020   CO2 24 06/17/2020    I have reviewed outside records, and summarized: Pt was noted to have elevated A1c, and referred here.  Pt was advised to redouble dietary rx, and to not miss meds      Assessment & Plan:  Depression: uncontrolled.  This compromises the rx of DM.   Insulin-requiring type 2 DM, with CRI: uncontrolled.  He is not getting the benefit of multiple daily injections  Patient Instructions  good diet and exercise significantly improve the control of your diabetes.  please let me know if you wish to be referred to a dietician.  high blood sugar is very risky to your health.  you should see an eye doctor and dentist every year.  It is very important to get all recommended vaccinations.  Controlling your blood pressure and cholesterol drastically reduces the damage diabetes does to your body.  Those who smoke should quit.  Please discuss these with your doctor.  check your blood sugar twice a day.  vary the time of day when you check, between before the 3 meals, and at bedtime.  also check if you have symptoms of your  blood sugar being too high or too low.  please keep a record of the readings and bring it to your next appointment here (or you can bring the meter itself).  You can write it on any piece of paper.  please call  us sooner if your blood sugar goes below 70, or if most of your readings are over 200. We will need to take this complex situation in stages I have sent a prescription to your pharmacy, to change both current insulins to NPH, 120 units each morning. On this type of insulin schedule, you should eat meals on a regular schedule.  If a meal is missed or significantly delayed, your blood sugar could go low.  Please continue the same other diabetes medications. Please let your primary care provider or I know if you want to see a specialist about the depression. Please call or message [place] next week, to tell us how the blood sugar is doing.   Please come back for a follow-up appointment in 2 months.

## 2020-07-23 NOTE — Telephone Encounter (Signed)
Pt verbalized understanding.

## 2020-07-23 NOTE — Patient Instructions (Signed)
good diet and exercise significantly improve the control of your diabetes.  please let me know if you wish to be referred to a dietician.  high blood sugar is very risky to your health.  you should see an eye doctor and dentist every year.  It is very important to get all recommended vaccinations.  Controlling your blood pressure and cholesterol drastically reduces the damage diabetes does to your body.  Those who smoke should quit.  Please discuss these with your doctor.  check your blood sugar twice a day.  vary the time of day when you check, between before the 3 meals, and at bedtime.  also check if you have symptoms of your blood sugar being too high or too low.  please keep a record of the readings and bring it to your next appointment here (or you can bring the meter itself).  You can write it on any piece of paper.  please call us sooner if your blood sugar goes below 70, or if most of your readings are over 200. We will need to take this complex situation in stages I have sent a prescription to your pharmacy, to change both current insulins to NPH, 120 units each morning. On this type of insulin schedule, you should eat meals on a regular schedule.  If a meal is missed or significantly delayed, your blood sugar could go low.  Please continue the same other diabetes medications. Please let your primary care provider or I know if you want to see a specialist about the depression. Please call or message [place] next week, to tell us how the blood sugar is doing.   Please come back for a follow-up appointment in 2 months.

## 2020-07-23 NOTE — Telephone Encounter (Signed)
Pt PCP was originally  Chartered loss adjuster has been seeing Aura Dials NP  Pt wanted to keep seeing Cannady,Jolene as his pcp Please advise.

## 2020-07-24 ENCOUNTER — Telehealth: Payer: Self-pay | Admitting: Endocrinology

## 2020-07-24 ENCOUNTER — Ambulatory Visit: Payer: 59 | Admitting: Internal Medicine

## 2020-07-24 NOTE — Telephone Encounter (Signed)
Pt called with a question regarding his Novolin N medication. He states his instruction say to inject 120 units, however his pen says it is 60 so he is wondering if he injects 2 pens?  Callback # [phone]

## 2020-07-24 NOTE — Telephone Encounter (Signed)
Spoke with pt to let him know that he will need to inject hisself 2x bc the pen only goes up to 60u.

## 2020-07-29 ENCOUNTER — Ambulatory Visit: Payer: 59 | Admitting: Nurse Practitioner

## 2020-08-03 ENCOUNTER — Encounter: Payer: Self-pay | Admitting: Nurse Practitioner

## 2020-08-03 ENCOUNTER — Ambulatory Visit (INDEPENDENT_AMBULATORY_CARE_PROVIDER_SITE_OTHER): Payer: 59 | Admitting: Nurse Practitioner

## 2020-08-03 ENCOUNTER — Other Ambulatory Visit: Payer: Self-pay

## 2020-08-03 VITALS — BP 152/90 | HR 82 | Temp 98.6°F | Wt 265.8 lb

## 2020-08-03 DIAGNOSIS — Z952 Presence of prosthetic heart valve: Secondary | ICD-10-CM

## 2020-08-03 NOTE — Assessment & Plan Note (Signed)
Chronic, ongoing with Coumadin use.  INR today 4.7, above goal 2.5 to 3.5.  Change Coumadin to 7.5 MG daily, discussed with patient and this is his preference at this time -- recommend he HOLD dose this evening.  Return in 1 week for INR check.  Return sooner to office if any bleeding or concerns.

## 2020-08-03 NOTE — Patient Instructions (Signed)
Hold Coumadin tonight and then start 7.5 MG nightly tomorrow night.     Vitamin K Foods and Warfarin Warfarin is a blood thinner (anticoagulant). Anticoagulant medicines help prevent the formation of blood clots. Warfarin works by blocking the activity of vitamin K, which promotes normal blood clotting. When you take warfarin, problems can occur from suddenly increasing or decreasing the amount of vitamin K that you eat from one day to the next. These problems can occur due to varying levels of warfarin in your blood. Problems may include:  Blood clots.  Bleeding. What are tips for eating the right amount of vitamin K? To avoid problems when taking warfarin:  Eat a balanced diet that includes: ? Fresh fruits and vegetables. ? Whole grains. ? Low-fat dairy products. ? Lean proteins, such as fish, eggs, and lean cuts of meat.  Keep your intake of vitamin K consistent from day to day. To do this: ? Avoid eating large amounts of vitamin K one day and low amounts of vitamin K the next day. ? If you take a multivitamin that contains vitamin K, be sure to take it every day. ? Know which foods contain vitamin K. Read food labels. Use the lists below to understand serving sizes and the amount of vitamin K in one serving.  Avoid major changes in your diet. If you are going to change your diet, talk with your health care provider before making changes.  Work with a Dealer (dietitian) to develop a meal plan that works best for you.   What foods are high in vitamin K? Foods that are high in vitamin K contain more than 100 mcg (micrograms) per serving. These include:  Broccoli (cooked from fresh) -  cup (78 g) has 110 mcg.  Brussels sprouts (cooked from fresh) -  cup (78 g) has 109 mcg.  Greens, beet (cooked from fresh) -  cup (72 g) has 350 mcg.  Greens, collard (cooked from fresh) -  cup (66 g) has 263 mcg.  Greens, turnip (cooked from fresh) -  cup (72 g) has 265  mcg.  Green onions or scallions -  cup (50 g) has 105 mcg.  Kale (cooked from fresh) -  cup (68 g) has 536 mcg.  Parsley (raw) - 10 sprigs (10 g) has 164 mcg.  Spinach (cooked from fresh) -  cup (90 g) has 444 mcg.  Swiss chard (cooked from fresh) -  cup (88 g) has 287 mcg.   What foods have a moderate amount of vitamin K? Foods that have a moderate amount of vitamin K contain 25-100 mcg per serving. These include:  Asparagus (cooked from fresh) - 4 spears (60 g) have 30 mcg.  Black-eyed peas (dried) -  cup (85 g) has 32 mcg.  Cabbage (cooked from fresh) -  cup (78 g) has 84 mcg.  Cabbage (raw) -  cup (35 g) has 26 mcg.  Kiwi fruit - 1 medium (69 g) has 27 mcg.  Lettuce (raw) - 1 cup (36 g) has 45 mcg.  Okra (cooked from fresh) -  cup (80 g) has 32 mcg.  Prunes (dried) - 5 prunes (47 g) have 25 mcg.  Watercress (raw) - 1 cup (34 g) has 85 mcg. What foods are low in vitamin K? Foods low in vitamin K contain less than 25 mcg per serving. These include:  Artichoke - 1 medium (128 g) has 18 mcg.  Avocado - 1 oz (21 g) has 6 mcg.  Blueberries -  cup (73 g) has 14 mcg.  Carrots (cooked from fresh) -  cup (78 g) has 11 mcg.  Cauliflower (raw) -  cup (54 g) has 8 mcg.  Cucumber with peel (raw) -  cup (52 g) has 9 mcg.  Grapes -  cup (76 g) has 12 mcg.  Mango - 1 medium (207 g) has 9 mcg.  Mixed nuts - 1 cup (142 g) has 17 mcg.  Pear - 1 medium (178 g) has 8 mcg.  Peas (cooked from fresh) -  cup (80 g) has 20 mcg.  Pickled cucumber - 1 spear (65 g) has 11 mcg.  Sauerkraut (canned) -  cup (118 g) has 16 mcg.  Soybeans (cooked from fresh) -  cup (86 g) has 16 mcg.  Tomato (raw) - 1 medium (123 g) has 10 mcg.  Tomato sauce (raw) -  cup (123 g) has 17 mcg.   What foods do not have vitamin K? If a food contains less than 5 mcg per serving, it is considered to have no vitamin K. These foods include:  Bread and cereal  products.  Cheese.  Eggs.  Fish and shellfish.  Meat and poultry.  Milk and dairy products.  Seeds, such as sunflower or pumpkin seeds. The items listed above may not be a complete list of foods that have high, moderate, and low amounts of vitamin K or do not have vitamin K. Actual amounts of vitamin K may differ depending on processing. Contact a dietitian for more information. Summary  Warfarin is an anticoagulant that prevents blood clots by blocking the activity of vitamin K. It is important to monitor the content of vitamin K in your foods and to be consistent with the amount of vitamin K you take in each day.  Avoid major changes in your diet. If you are going to change your diet, talk with your health care provider before making changes. This information is not intended to replace advice given to you by your health care provider. Make sure you discuss any questions you have with your health care provider. Document Revised: 02/12/2019 Document Reviewed: 02/12/2019 Elsevier Patient Education  2021 [employer].

## 2020-08-03 NOTE — Progress Notes (Signed)
   BP (!) 152/90 (BP Location: Left Arm, Cuff Size: Large)   Pulse 82   Temp 98.6 F (37 C) (Oral)   Wt 265 lb 12.8 oz (120.6 kg)   SpO2 95%   BMI 39.25 kg/m    Subjective:    Patient ID: Rodney Chary [name], male    DOB: February 04, 1961, 60 y.o.   MRN: [id number]  CC: Coumadin management  HPI: This patient is a 60 y.o. male who presents for coumadin management. The expected duration of coumadin treatment is lifelong The reason for anticoagulation is  mechanical heart valve.  Currently taking 8 MG daily, held x 1 dose recently due to elevation in INR.  Had initial visit with endocrinology on 07/23/20, Dr. Everardo All, and was changed to NPH 120 units every morning.  Present Coumadin dose: Taking 8 MG daily Goal: 2.5-3.5  Excessive bruising: no Nose bleeding: no Rectal bleeding: no Prolonged menstrual cycles: N/A Eating diet with consistent amounts of foods containing Vitamin K:yes Any recent antibiotic use? no  Relevant past medical, surgical, family and social history reviewed and updated as indicated. Interim medical history since our last visit reviewed. Allergies and medications reviewed and updated.  ROS: Per HPI unless specifically indicated above     Objective:    BP (!) 152/90 (BP Location: Left Arm, Cuff Size: Large)   Pulse 82   Temp 98.6 F (37 C) (Oral)   Wt 265 lb 12.8 oz (120.6 kg)   SpO2 95%   BMI 39.25 kg/m   Wt Readings from Last 3 Encounters:  08/03/20 265 lb 12.8 oz (120.6 kg)  07/23/20 258 lb (117 kg)  07/15/20 261 lb (118.4 kg)     General: Well appearing, well nourished in no distress.  Normal mood and affect. Skin: No excessive bruising or rash  Last INR: 4.1 Last PT: 49.1    Last CBC:  Lab Results  Component Value Date   WBC 9.6 12/30/2019   HGB 14.3 12/30/2019   HCT 44.0 12/30/2019   MCV 88 12/30/2019   PLT 273 12/30/2019    Results for orders placed or performed in visit on 07/23/20  POCT glycosylated hemoglobin (Hb A1C)  Result Value  Ref Range   Hemoglobin A1C 12.0 (A) 4.0 - 5.6 %   HbA1c POC (<> result, manual entry)     HbA1c, POC (prediabetic range)     HbA1c, POC (controlled diabetic range)         Assessment:     ICD-10-CM   1. History of mechanical aortic valve replacement  Z95.2 CoaguChek XS/INR Waived    Plan:   Discussed current plan face-to-face with patient. For coumadin dosing, elected to hold dose tonight and then start 7.5 MG daily.  Return in one week.. Will plan to recheck INR in one week and if lower then increase dose.

## 2020-08-04 LAB — COAGUCHEK XS/INR WAIVED
INR: 4.7 — ABNORMAL HIGH (ref 0.9–1.1)
Prothrombin Time: 56.7 s

## 2020-08-11 NOTE — Telephone Encounter (Signed)
Called pt to confirm appt for tomorrow went over balance due he is wanting to check on the status of this please advise

## 2020-08-12 ENCOUNTER — Other Ambulatory Visit: Payer: Self-pay

## 2020-08-12 ENCOUNTER — Encounter: Payer: Self-pay | Admitting: Nurse Practitioner

## 2020-08-12 ENCOUNTER — Ambulatory Visit (INDEPENDENT_AMBULATORY_CARE_PROVIDER_SITE_OTHER): Payer: 59 | Admitting: Nurse Practitioner

## 2020-08-12 DIAGNOSIS — Z952 Presence of prosthetic heart valve: Secondary | ICD-10-CM | POA: Diagnosis not present

## 2020-08-12 NOTE — Progress Notes (Signed)
   BP (!) 149/81   Pulse 87   Temp 98.4 F (36.9 C) (Oral)   Wt 265 lb (120.2 kg)   SpO2 95%   BMI 39.13 kg/m    Subjective:    Patient ID: Rodney Chary [name], male    DOB: Mar 25, 1961, 60 y.o.   MRN: [id number]  CC: Coumadin management   HPI: This patient is a 60 y.o. male who presents for coumadin management. The expected duration of coumadin treatment is lifelong The reason for anticoagulation is  mechanical heart valve.  Currently taking 7.5 MG daily  Had initial visit with endocrinology on 07/23/20, Dr. Everardo All, and was changed to NPH 120 units every morning.  Present Coumadin dose: Taking 7.5 MG daily Goal: 2.5-3.5  Excessive bruising: no Nose bleeding: no Rectal bleeding: no Prolonged menstrual cycles: N/A Eating diet with consistent amounts of foods containing Vitamin K:yes Any recent antibiotic use? no  Relevant past medical, surgical, family and social history reviewed and updated as indicated. Interim medical history since our last visit reviewed. Allergies and medications reviewed and updated.  ROS: Per HPI unless specifically indicated above     Objective:    BP (!) 149/81   Pulse 87   Temp 98.4 F (36.9 C) (Oral)   Wt 265 lb (120.2 kg)   SpO2 95%   BMI 39.13 kg/m   Wt Readings from Last 3 Encounters:  08/12/20 265 lb (120.2 kg)  08/03/20 265 lb 12.8 oz (120.6 kg)  07/23/20 258 lb (117 kg)     General: Well appearing, well nourished in no distress.  Normal mood and affect. Skin: No excessive bruising or rash  Last INR: 4.7 Last PT: 56.7    Last CBC:  Lab Results  Component Value Date   WBC 9.6 12/30/2019   HGB 14.3 12/30/2019   HCT 44.0 12/30/2019   MCV 88 12/30/2019   PLT 273 12/30/2019    Results for orders placed or performed in visit on 08/03/20  CoaguChek XS/INR Waived  Result Value Ref Range   INR 4.7 (H) 0.9 - 1.1   Prothrombin Time 56.7 sec       Assessment:     ICD-10-CM   1. Morbid obesity (HCC)  E66.01   2. History of  mechanical aortic valve replacement  Z95.2 CoaguChek XS/INR Waived    CoaguChek XS/INR Waived    Plan:   Discussed current plan face-to-face with patient. For coumadin dosing, elected to continue 7.5 MG daily as INR today stable at 2.9.  Return in four weeks for INR recheck.

## 2020-08-12 NOTE — Assessment & Plan Note (Signed)
BMI 39.13.  Recommended eating smaller high protein, low fat meals more frequently and exercising 30 mins a day 5 times a week with a goal of 10-15lb weight loss in the next 3 months. Patient voiced their understanding and motivation to adhere to these recommendations.

## 2020-08-12 NOTE — Patient Instructions (Signed)
Vitamin K Foods and Warfarin Warfarin is a blood thinner (anticoagulant). Anticoagulant medicines help prevent the formation of blood clots. Warfarin works by blocking the activity of vitamin K, which promotes normal blood clotting. When you take warfarin, problems can occur from suddenly increasing or decreasing the amount of vitamin K that you eat from one day to the next. These problems can occur due to varying levels of warfarin in your blood. Problems may include:  Blood clots.  Bleeding. What are tips for eating the right amount of vitamin K? To avoid problems when taking warfarin:  Eat a balanced diet that includes: ? Fresh fruits and vegetables. ? Whole grains. ? Low-fat dairy products. ? Lean proteins, such as fish, eggs, and lean cuts of meat.  Keep your intake of vitamin K consistent from day to day. To do this: ? Avoid eating large amounts of vitamin K one day and low amounts of vitamin K the next day. ? If you take a multivitamin that contains vitamin K, be sure to take it every day. ? Know which foods contain vitamin K. Read food labels. Use the lists below to understand serving sizes and the amount of vitamin K in one serving.  Avoid major changes in your diet. If you are going to change your diet, talk with your health care provider before making changes.  Work with a nutrition specialist (dietitian) to develop a meal plan that works best for you.   What foods are high in vitamin K? Foods that are high in vitamin K contain more than 100 mcg (micrograms) per serving. These include:  Broccoli (cooked from fresh) -  cup (78 g) has 110 mcg.  Brussels sprouts (cooked from fresh) -  cup (78 g) has 109 mcg.  Greens, beet (cooked from fresh) -  cup (72 g) has 350 mcg.  Greens, collard (cooked from fresh) -  cup (66 g) has 263 mcg.  Greens, turnip (cooked from fresh) -  cup (72 g) has 265 mcg.  Green onions or scallions -  cup (50 g) has 105 mcg.  Kale (cooked from  fresh) -  cup (68 g) has 536 mcg.  Parsley (raw) - 10 sprigs (10 g) has 164 mcg.  Spinach (cooked from fresh) -  cup (90 g) has 444 mcg.  Swiss chard (cooked from fresh) -  cup (88 g) has 287 mcg.   What foods have a moderate amount of vitamin K? Foods that have a moderate amount of vitamin K contain 25-100 mcg per serving. These include:  Asparagus (cooked from fresh) - 4 spears (60 g) have 30 mcg.  Black-eyed peas (dried) -  cup (85 g) has 32 mcg.  Cabbage (cooked from fresh) -  cup (78 g) has 84 mcg.  Cabbage (raw) -  cup (35 g) has 26 mcg.  Kiwi fruit - 1 medium (69 g) has 27 mcg.  Lettuce (raw) - 1 cup (36 g) has 45 mcg.  Okra (cooked from fresh) -  cup (80 g) has 32 mcg.  Prunes (dried) - 5 prunes (47 g) have 25 mcg.  Watercress (raw) - 1 cup (34 g) has 85 mcg. What foods are low in vitamin K? Foods low in vitamin K contain less than 25 mcg per serving. These include:  Artichoke - 1 medium (128 g) has 18 mcg.  Avocado - 1 oz (21 g) has 6 mcg.  Blueberries -  cup (73 g) has 14 mcg.  Carrots (cooked from fresh) -  cup (  78 g) has 11 mcg.  Cauliflower (raw) -  cup (54 g) has 8 mcg.  Cucumber with peel (raw) -  cup (52 g) has 9 mcg.  Grapes -  cup (76 g) has 12 mcg.  Mango - 1 medium (207 g) has 9 mcg.  Mixed nuts - 1 cup (142 g) has 17 mcg.  Pear - 1 medium (178 g) has 8 mcg.  Peas (cooked from fresh) -  cup (80 g) has 20 mcg.  Pickled cucumber - 1 spear (65 g) has 11 mcg.  Sauerkraut (canned) -  cup (118 g) has 16 mcg.  Soybeans (cooked from fresh) -  cup (86 g) has 16 mcg.  Tomato (raw) - 1 medium (123 g) has 10 mcg.  Tomato sauce (raw) -  cup (123 g) has 17 mcg.   What foods do not have vitamin K? If a food contains less than 5 mcg per serving, it is considered to have no vitamin K. These foods include:  Bread and cereal products.  Cheese.  Eggs.  Fish and shellfish.  Meat and poultry.  Milk and dairy products.  Seeds,  such as sunflower or pumpkin seeds. The items listed above may not be a complete list of foods that have high, moderate, and low amounts of vitamin K or do not have vitamin K. Actual amounts of vitamin K may differ depending on processing. Contact a dietitian for more information. Summary  Warfarin is an anticoagulant that prevents blood clots by blocking the activity of vitamin K. It is important to monitor the content of vitamin K in your foods and to be consistent with the amount of vitamin K you take in each day.  Avoid major changes in your diet. If you are going to change your diet, talk with your health care provider before making changes. This information is not intended to replace advice given to you by your health care provider. Make sure you discuss any questions you have with your health care provider. Document Revised: 02/12/2019 Document Reviewed: 02/12/2019 Elsevier Patient Education  2021 [employer]vier Inc.  

## 2020-08-12 NOTE — Assessment & Plan Note (Signed)
Chronic, ongoing with Coumadin use.  INR today 2.9, at goal 2.5 to 3.5.  Continue Coumadin 7.5 MG daily.  Return in 4 weeks for INR check.  Return sooner to office if any bleeding or concerns.

## 2020-08-13 LAB — COAGUCHEK XS/INR WAIVED
INR: 2.9 — ABNORMAL HIGH (ref 0.9–1.1)
Prothrombin Time: 34.6 s

## 2020-09-11 ENCOUNTER — Encounter: Payer: Self-pay | Admitting: Nurse Practitioner

## 2020-09-11 ENCOUNTER — Other Ambulatory Visit: Payer: Self-pay

## 2020-09-11 ENCOUNTER — Ambulatory Visit (INDEPENDENT_AMBULATORY_CARE_PROVIDER_SITE_OTHER): Payer: 59 | Admitting: Nurse Practitioner

## 2020-09-11 VITALS — BP 133/81 | HR 88 | Temp 98.4°F | Wt 266.0 lb

## 2020-09-11 DIAGNOSIS — Z952 Presence of prosthetic heart valve: Secondary | ICD-10-CM | POA: Diagnosis not present

## 2020-09-11 NOTE — Progress Notes (Signed)
   BP 133/81   Pulse 88   Temp 98.4 F (36.9 C) (Oral)   Wt 266 lb (120.7 kg)   SpO2 97%   BMI 39.28 kg/m    Subjective:    Patient ID: Rodney Chary [name], male    DOB: 05-31-1960, 60 y.o.   MRN: [id number]  CC: Coumadin management   HPI: This patient is a 60 y.o. male who presents for coumadin management. The expected duration of coumadin treatment is lifelong The reason for anticoagulation is  mechanical heart valve.  Currently taking 7.5 MG daily  Present Coumadin dose: Taking 7.5 MG daily Goal: 2.5-3.5  Excessive bruising: no Nose bleeding: no Rectal bleeding: no Prolonged menstrual cycles: N/A Eating diet with consistent amounts of foods containing Vitamin K:yes Any recent antibiotic use? no  Relevant past medical, surgical, family and social history reviewed and updated as indicated. Interim medical history since our last visit reviewed. Allergies and medications reviewed and updated.  ROS: Per HPI unless specifically indicated above     Objective:    BP 133/81   Pulse 88   Temp 98.4 F (36.9 C) (Oral)   Wt 266 lb (120.7 kg)   SpO2 97%   BMI 39.28 kg/m   Wt Readings from Last 3 Encounters:  09/11/20 266 lb (120.7 kg)  08/12/20 265 lb (120.2 kg)  08/03/20 265 lb 12.8 oz (120.6 kg)     General: Well appearing, well nourished in no distress.  Normal mood and affect. Skin: No excessive bruising or rash  Last INR: 2.9 Last PT: 34.6    Last CBC:  Lab Results  Component Value Date   WBC 9.6 12/30/2019   HGB 14.3 12/30/2019   HCT 44.0 12/30/2019   MCV 88 12/30/2019   PLT 273 12/30/2019    Results for orders placed or performed in visit on 08/12/20  CoaguChek XS/INR Waived  Result Value Ref Range   INR 2.9 (H) 0.9 - 1.1   Prothrombin Time 34.6 sec       Assessment:     ICD-10-CM   1. History of mechanical aortic valve replacement  Z95.2 CoaguChek XS/INR Waived    Plan:   Discussed current plan face-to-face with patient. For coumadin dosing,  elected to increase to 7.5 MG five days a week and 8 MG the remainder.  Return in two weeks for INR recheck and chronic disease visit.

## 2020-09-11 NOTE — Patient Instructions (Signed)
Increase Coumadin dosing to 8 MG on Friday and Tuesday and 7.5 MG the remainder of days.   Vitamin K Foods and Warfarin Warfarin is a blood thinner (anticoagulant). Anticoagulant medicines help prevent the formation of blood clots. Warfarin works by blocking the activity of vitamin K, which promotes normal blood clotting. When you take warfarin, problems can occur from suddenly increasing or decreasing the amount of vitamin K that you eat from one day to the next. These problems can occur due to varying levels of warfarin in your blood. Problems may include:  Blood clots.  Bleeding. What are tips for eating the right amount of vitamin K? To avoid problems when taking warfarin:  Eat a balanced diet that includes: ? Fresh fruits and vegetables. ? Whole grains. ? Low-fat dairy products. ? Lean proteins, such as fish, eggs, and lean cuts of meat.  Keep your intake of vitamin K consistent from day to day. To do this: ? Avoid eating large amounts of vitamin K one day and low amounts of vitamin K the next day. ? If you take a multivitamin that contains vitamin K, be sure to take it every day. ? Know which foods contain vitamin K. Read food labels. Use the lists below to understand serving sizes and the amount of vitamin K in one serving.  Avoid major changes in your diet. If you are going to change your diet, talk with your health care provider before making changes.  Work with a Dealer (dietitian) to develop a meal plan that works best for you.   What foods are high in vitamin K? Foods that are high in vitamin K contain more than 100 mcg (micrograms) per serving. These include:  Broccoli (cooked from fresh) -  cup (78 g) has 110 mcg.  Brussels sprouts (cooked from fresh) -  cup (78 g) has 109 mcg.  Greens, beet (cooked from fresh) -  cup (72 g) has 350 mcg.  Greens, collard (cooked from fresh) -  cup (66 g) has 263 mcg.  Greens, turnip (cooked from fresh) -  cup (72  g) has 265 mcg.  Green onions or scallions -  cup (50 g) has 105 mcg.  Kale (cooked from fresh) -  cup (68 g) has 536 mcg.  Parsley (raw) - 10 sprigs (10 g) has 164 mcg.  Spinach (cooked from fresh) -  cup (90 g) has 444 mcg.  Swiss chard (cooked from fresh) -  cup (88 g) has 287 mcg.   What foods have a moderate amount of vitamin K? Foods that have a moderate amount of vitamin K contain 25-100 mcg per serving. These include:  Asparagus (cooked from fresh) - 4 spears (60 g) have 30 mcg.  Black-eyed peas (dried) -  cup (85 g) has 32 mcg.  Cabbage (cooked from fresh) -  cup (78 g) has 84 mcg.  Cabbage (raw) -  cup (35 g) has 26 mcg.  Kiwi fruit - 1 medium (69 g) has 27 mcg.  Lettuce (raw) - 1 cup (36 g) has 45 mcg.  Okra (cooked from fresh) -  cup (80 g) has 32 mcg.  Prunes (dried) - 5 prunes (47 g) have 25 mcg.  Watercress (raw) - 1 cup (34 g) has 85 mcg. What foods are low in vitamin K? Foods low in vitamin K contain less than 25 mcg per serving. These include:  Artichoke - 1 medium (128 g) has 18 mcg.  Avocado - 1 oz (21 g) has 6  mcg.  Blueberries -  cup (73 g) has 14 mcg.  Carrots (cooked from fresh) -  cup (78 g) has 11 mcg.  Cauliflower (raw) -  cup (54 g) has 8 mcg.  Cucumber with peel (raw) -  cup (52 g) has 9 mcg.  Grapes -  cup (76 g) has 12 mcg.  Mango - 1 medium (207 g) has 9 mcg.  Mixed nuts - 1 cup (142 g) has 17 mcg.  Pear - 1 medium (178 g) has 8 mcg.  Peas (cooked from fresh) -  cup (80 g) has 20 mcg.  Pickled cucumber - 1 spear (65 g) has 11 mcg.  Sauerkraut (canned) -  cup (118 g) has 16 mcg.  Soybeans (cooked from fresh) -  cup (86 g) has 16 mcg.  Tomato (raw) - 1 medium (123 g) has 10 mcg.  Tomato sauce (raw) -  cup (123 g) has 17 mcg.   What foods do not have vitamin K? If a food contains less than 5 mcg per serving, it is considered to have no vitamin K. These foods include:  Bread and cereal  products.  Cheese.  Eggs.  Fish and shellfish.  Meat and poultry.  Milk and dairy products.  Seeds, such as sunflower or pumpkin seeds. The items listed above may not be a complete list of foods that have high, moderate, and low amounts of vitamin K or do not have vitamin K. Actual amounts of vitamin K may differ depending on processing. Contact a dietitian for more information. Summary  Warfarin is an anticoagulant that prevents blood clots by blocking the activity of vitamin K. It is important to monitor the content of vitamin K in your foods and to be consistent with the amount of vitamin K you take in each day.  Avoid major changes in your diet. If you are going to change your diet, talk with your health care provider before making changes. This information is not intended to replace advice given to you by your health care provider. Make sure you discuss any questions you have with your health care provider. Document Revised: 02/12/2019 Document Reviewed: 02/12/2019 Elsevier Patient Education  2021 [employer].

## 2020-09-12 LAB — COAGUCHEK XS/INR WAIVED
INR: 2 — ABNORMAL HIGH (ref 0.9–1.1)
Prothrombin Time: 24.6 s

## 2020-09-18 ENCOUNTER — Other Ambulatory Visit: Payer: Self-pay | Admitting: Nurse Practitioner

## 2020-09-18 DIAGNOSIS — E1169 Type 2 diabetes mellitus with other specified complication: Secondary | ICD-10-CM

## 2020-09-24 ENCOUNTER — Ambulatory Visit: Payer: 59 | Admitting: Endocrinology

## 2020-09-28 ENCOUNTER — Telehealth: Payer: Self-pay

## 2020-09-28 NOTE — Telephone Encounter (Signed)
Called pt to confirm tomorrow's appt. Went over balance with pt and he states that he has contacted [facility] and they told him that they have Jolene listed as a specialist and that we need to contact them to change this. Please look into this.

## 2020-09-29 ENCOUNTER — Ambulatory Visit: Payer: 59 | Admitting: Nurse Practitioner

## 2020-10-08 NOTE — Telephone Encounter (Signed)
Contacted [facility] and was informed that our APPs are credentialed as specialist.   Sent email to [facility] Credentialing Dept for research.

## 2020-10-08 NOTE — Telephone Encounter (Signed)
09/11/20:  Spoke with patient regarding past due balance. Patient states he does not owe balance and that his insurance should be paying. Explained to patient that insurance card and response history says that his copay is $0.00 but insurance  is paying all but $5.00.  Patient states that he is not going to pay balance and that it is our problem that the insurance company is not paying. Explained to patient that the balance is his responsibility and requested that he contact his insurance company to discuss. Also advised patient that he must be prepared to pay balance at next appointment.

## 2020-10-08 NOTE — Telephone Encounter (Signed)
Spoke with patient and informed him that Rodney Dixon is not listed as a specialist.  Advised patient to call insurance company to advise and to provide my telephone number to insurance company for additional questions.

## 2020-10-17 ENCOUNTER — Other Ambulatory Visit: Payer: Self-pay | Admitting: Nurse Practitioner

## 2020-10-17 NOTE — Telephone Encounter (Signed)
dc'd 09/11/20 by provider Aura Dials NP

## 2020-10-19 ENCOUNTER — Other Ambulatory Visit: Payer: Self-pay

## 2020-10-19 ENCOUNTER — Ambulatory Visit (INDEPENDENT_AMBULATORY_CARE_PROVIDER_SITE_OTHER): Payer: 59 | Admitting: Nurse Practitioner

## 2020-10-19 ENCOUNTER — Encounter: Payer: Self-pay | Admitting: Nurse Practitioner

## 2020-10-19 VITALS — BP 125/66 | HR 96 | Temp 98.1°F | Wt 266.0 lb

## 2020-10-19 DIAGNOSIS — E1142 Type 2 diabetes mellitus with diabetic polyneuropathy: Secondary | ICD-10-CM | POA: Insufficient documentation

## 2020-10-19 DIAGNOSIS — Z952 Presence of prosthetic heart valve: Secondary | ICD-10-CM

## 2020-10-19 DIAGNOSIS — E1169 Type 2 diabetes mellitus with other specified complication: Secondary | ICD-10-CM

## 2020-10-19 DIAGNOSIS — E785 Hyperlipidemia, unspecified: Secondary | ICD-10-CM

## 2020-10-19 DIAGNOSIS — M25519 Pain in unspecified shoulder: Secondary | ICD-10-CM

## 2020-10-19 DIAGNOSIS — Z6841 Body Mass Index (BMI) 40.0 and over, adult: Secondary | ICD-10-CM

## 2020-10-19 DIAGNOSIS — E1165 Type 2 diabetes mellitus with hyperglycemia: Secondary | ICD-10-CM

## 2020-10-19 DIAGNOSIS — Z794 Long term (current) use of insulin: Secondary | ICD-10-CM

## 2020-10-19 DIAGNOSIS — Z9114 Patient's other noncompliance with medication regimen: Secondary | ICD-10-CM

## 2020-10-19 DIAGNOSIS — E1122 Type 2 diabetes mellitus with diabetic chronic kidney disease: Secondary | ICD-10-CM | POA: Diagnosis not present

## 2020-10-19 DIAGNOSIS — I482 Chronic atrial fibrillation, unspecified: Secondary | ICD-10-CM

## 2020-10-19 DIAGNOSIS — E1140 Type 2 diabetes mellitus with diabetic neuropathy, unspecified: Secondary | ICD-10-CM

## 2020-10-19 DIAGNOSIS — E1129 Type 2 diabetes mellitus with other diabetic kidney complication: Secondary | ICD-10-CM

## 2020-10-19 DIAGNOSIS — N1831 Chronic kidney disease, stage 3a: Secondary | ICD-10-CM

## 2020-10-19 DIAGNOSIS — M542 Cervicalgia: Secondary | ICD-10-CM | POA: Insufficient documentation

## 2020-10-19 DIAGNOSIS — I152 Hypertension secondary to endocrine disorders: Secondary | ICD-10-CM

## 2020-10-19 DIAGNOSIS — R809 Proteinuria, unspecified: Secondary | ICD-10-CM

## 2020-10-19 DIAGNOSIS — E1159 Type 2 diabetes mellitus with other circulatory complications: Secondary | ICD-10-CM

## 2020-10-19 DIAGNOSIS — IMO0002 Reserved for concepts with insufficient information to code with codable children: Secondary | ICD-10-CM

## 2020-10-19 MED ORDER — GABAPENTIN 300 MG PO CAPS: 600.0000 mg | ORAL_CAPSULE | Freq: Two times a day (BID) | ORAL | 4 refills | Status: DC

## 2020-10-19 MED ORDER — METHOCARBAMOL 500 MG PO TABS: 500.0000 mg | ORAL_TABLET | Freq: Four times a day (QID) | ORAL | 1 refills | Status: DC | PRN

## 2020-10-19 MED ORDER — GABAPENTIN 300 MG PO CAPS: ORAL_CAPSULE | ORAL | 4 refills | Status: DC

## 2020-10-19 NOTE — Assessment & Plan Note (Addendum)
Chronic, ongoing with A1C 10.3% today, continues above goal and have discussed this at length with him -- he missed follow up with endo and have HIGHLY recommended he reschedule. He is not adherent to regimen.  Continue NPH at current dosing and have HIGHLY recommended he restart his Metformin and take [place] daily.  Continue Benazepril for kidney protection.  Focus heavily on diabetic diet and modest weight loss, decreasing carb and sugar intake at home.  Recommend he check BS 3-4 times a day and document for next visit for review.  Will continue to work with CCM team to ensure adherence to medication regimen and diet, which is a major issue.   Discussed at length his A1C goal for stroke prevention being 6.5% or less.  Return in 3 months.

## 2020-10-19 NOTE — Assessment & Plan Note (Signed)
Major concern and reiterated to patient necessity of adhering to medication and diet regimen as he is at high risk for recurrent cardiac event with current co morbidities.  Continue to work along with CCM team and continue reiterating education on diabetes effect on vascular system.

## 2020-10-19 NOTE — Assessment & Plan Note (Signed)
Chronic, ongoing with BP at goal today.  Educated him on diabetes effect on vascular system.  Continue Benazepril, HCTZ, Coreg, and Fenofibrate/Atorvastatin.  Recommend he monitor BP at home at least a few mornings a week + focus on DASH diet.  Discussed with him stroke prevention goal for BP <130/90.  CMP and TSH today.  Return in 3 months.

## 2020-10-19 NOTE — Assessment & Plan Note (Signed)
Chronic, ongoing with A1C 10.3% today, continues above goal and have discussed this at length with him -- he missed follow up with endo and have HIGHLY recommended he reschedule. He is not adherent to regimen.  Continue NPH at current dosing and have HIGHLY recommended he restart his Metformin and take [place] daily.  Gabapentin on board, will adjust and renal dose as needed -- since he has increased dosing on his own (2700 MG total daily at this time).  Focus heavily on diabetic diet and modest weight loss, decreasing carb and sugar intake at home.  Recommend he check BS 3-4 times a day and document for next visit for review.  Will continue to work with CCM team to ensure adherence to medication regimen and diet, which is a major issue.   Discussed at length his A1C goal for stroke prevention being 6.5% or less.  Return in 3 months.

## 2020-10-19 NOTE — Patient Instructions (Signed)
Diabetes Mellitus and Nutrition, Adult When you have diabetes, or diabetes mellitus, it is very important to have healthy eating habits because your blood sugar (glucose) levels are greatly affected by what you eat and drink. Eating healthy foods in the right amounts, at about the same times every day, can help you:  Control your blood glucose.  Lower your risk of heart disease.  Improve your blood pressure.  Reach or maintain a healthy weight. What can affect my meal plan? Every person with diabetes is different, and each person has different needs for a meal plan. Your health care provider may recommend that you work with a dietitian to make a meal plan that is best for you. Your meal plan may vary depending on factors such as:  The calories you need.  The medicines you take.  Your weight.  Your blood glucose, blood pressure, and cholesterol levels.  Your activity level.  Other health conditions you have, such as heart or kidney disease. How do carbohydrates affect me? Carbohydrates, also called carbs, affect your blood glucose level more than any other type of food. Eating carbs naturally raises the amount of glucose in your blood. Carb counting is a method for keeping track of how many carbs you eat. Counting carbs is important to keep your blood glucose at a healthy level, especially if you use insulin or take certain oral diabetes medicines. It is important to know how many carbs you can safely have in each meal. This is different for every person. Your dietitian can help you calculate how many carbs you should have at each meal and for each snack. How does alcohol affect me? Alcohol can cause a sudden decrease in blood glucose (hypoglycemia), especially if you use insulin or take certain oral diabetes medicines. Hypoglycemia can be a life-threatening condition. Symptoms of hypoglycemia, such as sleepiness, dizziness, and confusion, are similar to symptoms of having too much  alcohol.  Do not drink alcohol if: ? Your health care provider tells you not to drink. ? You are pregnant, may be pregnant, or are planning to become pregnant.  If you drink alcohol: ? Do not drink on an empty stomach. ? Limit how much you use to:  0-1 drink a day for women.  0-2 drinks a day for men. ? Be aware of how much alcohol is in your drink. In the [place] one drink equals one 12 oz bottle of beer (355 mL), one 5 oz glass of wine (148 mL), or one 1 oz glass of hard liquor (44 mL). ? Keep yourself hydrated with water, diet soda, or unsweetened iced tea.  Keep in mind that regular soda, juice, and other mixers may contain a lot of sugar and must be counted as carbs. What are tips for following this plan? Reading food labels  Start by checking the serving size on the "Nutrition Facts" label of packaged foods and drinks. The amount of calories, carbs, fats, and other nutrients listed on the label is based on one serving of the item. Many items contain more than one serving per package.  Check the total grams (g) of carbs in one serving. You can calculate the number of servings of carbs in one serving by dividing the total carbs by 15. For example, if a food has 30 g of total carbs per serving, it would be equal to 2 servings of carbs.  Check the number of grams (g) of saturated fats and trans fats in one serving. Choose foods that have   a low amount or none of these fats.  Check the number of milligrams (mg) of salt (sodium) in one serving. Most people should limit total sodium intake to less than 2,300 mg per day.  Always check the nutrition information of foods labeled as "low-fat" or "nonfat." These foods may be higher in added sugar or refined carbs and should be avoided.  Talk to your dietitian to identify your daily goals for nutrients listed on the label. Shopping  Avoid buying canned, pre-made, or processed foods. These foods tend to be high in fat, sodium, and added  sugar.  Shop around the outside edge of the grocery store. This is where you will most often find fresh fruits and vegetables, bulk grains, fresh meats, and fresh dairy. Cooking  Use low-heat cooking methods, such as baking, instead of high-heat cooking methods like deep frying.  Cook using healthy oils, such as olive, canola, or sunflower oil.  Avoid cooking with butter, cream, or high-fat meats. Meal planning  Eat meals and snacks regularly, preferably at the same times every day. Avoid going long periods of time without eating.  Eat foods that are high in fiber, such as fresh fruits, vegetables, beans, and whole grains. Talk with your dietitian about how many servings of carbs you can eat at each meal.  Eat 4-6 oz (112-168 g) of lean protein each day, such as lean meat, chicken, fish, eggs, or tofu. One ounce (oz) of lean protein is equal to: ? 1 oz (28 g) of meat, chicken, or fish. ? 1 egg. ?  cup (62 g) of tofu.  Eat some foods each day that contain healthy fats, such as avocado, nuts, seeds, and fish.   What foods should I eat? Fruits Berries. Apples. Oranges. Peaches. Apricots. Plums. Grapes. Mango. Papaya. Pomegranate. Kiwi. Cherries. Vegetables Lettuce. Spinach. Leafy greens, including kale, chard, collard greens, and mustard greens. Beets. Cauliflower. Cabbage. Broccoli. Carrots. Green beans. Tomatoes. Peppers. Onions. Cucumbers. Brussels sprouts. Grains Whole grains, such as whole-wheat or whole-grain bread, crackers, tortillas, cereal, and pasta. Unsweetened oatmeal. Quinoa. Brown or wild rice. Meats and other proteins Seafood. Poultry without skin. Lean cuts of poultry and beef. Tofu. Nuts. Seeds. Dairy Low-fat or fat-free dairy products such as milk, yogurt, and cheese. The items listed above may not be a complete list of foods and beverages you can eat. Contact a dietitian for more information. What foods should I avoid? Fruits Fruits canned with  syrup. Vegetables Canned vegetables. Frozen vegetables with butter or cream sauce. Grains Refined white flour and flour products such as bread, pasta, snack foods, and cereals. Avoid all processed foods. Meats and other proteins Fatty cuts of meat. Poultry with skin. Breaded or fried meats. Processed meat. Avoid saturated fats. Dairy Full-fat yogurt, cheese, or milk. Beverages Sweetened drinks, such as soda or iced tea. The items listed above may not be a complete list of foods and beverages you should avoid. Contact a dietitian for more information. Questions to ask a health care provider  Do I need to meet with a diabetes educator?  Do I need to meet with a dietitian?  What number can I call if I have questions?  When are the best times to check my blood glucose? Where to find more information:  American Diabetes Association: diabetes.org  Academy of Nutrition and Dietetics: www.eatright.org  National Institute of Diabetes and Digestive and Kidney Diseases: www.niddk.nih.gov  Association of Diabetes Care and Education Specialists: www.diabeteseducator.org Summary  It is important to have healthy eating   habits because your blood sugar (glucose) levels are greatly affected by what you eat and drink.  A healthy meal plan will help you control your blood glucose and maintain a healthy lifestyle.  Your health care provider may recommend that you work with a dietitian to make a meal plan that is best for you.  Keep in mind that carbohydrates (carbs) and alcohol have immediate effects on your blood glucose levels. It is important to count carbs and to use alcohol carefully. This information is not intended to replace advice given to you by your health care provider. Make sure you discuss any questions you have with your health care provider. Document Revised: 04/02/2019 Document Reviewed: 04/02/2019 Elsevier Patient Education  2021 Elsevier Inc.  

## 2020-10-19 NOTE — Assessment & Plan Note (Signed)
Acute with heavy lifting at work.  At this time will hold off on imaging.  Continue OTC medications like Tylenol as needed + OTC Icy/Hot and Voltaren gel.  Script for Robaxin as needed.  Recommend alternating heat and ice at home.  If worsening return to office and will consider imaging.

## 2020-10-19 NOTE — Assessment & Plan Note (Signed)
Chronic, ongoing with Coumadin use.  INR today 3.5, at goal 2.5 to 3.5.  Continue Coumadin 7.5 MG daily five days a week and 8 MG remainder of days.  Return in 4 weeks for INR check.  Return sooner to office if any bleeding or concerns.

## 2020-10-19 NOTE — Assessment & Plan Note (Signed)
BMI 39.28.  Recommended eating smaller high protein, low fat meals more frequently and exercising 30 mins a day 5 times a week with a goal of 10-15lb weight loss in the next 3 months. Patient voiced their understanding and motivation to adhere to these recommendations.

## 2020-10-19 NOTE — Assessment & Plan Note (Signed)
Chronic, ongoing.  Continue current medication regimen and adjust as needed. Lipid panel today. 

## 2020-10-19 NOTE — Assessment & Plan Note (Signed)
Chronic, ongoing.  Refer to diabetes plan of care for further. 

## 2020-10-19 NOTE — Assessment & Plan Note (Signed)
Recommend focus on healthy diet choices and regular activity.

## 2020-10-19 NOTE — Assessment & Plan Note (Signed)
Chronic, ongoing. Continue Carvedilol for rate control and Coumadin, refer to s/p valve plan for Coumadin dosing. Continue to collaborate with cardiology, recent note reviewed.  Continue to work with CCM team.

## 2020-10-19 NOTE — Progress Notes (Signed)
BP 125/66   Pulse 96   Temp 98.1 F (36.7 C) (Oral)   Wt 266 lb (120.7 kg)   SpO2 93%   BMI 39.28 kg/m    Subjective:    Patient ID: Rodney Chary [name], male    DOB: 1960/05/16, 60 y.o.   MRN: [id number]  HPI: Rodney Dixon. is a 60 y.o. male  Chief Complaint  Patient presents with   INR Recheck     Patient is here for INR recheck.    Shoulder Pain   Neck Pain   Extremity Weakness    Patient states he has been having numbness in his legs to where when he gets up from sitting he is off balance and states he legs are weak. Patient states when he is at work he notices pain in his toes and states he takes Gabapentin and states it helps he a little bit.    DIABETES  Last A1C >14% February last saw endo, Dr. Everardo All, 07/23/20 and was changed to NPH 120 units every morning -- had to cancel recent follow-up with him due to timing and work.   Is not taking Ozempic (reports endo told him there was no benefit to this), Marcelline Deist (does not think he is taking), Metformin 1000 MG BID (not taking it because feels it does no good).  He does endorse being an "eater" and not following diabetic diet. Tried Freestyle, but reports the pads kept falling off and he did not like this.  Is not checking sugars -- although have discussed with him frequently need to check these 3-4 times a day.      Continues on Gabapentin ordered 600 MG BID for neuropathy discomfort in toes -- but has been taking more then this for awhile, taking 600 MG in the morning, 900 MG at lunch, and then 1200 MG before bed.  States this does offer some benefit.  Reports noticing the most when he gets off work, as soon as he starts driving home.  Has been noticing some weakness in legs too -- has noticed this for awhile.  Sometimes gets off balance with this -- notices it when he gets up from sitting for long period.  Denies any falls or fractures..   Hypoglycemic episodes:no Polydipsia/polyuria: no Visual disturbance: no Chest pain:  no Paresthesias: no Glucose Monitoring: not checking             Accucheck frequency: not checking             Fasting glucose:              Post prandial:             Evening:              Before meals: Taking Insulin?: yes             Long acting insulin: Tresiba 50 units             Short acting insulin: 30 units with meals Blood Pressure Monitoring: not checking Retinal Examination: Not up to Date Foot Exam: Up to Date Pneumovax: Up to Date Influenza: refused Aspirin: no    ATRIAL FIBRILLATION WITH METALLIC VALVE WITH HTN/HLD Currently takes Coumadin 7.5 MG daily five days a week and 8 MG remainder of days with INR 2 last visit on 09/11/20-- today is 3.5.  Saw cardiology last 05/20/20 -- home sleep test ordered at recent visits, but he did not attend due to illness -- returns  to see them in August.  He is to continue Benazepril and HCTZ, Carvedilol.  Continues on Atorvastatin and Fenofibrate for HLD.  Recent echo noted normal LV function.  Last saw neurology on 02/18/20 for history of recent stroke.    Denies recent headaches or dizziness, no SOB or CP.   Atrial fibrillation status: stable Satisfied with current treatment: yes  Medication side effects:  no Medication compliance: good compliance Etiology of atrial fibrillation:  Palpitations:  no Chest pain:  no Dyspnea on exertion:  no Orthopnea:  no Syncope:  no Edema:  no Ventricular rate control: B blocker Anti-coagulation: warfarin  NECK & SHOULDER PAIN Feels it is from doing a lot of heavy lifting at work, lifting buckets of water and Stay Dry.  Pain from neck radiates to both shoulders after work. Status: uncontrolled Treatments attempted:  Gabapentin and hot shower   Relief with NSAIDs?:  No NSAIDs Taken Location:midline Duration:chronic Severity: 8/10 Quality: dull, aching, and throbbing Frequency: intermittent Radiation: none Aggravating factors: lifting and movement Alleviating factors: rest Weakness:   no Paresthesias / decreased sensation:  no  Fevers:  no    Relevant past medical, surgical, family and social history reviewed and updated as indicated. Interim medical history since our last visit reviewed. Allergies and medications reviewed and updated.  Review of Systems  Constitutional:  Negative for activity change, diaphoresis, fatigue and fever.  Respiratory:  Negative for cough, chest tightness, shortness of breath and wheezing.   Cardiovascular:  Negative for chest pain, palpitations and leg swelling.  Gastrointestinal: Negative.   Endocrine: Negative for cold intolerance, heat intolerance, polydipsia, polyphagia and polyuria.  Neurological: Negative.   Psychiatric/Behavioral: Negative.     Per HPI unless specifically indicated above     Objective:    BP 125/66   Pulse 96   Temp 98.1 F (36.7 C) (Oral)   Wt 266 lb (120.7 kg)   SpO2 93%   BMI 39.28 kg/m   Wt Readings from Last 3 Encounters:  10/19/20 266 lb (120.7 kg)  09/11/20 266 lb (120.7 kg)  08/12/20 265 lb (120.2 kg)    Physical Exam Vitals and nursing note reviewed.  Constitutional:      General: He is awake. He is not in acute distress.    Appearance: He is well-developed and well-groomed. He is morbidly obese. He is not ill-appearing.  HENT:     Head: Normocephalic and atraumatic.     Right Ear: Hearing normal. No drainage.     Left Ear: Hearing normal. No drainage.  Eyes:     General: Lids are normal.        Right eye: No discharge.        Left eye: No discharge.     Conjunctiva/sclera: Conjunctivae normal.     Pupils: Pupils are equal, round, and reactive to light.  Neck:     Thyroid: No thyromegaly.     Vascular: No carotid bruit.     Trachea: Trachea normal.  Cardiovascular:     Rate and Rhythm: Normal rate and regular rhythm.     Heart sounds: Normal heart sounds, S1 normal and S2 normal. No murmur heard.   No gallop.  Pulmonary:     Effort: Pulmonary effort is normal. No accessory  muscle usage or respiratory distress.     Breath sounds: Normal breath sounds.  Abdominal:     General: Bowel sounds are normal.     Palpations: Abdomen is soft.  Musculoskeletal:  General: Normal range of motion.     Right shoulder: Normal.     Cervical back: Normal range of motion and neck supple. No signs of trauma or rigidity. Muscular tenderness present. Normal range of motion.     Right lower leg: No edema.     Left lower leg: No edema.  Skin:    General: Skin is warm and dry.     Capillary Refill: Capillary refill takes less than 2 seconds.     Findings: No rash.  Neurological:     Mental Status: He is alert and oriented to person, place, and time.     Deep Tendon Reflexes: Reflexes are normal and symmetric.  Psychiatric:        Attention and Perception: Attention normal.        Mood and Affect: Mood normal.        Speech: Speech normal.        Behavior: Behavior normal. Behavior is cooperative.        Thought Content: Thought content normal.   Diabetic Foot Exam - Simple   Simple Foot Form Visual Inspection No deformities, no ulcerations, no other skin breakdown bilaterally: Yes Sensation Testing See comments: Yes Pulse Check See comments: Yes Comments 1+ DP/PT bilaterally.  Sensation 6/10 left and 7/10 right.    Results for orders placed or performed in visit on 09/11/20  CoaguChek XS/INR Waived  Result Value Ref Range   INR 2.0 (H) 0.9 - 1.1   Prothrombin Time 24.6 sec      Assessment & Plan:   Problem List Items Addressed This Visit       Cardiovascular and Mediastinum   Chronic atrial fibrillation (HCC)    Chronic, ongoing. Continue Carvedilol for rate control and Coumadin, refer to s/p valve plan for Coumadin dosing. Continue to collaborate with cardiology, recent note reviewed.  Continue to work with CCM team.         Hypertension associated with type 2 diabetes mellitus (HCC)    Chronic, ongoing with BP at goal today.  Educated him on  diabetes effect on vascular system.  Continue Benazepril, HCTZ, Coreg, and Fenofibrate/Atorvastatin.  Recommend he monitor BP at home at least a few mornings a week + focus on DASH diet.  Discussed with him stroke prevention goal for BP <130/90.  CMP and TSH today.  Return in 3 months.       Relevant Orders   Bayer DCA Hb A1c Waived   Comprehensive metabolic panel   TSH     Endocrine   Type 2 diabetes mellitus with proteinuria (HCC)    Chronic, ongoing with A1C 10.3% today, continues above goal and have discussed this at length with him -- he missed follow up with endo and have HIGHLY recommended he reschedule. He is not adherent to regimen.  Continue NPH at current dosing and have HIGHLY recommended he restart his Metformin and take [place] daily.  Continue Benazepril for kidney protection with his proteinuria.  Focus heavily on diabetic diet and modest weight loss, decreasing carb and sugar intake at home.  Recommend he check BS 3-4 times a day and document for next visit for review.  Will continue to work with CCM team to ensure adherence to medication regimen and diet, which is a major issue.   Discussed at length his A1C goal for stroke prevention being 6.5% or less.  Return in 3 months.       Relevant Orders   Bayer DCA Hb A1c Waived  Hyperlipidemia associated with type 2 diabetes mellitus (HCC)    Chronic, ongoing.  Continue current medication regimen and adjust as needed.  Lipid panel today.       Relevant Orders   Bayer DCA Hb A1c Waived   Comprehensive metabolic panel   Lipid Panel w/o Chol/HDL Ratio   Type 2 diabetes mellitus with diabetic chronic kidney disease (HCC)    Chronic, ongoing with A1C 10.3% today, continues above goal and have discussed this at length with him -- he missed follow up with endo and have HIGHLY recommended he reschedule. He is not adherent to regimen.  Continue NPH at current dosing and have HIGHLY recommended he restart his Metformin and take [place]  daily.  Continue Benazepril for kidney protection.  Focus heavily on diabetic diet and modest weight loss, decreasing carb and sugar intake at home.  Recommend he check BS 3-4 times a day and document for next visit for review.  Will continue to work with CCM team to ensure adherence to medication regimen and diet, which is a major issue.   Discussed at length his A1C goal for stroke prevention being 6.5% or less.  Return in 3 months.       Type 2 diabetes, uncontrolled, with neuropathy (HCC) - Primary    Chronic, ongoing with A1C 10.3% today, continues above goal and have discussed this at length with him -- he missed follow up with endo and have HIGHLY recommended he reschedule. He is not adherent to regimen.  Continue NPH at current dosing and have HIGHLY recommended he restart his Metformin and take [place] daily.  Gabapentin on board, will adjust and renal dose as needed -- since he has increased dosing on his own (2700 MG total daily at this time).  Focus heavily on diabetic diet and modest weight loss, decreasing carb and sugar intake at home.  Recommend he check BS 3-4 times a day and document for next visit for review.  Will continue to work with CCM team to ensure adherence to medication regimen and diet, which is a major issue.   Discussed at length his A1C goal for stroke prevention being 6.5% or less.  Return in 3 months.         Other   History of mechanical aortic valve replacement    Chronic, ongoing with Coumadin use.  INR today 3.5, at goal 2.5 to 3.5.  Continue Coumadin 7.5 MG daily five days a week and 8 MG remainder of days.  Return in 4 weeks for INR check.  Return sooner to office if any bleeding or concerns.         Relevant Orders   CoaguChek XS/INR Waived   Morbid obesity (HCC)    BMI 39.28.  Recommended eating smaller high protein, low fat meals more frequently and exercising 30 mins a day 5 times a week with a goal of 10-15lb weight loss in the next 3 months. Patient  voiced their understanding and motivation to adhere to these recommendations.        Noncompliance w/medication treatment due to intermit use of medication    Major concern and reiterated to patient necessity of adhering to medication and diet regimen as he is at high risk for recurrent cardiac event with current co morbidities.  Continue to work along with CCM team and continue reiterating education on diabetes effect on vascular system.       BMI 40.0-44.9, adult (HCC)    Recommend focus on healthy diet choices and regular  activity.       Long-term insulin use (HCC)    Chronic, ongoing.  Refer to diabetes plan of care for further.       Neck and shoulder pain    Acute with heavy lifting at work.  At this time will hold off on imaging.  Continue OTC medications like Tylenol as needed + OTC Icy/Hot and Voltaren gel.  Script for Robaxin as needed.  Recommend alternating heat and ice at home.  If worsening return to office and will consider imaging.         Follow up plan: Return in about 4 weeks (around 11/16/2020) for INR check.

## 2020-10-19 NOTE — Assessment & Plan Note (Addendum)
Chronic, ongoing with A1C 10.3% today, continues above goal and have discussed this at length with him -- he missed follow up with endo and have HIGHLY recommended he reschedule. He is not adherent to regimen.  Continue NPH at current dosing and have HIGHLY recommended he restart his Metformin and take [place] daily.  Continue Benazepril for kidney protection with his proteinuria.  Focus heavily on diabetic diet and modest weight loss, decreasing carb and sugar intake at home.  Recommend he check BS 3-4 times a day and document for next visit for review.  Will continue to work with CCM team to ensure adherence to medication regimen and diet, which is a major issue.   Discussed at length his A1C goal for stroke prevention being 6.5% or less.  Return in 3 months.

## 2020-10-20 ENCOUNTER — Other Ambulatory Visit: Payer: Self-pay | Admitting: Nurse Practitioner

## 2020-10-20 ENCOUNTER — Other Ambulatory Visit: Payer: Self-pay

## 2020-10-20 DIAGNOSIS — E1169 Type 2 diabetes mellitus with other specified complication: Secondary | ICD-10-CM

## 2020-10-20 LAB — COMPREHENSIVE METABOLIC PANEL
ALT: 16 IU/L (ref 0–44)
AST: 16 IU/L (ref 0–40)
Albumin/Globulin Ratio: 1.5 (ref 1.2–2.2)
Albumin: 4.3 g/dL (ref 3.8–4.9)
Alkaline Phosphatase: 59 IU/L (ref 44–121)
BUN/Creatinine Ratio: 20 (ref 10–24)
BUN: 38 mg/dL — ABNORMAL HIGH (ref 8–27)
Bilirubin Total: 0.2 mg/dL (ref 0.0–1.2)
CO2: 24 mmol/L (ref 20–29)
Calcium: 9.7 mg/dL (ref 8.6–10.2)
Chloride: 95 mmol/L — ABNORMAL LOW (ref 96–106)
Creatinine, Ser: 1.94 mg/dL — ABNORMAL HIGH (ref 0.76–1.27)
Globulin, Total: 2.8 g/dL (ref 1.5–4.5)
Glucose: 206 mg/dL — ABNORMAL HIGH (ref 65–99)
Potassium: 4.1 mmol/L (ref 3.5–5.2)
Sodium: 133 mmol/L — ABNORMAL LOW (ref 134–144)
Total Protein: 7.1 g/dL (ref 6.0–8.5)
eGFR: 39 mL/min/{1.73_m2} — ABNORMAL LOW (ref >59)

## 2020-10-20 LAB — LIPID PANEL W/O CHOL/HDL RATIO
Cholesterol, Total: 168 mg/dL (ref 100–199)
HDL: 24 mg/dL — ABNORMAL LOW (ref >39)
LDL Chol Calc (NIH): 87 mg/dL (ref 0–99)
Triglycerides: 342 mg/dL — ABNORMAL HIGH (ref 0–149)
VLDL Cholesterol Cal: 57 mg/dL — ABNORMAL HIGH (ref 5–40)

## 2020-10-20 LAB — COAGUCHEK XS/INR WAIVED
INR: 3.5 — ABNORMAL HIGH (ref 0.9–1.1)
Prothrombin Time: 41.8 s

## 2020-10-20 LAB — BAYER DCA HB A1C WAIVED: HB A1C (BAYER DCA - WAIVED): 10.3 % — ABNORMAL HIGH (ref <7.0)

## 2020-10-20 LAB — TSH: TSH: 3.1 u[IU]/mL (ref 0.450–4.500)

## 2020-10-20 MED ORDER — GABAPENTIN 300 MG PO CAPS: ORAL_CAPSULE | ORAL | 4 refills | Status: DC

## 2020-10-20 MED ORDER — METFORMIN HCL 500 MG PO TABS: 500.0000 mg | ORAL_TABLET | Freq: Two times a day (BID) | ORAL | 4 refills | Status: DC

## 2020-10-20 NOTE — Progress Notes (Signed)
Contacted via MyChart   Good morning Rodney Dixon, your labs have returned: - Kidney function shows some decline this check, creatinine and eGFR, this is partially due to your poorly controlled diabetes.  We need to get this under better control.  I would recommend you start taking Metformin 500 MG twice a day only (this is kidney dosed -- do no take more then this) and take your Ozempic 0.5 MG weekly and Farxiga 10 MG daily, along with your NPH as ordered.  PLEASE ENSURE YOU FOLLOW-UP WITH DR. Lissa Merlin!!  If ongoing decline in kidney function next visit we will need to get you in with kidney doctor. - Sodium, salt, is a little low.  Recommend adding a touch of salt to diet daily and we will recheck next visit. - Cholesterol levels stable -- but please ensure you are taking you Fenofibrate and Atorvastatin daily. - thyroid is normal. Any questions? Keep being awesome!!  Thank you for allowing me to participate in your care.  I appreciate you. Kindest regards, Peytin Dechert

## 2020-10-20 NOTE — Telephone Encounter (Signed)
Pharmacy need update prescription which is this one below but somehow they never received it due to the "No Print" being selected in the prescription. I have changed it back to normal.

## 2020-11-05 ENCOUNTER — Ambulatory Visit: Payer: Self-pay | Admitting: *Deleted

## 2020-11-05 NOTE — Telephone Encounter (Signed)
Blood sugars running high this week. Yesterday 400-469. Today cbg 130's. Has been out of supplies until recently so no accurate sugar records. 120 units of Novolin N in the am. Metformin 500 mg twice daily. Earlier with blurred vision and HA. Denies N/V. Constant leg and hand muscle cramps-taking gabapentin as prescribed. 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 No sudden change in diet. Continue to drink lots of water today. Record sugars this evening and am fasting tomorrow. Reviewed urgent symptoms if occurred again he should seek treatment at the ED. Requesting 1st available appointment with any provider.   Reason for Disposition  [1] Symptoms of high blood sugar (e.g., frequent urination, weak, weight loss) AND [2] not able to test blood glucose  Answer Assessment - Initial Assessment Questions 1. BLOOD GLUCOSE: "What is your blood glucose level?"      130 today 2. ONSET: "When did you check the blood glucose?"     Yesterday 448 and during the night it was 469 3. USUAL RANGE: "What is your glucose level usually?" (e.g., usual fasting morning value, usual evening value)      4. KETONES: "Do you check for ketones (urine or blood test strips)?" If yes, ask: "What does the test show now?"      no 5. TYPE 1 or 2:  "Do you know what type of diabetes you have?"  (e.g., Type 1, Type 2, Gestational; doesn't know)      Type 2 6. INSULIN: "Do you take insulin?" "What type of insulin(s) do you use? What is the mode of delivery? (syringe, pen; injection or pump)?"      yes 7. DIABETES PILLS: "Do you take any pills for your diabetes?" If yes, ask: "Have you missed taking any pills recently?"     Metformin 500 mg twice daily 8. OTHER SYMPTOMS: "Do you have any symptoms?" (e.g., fever, frequent urination, difficulty breathing, dizziness, weakness, vomiting)     Dizziness, some blurred vision 9. PREGNANCY: "Is there any chance you are pregnant?" "When was your last menstrual period?"      na  Protocols used: Diabetes - High Blood Sugar-A-AH

## 2020-11-06 ENCOUNTER — Ambulatory Visit (INDEPENDENT_AMBULATORY_CARE_PROVIDER_SITE_OTHER): Payer: 59 | Admitting: Nurse Practitioner

## 2020-11-06 ENCOUNTER — Ambulatory Visit (INDEPENDENT_AMBULATORY_CARE_PROVIDER_SITE_OTHER): Payer: 59 | Admitting: Endocrinology

## 2020-11-06 ENCOUNTER — Other Ambulatory Visit: Payer: Self-pay

## 2020-11-06 ENCOUNTER — Encounter: Payer: Self-pay | Admitting: Nurse Practitioner

## 2020-11-06 VITALS — BP 125/85 | HR 84 | Temp 97.5°F | Wt 257.4 lb

## 2020-11-06 VITALS — BP 160/90 | HR 88 | Ht 69.0 in | Wt 260.2 lb

## 2020-11-06 DIAGNOSIS — E1165 Type 2 diabetes mellitus with hyperglycemia: Secondary | ICD-10-CM

## 2020-11-06 DIAGNOSIS — R809 Proteinuria, unspecified: Secondary | ICD-10-CM

## 2020-11-06 DIAGNOSIS — IMO0002 Reserved for concepts with insufficient information to code with codable children: Secondary | ICD-10-CM

## 2020-11-06 DIAGNOSIS — Z952 Presence of prosthetic heart valve: Secondary | ICD-10-CM | POA: Diagnosis not present

## 2020-11-06 DIAGNOSIS — E1129 Type 2 diabetes mellitus with other diabetic kidney complication: Secondary | ICD-10-CM

## 2020-11-06 DIAGNOSIS — E1140 Type 2 diabetes mellitus with diabetic neuropathy, unspecified: Secondary | ICD-10-CM | POA: Diagnosis not present

## 2020-11-06 LAB — POCT GLYCOSYLATED HEMOGLOBIN (HGB A1C): Hemoglobin A1C: 10.9 % — AB (ref 4.0–5.6)

## 2020-11-06 MED ORDER — GABAPENTIN 300 MG PO CAPS: ORAL_CAPSULE | ORAL | 4 refills | Status: DC

## 2020-11-06 MED ORDER — NOVOLIN N FLEXPEN 100 UNIT/ML ~~LOC~~ SUPN: 100.0000 [IU] | PEN_INJECTOR | SUBCUTANEOUS | 3 refills | Status: DC

## 2020-11-06 MED ORDER — OZEMPIC (0.25 OR 0.5 MG/DOSE) 2 MG/1.5ML ~~LOC~~ SOPN: 0.5000 mg | PEN_INJECTOR | SUBCUTANEOUS | 3 refills | Status: DC

## 2020-11-06 NOTE — Patient Instructions (Signed)
Endocrinology [address] [place], [place] [address] [phone] 

## 2020-11-06 NOTE — Patient Instructions (Addendum)
check your blood sugar twice a day.  vary the time of day when you check, between before the 3 meals, and at bedtime.  also check if you have symptoms of your blood sugar being too high or too low.  please keep a record of the readings and bring it to your next appointment here (or you can bring the meter itself).  You can write it on any piece of paper.  please call us sooner if your blood sugar goes below 70, or if most of your readings are over 200. We will need to take this complex situation in stages Please resume the Ozempic, and reduce the NPH insulin to 100 units each morning. On this type of insulin schedule, you should eat meals on a regular schedule.  If a meal is missed or significantly delayed, your blood sugar could go low.  Please continue the same other diabetes medications. Please come back for a follow-up appointment in 2 months.

## 2020-11-06 NOTE — Progress Notes (Signed)
Established Patient Office Visit  Subjective:  Patient ID: Rodney Dixon., male    DOB: 10-02-1960  Age: 60 y.o. MRN: [id number]  CC:  Chief Complaint  Patient presents with   Diabetes    Has been having high blood sugar readings for past week from 448 to 469, took meds and went down to 246. This mornings reading was 115   Leg Pain    Started yesterday feels like the blood is not circulating, and his feet hurt feels like needles in his feet    HPI [name] presents for elevated blood sugars and leg pain.   [name] has noticed some elevated blood sugar readings this week in the 400s. He took his novolog insulin and it went down to the 246. This morning his blood sugar was 115. His A1C 2 weeks ago in the office was 10.3%. He was encouraged to follow-up with endocrine and make sure he was taking his medications. He states that he has only been taking about 60 units of his long acting insulin, because he feels like 120 units is too much. He has been trying to eat better and more vegetables including broccoli and cauliflower. He endorse urinary frequency. Denies chest pain and shortness of breath.   Treson also endorses increasing leg pain with pins and needles. His right leg has been worse than his left recently, however they both have this feeling. The pain is intermittent and is 9/10 in pain at it's worst. He has been taking 679m of gabapentin in the morning, 9031mat lunch, and 1,200 mg at bedtime.    Past Medical History:  Diagnosis Date   Allergy    Diabetes mellitus without complication ([place]   Hyperlipidemia    Hypertension     Past Surgical History:  Procedure Laterality Date   CARDIAC VALVE REPLACEMENT     COLONOSCOPY WITH PROPOFOL N/A 08/19/2019   Procedure: COLONOSCOPY WITH PROPOFOL;  Surgeon: AnJonathon BellowsMD;  [facility];  Service: Gastroenterology;  Laterality: N/A;    Family History  Problem Relation Age of Onset   Diabetes Mother    Heart  disease Mother    Hyperlipidemia Mother    Hypertension Mother     Social History   Socioeconomic History   Marital status: Married    Spouse name: Not on file   Number of children: Not on file   Years of education: Not on file   Highest education level: Not on file  Occupational History   Not on file  Tobacco Use   Smoking status: Never   Smokeless tobacco: Never  Vaping Use   Vaping Use: Never used  Substance and Sexual Activity   Alcohol use: No   Drug use: No   Sexual activity: Not on file  Other Topics Concern   Not on file  Social History Narrative   Not on file   Social Determinants of Health   Financial Resource Strain: Not on file  Food Insecurity: Not on file  Transportation Needs: Not on file  Physical Activity: Not on file  Stress: Not on file  Social Connections: Not on file  Intimate Partner Violence: Not on file    Outpatient Medications Prior to Visit  Medication Sig Dispense Refill   atorvastatin (LIPITOR) 80 MG tablet Take 1 tablet (80 mg total) by mouth daily. STOP 40 MG ATORVASTATIN AND START THIS DOSE. 90 tablet 4   benazepril (LOTENSIN) 40 MG tablet TAKE 1 TABLET BY  MOUTH EVERY DAY 90 tablet 1   carvedilol (COREG) 25 MG tablet Take 1 tablet (25 mg total) by mouth 2 (two) times daily with a meal. 180 tablet 0   Continuous Blood Gluc Sensor (FREESTYLE LIBRE 14 DAY SENSOR) MISC To check blood sugar four times a day. 2 each 3   dapagliflozin propanediol (FARXIGA) 10 MG TABS tablet Take 10 mg by mouth daily. 90 tablet 4   fenofibrate (TRICOR) 145 MG tablet TAKE 1 TABLET BY MOUTH EVERY DAY 90 tablet 3   Glucose Blood (BLOOD GLUCOSE TEST STRIPS) STRP 1 strip by In Vitro route daily. 100 each 12   hydrochlorothiazide (HYDRODIURIL) 25 MG tablet TAKE 1 TABLET BY MOUTH EVERY DAY 90 tablet 3   Insulin NPH, Human,, Isophane, (NOVOLIN N FLEXPEN) 100 UNIT/ML Kiwkpen Inject 120 Units into the skin every morning. And pen needles 1/day 120 mL 3   metFORMIN  (GLUCOPHAGE) 500 MG tablet Take 1 tablet (500 mg total) by mouth 2 (two) times daily. 180 tablet 4   methocarbamol (ROBAXIN) 500 MG tablet Take 1 tablet (500 mg total) by mouth every 6 (six) hours as needed for muscle spasms. 60 tablet 1   warfarin (COUMADIN) 2.5 MG tablet Take 1 tablet (2.5 mg total) by mouth daily. 90 tablet 1   warfarin (COUMADIN) 4 MG tablet Take 1 tablet (4 mg total) by mouth daily. 90 tablet 1   warfarin (COUMADIN) 5 MG tablet Start taking 7 MG on Monday, Wednesday, Friday, Saturday, Sunday and 7.5 MG on Tuesday and Thursday.  Return in one week.  Utilize 1 and a 1/2 5 MG tablets to get your 7.5 MG dosing. 60 tablet 3   gabapentin (NEURONTIN) 300 MG capsule Take 600 mg (two tablets) by mouth in the morning, take 900 MG (3 tablets) by mouth at lunch, and then take 1200 MG (4 tablets) by mouth at night before bed. 360 capsule 4   Semaglutide,0.25 or 0.5MG/DOS, (OZEMPIC, 0.25 OR 0.5 MG/DOSE,) 2 MG/1.5ML SOPN Inject 0.5 mg into the skin once a week. (Patient not taking: Reported on 11/06/2020) 1.5 mL 6   No facility-administered medications prior to visit.    No Known Allergies  ROS Review of Systems  Constitutional:  Positive for fatigue.  HENT: Negative.    Respiratory: Negative.    Cardiovascular: Negative.   Gastrointestinal: Negative.   Endocrine: Positive for polyuria. Negative for polydipsia.  Genitourinary:  Positive for frequency. Negative for dysuria.  Musculoskeletal:        Tingling/pins and needs in legs and feet  Skin: Negative.   Neurological:  Positive for dizziness. Negative for headaches.     Objective:    Physical Exam Vitals and nursing note reviewed.  Constitutional:      Appearance: Normal appearance.  HENT:     Head: Normocephalic.  Eyes:     Conjunctiva/sclera: Conjunctivae normal.  Cardiovascular:     Rate and Rhythm: Normal rate and regular rhythm.     Pulses: Normal pulses.     Heart sounds: Normal heart sounds.  Pulmonary:      Effort: Pulmonary effort is normal.     Breath sounds: Normal breath sounds.  Abdominal:     Palpations: Abdomen is soft.     Tenderness: There is no abdominal tenderness.  Musculoskeletal:     Cervical back: Normal range of motion.  Skin:    General: Skin is warm and dry.  Neurological:     General: No focal deficit present.  Mental Status: He is alert and oriented to person, place, and time.  Psychiatric:        Mood and Affect: Mood normal.        Behavior: Behavior normal.        Thought Content: Thought content normal.        Judgment: Judgment normal.    BP 125/85   Pulse 84   Temp (!) 97.5 F (36.4 C) (Oral)   Wt 257 lb 6.4 oz (116.8 kg)   SpO2 95%   BMI 38.01 kg/m  Wt Readings from Last 3 Encounters:  11/06/20 257 lb 6.4 oz (116.8 kg)  10/19/20 266 lb (120.7 kg)  09/11/20 266 lb (120.7 kg)     There are no preventive care reminders to display for this patient.   There are no preventive care reminders to display for this patient.  Lab Results  Component Value Date   TSH 3.100 10/19/2020   Lab Results  Component Value Date   WBC 9.6 12/30/2019   HGB 14.3 12/30/2019   HCT 44.0 12/30/2019   MCV 88 12/30/2019   PLT 273 12/30/2019   Lab Results  Component Value Date   NA 133 (L) 10/19/2020   K 4.1 10/19/2020   CO2 24 10/19/2020   GLUCOSE 206 (H) 10/19/2020   BUN 38 (H) 10/19/2020   CREATININE 1.94 (H) 10/19/2020   BILITOT 0.2 10/19/2020   ALKPHOS 59 10/19/2020   AST 16 10/19/2020   ALT 16 10/19/2020   PROT 7.1 10/19/2020   ALBUMIN 4.3 10/19/2020   CALCIUM 9.7 10/19/2020   ANIONGAP 11 03/05/2018   EGFR 39 (L) 10/19/2020   Lab Results  Component Value Date   CHOL 168 10/19/2020   Lab Results  Component Value Date   HDL 24 (L) 10/19/2020   Lab Results  Component Value Date   LDLCALC 87 10/19/2020   Lab Results  Component Value Date   TRIG 342 (H) 10/19/2020   Lab Results  Component Value Date   CHOLHDL 7.4 (H) 06/17/2020   Lab  Results  Component Value Date   HGBA1C 10.3 (H) 10/19/2020      Assessment & Plan:   Problem List Items Addressed This Visit       Endocrine   Type 2 diabetes, uncontrolled, with neuropathy ([place]) - Primary    Chronic, not controlled. Last A1C 2 weeks ago was 10.3%. He had an appointment with endocrine in May, however he had to cancel that appointment. CMA called Dr. Cordelia Pen office and was able to get him in for an appointment this afternoon. He said that he will go and encouraged him to follow his medication regimen. He has improved his diet and lost 9 pounds in the last 2 weeks. Congratulated him on this. Improving his blood sugar levels will help with his neurpathy in his legs and feet. Will check a vitamin B12 level today. Increase his gabapentin to 986m in the morning, 9049mat lunch, and 1,20060mt bedtime. Keep scheduled follow-up appointment with PCP.       Relevant Orders   Vitamin B12     Other   History of mechanical aortic valve replacement    Chronic, stable. Well controlled on warfarin. Will check coumadin level per patient request with change in diet.        Relevant Orders   INR/PT    Meds ordered this encounter  Medications   gabapentin (NEURONTIN) 300 MG capsule    Sig: Take 900 mg (three  tablets) by mouth in the morning, take 900 MG (3 tablets) by mouth at lunch, and then take 1200 MG (4 tablets) by mouth at night before bed.    Dispense:  360 capsule    Refill:  4     Follow-up: No follow-ups on file.    Charyl Dancer, NP

## 2020-11-06 NOTE — Assessment & Plan Note (Signed)
Chronic, not controlled. Last A1C 2 weeks ago was 10.3%. He had an appointment with endocrine in May, however he had to cancel that appointment. CMA called Dr. George Hugh office and was able to get him in for an appointment this afternoon. He said that he will go and encouraged him to follow his medication regimen. He has improved his diet and lost 9 pounds in the last 2 weeks. Congratulated him on this. Improving his blood sugar levels will help with his neurpathy in his legs and feet. Will check a vitamin B12 level today. Increase his gabapentin to 900mg  in the morning, 900mg  at lunch, and 1,200mg  at bedtime. Keep scheduled follow-up appointment with PCP.

## 2020-11-06 NOTE — Assessment & Plan Note (Signed)
Chronic, stable. Well controlled on warfarin. Will check coumadin level per patient request with change in diet.

## 2020-11-06 NOTE — Progress Notes (Signed)
Subjective:    Patient ID: Rodney Chary [name], male    DOB: 03/16/1961, 60 y.o.   MRN: [id number]  HPI Pt returns for f/u of diabetes mellitus: DM type: Insulin-requiring type 2 Dx'ed: 2007 Complications: CRI and PN Therapy: insulin since DKA: never Severe hypoglycemia: never Pancreatitis: never Pancreatic imaging: never SDOH: depression complicates the rx of DM Other: he takes qd insulin, after poor results with multiple daily injections Interval history: no cbg record, but states cbg's vary from 120-469.  There is no trend throughout the day.  Pt says he never misses the insulin.  Pt says he does not miss lunch at work, but he has tremor at work in the afternoon.  Eating or drinking helps.  He stopped taking the Ozempic, due to what he feels is a high insulin dosage.   Past Medical History:  Diagnosis Date   Allergy    Diabetes mellitus without complication (HCC)    Hyperlipidemia    Hypertension     Past Surgical History:  Procedure Laterality Date   CARDIAC VALVE REPLACEMENT     COLONOSCOPY WITH PROPOFOL N/A 08/19/2019   Procedure: COLONOSCOPY WITH PROPOFOL;  Surgeon: [name], MD;  [facility];  Service: Gastroenterology;  Laterality: N/A;    Social History   Socioeconomic History   Marital status: Married    Spouse name: Not on file   Number of children: Not on file   Years of education: Not on file   Highest education level: Not on file  Occupational History   Not on file  Tobacco Use   Smoking status: Never   Smokeless tobacco: Never  Vaping Use   Vaping Use: Never used  Substance and Sexual Activity   Alcohol use: No   Drug use: No   Sexual activity: Not on file  Other Topics Concern   Not on file  Social History Narrative   Not on file   Social Determinants of Health   Financial Resource Strain: Not on file  Food Insecurity: Not on file  Transportation Needs: Not on file  Physical Activity: Not on file  Stress: Not on file   Social Connections: Not on file  Intimate Partner Violence: Not on file    Current Outpatient Medications on File Prior to Visit  Medication Sig Dispense Refill   atorvastatin (LIPITOR) 80 MG tablet Take 1 tablet (80 mg total) by mouth daily. STOP 40 MG ATORVASTATIN AND START THIS DOSE. 90 tablet 4   benazepril (LOTENSIN) 40 MG tablet TAKE 1 TABLET BY MOUTH EVERY DAY 90 tablet 1   carvedilol (COREG) 25 MG tablet Take 1 tablet (25 mg total) by mouth 2 (two) times daily with a meal. 180 tablet 0   Continuous Blood Gluc Sensor (FREESTYLE LIBRE 14 DAY SENSOR) MISC To check blood sugar four times a day. 2 each 3   dapagliflozin propanediol (FARXIGA) 10 MG TABS tablet Take 10 mg by mouth daily. 90 tablet 4   fenofibrate (TRICOR) 145 MG tablet TAKE 1 TABLET BY MOUTH EVERY DAY 90 tablet 3   gabapentin (NEURONTIN) 300 MG capsule Take 900 mg (three tablets) by mouth in the morning, take 900 MG (3 tablets) by mouth at lunch, and then take 1200 MG (4 tablets) by mouth at night before bed. 360 capsule 4   Glucose Blood (BLOOD GLUCOSE TEST STRIPS) STRP 1 strip by In Vitro route daily. 100 each 12   hydrochlorothiazide (HYDRODIURIL) 25 MG tablet TAKE 1 TABLET BY MOUTH EVERY DAY  90 tablet 3   metFORMIN (GLUCOPHAGE) 500 MG tablet Take 1 tablet (500 mg total) by mouth 2 (two) times daily. 180 tablet 4   methocarbamol (ROBAXIN) 500 MG tablet Take 1 tablet (500 mg total) by mouth every 6 (six) hours as needed for muscle spasms. 60 tablet 1   warfarin (COUMADIN) 2.5 MG tablet Take 1 tablet (2.5 mg total) by mouth daily. 90 tablet 1   warfarin (COUMADIN) 4 MG tablet Take 1 tablet (4 mg total) by mouth daily. 90 tablet 1   warfarin (COUMADIN) 5 MG tablet Start taking 7 MG on Monday, Wednesday, Friday, Saturday, Sunday and 7.5 MG on Tuesday and Thursday.  Return in one week.  Utilize 1 and a 1/2 5 MG tablets to get your 7.5 MG dosing. 60 tablet 3   No current facility-administered medications on file prior to visit.     No Known Allergies  Family History  Problem Relation Age of Onset   Diabetes Mother    Heart disease Mother    Hyperlipidemia Mother    Hypertension Mother     BP (!) 160/90 (BP Location: Right Arm, Patient Position: Sitting, Cuff Size: Large)   Pulse 88   Ht 5\' 9"  (1.753 m)   Wt 260 lb 3.2 oz (118 kg)   SpO2 90%   BMI 38.42 kg/m    Review of Systems Denies n/v.      Objective:   Physical Exam Pulses: dorsalis pedis intact bilat.   MSK: no deformity of the feet CV: no leg edema Skin:  no ulcer on the feet.  normal color and temp on the feet. Neuro: sensation is intact to touch on the feet  Lab Results  Component Value Date   HGBA1C 10.9 (A) 11/06/2020       Assessment & Plan:  Insulin-requiring type 2 DM: uncontrolled.   Patient Instructions  check your blood sugar twice a day.  vary the time of day when you check, between before the 3 meals, and at bedtime.  also check if you have symptoms of your blood sugar being too high or too low.  please keep a record of the readings and bring it to your next appointment here (or you can bring the meter itself).  You can write it on any piece of paper.  please call 01/07/2021 sooner if your blood sugar goes below 70, or if most of your readings are over 200. We will need to take this complex situation in stages Please resume the Ozempic, and reduce the NPH insulin to 100 units each morning. On this type of insulin schedule, you should eat meals on a regular schedule.  If a meal is missed or significantly delayed, your blood sugar could go low.  Please continue the same other diabetes medications. Please come back for a follow-up appointment in 2 months.

## 2020-11-07 LAB — PROTIME-INR
INR: 2 — ABNORMAL HIGH (ref 0.9–1.2)
Prothrombin Time: 20.4 s — ABNORMAL HIGH (ref 9.1–12.0)

## 2020-11-07 LAB — VITAMIN B12: Vitamin B-12: 507 pg/mL (ref 232–1245)

## 2020-11-10 NOTE — Addendum Note (Signed)
Addended by: Rodman Pickle A on: 11/10/2020 08:58 AM   Modules accepted: Orders

## 2020-11-23 ENCOUNTER — Other Ambulatory Visit: Payer: Self-pay

## 2020-11-23 ENCOUNTER — Encounter: Payer: Self-pay | Admitting: Nurse Practitioner

## 2020-11-23 ENCOUNTER — Other Ambulatory Visit: Payer: Self-pay | Admitting: Nurse Practitioner

## 2020-11-23 ENCOUNTER — Ambulatory Visit (INDEPENDENT_AMBULATORY_CARE_PROVIDER_SITE_OTHER): Payer: 59 | Admitting: Nurse Practitioner

## 2020-11-23 VITALS — BP 138/84 | HR 99 | Temp 98.5°F | Wt 259.8 lb

## 2020-11-23 DIAGNOSIS — I152 Hypertension secondary to endocrine disorders: Secondary | ICD-10-CM

## 2020-11-23 DIAGNOSIS — IMO0002 Reserved for concepts with insufficient information to code with codable children: Secondary | ICD-10-CM

## 2020-11-23 DIAGNOSIS — E1140 Type 2 diabetes mellitus with diabetic neuropathy, unspecified: Secondary | ICD-10-CM | POA: Diagnosis not present

## 2020-11-23 DIAGNOSIS — E1159 Type 2 diabetes mellitus with other circulatory complications: Secondary | ICD-10-CM

## 2020-11-23 DIAGNOSIS — E1165 Type 2 diabetes mellitus with hyperglycemia: Secondary | ICD-10-CM

## 2020-11-23 DIAGNOSIS — Z952 Presence of prosthetic heart valve: Secondary | ICD-10-CM | POA: Diagnosis not present

## 2020-11-23 LAB — COAGUCHEK XS/INR WAIVED
INR: 1.4 — ABNORMAL HIGH (ref 0.9–1.1)
Prothrombin Time: 17.2 s

## 2020-11-23 MED ORDER — HYDROCHLOROTHIAZIDE 25 MG PO TABS: 25.0000 mg | ORAL_TABLET | Freq: Every day | ORAL | 4 refills | Status: DC

## 2020-11-23 MED ORDER — GABAPENTIN 300 MG PO CAPS: ORAL_CAPSULE | ORAL | 4 refills | Status: DC

## 2020-11-23 MED ORDER — DAPAGLIFLOZIN PROPANEDIOL 10 MG PO TABS: 10.0000 mg | ORAL_TABLET | Freq: Every day | ORAL | 4 refills | Status: DC

## 2020-11-23 MED ORDER — BENAZEPRIL HCL 40 MG PO TABS: 40.0000 mg | ORAL_TABLET | Freq: Every day | ORAL | 4 refills | Status: DC

## 2020-11-23 MED ORDER — CARVEDILOL 25 MG PO TABS: 25.0000 mg | ORAL_TABLET | Freq: Two times a day (BID) | ORAL | 4 refills | Status: DC

## 2020-11-23 MED ORDER — WARFARIN SODIUM 4 MG PO TABS: 4.0000 mg | ORAL_TABLET | Freq: Every day | ORAL | 1 refills | Status: DC

## 2020-11-23 NOTE — Assessment & Plan Note (Signed)
Chronic, ongoing with Coumadin use.  INR today 1.4, at goal 2.5 to 3.5 -- has been out of certain doses and taking 7.5 MG only. Elected to take 7.5 MG Coumadin 4 days a week and 8 MG 3 days a week.  Refills on 4 MG tablets sent.  Return in 2 weeks for INR check.  Return sooner to office if any bleeding or concerns.

## 2020-11-23 NOTE — Progress Notes (Signed)
BP 138/84 (BP Location: Left Arm)   Pulse 99   Temp 98.5 F (36.9 C) (Oral)   Wt 259 lb 12.8 oz (117.8 kg)   SpO2 93%   BMI 38.37 kg/m    Subjective:    Patient ID: Rodney Chary [name], male    DOB: 27-Mar-1961, 60 y.o.   MRN: [id number]  CC: Coumadin management   HPI: This patient is a 60 y.o. male who presents for coumadin management. The expected duration of coumadin treatment is lifelong The reason for anticoagulation is  mechanical heart valve.  Currently taking 7.5 MG daily.  Recent INR was 2 on 11/06/20.  Present Coumadin dose: Taking 7.5 MG daily Goal: 2.5-3.5  Excessive bruising: no Nose bleeding: no Rectal bleeding: no Prolonged menstrual cycles: N/A Eating diet with consistent amounts of foods containing Vitamin K:yes Any recent antibiotic use? no  FOOT PAIN Has been out of Gabapentin and needs refills.  Feels pain may be from his boots too. Recent A1c 10.9% and saw endo who recommended he start back on Ozempic and refilled his NPH + continues [place].   Duration: chronic Involved foot: bilateral Mechanism of injury: unknown Location: neuropathy Onset: gradual  Severity: moderate  Quality:  pins and needles Frequency: intermittent Radiation: no Aggravating factors: walking and movement  Alleviating factors:  Gabapentin   Status: fluctuating Treatments attempted:  Gabapentin   Relief with NSAIDs?:  No NSAIDs Taken Weakness with weight bearing or walking: no Morning stiffness: no Swelling: no Redness: no Bruising: no Paresthesias / decreased sensation: yes  Fevers:no   Relevant past medical, surgical, family and social history reviewed and updated as indicated. Interim medical history since our last visit reviewed. Allergies and medications reviewed and updated.  ROS: Per HPI unless specifically indicated above     Objective:    BP 138/84 (BP Location: Left Arm)   Pulse 99   Temp 98.5 F (36.9 C) (Oral)   Wt 259 lb 12.8 oz (117.8 kg)   SpO2 93%    BMI 38.37 kg/m   Wt Readings from Last 3 Encounters:  11/23/20 259 lb 12.8 oz (117.8 kg)  11/06/20 260 lb 3.2 oz (118 kg)  11/06/20 257 lb 6.4 oz (116.8 kg)     General: Well appearing, well nourished in no distress.  Normal mood and affect. Skin: No excessive bruising or rash  Last INR: 2.0 Last PT: 34.6    Last CBC:  Lab Results  Component Value Date   WBC 9.6 12/30/2019   HGB 14.3 12/30/2019   HCT 44.0 12/30/2019   MCV 88 12/30/2019   PLT 273 12/30/2019    Results for orders placed or performed in visit on 11/23/20  CoaguChek XS/INR Waived  Result Value Ref Range   INR 1.4 (H) 0.9 - 1.1   Prothrombin Time 17.2 sec       Assessment:     ICD-10-CM   1. History of mechanical aortic valve replacement  Z95.2 CoaguChek XS/INR Waived    2. Diabetes mellitus without complication (HCC)  E11.9 dapagliflozin propanediol (FARXIGA) 10 MG TABS tablet    3. Essential hypertension  I10 hydrochlorothiazide (HYDRODIURIL) 25 MG tablet    benazepril (LOTENSIN) 40 MG tablet    4. Hypertension associated with type 2 diabetes mellitus (HCC)  E11.59 carvedilol (COREG) 25 MG tablet   I15.2       Plan:   Discussed current plan face-to-face with patient. For coumadin dosing, elected to take 7.5 MG Coumadin 4 days a week  and 8 MG 3 days a week.  Return in two weeks for INR recheck and chronic disease visit.

## 2020-11-23 NOTE — Patient Instructions (Signed)
Take 7.5 MG Coumadin 4 days a week and 8 MG 3 days a week.  Vitamin K Foods and Warfarin Warfarin is a blood thinner (anticoagulant). Anticoagulant medicines help prevent the formation of blood clots. Warfarin works by blocking the activity of vitamin K, which promotes normal bloodclotting. When you take warfarin, problems can occur from suddenly increasing or decreasing the amount of vitamin K that you eat from one day to the next. These problems can occur due to varying levels of warfarin in your blood. Problems may include: Blood clots. Bleeding. What are tips for eating the right amount of vitamin K? To avoid problems when taking warfarin: Eat a balanced diet that includes: Fresh fruits and vegetables. Whole grains. Low-fat dairy products. Lean proteins, such as fish, eggs, and lean cuts of meat. Keep your intake of vitamin K consistent from day to day. To do this: Avoid eating large amounts of vitamin K one day and low amounts of vitamin K the next day. If you take a multivitamin that contains vitamin K, be sure to take it every day. Know which foods contain vitamin K. Read food labels. Use the lists below to understand serving sizes and the amount of vitamin K in one serving. Avoid major changes in your diet. If you are going to change your diet, talk with your health care provider before making changes. Work with a Dealer (dietitian) to develop a meal plan that works best for you. What foods are high in vitamin K? Foods that are high in vitamin K contain more than 100 mcg (micrograms) per serving. These include: Broccoli (cooked from fresh) -  cup (78 g) has 110 mcg. Brussels sprouts (cooked from fresh) -  cup (78 g) has 109 mcg. Greens, beet (cooked from fresh) -  cup (72 g) has 350 mcg. Greens, collard (cooked from fresh) -  cup (66 g) has 263 mcg. Greens, turnip (cooked from fresh) -  cup (72 g) has 265 mcg. Green onions or scallions -  cup (50 g) has 105  mcg. Kale (cooked from fresh) -  cup (68 g) has 536 mcg. Parsley (raw) - 10 sprigs (10 g) has 164 mcg. Spinach (cooked from fresh) -  cup (90 g) has 444 mcg. Swiss chard (cooked from fresh) -  cup (88 g) has 287 mcg. What foods have a moderate amount of vitamin K? Foods that have a moderate amount of vitamin K contain 25-100 mcg per serving. These include: Asparagus (cooked from fresh) - 4 spears (60 g) have 30 mcg. Black-eyed peas (dried) -  cup (85 g) has 32 mcg. Cabbage (cooked from fresh) -  cup (78 g) has 84 mcg. Cabbage (raw) -  cup (35 g) has 26 mcg. Kiwi fruit - 1 medium (69 g) has 27 mcg. Lettuce (raw) - 1 cup (36 g) has 45 mcg. Okra (cooked from fresh) -  cup (80 g) has 32 mcg. Prunes (dried) - 5 prunes (47 g) have 25 mcg. Watercress (raw) - 1 cup (34 g) has 85 mcg. What foods are low in vitamin K? Foods low in vitamin K contain less than 25 mcg per serving. These include: Artichoke - 1 medium (128 g) has 18 mcg. Avocado - 1 oz (21 g) has 6 mcg. Blueberries -  cup (73 g) has 14 mcg. Carrots (cooked from fresh) -  cup (78 g) has 11 mcg. Cauliflower (raw) -  cup (54 g) has 8 mcg. Cucumber with peel (raw) -  cup (52 g) has  9 mcg. Grapes -  cup (76 g) has 12 mcg. Mango - 1 medium (207 g) has 9 mcg. Mixed nuts - 1 cup (142 g) has 17 mcg. Pear - 1 medium (178 g) has 8 mcg. Peas (cooked from fresh) -  cup (80 g) has 20 mcg. Pickled cucumber - 1 spear (65 g) has 11 mcg. Sauerkraut (canned) -  cup (118 g) has 16 mcg. Soybeans (cooked from fresh) -  cup (86 g) has 16 mcg. Tomato (raw) - 1 medium (123 g) has 10 mcg. Tomato sauce (raw) -  cup (123 g) has 17 mcg. What foods do not have vitamin K? If a food contains less than 5 mcg per serving, it is considered to have no vitamin K. These foods include: Bread and cereal products. Cheese. Eggs. Fish and shellfish. Meat and poultry. Milk and dairy products. Seeds, such as sunflower or pumpkin seeds. The items  listed above may not be a complete list of foods that have high, moderate, and low amounts of vitamin K or do not have vitamin K. Actual amounts of vitamin K may differ depending on processing. Contact a dietitian for more information. Summary Warfarin is an anticoagulant that prevents blood clots by blocking the activity of vitamin K. It is important to monitor the content of vitamin K in your foods and to be consistent with the amount of vitamin K you take in each day. Avoid major changes in your diet. If you are going to change your diet, talk with your health care provider before making changes. This information is not intended to replace advice given to you by your health care provider. Make sure you discuss any questions you have with your healthcare provider. Document Revised: 05/13/2020 Document Reviewed: 02/12/2019 Elsevier Patient Education  2022 [employer].

## 2020-11-23 NOTE — Telephone Encounter (Signed)
   Notes to clinic:  The original prescription was discontinued on 02/19/2020 by Marjie Skiff, NP for the following reason: Discontinued by provider. Renewing this prescription may not be appropriate   Requested Prescriptions  Pending Prescriptions Disp Refills   DULoxetine (CYMBALTA) 30 MG capsule [Pharmacy Med Name: DULOXETINE HCL DR 30 MG CAP] 90 capsule 3    Sig: TAKE 1 CAPSULE BY MOUTH EVERY DAY      Psychiatry: Antidepressants - SNRI Failed - 11/23/2020  1:11 PM      Failed - Last BP in normal range    BP Readings from Last 1 Encounters:  11/06/20 (!) 160/90          Passed - Valid encounter within last 6 months    Recent Outpatient Visits           2 weeks ago Type 2 diabetes, uncontrolled, with neuropathy (HCC)   [facility] McElwee, Lauren A, NP   1 month ago Type 2 diabetes, uncontrolled, with neuropathy (HCC)   [facility] [place], Jolene T, NP   2 months ago History of mechanical aortic valve replacement   [facility] [place], [place] T, NP   3 months ago Morbid obesity (HCC)   [facility] [place], [place] T, NP   3 months ago History of mechanical aortic valve replacement   [facility] [place], [name], NP       Future Appointments             Today Marjie Skiff, NP [facility], [facility]

## 2020-11-23 NOTE — Telephone Encounter (Signed)
Pt has apt on 11/23/2020

## 2020-12-02 ENCOUNTER — Other Ambulatory Visit: Payer: Self-pay | Admitting: Nurse Practitioner

## 2020-12-02 NOTE — Telephone Encounter (Signed)
Attempted to contact patient and notify him that per [facility] he would have to the original prescribing provider to refill the medication. Unable to left message as patient voicemail isn't set up.

## 2020-12-02 NOTE — Telephone Encounter (Signed)
  Notes to clinic:  medication filled by a different provider  Review for refill    Requested Prescriptions  Pending Prescriptions Disp Refills   Insulin NPH, Human,, Isophane, (NOVOLIN N FLEXPEN) 100 UNIT/ML Kiwkpen 105 mL 3    Sig: Inject 100 Units into the skin every morning. And pen needles 1/day      Endocrinology:  Diabetes - Insulins Failed - 12/02/2020  8:42 AM      Failed - HBA1C is between 0 and 7.9 and within 180 days    Hemoglobin A1C  Date Value Ref Range Status  11/06/2020 10.9 (A) 4.0 - 5.6 % Final  12/16/2015 10.4  Final   HB A1C (BAYER DCA - WAIVED)  Date Value Ref Range Status  10/19/2020 10.3 (H) <7.0 % Final    Comment:                                          Diabetic Adult            <7.0                                       Healthy Adult        4.3 - 5.7                                                           (DCCT/NGSP) American Diabetes Association's Summary of Glycemic Recommendations for Adults with Diabetes: Hemoglobin A1c <7.0%. More stringent glycemic goals (A1c <6.0%) may further reduce complications at the cost of increased risk of hypoglycemia.           Passed - Valid encounter within last 6 months    Recent Outpatient Visits           1 week ago Type 2 diabetes, uncontrolled, with neuropathy (HCC)   [facility] [place], Jolene T, NP   3 weeks ago Type 2 diabetes, uncontrolled, with neuropathy (HCC)   [facility] McElwee, Lauren A, NP   1 month ago Type 2 diabetes, uncontrolled, with neuropathy (HCC)   [facility] [place], Jolene T, NP   2 months ago History of mechanical aortic valve replacement   [facility] [place], [place] T, NP   3 months ago Morbid obesity (HCC)   [facility] [place], [name], NP       Future Appointments             In 5 days McElwee, Jake Church, NP [facility], [facility]

## 2020-12-02 NOTE — Telephone Encounter (Signed)
Pt is scheduled 8/1  °

## 2020-12-02 NOTE — Telephone Encounter (Signed)
Medication Refill - Medication: the needles for Insulin NPH, Human,, Isophane, (NOVOLIN N FLEXPEN) 100 UNIT/ML Kiwkpen   Has the patient contacted their pharmacy? No.  (Agent: If no, request that the patient contact the pharmacy for the refill.) Caller states patient is out and would like request expedited as soon as possible. Caller states she will contact the pharmacy next time    Preferred Pharmacy (with phone number or street name):  [id number] - [place], [place] - [address] Phone:  [phone]  Fax:  [phone]      Agent: Please be advised that RX refills may take up to 3 business days. We ask that you follow-up with your pharmacy.

## 2020-12-07 ENCOUNTER — Other Ambulatory Visit: Payer: Self-pay

## 2020-12-07 ENCOUNTER — Encounter: Payer: Self-pay | Admitting: Nurse Practitioner

## 2020-12-07 ENCOUNTER — Ambulatory Visit (INDEPENDENT_AMBULATORY_CARE_PROVIDER_SITE_OTHER): Payer: 59 | Admitting: Nurse Practitioner

## 2020-12-07 VITALS — BP 125/80 | HR 85 | Wt 257.4 lb

## 2020-12-07 DIAGNOSIS — Z952 Presence of prosthetic heart valve: Secondary | ICD-10-CM | POA: Diagnosis not present

## 2020-12-07 MED ORDER — INSULIN PEN NEEDLE 32G X 4 MM MISC: 1 refills | Status: DC

## 2020-12-07 NOTE — Patient Instructions (Signed)
Start alternating coumadin dose 9mg  one day then 7.5 mg the next

## 2020-12-07 NOTE — Progress Notes (Signed)
   BP 125/80   Pulse 85   Wt 257 lb 6.4 oz (116.8 kg)   SpO2 96%   BMI 38.01 kg/m    Subjective:    Patient ID: Rodney Chary [name], male    DOB: [birth year], 60 y.o.   MRN: [id number]  CC: Coumadin management  HPI: This patient is a 60 y.o. male who presents for coumadin management. The expected duration of coumadin treatment is lifelong The reason for anticoagulation is  mechanical heart valve.  Present Coumadin dose: Goal: 2.5-3.5 The patient does not have an active anticoagulation episode. Excessive bruising: no Nose bleeding: no Rectal bleeding: no Prolonged menstrual cycles: N/A Eating diet with consistent amounts of foods containing Vitamin K:yes Any recent antibiotic use? no  Relevant past medical, surgical, family and social history reviewed and updated as indicated. Interim medical history since our last visit reviewed. Allergies and medications reviewed and updated.  ROS: Per HPI unless specifically indicated above     Objective:    BP 125/80   Pulse 85   Wt 257 lb 6.4 oz (116.8 kg)   SpO2 96%   BMI 38.01 kg/m   Wt Readings from Last 3 Encounters:  12/07/20 257 lb 6.4 oz (116.8 kg)  11/23/20 259 lb 12.8 oz (117.8 kg)  11/06/20 260 lb 3.2 oz (118 kg)     General: Well appearing, well nourished in no distress.  Normal mood and affect. Skin: No excessive bruising or rash  Last INR:     Last CBC:  Lab Results  Component Value Date   WBC 9.6 12/30/2019   HGB 14.3 12/30/2019   HCT 44.0 12/30/2019   MCV 88 12/30/2019   PLT 273 12/30/2019    Results for orders placed or performed in visit on 11/23/20  CoaguChek XS/INR Waived  Result Value Ref Range   INR 1.4 (H) 0.9 - 1.1   Prothrombin Time 17.2 sec       Assessment:     ICD-10-CM   1. History of mechanical aortic valve replacement  Z95.2 CoaguChek XS/INR Waived (STAT)      Plan:   Discussed current plan face-to-face with patient. For coumadin dosing, elected to continue current dose,  however make sure he is alternating days of 9mg  and 7.5mg . For the last 2 weeks he has been taking 9mg  of coumadin on Monday-Thursday and 7.5mg  of coumadin on Friday-Sunday. Will plan to recheck INR in 2 weeks.

## 2020-12-07 NOTE — Telephone Encounter (Addendum)
Spoke with patient to let him that, Jolene recommends he reach out to Dr.Ellison's office as he was the doctor was the one that initially prescribed it. Patient verbalized understanding and will be seen today by Rodman Pickle, NP 12/07/20 at 4pm. Patient verbalized understanding and has no further questions.

## 2020-12-08 LAB — COAGUCHEK XS/INR WAIVED
INR: 2 — ABNORMAL HIGH (ref 0.9–1.1)
Prothrombin Time: 24.3 s

## 2020-12-21 ENCOUNTER — Other Ambulatory Visit: Payer: Self-pay

## 2020-12-21 ENCOUNTER — Ambulatory Visit (INDEPENDENT_AMBULATORY_CARE_PROVIDER_SITE_OTHER): Payer: 59 | Admitting: Nurse Practitioner

## 2020-12-21 ENCOUNTER — Encounter: Payer: Self-pay | Admitting: Nurse Practitioner

## 2020-12-21 VITALS — BP 129/85 | HR 86 | Temp 98.2°F | Wt 259.4 lb

## 2020-12-21 DIAGNOSIS — Z952 Presence of prosthetic heart valve: Secondary | ICD-10-CM | POA: Diagnosis not present

## 2020-12-21 DIAGNOSIS — I482 Chronic atrial fibrillation, unspecified: Secondary | ICD-10-CM | POA: Diagnosis not present

## 2020-12-21 NOTE — Assessment & Plan Note (Signed)
Chronic, ongoing with Coumadin use.  INR today 2.8, at goal 2.5 to 3.5. Elected to continue Coumadin 9 MG daily.  Return in 4 weeks for INR check.  Return sooner to office if any bleeding or concerns.

## 2020-12-21 NOTE — Patient Instructions (Signed)
Vitamin K Foods and Warfarin Warfarin is a blood thinner (anticoagulant). Anticoagulant medicines help prevent the formation of blood clots. Warfarin works by blocking the activity of vitamin K, which promotes normal bloodclotting. When you take warfarin, problems can occur from suddenly increasing or decreasing the amount of vitamin K that you eat from one day to the next. These problems can occur due to varying levels of warfarin in your blood. Problems may include: Blood clots. Bleeding. What are tips for eating the right amount of vitamin K? To avoid problems when taking warfarin: Eat a balanced diet that includes: Fresh fruits and vegetables. Whole grains. Low-fat dairy products. Lean proteins, such as fish, eggs, and lean cuts of meat. Keep your intake of vitamin K consistent from day to day. To do this: Avoid eating large amounts of vitamin K one day and low amounts of vitamin K the next day. If you take a multivitamin that contains vitamin K, be sure to take it every day. Know which foods contain vitamin K. Read food labels. Use the lists below to understand serving sizes and the amount of vitamin K in one serving. Avoid major changes in your diet. If you are going to change your diet, talk with your health care provider before making changes. Work with a nutrition specialist (dietitian) to develop a meal plan that works best for you. What foods are high in vitamin K? Foods that are high in vitamin K contain more than 100 mcg (micrograms) per serving. These include: Broccoli (cooked from fresh) -  cup (78 g) has 110 mcg. Brussels sprouts (cooked from fresh) -  cup (78 g) has 109 mcg. Greens, beet (cooked from fresh) -  cup (72 g) has 350 mcg. Greens, collard (cooked from fresh) -  cup (66 g) has 263 mcg. Greens, turnip (cooked from fresh) -  cup (72 g) has 265 mcg. Green onions or scallions -  cup (50 g) has 105 mcg. Kale (cooked from fresh) -  cup (68 g) has 536  mcg. Parsley (raw) - 10 sprigs (10 g) has 164 mcg. Spinach (cooked from fresh) -  cup (90 g) has 444 mcg. Swiss chard (cooked from fresh) -  cup (88 g) has 287 mcg. What foods have a moderate amount of vitamin K? Foods that have a moderate amount of vitamin K contain 25-100 mcg per serving. These include: Asparagus (cooked from fresh) - 4 spears (60 g) have 30 mcg. Black-eyed peas (dried) -  cup (85 g) has 32 mcg. Cabbage (cooked from fresh) -  cup (78 g) has 84 mcg. Cabbage (raw) -  cup (35 g) has 26 mcg. Kiwi fruit - 1 medium (69 g) has 27 mcg. Lettuce (raw) - 1 cup (36 g) has 45 mcg. Okra (cooked from fresh) -  cup (80 g) has 32 mcg. Prunes (dried) - 5 prunes (47 g) have 25 mcg. Watercress (raw) - 1 cup (34 g) has 85 mcg. What foods are low in vitamin K? Foods low in vitamin K contain less than 25 mcg per serving. These include: Artichoke - 1 medium (128 g) has 18 mcg. Avocado - 1 oz (21 g) has 6 mcg. Blueberries -  cup (73 g) has 14 mcg. Carrots (cooked from fresh) -  cup (78 g) has 11 mcg. Cauliflower (raw) -  cup (54 g) has 8 mcg. Cucumber with peel (raw) -  cup (52 g) has 9 mcg. Grapes -  cup (76 g) has 12 mcg. Mango - 1 medium (207   g) has 9 mcg. Mixed nuts - 1 cup (142 g) has 17 mcg. Pear - 1 medium (178 g) has 8 mcg. Peas (cooked from fresh) -  cup (80 g) has 20 mcg. Pickled cucumber - 1 spear (65 g) has 11 mcg. Sauerkraut (canned) -  cup (118 g) has 16 mcg. Soybeans (cooked from fresh) -  cup (86 g) has 16 mcg. Tomato (raw) - 1 medium (123 g) has 10 mcg. Tomato sauce (raw) -  cup (123 g) has 17 mcg. What foods do not have vitamin K? If a food contains less than 5 mcg per serving, it is considered to have no vitamin K. These foods include: Bread and cereal products. Cheese. Eggs. Fish and shellfish. Meat and poultry. Milk and dairy products. Seeds, such as sunflower or pumpkin seeds. The items listed above may not be a complete list of foods that have  high, moderate, and low amounts of vitamin K or do not have vitamin K. Actual amounts of vitamin K may differ depending on processing. Contact a dietitian for more information. Summary Warfarin is an anticoagulant that prevents blood clots by blocking the activity of vitamin K. It is important to monitor the content of vitamin K in your foods and to be consistent with the amount of vitamin K you take in each day. Avoid major changes in your diet. If you are going to change your diet, talk with your health care provider before making changes. This information is not intended to replace advice given to you by your health care provider. Make sure you discuss any questions you have with your healthcare provider. Document Revised: 05/13/2020 Document Reviewed: 02/12/2019 Elsevier Patient Education  2022 Elsevier Inc.  

## 2020-12-21 NOTE — Assessment & Plan Note (Signed)
Chronic, ongoing with Coumadin use.  INR today 2.8, at goal 2.5 to 3.5. Elected to continue Coumadin 9 MG daily.  Return in 4 weeks for INR check.  Return sooner to office if any bleeding or concerns. 

## 2020-12-21 NOTE — Progress Notes (Signed)
   BP 129/85   Pulse 86   Temp 98.2 F (36.8 C)   Wt 259 lb 6 oz (117.7 kg)   SpO2 95%   BMI 38.30 kg/m    Subjective:    Patient ID: Rodney Chary [name], male    DOB: 1961-02-14, 60 y.o.   MRN: [id number]  CC: Coumadin management  HPI: This patient is a 60 y.o. male who presents for coumadin management. The expected duration of coumadin treatment is lifelong The reason for anticoagulation is  mechanical heart valve.  Present Coumadin dose: Coumadin 9 MG daily Goal: 2.5-3.5 The patient does not have an active anticoagulation episode. Excessive bruising: no Nose bleeding: no Rectal bleeding: no Prolonged menstrual cycles: N/A Eating diet with consistent amounts of foods containing Vitamin K:yes Any recent antibiotic use? no  Relevant past medical, surgical, family and social history reviewed and updated as indicated. Interim medical history since our last visit reviewed. Allergies and medications reviewed and updated.  ROS: Per HPI unless specifically indicated above     Objective:    BP 129/85   Pulse 86   Temp 98.2 F (36.8 C)   Wt 259 lb 6 oz (117.7 kg)   SpO2 95%   BMI 38.30 kg/m   Wt Readings from Last 3 Encounters:  12/21/20 259 lb 6 oz (117.7 kg)  12/07/20 257 lb 6.4 oz (116.8 kg)  11/23/20 259 lb 12.8 oz (117.8 kg)     General: Well appearing, well nourished in no distress.  Normal mood and affect. Skin: No excessive bruising or rash  Last INR: 2.0  Last CBC:  Lab Results  Component Value Date   WBC 9.6 12/30/2019   HGB 14.3 12/30/2019   HCT 44.0 12/30/2019   MCV 88 12/30/2019   PLT 273 12/30/2019    Results for orders placed or performed in visit on 12/07/20  CoaguChek XS/INR Waived (STAT)  Result Value Ref Range   INR 2.0 (H) 0.9 - 1.1   Prothrombin Time 24.3 sec       Assessment:     ICD-10-CM   1. Chronic atrial fibrillation (HCC)  I48.20     2. History of mechanical aortic valve replacement  Z95.2 CoaguChek XS/INR Waived     CoaguChek XS/INR Waived (STAT)      Plan:   Discussed current plan face-to-face with patient. For coumadin dosing, elected to continue current dose. Will plan to recheck INR in 1 month.

## 2020-12-22 LAB — COAGUCHEK XS/INR WAIVED
INR: 2.8 — ABNORMAL HIGH (ref 0.9–1.1)
Prothrombin Time: 33.1 s

## 2020-12-27 ENCOUNTER — Other Ambulatory Visit: Payer: Self-pay | Admitting: Nurse Practitioner

## 2020-12-27 NOTE — Telephone Encounter (Signed)
Requested medication (s) are due for refill today: yes  Requested medication (s) are on the active medication list: yes  Last refill:  10/19/20 #60  Future visit scheduled: yes  Notes to clinic:  med not delegated to NT to RF   Requested Prescriptions  Pending Prescriptions Disp Refills   methocarbamol (ROBAXIN) 500 MG tablet [Pharmacy Med Name: METHOCARBAMOL 500 MG TABLET] 60 tablet 1    Sig: TAKE 1 TABLET BY MOUTH EVERY 6 HOURS AS NEEDED FOR MUSCLE SPASMS.     Not Delegated - Analgesics:  Muscle Relaxants Failed - 12/27/2020 10:42 AM      Failed - This refill cannot be delegated      Passed - Valid encounter within last 6 months    Recent Outpatient Visits           6 days ago Chronic atrial fibrillation (HCC)   [facility] [place], Jolene T, NP   2 weeks ago History of mechanical aortic valve replacement   [facility] McElwee, Lauren A, NP   1 month ago Type 2 diabetes, uncontrolled, with neuropathy (HCC)   [facility] [place], Jolene T, NP   1 month ago Type 2 diabetes, uncontrolled, with neuropathy (HCC)   [facility] McElwee, Lauren A, NP   2 months ago Type 2 diabetes, uncontrolled, with neuropathy (HCC)   [facility] [place], [name], NP       Future Appointments             In 3 weeks [place], [name], NP [facility]

## 2021-01-18 ENCOUNTER — Other Ambulatory Visit: Payer: Self-pay

## 2021-01-18 ENCOUNTER — Ambulatory Visit (INDEPENDENT_AMBULATORY_CARE_PROVIDER_SITE_OTHER): Payer: 59 | Admitting: Nurse Practitioner

## 2021-01-18 ENCOUNTER — Encounter: Payer: Self-pay | Admitting: Nurse Practitioner

## 2021-01-18 VITALS — BP 132/86 | HR 84 | Temp 98.4°F | Wt 253.4 lb

## 2021-01-18 DIAGNOSIS — I482 Chronic atrial fibrillation, unspecified: Secondary | ICD-10-CM | POA: Diagnosis not present

## 2021-01-18 DIAGNOSIS — Z952 Presence of prosthetic heart valve: Secondary | ICD-10-CM | POA: Diagnosis not present

## 2021-01-18 NOTE — Patient Instructions (Signed)

## 2021-01-18 NOTE — Progress Notes (Signed)
   BP 132/86 (BP Location: Left Arm)   Pulse 84   Temp 98.4 F (36.9 C) (Oral)   Wt 253 lb 6.4 oz (114.9 kg)   SpO2 95%   BMI 37.42 kg/m    Subjective:    Patient ID: Rodney Chary [name], male    DOB: 05-11-1960, 60 y.o.   MRN: [id number]  CC: Coumadin management  HPI: This patient is a 60 y.o. male who presents for coumadin management. The expected duration of coumadin treatment is lifelong The reason for anticoagulation is  mechanical heart valve.  Recently saw Dr. Lady Gary with cardiology on 01/04/21 -- no changes made.  Present Coumadin dose: Coumadin 9 MG daily Goal: 2.5-3.5 The patient does not have an active anticoagulation episode. Excessive bruising: no Nose bleeding: no Rectal bleeding: no Prolonged menstrual cycles: N/A Eating diet with consistent amounts of foods containing Vitamin K:yes Any recent antibiotic use? no  Relevant past medical, surgical, family and social history reviewed and updated as indicated. Interim medical history since our last visit reviewed. Allergies and medications reviewed and updated.  ROS: Per HPI unless specifically indicated above     Objective:    BP 132/86 (BP Location: Left Arm)   Pulse 84   Temp 98.4 F (36.9 C) (Oral)   Wt 253 lb 6.4 oz (114.9 kg)   SpO2 95%   BMI 37.42 kg/m   Wt Readings from Last 3 Encounters:  01/18/21 253 lb 6.4 oz (114.9 kg)  12/21/20 259 lb 6 oz (117.7 kg)  12/07/20 257 lb 6.4 oz (116.8 kg)     General: Well appearing, well nourished in no distress.  Normal mood and affect. Skin: No excessive bruising or rash  Last INR: 2.8  Last CBC:  Lab Results  Component Value Date   WBC 9.6 12/30/2019   HGB 14.3 12/30/2019   HCT 44.0 12/30/2019   MCV 88 12/30/2019   PLT 273 12/30/2019    Results for orders placed or performed in visit on 12/21/20  CoaguChek XS/INR Waived (STAT)  Result Value Ref Range   INR 2.8 (H) 0.9 - 1.1   Prothrombin Time 33.1 sec       Assessment:     ICD-10-CM   1.  Chronic atrial fibrillation (HCC)  I48.20 CoaguChek XS/INR Waived    CBC with Differential/Platelet    2. History of mechanical aortic valve replacement  Z95.2 CoaguChek XS/INR Waived    CBC with Differential/Platelet      Plan:   Discussed current plan face-to-face with patient. For coumadin dosing, elected to continue current dose. Will plan to recheck INR in 1 month.

## 2021-01-18 NOTE — Assessment & Plan Note (Signed)
Chronic, ongoing with Coumadin use.  INR today 2.7, at goal 2.5 to 3.5. Elected to continue Coumadin 9 MG daily.  Return in 4 weeks for INR check.  Return sooner to office if any bleeding or concerns.

## 2021-01-18 NOTE — Assessment & Plan Note (Signed)
Chronic, ongoing with Coumadin use.  INR today 2.7, at goal 2.5 to 3.5. Elected to continue Coumadin 9 MG daily.  Return in 4 weeks for INR check.  Return sooner to office if any bleeding or concerns.  CBC today.

## 2021-01-19 LAB — CBC WITH DIFFERENTIAL/PLATELET
Basophils Absolute: 0.1 10*3/uL (ref 0.0–0.2)
Basos: 1 %
EOS (ABSOLUTE): 0.4 10*3/uL (ref 0.0–0.4)
Eos: 3 %
Hematocrit: 43.9 % (ref 37.5–51.0)
Hemoglobin: 14.3 g/dL (ref 13.0–17.7)
Immature Grans (Abs): 0.1 10*3/uL (ref 0.0–0.1)
Immature Granulocytes: 1 %
Lymphocytes Absolute: 3.1 10*3/uL (ref 0.7–3.1)
Lymphs: 26 %
MCH: 27.4 pg (ref 26.6–33.0)
MCHC: 32.6 g/dL (ref 31.5–35.7)
MCV: 84 fL (ref 79–97)
Monocytes Absolute: 0.9 10*3/uL (ref 0.1–0.9)
Monocytes: 8 %
Neutrophils Absolute: 7.5 10*3/uL — ABNORMAL HIGH (ref 1.4–7.0)
Neutrophils: 61 %
Platelets: 223 10*3/uL (ref 150–450)
RBC: 5.22 x10E6/uL (ref 4.14–5.80)
RDW: 13.6 % (ref 11.6–15.4)
WBC: 12.1 10*3/uL — ABNORMAL HIGH (ref 3.4–10.8)

## 2021-01-19 LAB — COAGUCHEK XS/INR WAIVED
INR: 2.7 — ABNORMAL HIGH (ref 0.9–1.1)
Prothrombin Time: 32.3 s

## 2021-01-19 NOTE — Progress Notes (Signed)
Contacted via MyChart   Good morning Damiano, your CBC has returned.  There is mild elevation in white blood cell count and neutrophils which we at times see with bacterial infections.  I would like to recheck this at your next visit in 4 weeks. Have you been sick recently?  Any questions? Keep being awesome!!  Thank you for allowing me to participate in your care.  I appreciate you. Kindest regards, Aakash Hollomon

## 2021-01-26 ENCOUNTER — Ambulatory Visit (INDEPENDENT_AMBULATORY_CARE_PROVIDER_SITE_OTHER): Payer: 59 | Admitting: Endocrinology

## 2021-01-26 ENCOUNTER — Other Ambulatory Visit: Payer: Self-pay

## 2021-01-26 VITALS — BP 130/80 | HR 78 | Ht 69.0 in | Wt 250.6 lb

## 2021-01-26 DIAGNOSIS — R809 Proteinuria, unspecified: Secondary | ICD-10-CM | POA: Diagnosis not present

## 2021-01-26 DIAGNOSIS — E1129 Type 2 diabetes mellitus with other diabetic kidney complication: Secondary | ICD-10-CM | POA: Diagnosis not present

## 2021-01-26 LAB — POCT GLYCOSYLATED HEMOGLOBIN (HGB A1C): Hemoglobin A1C: 7.8 % — AB (ref 4.0–5.6)

## 2021-01-26 MED ORDER — NOVOLIN N FLEXPEN 100 UNIT/ML ~~LOC~~ SUPN: 50.0000 [IU] | PEN_INJECTOR | SUBCUTANEOUS | 3 refills | Status: DC

## 2021-01-26 MED ORDER — SEMAGLUTIDE (1 MG/DOSE) 4 MG/3ML ~~LOC~~ SOPN: 1.0000 mg | PEN_INJECTOR | SUBCUTANEOUS | 3 refills | Status: DC

## 2021-01-26 NOTE — Progress Notes (Signed)
Subjective:    Patient ID: Rodney Chary [name], male    DOB: 02-Jun-1960, 60 y.o.   MRN: [id number]  HPI Pt returns for f/u of diabetes mellitus: DM type: Insulin-requiring type 2 Dx'ed: 2007 Complications: CRI and PN.   Therapy: insulin since 2016, Ozempic, and 2 oral meds.  DKA: never Severe hypoglycemia: never Pancreatitis: never Pancreatic imaging: never SDOH: depression complicates the rx of DM Other: he takes qd insulin, after poor results with multiple daily injections Interval history: no cbg record, but states cbg's vary from 66-154.  There is no trend throughout the day.  Pt says he never misses meds.  He reduced insulin to 60 units qam, due to hypoglycemia.   Past Medical History:  Diagnosis Date   Allergy    Diabetes mellitus without complication (HCC)    Hyperlipidemia    Hypertension     Past Surgical History:  Procedure Laterality Date   CARDIAC VALVE REPLACEMENT     COLONOSCOPY WITH PROPOFOL N/A 08/19/2019   Procedure: COLONOSCOPY WITH PROPOFOL;  Surgeon: [name], MD;  Location: [facility] ENDOSCOPY;  Service: Gastroenterology;  Laterality: N/A;    Social History   Socioeconomic History   Marital status: Married    Spouse name: Not on file   Number of children: Not on file   Years of education: Not on file   Highest education level: Not on file  Occupational History   Not on file  Tobacco Use   Smoking status: Never   Smokeless tobacco: Never  Vaping Use   Vaping Use: Never used  Substance and Sexual Activity   Alcohol use: No   Drug use: No   Sexual activity: Not Currently  Other Topics Concern   Not on file  Social History Narrative   Not on file   Social Determinants of Health   Financial Resource Strain: Not on file  Food Insecurity: Not on file  Transportation Needs: Not on file  Physical Activity: Not on file  Stress: Not on file  Social Connections: Not on file  Intimate Partner Violence: Not on file    Current Outpatient  Medications on File Prior to Visit  Medication Sig Dispense Refill   atorvastatin (LIPITOR) 80 MG tablet Take 1 tablet (80 mg total) by mouth daily. STOP 40 MG ATORVASTATIN AND START THIS DOSE. 90 tablet 4   benazepril (LOTENSIN) 40 MG tablet Take 1 tablet (40 mg total) by mouth daily. 90 tablet 4   carvedilol (COREG) 25 MG tablet Take 1 tablet (25 mg total) by mouth 2 (two) times daily with a meal. 180 tablet 4   Continuous Blood Gluc Sensor (FREESTYLE LIBRE 14 DAY SENSOR) MISC To check blood sugar four times a day. 2 each 3   dapagliflozin propanediol (FARXIGA) 10 MG TABS tablet Take 1 tablet (10 mg total) by mouth daily. 90 tablet 4   fenofibrate (TRICOR) 145 MG tablet TAKE 1 TABLET BY MOUTH EVERY DAY 90 tablet 3   gabapentin (NEURONTIN) 300 MG capsule Take 900 mg (three tablets) by mouth in the morning, take 900 MG (3 tablets) by mouth at lunch, and then take 1200 MG (4 tablets) by mouth at night before bed. 900 capsule 4   Glucose Blood (BLOOD GLUCOSE TEST STRIPS) STRP 1 strip by In Vitro route daily. 100 each 12   hydrochlorothiazide (HYDRODIURIL) 25 MG tablet Take 1 tablet (25 mg total) by mouth daily. 90 tablet 4   Insulin Pen Needle 32G X 4 MM MISC Use with  insulin pen dosing 100 each 1   methocarbamol (ROBAXIN) 500 MG tablet TAKE 1 TABLET BY MOUTH EVERY 6 HOURS AS NEEDED FOR MUSCLE SPASMS. 60 tablet 1   Semaglutide,0.25 or 0.5MG /DOS, (OZEMPIC, 0.25 OR 0.5 MG/DOSE,) 2 MG/1.5ML SOPN Inject 0.5 mg into the skin once a week. 4.5 mL 3   warfarin (COUMADIN) 2.5 MG tablet Take 1 tablet (2.5 mg total) by mouth daily. 90 tablet 1   warfarin (COUMADIN) 4 MG tablet Take 1 tablet (4 mg total) by mouth daily. 90 tablet 1   warfarin (COUMADIN) 5 MG tablet Start taking 7 MG on Monday, Wednesday, Friday, Saturday, Sunday and 7.5 MG on Tuesday and Thursday.  Return in one week.  Utilize 1 and a 1/2 5 MG tablets to get your 7.5 MG dosing. 60 tablet 3   No current facility-administered medications on file  prior to visit.    No Known Allergies  Family History  Problem Relation Age of Onset   Diabetes Mother    Heart disease Mother    Hyperlipidemia Mother    Hypertension Mother     BP 130/80 (BP Location: Right Arm, Patient Position: Sitting, Cuff Size: Large)   Pulse 78   Ht 5\' 9"  (1.753 m)   Wt 250 lb 9.6 oz (113.7 kg)   SpO2 95%   BMI 37.01 kg/m   Review of Systems Denies n/v/HB    Objective:   Physical Exam Pulses: dorsalis pedis intact bilat.   MSK: no deformity of the feet CV: no leg edema Skin:  no ulcer on the feet.  normal color and temp on the feet. Neuro: sensation is intact to touch on the feet.     Lab Results  Component Value Date   HGBA1C 7.8 (A) 01/26/2021   Lab Results  Component Value Date   CREATININE 1.94 (H) 10/19/2020   BUN 38 (H) 10/19/2020   NA 133 (L) 10/19/2020   K 4.1 10/19/2020   CL 95 (L) 10/19/2020   CO2 24 10/19/2020      Assessment & Plan:  Insulin-requiring type 2 DM: uncontrolled.  Hypoglycemia, due to insulin: we'll favor GLP rx.   Patient Instructions  check your blood sugar twice a day.  vary the time of day when you check, between before the 3 meals, and at bedtime.  also check if you have symptoms of your blood sugar being too high or too low.  please keep a record of the readings and bring it to your next appointment here (or you can bring the meter itself).  You can write it on any piece of paper.  please call 10/21/2020 sooner if your blood sugar goes below 70, or if most of your readings are over 200. We will need to take this complex situation in stages.   For now, please stop taking the metformin, and:  I have sent a prescription to your pharmacy, to increase the Ozempic, and:   reduce the NPH insulin to 50 units each morning.   On this type of insulin schedule, you should eat meals on a regular schedule.  If a meal is missed or significantly delayed, your blood sugar could go low.  Please continue the same other diabetes  medications.   Please come back for a follow-up appointment in 2 months.

## 2021-01-26 NOTE — Patient Instructions (Addendum)
check your blood sugar twice a day.  vary the time of day when you check, between before the 3 meals, and at bedtime.  also check if you have symptoms of your blood sugar being too high or too low.  please keep a record of the readings and bring it to your next appointment here (or you can bring the meter itself).  You can write it on any piece of paper.  please call us sooner if your blood sugar goes below 70, or if most of your readings are over 200. We will need to take this complex situation in stages.   For now, please stop taking the metformin, and:  I have sent a prescription to your pharmacy, to increase the Ozempic, and:   reduce the NPH insulin to 50 units each morning.   On this type of insulin schedule, you should eat meals on a regular schedule.  If a meal is missed or significantly delayed, your blood sugar could go low.  Please continue the same other diabetes medications.   Please come back for a follow-up appointment in 2 months.

## 2021-01-30 ENCOUNTER — Other Ambulatory Visit: Payer: Self-pay | Admitting: Family Medicine

## 2021-01-30 NOTE — Telephone Encounter (Signed)
Requested medications are due for refill today ?  Requested medications are on the active medication list multiple doses on list, this is not newest  Last refill 11/04/20  Last visit 9/12  Future visit scheduled 10/12  Notes to clinic Not Delegated, dose inconsistent with most recent rx on current med list.

## 2021-02-17 ENCOUNTER — Encounter: Payer: Self-pay | Admitting: Nurse Practitioner

## 2021-02-17 ENCOUNTER — Other Ambulatory Visit: Payer: Self-pay

## 2021-02-17 ENCOUNTER — Ambulatory Visit (INDEPENDENT_AMBULATORY_CARE_PROVIDER_SITE_OTHER): Payer: 59 | Admitting: Nurse Practitioner

## 2021-02-17 VITALS — BP 132/83 | HR 87 | Temp 98.1°F | Wt 254.4 lb

## 2021-02-17 DIAGNOSIS — E1169 Type 2 diabetes mellitus with other specified complication: Secondary | ICD-10-CM | POA: Diagnosis not present

## 2021-02-17 DIAGNOSIS — E1122 Type 2 diabetes mellitus with diabetic chronic kidney disease: Secondary | ICD-10-CM | POA: Diagnosis not present

## 2021-02-17 DIAGNOSIS — N400 Enlarged prostate without lower urinary tract symptoms: Secondary | ICD-10-CM

## 2021-02-17 DIAGNOSIS — Z794 Long term (current) use of insulin: Secondary | ICD-10-CM

## 2021-02-17 DIAGNOSIS — E1159 Type 2 diabetes mellitus with other circulatory complications: Secondary | ICD-10-CM

## 2021-02-17 DIAGNOSIS — N1831 Chronic kidney disease, stage 3a: Secondary | ICD-10-CM

## 2021-02-17 DIAGNOSIS — E1142 Type 2 diabetes mellitus with diabetic polyneuropathy: Secondary | ICD-10-CM | POA: Diagnosis not present

## 2021-02-17 DIAGNOSIS — E785 Hyperlipidemia, unspecified: Secondary | ICD-10-CM

## 2021-02-17 DIAGNOSIS — I152 Hypertension secondary to endocrine disorders: Secondary | ICD-10-CM

## 2021-02-17 DIAGNOSIS — Z952 Presence of prosthetic heart valve: Secondary | ICD-10-CM

## 2021-02-17 DIAGNOSIS — I482 Chronic atrial fibrillation, unspecified: Secondary | ICD-10-CM

## 2021-02-17 DIAGNOSIS — D6869 Other thrombophilia: Secondary | ICD-10-CM

## 2021-02-17 LAB — COAGUCHEK XS/INR WAIVED
INR: 1.8 — ABNORMAL HIGH (ref 0.9–1.1)
Prothrombin Time: 21.3 s

## 2021-02-17 NOTE — Assessment & Plan Note (Signed)
Chronic, ongoing with A1C much improved recent endo visit at 7.8%, praised for this.  Continue NPH at current dosing and continue Ozempic + Farxiga -- NO Metformin due to CKD.  Continue Benazepril for kidney protection.  Focus heavily on diabetic diet and modest weight loss, decreasing carb and sugar intake at home.  Recommend he check BS 3-4 times a day and document for next visit for review.  Will continue to work with CCM team to ensure adherence to medication regimen and diet, which is a major issue.   Discussed at length his A1C goal for stroke prevention being 6.5% or less.  Return in 3 months.

## 2021-02-17 NOTE — Assessment & Plan Note (Signed)
Chronic, ongoing with BP at goal today.  Educated him on diabetes effect on vascular system.  Continue Benazepril, HCTZ, Coreg, and Fenofibrate/Atorvastatin.  Recommend he monitor BP at home at least a few mornings a week + focus on DASH diet.  Discussed with him stroke prevention goal for BP <130/90.  CMP today.  Continue cardiology collaboration.  Return in 3 months.

## 2021-02-17 NOTE — Assessment & Plan Note (Signed)
Chronic, ongoing.  Continue current medication regimen and adjust as needed. Lipid panel today. 

## 2021-02-17 NOTE — Assessment & Plan Note (Signed)
Chronic, ongoing with Coumadin use.  INR today 1.8, not at goal 2.5 to 3.5 due to missing doses. Elected to continue Coumadin 9 MG daily as has picked up medications now.  Return in 4 weeks for INR check.  Return sooner to office if any bleeding or concerns.

## 2021-02-17 NOTE — Assessment & Plan Note (Signed)
Chronic, ongoing with Coumadin use.  INR today 1.8, not at goal 2.5 to 3.5 due to missing some doses. Elected to continue Coumadin 9 MG daily at this time, has new medications picked up.  Return in 4 weeks for INR check.  Return sooner to office if any bleeding or concerns.

## 2021-02-17 NOTE — Assessment & Plan Note (Signed)
Monitor CBC with use of daily Coumadin for mechanical valve and a-fib.  Denies any current bleeding episodes.  Check CBC next visit. 

## 2021-02-17 NOTE — Assessment & Plan Note (Signed)
BMI 37.57 with T2DM, HTN/HLD.  Recommended eating smaller high protein, low fat meals more frequently and exercising 30 mins a day 5 times a week with a goal of 10-15lb weight loss in the next 3 months. Patient voiced their understanding and motivation to adhere to these recommendations.

## 2021-02-17 NOTE — Patient Instructions (Signed)
Diabetes Mellitus and Nutrition, Adult When you have diabetes, or diabetes mellitus, it is very important to have healthy eating habits because your blood sugar (glucose) levels are greatly affected by what you eat and drink. Eating healthy foods in the right amounts, at about the same times every day, can help you:  Control your blood glucose.  Lower your risk of heart disease.  Improve your blood pressure.  Reach or maintain a healthy weight. What can affect my meal plan? Every person with diabetes is different, and each person has different needs for a meal plan. Your health care provider may recommend that you work with a dietitian to make a meal plan that is best for you. Your meal plan may vary depending on factors such as:  The calories you need.  The medicines you take.  Your weight.  Your blood glucose, blood pressure, and cholesterol levels.  Your activity level.  Other health conditions you have, such as heart or kidney disease. How do carbohydrates affect me? Carbohydrates, also called carbs, affect your blood glucose level more than any other type of food. Eating carbs naturally raises the amount of glucose in your blood. Carb counting is a method for keeping track of how many carbs you eat. Counting carbs is important to keep your blood glucose at a healthy level, especially if you use insulin or take certain oral diabetes medicines. It is important to know how many carbs you can safely have in each meal. This is different for every person. Your dietitian can help you calculate how many carbs you should have at each meal and for each snack. How does alcohol affect me? Alcohol can cause a sudden decrease in blood glucose (hypoglycemia), especially if you use insulin or take certain oral diabetes medicines. Hypoglycemia can be a life-threatening condition. Symptoms of hypoglycemia, such as sleepiness, dizziness, and confusion, are similar to symptoms of having too much  alcohol.  Do not drink alcohol if: ? Your health care provider tells you not to drink. ? You are pregnant, may be pregnant, or are planning to become pregnant.  If you drink alcohol: ? Do not drink on an empty stomach. ? Limit how much you use to:  0-1 drink a day for women.  0-2 drinks a day for men. ? Be aware of how much alcohol is in your drink. In the [place] one drink equals one 12 oz bottle of beer (355 mL), one 5 oz glass of wine (148 mL), or one 1 oz glass of hard liquor (44 mL). ? Keep yourself hydrated with water, diet soda, or unsweetened iced tea.  Keep in mind that regular soda, juice, and other mixers may contain a lot of sugar and must be counted as carbs. What are tips for following this plan? Reading food labels  Start by checking the serving size on the "Nutrition Facts" label of packaged foods and drinks. The amount of calories, carbs, fats, and other nutrients listed on the label is based on one serving of the item. Many items contain more than one serving per package.  Check the total grams (g) of carbs in one serving. You can calculate the number of servings of carbs in one serving by dividing the total carbs by 15. For example, if a food has 30 g of total carbs per serving, it would be equal to 2 servings of carbs.  Check the number of grams (g) of saturated fats and trans fats in one serving. Choose foods that have   a low amount or none of these fats.  Check the number of milligrams (mg) of salt (sodium) in one serving. Most people should limit total sodium intake to less than 2,300 mg per day.  Always check the nutrition information of foods labeled as "low-fat" or "nonfat." These foods may be higher in added sugar or refined carbs and should be avoided.  Talk to your dietitian to identify your daily goals for nutrients listed on the label. Shopping  Avoid buying canned, pre-made, or processed foods. These foods tend to be high in fat, sodium, and added  sugar.  Shop around the outside edge of the grocery store. This is where you will most often find fresh fruits and vegetables, bulk grains, fresh meats, and fresh dairy. Cooking  Use low-heat cooking methods, such as baking, instead of high-heat cooking methods like deep frying.  Cook using healthy oils, such as olive, canola, or sunflower oil.  Avoid cooking with butter, cream, or high-fat meats. Meal planning  Eat meals and snacks regularly, preferably at the same times every day. Avoid going long periods of time without eating.  Eat foods that are high in fiber, such as fresh fruits, vegetables, beans, and whole grains. Talk with your dietitian about how many servings of carbs you can eat at each meal.  Eat 4-6 oz (112-168 g) of lean protein each day, such as lean meat, chicken, fish, eggs, or tofu. One ounce (oz) of lean protein is equal to: ? 1 oz (28 g) of meat, chicken, or fish. ? 1 egg. ?  cup (62 g) of tofu.  Eat some foods each day that contain healthy fats, such as avocado, nuts, seeds, and fish.   What foods should I eat? Fruits Berries. Apples. Oranges. Peaches. Apricots. Plums. Grapes. Mango. Papaya. Pomegranate. Kiwi. Cherries. Vegetables Lettuce. Spinach. Leafy greens, including kale, chard, collard greens, and mustard greens. Beets. Cauliflower. Cabbage. Broccoli. Carrots. Green beans. Tomatoes. Peppers. Onions. Cucumbers. Brussels sprouts. Grains Whole grains, such as whole-wheat or whole-grain bread, crackers, tortillas, cereal, and pasta. Unsweetened oatmeal. Quinoa. Brown or wild rice. Meats and other proteins Seafood. Poultry without skin. Lean cuts of poultry and beef. Tofu. Nuts. Seeds. Dairy Low-fat or fat-free dairy products such as milk, yogurt, and cheese. The items listed above may not be a complete list of foods and beverages you can eat. Contact a dietitian for more information. What foods should I avoid? Fruits Fruits canned with  syrup. Vegetables Canned vegetables. Frozen vegetables with butter or cream sauce. Grains Refined white flour and flour products such as bread, pasta, snack foods, and cereals. Avoid all processed foods. Meats and other proteins Fatty cuts of meat. Poultry with skin. Breaded or fried meats. Processed meat. Avoid saturated fats. Dairy Full-fat yogurt, cheese, or milk. Beverages Sweetened drinks, such as soda or iced tea. The items listed above may not be a complete list of foods and beverages you should avoid. Contact a dietitian for more information. Questions to ask a health care provider  Do I need to meet with a diabetes educator?  Do I need to meet with a dietitian?  What number can I call if I have questions?  When are the best times to check my blood glucose? Where to find more information:  American Diabetes Association: diabetes.org  Academy of Nutrition and Dietetics: www.eatright.org  National Institute of Diabetes and Digestive and Kidney Diseases: www.niddk.nih.gov  Association of Diabetes Care and Education Specialists: www.diabeteseducator.org Summary  It is important to have healthy eating   habits because your blood sugar (glucose) levels are greatly affected by what you eat and drink.  A healthy meal plan will help you control your blood glucose and maintain a healthy lifestyle.  Your health care provider may recommend that you work with a dietitian to make a meal plan that is best for you.  Keep in mind that carbohydrates (carbs) and alcohol have immediate effects on your blood glucose levels. It is important to count carbs and to use alcohol carefully. This information is not intended to replace advice given to you by your health care provider. Make sure you discuss any questions you have with your health care provider. Document Revised: 04/02/2019 Document Reviewed: 04/02/2019 Elsevier Patient Education  2021 Elsevier Inc.  

## 2021-02-17 NOTE — Assessment & Plan Note (Signed)
Chronic, ongoing with T2DM.  Continue Benazepril for kidney protection.  Metformin recently stopped and discussed with patient dependent on labs may need to renally dose Gabapentin.  Recommend heavy focus on controlling his diabetes to prevent worsening CKD and possible need for dialysis in future.  CMP today.

## 2021-02-17 NOTE — Progress Notes (Signed)
BP 132/83   Pulse 87   Temp 98.1 F (36.7 C) (Oral)   Wt 254 lb 6.4 oz (115.4 kg)   SpO2 96%   BMI 37.57 kg/m    Subjective:    Patient ID: Rodney Plumber [name], male    DOB: 1961/02/14, 60 y.o.   MRN: [id number]  HPI: Rodney Dixon. is a 60 y.o. male  Chief Complaint  Patient presents with   INR Check     Patient is here for a follow up INR check. Patient states he was given the lower dosage of his medication due to pharmacy issues and has since gotten the correct dosage, but thinks that may be why his INR was low at today's visit.    Diabetes    Patient states he was taken off of Metformin. Patient states he has not had an recent Diabetic Eye exam.    Hyperlipidemia   Hypertension   Chronic Kidney Disease   DIABETES  Saw Dr. Loanne Drilling 01/26/21 and is continuing on current on medications, with exception of Metformin due to kidneys (Farxiga, NPH, Ozempic continued) -- A1c 7.8%.   He has endorsed being an "eater" and not following diabetic diet.   Continues on Gabapentin -- taking 900 MG in the morning, 900 MG at lunch, and then 1200 MG before bed.  States this does offer some benefit.  He is aware we may need to reduce due to recent kidney function on labs with GFR 39.   Hypoglycemic episodes:no Polydipsia/polyuria: no Visual disturbance: no Chest pain: no Paresthesias: no Glucose Monitoring: not checking             Accucheck frequency: not checking             Fasting glucose:              Post prandial:             Evening:              Before meals: Taking Insulin?: yes -- NPH 50 units in morning             Long acting insulin:              Short acting insulin: Blood Pressure Monitoring: not checking Retinal Examination: Not up to Date Foot Exam: Up to Date Pneumovax: Up to Date Influenza: refused Aspirin: no   CHRONIC KIDNEY DISEASE Noted decline in levels recent labs -- initially noted changes in kidney function 09/23/19. CKD status: stable Medications  renally dose:  will dose Previous renal evaluation: no Pneumovax:  Up to Date Influenza Vaccine:  Not Up to Date    ATRIAL FIBRILLATION WITH METALLIC VALVE WITH HTN/HLD Currently takes Coumadin 9 MG daily with INR 2.7 last visit on 01/18/21-- today is 1.8 == declined due to not having correct dosage he reports, just picked up new pills yesterday.  Saw cardiology last 01/04/21.  He is to continue Benazepril and HCTZ, Carvedilol.  Continues on Atorvastatin and Fenofibrate for HLD.  Recent echo noted normal LV function.  Last saw neurology on 02/18/20 for history of recent stroke.   Atrial fibrillation status: stable Satisfied with current treatment: yes  Medication side effects:  no Medication compliance: good compliance Etiology of atrial fibrillation:  Palpitations:  no Chest pain:  no Dyspnea on exertion:  no Orthopnea:  no Syncope:  no Edema:  no Dizziness: recent episode on occasion, no falls Ventricular rate control: B blocker Anti-coagulation: warfarin  Relevant past medical, surgical, family and social history reviewed and updated as indicated. Interim medical history since our last visit reviewed. Allergies and medications reviewed and updated.  Review of Systems  Constitutional:  Negative for activity change, diaphoresis, fatigue and fever.  Respiratory:  Negative for cough, chest tightness, shortness of breath and wheezing.   Cardiovascular:  Negative for chest pain, palpitations and leg swelling.  Gastrointestinal: Negative.   Endocrine: Negative for cold intolerance, heat intolerance, polydipsia, polyphagia and polyuria.  Neurological: Negative.   Psychiatric/Behavioral: Negative.     Per HPI unless specifically indicated above     Objective:    BP 132/83   Pulse 87   Temp 98.1 F (36.7 C) (Oral)   Wt 254 lb 6.4 oz (115.4 kg)   SpO2 96%   BMI 37.57 kg/m   Wt Readings from Last 3 Encounters:  02/17/21 254 lb 6.4 oz (115.4 kg)  01/26/21 250 lb 9.6 oz (113.7 kg)   01/18/21 253 lb 6.4 oz (114.9 kg)    Physical Exam Vitals and nursing note reviewed.  Constitutional:      General: He is awake. He is not in acute distress.    Appearance: He is well-developed and well-groomed. He is morbidly obese. He is not ill-appearing.  HENT:     Head: Normocephalic and atraumatic.     Right Ear: Hearing normal. No drainage.     Left Ear: Hearing normal. No drainage.  Eyes:     General: Lids are normal.        Right eye: No discharge.        Left eye: No discharge.     Conjunctiva/sclera: Conjunctivae normal.     Pupils: Pupils are equal, round, and reactive to light.  Neck:     Thyroid: No thyromegaly.     Vascular: No carotid bruit or JVD.  Cardiovascular:     Rate and Rhythm: Normal rate and regular rhythm.     Heart sounds: Normal heart sounds, S1 normal and S2 normal. No murmur heard.   No gallop.  Pulmonary:     Effort: Pulmonary effort is normal. No accessory muscle usage or respiratory distress.     Breath sounds: Normal breath sounds.  Abdominal:     General: Bowel sounds are normal.     Palpations: Abdomen is soft.  Musculoskeletal:        General: Normal range of motion.     Right shoulder: Normal.     Left shoulder: Normal.     Cervical back: Normal range of motion and neck supple.     Right lower leg: No edema.     Left lower leg: No edema.  Skin:    General: Skin is warm and dry.     Capillary Refill: Capillary refill takes less than 2 seconds.     Findings: No rash.  Neurological:     Mental Status: He is alert and oriented to person, place, and time.     Deep Tendon Reflexes: Reflexes are normal and symmetric.  Psychiatric:        Attention and Perception: Attention normal.        Mood and Affect: Mood normal.        Speech: Speech normal.        Behavior: Behavior normal. Behavior is cooperative.        Thought Content: Thought content normal.    Results for orders placed or performed in visit on 02/17/21  CoaguChek XS/INR  Waived (STAT)  Result Value Ref Range   INR 1.8 (H) 0.9 - 1.1   Prothrombin Time 21.3 sec      Assessment & Plan:   Problem List Items Addressed This Visit       Cardiovascular and Mediastinum   Chronic atrial fibrillation (HCC)    Chronic, ongoing with Coumadin use.  INR today 1.8, not at goal 2.5 to 3.5 due to missing some doses. Elected to continue Coumadin 9 MG daily at this time, has new medications picked up.  Return in 4 weeks for INR check.  Return sooner to office if any bleeding or concerns.      Hypertension associated with type 2 diabetes mellitus (HCC)    Chronic, ongoing with BP at goal today.  Educated him on diabetes effect on vascular system.  Continue Benazepril, HCTZ, Coreg, and Fenofibrate/Atorvastatin.  Recommend he monitor BP at home at least a few mornings a week + focus on DASH diet.  Discussed with him stroke prevention goal for BP <130/90.  CMP today.  Continue cardiology collaboration.  Return in 3 months.      Relevant Orders   Comprehensive metabolic panel   CBC with Differential/Platelet     Endocrine   Hyperlipidemia associated with type 2 diabetes mellitus (HCC)    Chronic, ongoing.  Continue current medication regimen and adjust as needed.  Lipid panel today.      Relevant Orders   Comprehensive metabolic panel   Lipid Panel w/o Chol/HDL Ratio   Type 2 diabetes mellitus with diabetic chronic kidney disease ([place]) - Primary    Chronic, ongoing with A1C much improved recent endo visit at 7.8%, praised for this.  Continue NPH at current dosing and continue Ozempic + Farxiga -- NO Metformin due to CKD.  Continue Benazepril for kidney protection.  Focus heavily on diabetic diet and modest weight loss, decreasing carb and sugar intake at home.  Recommend he check BS 3-4 times a day and document for next visit for review.  Will continue to work with CCM team to ensure adherence to medication regimen and diet, which is a major issue.   Discussed at length his  A1C goal for stroke prevention being 6.5% or less.  Return in 3 months.      Relevant Orders   Comprehensive metabolic panel   Type 2 diabetes mellitus with peripheral neuropathy (HCC)    Chronic, ongoing with A1C much improved recent endo visit at 7.8%, praised for this.  Continue NPH at current dosing and continue Ozempic + Farxiga -- NO Metformin due to CKD.  Check CMP today, if ongoing low eGFR then discussed with patient will need to reduce Gabapentin for renal dosing -- may need to consider change to Lyrica in future.  Focus heavily on diabetic diet and modest weight loss, decreasing carb and sugar intake at home.  Recommend he check BS 3-4 times a day and document for next visit for review.  Will continue to work with CCM team to ensure adherence to medication regimen and diet, which is a major issue.   Discussed at length his A1C goal for stroke prevention being 6.5% or less.  Return in 3 months.        Genitourinary   CKD (chronic kidney disease) stage 3, GFR 30-59 ml/min (HCC)    Chronic, ongoing with T2DM.  Continue Benazepril for kidney protection.  Metformin recently stopped and discussed with patient dependent on labs may need to renally dose Gabapentin.  Recommend heavy focus on controlling his  diabetes to prevent worsening CKD and possible need for dialysis in future.  CMP today.      Relevant Orders   Comprehensive metabolic panel     Hematopoietic and Hemostatic   Acquired thrombophilia ([place])    Monitor CBC with use of daily Coumadin for mechanical valve and a-fib.  Denies any current bleeding episodes.  Check CBC next visit.      Relevant Orders   CoaguChek XS/INR Waived (STAT) (Completed)     Other   History of mechanical aortic valve replacement    Chronic, ongoing with Coumadin use.  INR today 1.8, not at goal 2.5 to 3.5 due to missing doses. Elected to continue Coumadin 9 MG daily as has picked up medications now.  Return in 4 weeks for INR check.  Return sooner to  office if any bleeding or concerns.        Morbid obesity (HCC)    BMI 37.57 with T2DM, HTN/HLD.  Recommended eating smaller high protein, low fat meals more frequently and exercising 30 mins a day 5 times a week with a goal of 10-15lb weight loss in the next 3 months. Patient voiced their understanding and motivation to adhere to these recommendations.       Other Visit Diagnoses     Benign prostatic hyperplasia without lower urinary tract symptoms       PSA today   Relevant Orders   PSA        Follow up plan: Return in about 4 weeks (around 03/17/2021) for INR check needs the 4:20 spot -- this is okay with me.

## 2021-02-17 NOTE — Assessment & Plan Note (Signed)
Chronic, ongoing with A1C much improved recent endo visit at 7.8%, praised for this.  Continue NPH at current dosing and continue Ozempic + Farxiga -- NO Metformin due to CKD.  Check CMP today, if ongoing low eGFR then discussed with patient will need to reduce Gabapentin for renal dosing -- may need to consider change to Lyrica in future.  Focus heavily on diabetic diet and modest weight loss, decreasing carb and sugar intake at home.  Recommend he check BS 3-4 times a day and document for next visit for review.  Will continue to work with CCM team to ensure adherence to medication regimen and diet, which is a major issue.   Discussed at length his A1C goal for stroke prevention being 6.5% or less.  Return in 3 months.

## 2021-02-18 LAB — PSA: Prostate Specific Ag, Serum: 0.4 ng/mL (ref 0.0–4.0)

## 2021-02-18 LAB — COMPREHENSIVE METABOLIC PANEL
ALT: 18 IU/L (ref 0–44)
AST: 16 IU/L (ref 0–40)
Albumin/Globulin Ratio: 1.6 (ref 1.2–2.2)
Albumin: 4.2 g/dL (ref 3.8–4.9)
Alkaline Phosphatase: 109 IU/L (ref 44–121)
BUN/Creatinine Ratio: 20 (ref 10–24)
BUN: 24 mg/dL (ref 8–27)
Bilirubin Total: 0.4 mg/dL (ref 0.0–1.2)
CO2: 21 mmol/L (ref 20–29)
Calcium: 9.4 mg/dL (ref 8.6–10.2)
Chloride: 99 mmol/L (ref 96–106)
Creatinine, Ser: 1.22 mg/dL (ref 0.76–1.27)
Globulin, Total: 2.7 g/dL (ref 1.5–4.5)
Glucose: 147 mg/dL — ABNORMAL HIGH (ref 70–99)
Potassium: 3.9 mmol/L (ref 3.5–5.2)
Sodium: 137 mmol/L (ref 134–144)
Total Protein: 6.9 g/dL (ref 6.0–8.5)
eGFR: 68 mL/min/{1.73_m2} (ref >59)

## 2021-02-18 LAB — CBC WITH DIFFERENTIAL/PLATELET
Basophils Absolute: 0.1 10*3/uL (ref 0.0–0.2)
Basos: 1 %
EOS (ABSOLUTE): 0.4 10*3/uL (ref 0.0–0.4)
Eos: 4 %
Hematocrit: 41.7 % (ref 37.5–51.0)
Hemoglobin: 14.4 g/dL (ref 13.0–17.7)
Immature Grans (Abs): 0.1 10*3/uL (ref 0.0–0.1)
Immature Granulocytes: 1 %
Lymphocytes Absolute: 2.8 10*3/uL (ref 0.7–3.1)
Lymphs: 26 %
MCH: 28.3 pg (ref 26.6–33.0)
MCHC: 34.5 g/dL (ref 31.5–35.7)
MCV: 82 fL (ref 79–97)
Monocytes Absolute: 0.9 10*3/uL (ref 0.1–0.9)
Monocytes: 8 %
Neutrophils Absolute: 6.6 10*3/uL (ref 1.4–7.0)
Neutrophils: 60 %
Platelets: 219 10*3/uL (ref 150–450)
RBC: 5.08 x10E6/uL (ref 4.14–5.80)
RDW: 13 % (ref 11.6–15.4)
WBC: 10.8 10*3/uL (ref 3.4–10.8)

## 2021-02-18 LAB — LIPID PANEL W/O CHOL/HDL RATIO
Cholesterol, Total: 130 mg/dL (ref 100–199)
HDL: 25 mg/dL — ABNORMAL LOW (ref >39)
LDL Chol Calc (NIH): 60 mg/dL (ref 0–99)
Triglycerides: 281 mg/dL — ABNORMAL HIGH (ref 0–149)
VLDL Cholesterol Cal: 45 mg/dL — ABNORMAL HIGH (ref 5–40)

## 2021-02-18 NOTE — Progress Notes (Signed)
Contacted via MyChart Estimated Creatinine Clearance: 80.7 mL/min (by C-G formula based on SCr of 1.22 mg/dL).   Good morning Rodney Dixon, your labs have returned.  Kidney function has returned to normal without Metformin on board.  GREAT NEWS!!  At this time we can maintain your current Gabapentin dosing.  Cholesterol levels show good LDL, but triglycerides remain slightly elevated, continue to work on diet.  Prostate lab is normal.  Any questions? Keep being amazing!!  Thank you for allowing me to participate in your care.  I appreciate you. Kindest regards, Noretta Frier

## 2021-02-24 ENCOUNTER — Telehealth: Payer: Self-pay | Admitting: Nurse Practitioner

## 2021-02-24 NOTE — Telephone Encounter (Signed)
Copied from CRM [id number]. Topic: General - Other >> Feb 24, 2021  1:57 PM Pawlus, Maxine Glenn A wrote: Reason for CRM: Pt stated he does not use MyChart and wanted a call to go over his latest lab results.

## 2021-02-25 NOTE — Telephone Encounter (Signed)
Patient informed of results and recommendations

## 2021-02-25 NOTE — Telephone Encounter (Signed)
Pt called back to report that he has yet to receive a call to discuss his lab results, please advise  Best contact: [phone]

## 2021-03-22 ENCOUNTER — Other Ambulatory Visit: Payer: Self-pay

## 2021-03-22 ENCOUNTER — Encounter: Payer: Self-pay | Admitting: Nurse Practitioner

## 2021-03-22 ENCOUNTER — Ambulatory Visit (INDEPENDENT_AMBULATORY_CARE_PROVIDER_SITE_OTHER): Payer: 59 | Admitting: Nurse Practitioner

## 2021-03-22 VITALS — BP 128/84 | HR 73 | Temp 97.7°F | Wt 247.0 lb

## 2021-03-22 DIAGNOSIS — Z8673 Personal history of transient ischemic attack (TIA), and cerebral infarction without residual deficits: Secondary | ICD-10-CM | POA: Diagnosis not present

## 2021-03-22 DIAGNOSIS — R42 Dizziness and giddiness: Secondary | ICD-10-CM | POA: Diagnosis not present

## 2021-03-22 DIAGNOSIS — E1159 Type 2 diabetes mellitus with other circulatory complications: Secondary | ICD-10-CM

## 2021-03-22 DIAGNOSIS — Z952 Presence of prosthetic heart valve: Secondary | ICD-10-CM | POA: Diagnosis not present

## 2021-03-22 DIAGNOSIS — I152 Hypertension secondary to endocrine disorders: Secondary | ICD-10-CM

## 2021-03-22 LAB — COAGUCHEK XS/INR WAIVED
INR: 2.1 — ABNORMAL HIGH (ref 0.9–1.1)
Prothrombin Time: 25.7 s

## 2021-03-22 MED ORDER — HYDROCHLOROTHIAZIDE 25 MG PO TABS
25.0000 mg | ORAL_TABLET | Freq: Every day | ORAL | 4 refills | Status: DC
Start: 2021-03-22 — End: 2022-03-24

## 2021-03-22 NOTE — Assessment & Plan Note (Signed)
With recurrent dizziness at this time, will have return to neurology and obtain CT head to recheck.

## 2021-03-22 NOTE — Progress Notes (Signed)
BP 128/84 (BP Location: Left Arm)   Pulse 73   Temp 97.7 F (36.5 C) (Oral)   Wt 247 lb (112 kg)   SpO2 96%   BMI 36.48 kg/m    Subjective:    Patient ID: Rodney Chary [name], male    DOB: [birth year], 60 y.o.   MRN: [id number]  CC: Coumadin management  HPI: This patient is a 60 y.o. male who presents for coumadin management. The expected duration of coumadin treatment is lifelong The reason for anticoagulation is  mechanical heart valve. Continues on Coumadin 9 MG daily.  Present Coumadin dose: 9 MG daily Goal: 2.5-3.5 The patient does not have an active anticoagulation episode. Excessive bruising: no Nose bleeding: no Rectal bleeding: no Prolonged menstrual cycles: N/A Eating diet with consistent amounts of foods containing Vitamin K:yes Any recent antibiotic use? no  DIZZINESS Ongoing dizzy spells over past two months, have become more frequent.  Having dizzy spells almost daily, had to leave work due to spells. No recent eye exam.  Does not notice it as much staying at home.  Notices it more when out and about.  Last saw neurology 02/18/20, history of CVA September 2021 post Covid. Subacute old lacunar infarct at heat of left caudate lobe and anterior limb left internal capsule. Duration: months Description of symptoms: lightheaded Duration of episode: minutes Dizziness frequency: recurrent Provoking factors:  unknown Aggravating factors:   unknown Triggered by rolling over in bed: no Triggered by bending over: yes Aggravated by head movement: yes Aggravated by exertion, coughing, loud noises: no Recent head injury: no Recent or current viral symptoms: no History of vasovagal episodes: no Nausea: no Vomiting: no Tinnitus: no Hearing loss: no Aural fullness: no Headache: no Photophobia/phonophobia: no Unsteady gait: yes Postural instability:  occasional Diplopia, dysarthria, dysphagia or weakness: no Related to exertion: no Pallor: no Diaphoresis:  no Dyspnea: no Chest pain: no   Relevant past medical, surgical, family and social history reviewed and updated as indicated. Interim medical history since our last visit reviewed. Allergies and medications reviewed and updated.  Review of Systems  Constitutional:  Negative for diaphoresis, fever, malaise/fatigue and weight loss.  HENT: Negative.    Respiratory: Negative.    Cardiovascular: Negative.   Neurological:  Positive for dizziness. Negative for tingling, speech change, focal weakness, seizures, loss of consciousness, weakness and headaches.  Psychiatric/Behavioral: Negative.         Objective:    BP 128/84 (BP Location: Left Arm)   Pulse 73   Temp 97.7 F (36.5 C) (Oral)   Wt 247 lb (112 kg)   SpO2 96%   BMI 36.48 kg/m   Wt Readings from Last 3 Encounters:  03/22/21 247 lb (112 kg)  02/17/21 254 lb 6.4 oz (115.4 kg)  01/26/21 250 lb 9.6 oz (113.7 kg)     Physical Exam Vitals and nursing note reviewed.  Constitutional:      General: He is awake. He is not in acute distress.    Appearance: He is well-groomed. He is obese. He is not ill-appearing or toxic-appearing.  HENT:     Head: Normocephalic.     Right Ear: Hearing, tympanic membrane, ear canal and external ear normal.     Left Ear: Hearing, tympanic membrane, ear canal and external ear normal.  Eyes:     General: Lids are normal.     Extraocular Movements: Extraocular movements intact.     Conjunctiva/sclera: Conjunctivae normal.     Pupils: Pupils are equal,  round, and reactive to light.     Right eye: Pupil is not sluggish.     Left eye: Pupil is not sluggish.     Visual Fields: Right eye visual fields normal and left eye visual fields normal.  Neck:     Thyroid: No thyromegaly.     Vascular: No carotid bruit.  Cardiovascular:     Rate and Rhythm: Normal rate and regular rhythm.     Heart sounds: Normal heart sounds. No murmur heard.   No friction rub.  Pulmonary:     Effort: No accessory muscle  usage or respiratory distress.  Abdominal:     General: There is no distension.     Palpations: Abdomen is soft.     Tenderness: There is no abdominal tenderness.  Musculoskeletal:     Right lower leg: No edema.     Left lower leg: No edema.  Lymphadenopathy:     Head:     Right side of head: No submental, submandibular, tonsillar, preauricular or posterior auricular adenopathy.     Left side of head: No submental, submandibular, tonsillar, preauricular or posterior auricular adenopathy.     Cervical: No cervical adenopathy.  Skin:    General: Skin is warm and dry.     Capillary Refill: Capillary refill takes less than 2 seconds.  Neurological:     Mental Status: He is alert and oriented to person, place, and time.     Cranial Nerves: Cranial nerves 2-12 are intact.     Motor: Motor function is intact. No weakness.     Coordination: Coordination is intact. Romberg sign negative.     Gait: Gait is intact.     Deep Tendon Reflexes:     Reflex Scores:      Brachioradialis reflexes are 1+ on the right side and 1+ on the left side.      Patellar reflexes are 1+ on the right side and 1+ on the left side. Psychiatric:        Behavior: Behavior is cooperative.     Last INR: 1.8    Last CBC:  Lab Results  Component Value Date   WBC 10.8 02/17/2021   HGB 14.4 02/17/2021   HCT 41.7 02/17/2021   MCV 82 02/17/2021   PLT 219 02/17/2021    Results for orders placed or performed in visit on 03/22/21  CoaguChek XS/INR Waived (STAT)  Result Value Ref Range   INR 2.1 (H) 0.9 - 1.1   Prothrombin Time 25.7 sec       Assessment:     ICD-10-CM   1. Hypertension associated with type 2 diabetes mellitus (HCC)  E11.59 hydrochlorothiazide (HYDRODIURIL) 25 MG tablet   I15.2    Refills on medication sent    2. History of mechanical aortic valve replacement  Z95.2 CoaguChek XS/INR Waived (STAT)    3. Dizziness  R42 Ambulatory referral to Neurology    CT HEAD WO CONTRAST ( )    4.  History of stroke  Z86.73       Plan:   Discussed current plan face-to-face with patient. For coumadin dosing, elected to continue current dose. Will plan to recheck INR in 1 month.

## 2021-03-22 NOTE — Assessment & Plan Note (Signed)
Ongoing over past several months with worsening, no red flags on exam today.  Due to past CVA history will repeat CT head to further assess, is high risk.  Will have return to neurology due to worsening dizziness.  Return in 4 weeks for follow-up, sooner if worsening.

## 2021-03-22 NOTE — Patient Instructions (Signed)

## 2021-03-22 NOTE — Assessment & Plan Note (Signed)
Chronic, ongoing with Coumadin use.  INR today 2.1, trending up from previous. Elected to continue Coumadin 9 MG daily and will adjust as beeded.  Return in 4 weeks for INR check.  Return sooner to office if any bleeding or concerns.

## 2021-03-22 NOTE — Assessment & Plan Note (Signed)
Chronic, ongoing with BP at goal today on recheck.  Educated him on diabetes effect on vascular system.  Continue Benazepril, HCTZ, Coreg, and Fenofibrate/Atorvastatin.  Recommend he monitor BP at home at least a few mornings a week + focus on DASH diet.  Discussed with him stroke prevention goal for BP <130/90.  CMP up to date.  Continue cardiology collaboration.  Return in 3 months.

## 2021-03-25 ENCOUNTER — Other Ambulatory Visit: Payer: Self-pay | Admitting: Nurse Practitioner

## 2021-03-25 NOTE — Telephone Encounter (Signed)
Requested medications are due for refill today.  yes  Requested medications are on the active medications list.  yes  Last refill. 02/19/2020  Future visit scheduled.   yes  Notes to clinic.  Medication not delegated.

## 2021-03-26 NOTE — Telephone Encounter (Signed)
Requested medication (s) are due for refill today:   Yes  Requested medication (s) are on the active medication list:   Yes  Future visit scheduled:   Yes   Last ordered: See pharmacy note.  This is a non delegated medication and pharmacy is questioning the dosing.     Requested Prescriptions  Pending Prescriptions Disp Refills   warfarin (COUMADIN) 5 MG tablet [Pharmacy Med Name: WARFARIN SODIUM 5 MG TABLET] 60 tablet 3    Sig: START TAKING 7 MG ON MONDAY, WEDNESDAY, FRIDAY, SATURDAY, SUNDAY AND 7.5 MG ON TUESDAY AND THURSDAY. RETURN IN ONE WEEK. UTILIZE 1 AND A 1/2 (5 MG) TABLETS TO GET YOUR 7.5 MG DOSING.     Hematology:  Anticoagulants - warfarin Failed - 03/25/2021 12:49 PM      Failed - This refill cannot be delegated      Failed - If the patient is managed by [facility] - route to their Pool. If not, forward to the provider.      Failed - INR in normal range and within 30 days    INR  Date Value Ref Range Status  03/22/2021 2.1 (H) 0.9 - 1.1 Final          Passed - Valid encounter within last 3 months    Recent Outpatient Visits           4 days ago Hypertension associated with type 2 diabetes mellitus (HCC)   Rodney Dixon, Rodney Dixon, Rodney Dixon   1 month ago Type 2 diabetes mellitus with stage 3a chronic kidney disease, with long-term current use of insulin (HCC)   Rodney Dixon, Rodney Dixon, Rodney Dixon   2 months ago Chronic atrial fibrillation (HCC)   Rodney Dixon, Rodney View Dixon, Rodney Dixon   3 months ago Chronic atrial fibrillation (HCC)   Rodney Dixon, Rodney Dixon, Rodney Dixon   3 months ago History of mechanical aortic valve replacement   Rodney Dixon, Rodney Church, Rodney Dixon       Future Appointments             In 1 month Cannady, [name], Rodney Dixon [employer], [facility]

## 2021-04-13 ENCOUNTER — Ambulatory Visit: Payer: 59 | Attending: Nurse Practitioner

## 2021-04-21 ENCOUNTER — Ambulatory Visit: Payer: 59 | Admitting: Endocrinology

## 2021-04-27 ENCOUNTER — Ambulatory Visit (INDEPENDENT_AMBULATORY_CARE_PROVIDER_SITE_OTHER): Payer: 59 | Admitting: Nurse Practitioner

## 2021-04-27 ENCOUNTER — Other Ambulatory Visit: Payer: Self-pay

## 2021-04-27 ENCOUNTER — Encounter: Payer: Self-pay | Admitting: Nurse Practitioner

## 2021-04-27 VITALS — BP 136/82 | HR 92 | Temp 98.0°F | Ht 68.0 in | Wt 247.2 lb

## 2021-04-27 DIAGNOSIS — R42 Dizziness and giddiness: Secondary | ICD-10-CM | POA: Diagnosis not present

## 2021-04-27 DIAGNOSIS — Z952 Presence of prosthetic heart valve: Secondary | ICD-10-CM

## 2021-04-27 DIAGNOSIS — M5136 Other intervertebral disc degeneration, lumbar region: Secondary | ICD-10-CM

## 2021-04-27 DIAGNOSIS — E1142 Type 2 diabetes mellitus with diabetic polyneuropathy: Secondary | ICD-10-CM

## 2021-04-27 LAB — COAGUCHEK XS/INR WAIVED
INR: 1.6 — ABNORMAL HIGH (ref 0.9–1.1)
Prothrombin Time: 18.6 s

## 2021-04-27 MED ORDER — WARFARIN SODIUM 5 MG PO TABS
ORAL_TABLET | ORAL | 4 refills | Status: DC
Start: 2021-04-27 — End: 2021-09-22

## 2021-04-27 NOTE — Assessment & Plan Note (Signed)
Chronic, ongoing with A1C much improved recent endo visit at 7.8%, praised for this.  Continue NPH at current dosing and continue Ozempic + Farxiga -- NO Metformin due to CKD.  Re educated him on neuropathy.  Focus heavily on diabetic diet and modest weight loss, decreasing carb and sugar intake at home.  Recommend he check BS 3-4 times a day and document for next visit for review.  Will continue to work with CCM team to ensure adherence to medication regimen and diet, which is a major issue.   Discussed at length his A1C goal for stroke prevention being 6.5% or less.  Obtain imaging lumbar spine to assess if lower back cause of neuropathy right leg.  Return in 3 months.

## 2021-04-27 NOTE — Assessment & Plan Note (Signed)
Chronic, ongoing with Coumadin use.  INR today 1.6, trending down from previous. Elected to restart Coumadin 9 MG daily and will adjust as beeded.  Return in 2 weeks for INR check.  Return sooner to office if any bleeding or concerns.

## 2021-04-27 NOTE — Assessment & Plan Note (Signed)
Ongoing over past several months with improvement at this time, no red flags on exam today.  Due to past CVA history will repeat CT head to further assess (ordered last visit, will check in on this), is high risk.  Continue collaboration with neurology + Meclizine as needed and ordered by them.

## 2021-04-27 NOTE — Progress Notes (Signed)
BP 136/82 (BP Location: Left Arm)    Pulse 92    Temp 98 F (36.7 C) (Oral)    Ht 5\' 8"  (1.727 m)    Wt 247 lb 3.2 oz (112.1 kg)    SpO2 96%    BMI 37.59 kg/m    Subjective:    Patient ID: [name], male    DOB: [birth year], 60 y.o.   MRN: [id number]  CC: Coumadin management  HPI: This patient is a 60 y.o. male who presents for coumadin management. The expected duration of coumadin treatment is lifelong The reason for anticoagulation is  mechanical heart valve. Continues on Coumadin 8 MG daily == he was to be taking 9 MG, but did not realize he was taking wrong dose.  Present Coumadin dose: 8 MG daily Goal: 2.5-3.5 The patient does not have an active anticoagulation episode. Excessive bruising: no Nose bleeding: no Rectal bleeding: no Prolonged menstrual cycles: N/A Eating diet with consistent amounts of foods containing Vitamin K:yes Any recent antibiotic use? no  DIZZINESS Follow-up for dizziness -- ordered CT scan last visit and does not appear to be scheduled.  Saw neurology on 03/30/21 -- started on Meclizine + to schedule for carotid u/s.    Ongoing dizzy spells over past months, have become more frequent.  Is noticing them less lately. Does not notice it as much staying at home.  Notices it more when out and about.  History of CVA September 2021 post Covid. Subacute old lacunar infarct at heat of left caudate lobe and anterior limb left internal capsule. Duration: months Description of symptoms: lightheaded Duration of episode: minutes Dizziness frequency: recurrent Provoking factors:  unknown Aggravating factors:   unknown Triggered by rolling over in bed: no Triggered by bending over: yes Aggravated by head movement: yes Aggravated by exertion, coughing, loud noises: no Recent head injury: no Recent or current viral symptoms: no History of vasovagal episodes: no Nausea: no Vomiting: no Tinnitus: no Hearing loss: no Aural fullness: no Headache:  no Photophobia/phonophobia: no Unsteady gait: yes Postural instability:  occasional Diplopia, dysarthria, dysphagia or weakness: no Related to exertion: no Pallor: no Diaphoresis: no Dyspnea: no Chest pain: no   NEUROPATHY Reports wishing to have imaging of right leg as continues to have ongoing numbness.  Feels it is poor circulation as toes are always cold.  Has been present for years.  Continues on Gabapentin.  Was told in past by his previous PCP was "wearing belt too tight".   Neuropathy status: uncontrolled  Satisfied with current treatment?: yes Medication side effects: no Medication compliance:  good compliance Location: right leg Pain: none Severity: mild  Quality:  tingling Frequency: intermittent Bilateral: no Symmetric: no Numbness: yes Decreased sensation: yes Weakness: no Context: fluctuating Alleviating factors: nothing Aggravating factors: unknown Treatments attempted: Gabapentin  Relevant past medical, surgical, family and social history reviewed and updated as indicated. Interim medical history since our last visit reviewed. Allergies and medications reviewed and updated.  Review of Systems  Constitutional:  Negative for diaphoresis, fever, malaise/fatigue and weight loss.  HENT: Negative.    Respiratory: Negative.    Cardiovascular: Negative.   Neurological:  Positive for dizziness. Negative for tingling, speech change, focal weakness, seizures, loss of consciousness, weakness and headaches.  Psychiatric/Behavioral: Negative.         Objective:    BP 136/82 (BP Location: Left Arm)    Pulse 92    Temp 98 F (36.7 C) (Oral)    Ht 5'  8" (1.727 m)    Wt 247 lb 3.2 oz (112.1 kg)    SpO2 96%    BMI 37.59 kg/m   Wt Readings from Last 3 Encounters:  04/27/21 247 lb 3.2 oz (112.1 kg)  03/22/21 247 lb (112 kg)  02/17/21 254 lb 6.4 oz (115.4 kg)     Physical Exam Vitals and nursing note reviewed.  Constitutional:      General: He is awake. He is not  in acute distress.    Appearance: He is well-groomed. He is obese. He is not ill-appearing or toxic-appearing.  HENT:     Head: Normocephalic.     Right Ear: Hearing, tympanic membrane, ear canal and external ear normal.     Left Ear: Hearing, tympanic membrane, ear canal and external ear normal.  Eyes:     General: Lids are normal.     Extraocular Movements: Extraocular movements intact.     Conjunctiva/sclera: Conjunctivae normal.     Pupils: Pupils are equal, round, and reactive to light.     Right eye: Pupil is not sluggish.     Left eye: Pupil is not sluggish.     Visual Fields: Right eye visual fields normal and left eye visual fields normal.  Neck:     Thyroid: No thyromegaly.     Vascular: No carotid bruit.  Cardiovascular:     Rate and Rhythm: Normal rate and regular rhythm.     Heart sounds: Normal heart sounds. No murmur heard.   No friction rub.  Pulmonary:     Effort: No accessory muscle usage or respiratory distress.  Abdominal:     General: There is no distension.     Palpations: Abdomen is soft.     Tenderness: There is no abdominal tenderness.  Musculoskeletal:     Right lower leg: No edema.     Left lower leg: No edema.  Lymphadenopathy:     Head:     Right side of head: No submental, submandibular, tonsillar, preauricular or posterior auricular adenopathy.     Left side of head: No submental, submandibular, tonsillar, preauricular or posterior auricular adenopathy.     Cervical: No cervical adenopathy.  Skin:    General: Skin is warm and dry.     Capillary Refill: Capillary refill takes less than 2 seconds.  Neurological:     Mental Status: He is alert and oriented to person, place, and time.     Cranial Nerves: Cranial nerves 2-12 are intact.     Motor: Motor function is intact. No weakness.     Coordination: Coordination is intact. Romberg sign negative.     Gait: Gait is intact.     Deep Tendon Reflexes:     Reflex Scores:      Brachioradialis  reflexes are 1+ on the right side and 1+ on the left side.      Patellar reflexes are 1+ on the right side and 1+ on the left side. Psychiatric:        Behavior: Behavior is cooperative.     Last INR: 2.1    Last CBC:  Lab Results  Component Value Date   WBC 10.8 02/17/2021   HGB 14.4 02/17/2021   HCT 41.7 02/17/2021   MCV 82 02/17/2021   PLT 219 02/17/2021    Results for orders placed or performed in visit on 04/27/21  CoaguChek XS/INR Waived  Result Value Ref Range   INR 1.6 (H) 0.9 - 1.1   Prothrombin Time 18.6 sec  Assessment:     ICD-10-CM   1. Type 2 diabetes mellitus with peripheral neuropathy (HCC)  E11.42 DG Lumbar Spine Complete    2. History of mechanical aortic valve replacement  Z95.2 CoaguChek XS/INR Waived    3. Dizziness  R42       Plan:   Discussed current plan face-to-face with patient. For coumadin dosing, elected to go to 9 MG daily. Will plan to recheck INR in 2 weeks.

## 2021-04-27 NOTE — Patient Instructions (Signed)
Neuropathic Pain °Neuropathic pain is pain caused by damage to the nerves that are responsible for certain sensations in your body (sensory nerves). °Neuropathic pain can make you more sensitive to pain. Even a minor sensation can feel very painful. This is usually a long-term (chronic) condition that can be difficult to treat. The type of pain differs from person to person. It may: °Start suddenly (acute), or it may develop slowly and become chronic. °Come and go as damaged nerves heal, or it may stay at the same level for years. °Cause emotional distress, loss of sleep, and a lower quality of life. °What are the causes? °The most common cause of this condition is diabetes. Many other diseases and conditions can also cause neuropathic pain. Causes of neuropathic pain can be classified as: °Toxic. This is caused by medicines and chemicals. The most common causes of toxic neuropathic pain is damage from medicines that kill cancer cells (chemotherapy) or alcohol abuse. °Metabolic. This can be caused by: °Diabetes. °Lack of vitamins like B12. °Traumatic. Any injury that cuts, crushes, or stretches a nerve can cause damage and pain. °Compression-related. If a sensory nerve gets trapped or compressed for a long period of time, the blood supply to the nerve can be cut off. °Vascular. Many blood vessel diseases can cause neuropathic pain by decreasing blood supply and oxygen to nerves. °Autoimmune. This type of pain results from diseases in which the body's defense system (immune system) mistakenly attacks sensory nerves. Examples of autoimmune diseases that can cause neuropathic pain include lupus and multiple sclerosis. °Infectious. Many types of viral infections can damage sensory nerves and cause pain. Shingles infection is a common cause of this type of pain. °Inherited. Neuropathic pain can be a symptom of many diseases that are passed down through families (genetic). °What increases the risk? °You are more likely to  develop this condition if: °You have diabetes. °You smoke. °You drink too much alcohol. °You are taking certain medicines, including chemotherapy or medicines that treat immune system disorders. °What are the signs or symptoms? °The main symptom is pain. Neuropathic pain is often described as: °Burning. °Shock-like. °Stinging. °Hot or cold. °Itching. °How is this diagnosed? °No single test can diagnose neuropathic pain. It is diagnosed based on: °A physical exam and your symptoms. Your health care provider will ask you about your pain. You may be asked to use a pain scale to describe how bad your pain is. °Tests. These may be done to see if you have a cause and location of any nerve damage. They include: °Nerve conduction studies and electromyography to test how well nerve signals travel through your nerves and muscles (electrodiagnostic testing). °Skin biopsy to evaluate for small fiber neuropathy. °Imaging studies, such as: °X-rays. °CT scan. °MRI. °How is this treated? °Treatment for neuropathic pain may change over time. You may need to try different treatment options or a combination of treatments. Some options include: °Treating the underlying cause of the neuropathy, such as diabetes, kidney disease, or vitamin deficiencies. °Stopping medicines that can cause neuropathy, such as chemotherapy. °Medicine to relieve pain. Medicines may include: °Prescription or over-the-counter pain medicine. °Anti-seizure medicine. °Antidepressant medicines. °Pain-relieving patches or creams that are applied to painful areas of skin. °A medicine to numb the area (local anesthetic), which can be injected as a nerve block. °Transcutaneous nerve stimulation. This uses electrical currents to block painful nerve signals. The treatment is painless. °Alternative treatments, such as: °Acupuncture. °Meditation. °Massage. °Occupational or physical therapy. °Pain management programs. °Counseling. °Follow   these instructions at  home: °Medicines ° °Take over-the-counter and prescription medicines only as told by your health care provider. °Ask your health care provider if the medicine prescribed to you: °Requires you to avoid driving or using machinery. °Can cause constipation. You may need to take these actions to prevent or treat constipation: °Drink enough fluid to keep your urine pale yellow. °Take over-the-counter or prescription medicines. °Eat foods that are high in fiber, such as beans, whole grains, and fresh fruits and vegetables. °Limit foods that are high in fat and processed sugars, such as fried or sweet foods. °Lifestyle ° °Have a good support system at home. °Consider joining a chronic pain support group. °Do not use any products that contain nicotine or tobacco. These products include cigarettes, chewing tobacco, and vaping devices, such as e-cigarettes. If you need help quitting, ask your health care provider. °Do not drink alcohol. °General instructions °Learn as much as you can about your condition. °Work closely with all your health care providers to find the treatment plan that works best for you. °Ask your health care provider what activities are safe for you. °Keep all follow-up visits. This is important. °Contact a health care provider if: °Your pain treatments are not working. °You are having side effects from your medicines. °You are struggling with tiredness (fatigue), mood changes, depression, or anxiety. °Get help right away if: °You have thoughts of hurting yourself. °Get help right away if you feel like you may hurt yourself or others, or have thoughts about taking your own life. Go to your nearest emergency room or: °Call 911. °Call the National Suicide Prevention Lifeline at 1-800-273-8255 or 988. This is open 24 hours a day. °Text the Crisis Text Line at 741741. °Summary °Neuropathic pain is pain caused by damage to the nerves that are responsible for certain sensations in your body (sensory  nerves). °Neuropathic pain may come and go as damaged nerves heal, or it may stay at the same level for years. °Neuropathic pain is usually a long-term condition that can be difficult to treat. Consider joining a chronic pain support group. °This information is not intended to replace advice given to you by your health care provider. Make sure you discuss any questions you have with your health care provider. °Document Revised: 12/21/2020 Document Reviewed: 12/21/2020 °Elsevier Patient Education © 2022 [employer] ° °

## 2021-04-28 ENCOUNTER — Ambulatory Visit
Admission: RE | Admit: 2021-04-28 | Discharge: 2021-04-28 | Disposition: A | Discharge status: Home / Self Care | Payer: 59 | Source: Ambulatory Visit | Attending: Nurse Practitioner | Admitting: Nurse Practitioner

## 2021-04-28 DIAGNOSIS — E1142 Type 2 diabetes mellitus with diabetic polyneuropathy: Secondary | ICD-10-CM | POA: Insufficient documentation

## 2021-04-29 DIAGNOSIS — M5136 Other intervertebral disc degeneration, lumbar region: Secondary | ICD-10-CM | POA: Insufficient documentation

## 2021-04-29 NOTE — Progress Notes (Signed)
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  Is he interested in trying some physical therapy, I would recommend this.  Any questions? Keep being awesome!!  Thank you for allowing me to participate in your care.  I appreciate you. Kindest regards, Jany Buckwalter

## 2021-04-29 NOTE — Addendum Note (Signed)
Addended by: Aura Dials T on: 04/29/2021 06:11 PM   Modules accepted: Orders

## 2021-05-05 NOTE — Addendum Note (Signed)
Addended by: Aura Dials T on: 05/05/2021 04:55 PM   Modules accepted: Orders

## 2021-05-12 ENCOUNTER — Other Ambulatory Visit: Payer: Self-pay

## 2021-05-12 ENCOUNTER — Ambulatory Visit
Admission: RE | Admit: 2021-05-12 | Discharge: 2021-05-12 | Disposition: A | Discharge status: Home / Self Care | Payer: 59 | Source: Ambulatory Visit | Attending: Nurse Practitioner | Admitting: Nurse Practitioner

## 2021-05-12 DIAGNOSIS — R42 Dizziness and giddiness: Secondary | ICD-10-CM | POA: Diagnosis present

## 2021-05-12 NOTE — Progress Notes (Signed)
Good afternoon, please let Rodney Dixon know his CT returned and there are no new stroke areas, which is great news.  Old stroke area continues to be noted, but no new ones.  Overall stable CT scan with similar findings to previous.  I do recommend he follow-up with neurology if ongoing dizzy episodes.  Any questions? Keep being stellar!!  Thank you for allowing me to participate in your care.  I appreciate you. Kindest regards, Chondra Boyde

## 2021-05-14 ENCOUNTER — Ambulatory Visit: Payer: Self-pay | Admitting: Nurse Practitioner

## 2021-05-14 DIAGNOSIS — Z952 Presence of prosthetic heart valve: Secondary | ICD-10-CM

## 2021-05-17 ENCOUNTER — Ambulatory Visit: Payer: 59 | Attending: Nurse Practitioner

## 2021-06-13 NOTE — Patient Instructions (Signed)

## 2021-06-16 ENCOUNTER — Other Ambulatory Visit: Payer: Self-pay

## 2021-06-16 ENCOUNTER — Ambulatory Visit (INDEPENDENT_AMBULATORY_CARE_PROVIDER_SITE_OTHER): Payer: 59 | Admitting: Nurse Practitioner

## 2021-06-16 ENCOUNTER — Encounter: Payer: Self-pay | Admitting: Nurse Practitioner

## 2021-06-16 VITALS — BP 138/78 | HR 92 | Temp 98.2°F | Ht 68.0 in | Wt 247.2 lb

## 2021-06-16 DIAGNOSIS — E1122 Type 2 diabetes mellitus with diabetic chronic kidney disease: Secondary | ICD-10-CM

## 2021-06-16 DIAGNOSIS — E1129 Type 2 diabetes mellitus with other diabetic kidney complication: Secondary | ICD-10-CM | POA: Diagnosis not present

## 2021-06-16 DIAGNOSIS — E1159 Type 2 diabetes mellitus with other circulatory complications: Secondary | ICD-10-CM

## 2021-06-16 DIAGNOSIS — I152 Hypertension secondary to endocrine disorders: Secondary | ICD-10-CM

## 2021-06-16 DIAGNOSIS — R809 Proteinuria, unspecified: Secondary | ICD-10-CM

## 2021-06-16 DIAGNOSIS — I482 Chronic atrial fibrillation, unspecified: Secondary | ICD-10-CM

## 2021-06-16 DIAGNOSIS — Z952 Presence of prosthetic heart valve: Secondary | ICD-10-CM

## 2021-06-16 DIAGNOSIS — N1831 Chronic kidney disease, stage 3a: Secondary | ICD-10-CM

## 2021-06-16 DIAGNOSIS — Z8673 Personal history of transient ischemic attack (TIA), and cerebral infarction without residual deficits: Secondary | ICD-10-CM

## 2021-06-16 DIAGNOSIS — Z794 Long term (current) use of insulin: Secondary | ICD-10-CM

## 2021-06-16 DIAGNOSIS — E1169 Type 2 diabetes mellitus with other specified complication: Secondary | ICD-10-CM

## 2021-06-16 DIAGNOSIS — E1142 Type 2 diabetes mellitus with diabetic polyneuropathy: Secondary | ICD-10-CM

## 2021-06-16 DIAGNOSIS — E785 Hyperlipidemia, unspecified: Secondary | ICD-10-CM

## 2021-06-16 DIAGNOSIS — R42 Dizziness and giddiness: Secondary | ICD-10-CM

## 2021-06-16 DIAGNOSIS — D6869 Other thrombophilia: Secondary | ICD-10-CM

## 2021-06-16 LAB — COAGUCHEK XS/INR WAIVED
INR: 2.6 — ABNORMAL HIGH (ref 0.9–1.1)
Prothrombin Time: 31.3 s

## 2021-06-16 LAB — MICROALBUMIN, URINE WAIVED
Creatinine, Urine Waived: 100 mg/dL (ref 10–300)
Microalb, Ur Waived: 30 mg/L — ABNORMAL HIGH (ref 0–19)
Microalb/Creat Ratio: <30 mg/g (ref <30)

## 2021-06-16 LAB — BAYER DCA HB A1C WAIVED: HB A1C (BAYER DCA - WAIVED): 10 % — ABNORMAL HIGH (ref 4.8–5.6)

## 2021-06-16 MED ORDER — SEMAGLUTIDE (1 MG/DOSE) 4 MG/3ML ~~LOC~~ SOPN: 1.0000 mg | PEN_INJECTOR | SUBCUTANEOUS | 3 refills | Status: DC

## 2021-06-16 NOTE — Assessment & Plan Note (Signed)
Discussed at length stroke prevention goals BP <130/90, A1c <6.5%, and LDL <70.  He continues to have poor diabetes control, which have discussed at length with him -- much is related to poor diet choices. 

## 2021-06-16 NOTE — Assessment & Plan Note (Signed)
Chronic, ongoing with Coumadin use.  INR today 2.6, at goal 2.5 to 3.5. Elected to continue Coumadin 10 MG daily.  Return in 4 weeks for INR check.  Return sooner to office if any bleeding or concerns.

## 2021-06-16 NOTE — Progress Notes (Signed)
BP 138/78 (BP Location: Left Arm, Patient Position: Sitting, Cuff Size: Large)    Pulse 92    Temp 98.2 F (36.8 C) (Oral)    Ht 5\' 8"  (1.727 m)    Wt 247 lb 3.2 oz (112.1 kg)    SpO2 95%    BMI 37.59 kg/m    Subjective:    Patient ID: Rodney Plumber [name], male    DOB: 1960-07-07, 61 y.o.   MRN: [id number]  HPI: Rodney Mow. is a 61 y.o. male  Chief Complaint  Patient presents with   Peripheral Neuropathy   Dizziness    Patient states the dizziness comes every once and a while and he thinks it may come when his sugar levels are elevated.    INR Check    Diabetes    Patient denies having a recent Diabetic Eye Exam.    DIABETES  Saw Dr. Loanne Rodney Dixon last 01/26/21 and is continuing on current medications, Farxiga, NPH, Ozempic 0.5 MG continued -- A1c 7.8% last check September.   Was scheduled to return to endo in December, but on review had a "no show", which he reports he did not know about.  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  States this does offer some benefit.   Hypoglycemic episodes:no Polydipsia/polyuria: no Visual disturbance: no Chest pain: no Paresthesias: no Glucose Monitoring: not checking             Accucheck frequency: not checking             Fasting glucose:              Post prandial:             Evening:              Before meals: Taking Insulin?: yes -- NPH 50 units in morning             Long acting insulin:              Short acting insulin: Blood Pressure Monitoring: not checking Retinal Examination: Not up to Date Foot Exam: Up to Date Pneumovax: Up to Date Influenza: refused Aspirin: no   CHRONIC KIDNEY DISEASE Improved recent labs -- initially noted changes in kidney function 09/23/19. CKD status: stable Medications renally dose:  will dose Previous renal evaluation: no Pneumovax:  Up to Date Influenza  Vaccine:  Not Up to Date    ATRIAL FIBRILLATION WITH METALLIC VALVE WITH HTN/HLD Currently takes Coumadin 10 MG daily with INR 1.6 last visit on 04/27/21-- today is 2.6.  Saw cardiology last 01/04/21.  He is to continue Benazepril and HCTZ, Carvedilol.  Continues on Atorvastatin and Fenofibrate for HLD.  Recent echo noted normal LV function.    Last saw neurology on 02/18/20 for history of recent stroke.  Has dizziness ongoing on occasion -- recent CT remains reassuring.  He reports dizziness at this time is improving. Atrial fibrillation status: stable Satisfied with current treatment: yes  Medication side effects:  no Medication compliance: good compliance Etiology of atrial fibrillation:  Palpitations:  no Chest pain:  no Dyspnea on exertion:  no Orthopnea:  no Syncope:  no Edema:  no Dizziness: recent episode on occasion, no falls Ventricular rate control: B blocker Anti-coagulation: warfarin  Relevant past medical, surgical, family and social  history reviewed and updated as indicated. Interim medical history since our last visit reviewed. Allergies and medications reviewed and updated.  Review of Systems  Constitutional:  Negative for activity change, diaphoresis, fatigue and fever.  Respiratory:  Negative for cough, chest tightness, shortness of breath and wheezing.   Cardiovascular:  Negative for chest pain, palpitations and leg swelling.  Gastrointestinal: Negative.   Endocrine: Negative for cold intolerance, heat intolerance, polydipsia, polyphagia and polyuria.  Neurological: Negative.   Psychiatric/Behavioral: Negative.     Per HPI unless specifically indicated above     Objective:    BP 138/78 (BP Location: Left Arm, Patient Position: Sitting, Cuff Size: Large)    Pulse 92    Temp 98.2 F (36.8 C) (Oral)    Ht 5\' 8"  (1.727 m)    Wt 247 lb 3.2 oz (112.1 kg)    SpO2 95%    BMI 37.59 kg/m   Wt Readings from Last 3 Encounters:  06/16/21 247 lb 3.2 oz (112.1 kg)   04/27/21 247 lb 3.2 oz (112.1 kg)  03/22/21 247 lb (112 kg)    Physical Exam Vitals and nursing note reviewed.  Constitutional:      General: He is awake. He is not in acute distress.    Appearance: He is well-developed and well-groomed. He is morbidly obese. He is not ill-appearing.  HENT:     Head: Normocephalic and atraumatic.     Right Ear: Hearing normal. No drainage.     Left Ear: Hearing normal. No drainage.  Eyes:     General: Lids are normal.        Right eye: No discharge.        Left eye: No discharge.     Conjunctiva/sclera: Conjunctivae normal.     Pupils: Pupils are equal, round, and reactive to light.  Neck:     Thyroid: No thyromegaly.     Vascular: No carotid bruit or JVD.  Cardiovascular:     Rate and Rhythm: Normal rate and regular rhythm.     Heart sounds: Normal heart sounds, S1 normal and S2 normal. No murmur heard.   No gallop.  Pulmonary:     Effort: Pulmonary effort is normal. No accessory muscle usage or respiratory distress.     Breath sounds: Normal breath sounds.  Abdominal:     General: Bowel sounds are normal.     Palpations: Abdomen is soft.  Musculoskeletal:        General: Normal range of motion.     Right shoulder: Normal.     Left shoulder: Normal.     Cervical back: Normal range of motion and neck supple.     Right lower leg: No edema.     Left lower leg: No edema.  Skin:    General: Skin is warm and dry.     Capillary Refill: Capillary refill takes less than 2 seconds.     Findings: No rash.  Neurological:     Mental Status: He is alert and oriented to person, place, and time.     Deep Tendon Reflexes: Reflexes are normal and symmetric.  Psychiatric:        Attention and Perception: Attention normal.        Mood and Affect: Mood normal.        Speech: Speech normal.        Behavior: Behavior normal. Behavior is cooperative.        Thought Content: Thought content normal.   Results for orders placed  or performed in visit on  06/16/21  Bayer DCA Hb A1c Waived  Result Value Ref Range   HB A1C (BAYER DCA - WAIVED) 10.0 (H) 4.8 - 5.6 %  Microalbumin, Urine Waived  Result Value Ref Range   Microalb, Ur Waived 30 (H) 0 - 19 mg/L   Creatinine, Urine Waived 100 10 - 300 mg/dL   Microalb/Creat Ratio <30 <30 mg/g  CoaguChek XS/INR Waived  Result Value Ref Range   INR 2.6 (H) 0.9 - 1.1   Prothrombin Time 31.3 sec      Assessment & Plan:   Problem List Items Addressed This Visit       Cardiovascular and Mediastinum   Chronic atrial fibrillation (HCC)    Chronic, ongoing with Coumadin use.  INR today 2.6, at goal 2.5 to 3.5. Elected to continue Coumadin 10 MG daily.  Return in 4 weeks for INR check.  Return sooner to office if any bleeding or concerns.      Relevant Orders   CBC with Differential/Platelet   Hypertension associated with type 2 diabetes mellitus (HCC)   Relevant Medications   Semaglutide, 1 MG/DOSE, 4 MG/3ML SOPN   Other Relevant Orders   Bayer DCA Hb A1c Waived (Completed)   Microalbumin, Urine Waived (Completed)   CBC with Differential/Platelet   Comprehensive metabolic panel   TSH     Endocrine   Hyperlipidemia associated with type 2 diabetes mellitus (HCC)    Chronic, ongoing.  Continue current medication regimen and adjust as needed.  Lipid panel today.      Relevant Medications   Semaglutide, 1 MG/DOSE, 4 MG/3ML SOPN   Other Relevant Orders   Bayer DCA Hb A1c Waived (Completed)   Comprehensive metabolic panel   Lipid Panel w/o Chol/HDL Ratio   Type 2 diabetes mellitus with diabetic chronic kidney disease (HCC)    Chronic, ongoing with A1c elevated on check today at 10%, previous 7.8% -- missed December endo visit and highly recommended today that he call and reschedule.  Urine ALB 30.  Obtain CMP, CBC, TSH. - Continue NPH at current dosing and increase Ozempic to 1 MG weekly + Farxiga continue at current dosing -- no Metformin due to CKD.   - Continue Benazepril for kidney  protection.   - Focus heavily on diabetic diet and modest weight loss, decreasing carb and sugar intake at home -- have discussed at length at visits.   - Recommend he check BS 3-4 times a day and document for next visit for review.  Will continue to work with CCM team to ensure adherence to medication regimen and diet, which is a major issue.   Discussed at length his A1C goal for stroke prevention being 6.5% or less.  Return in 3 months.      Relevant Medications   Semaglutide, 1 MG/DOSE, 4 MG/3ML SOPN   Other Relevant Orders   Bayer DCA Hb A1c Waived (Completed)   Microalbumin, Urine Waived (Completed)   Comprehensive metabolic panel   Type 2 diabetes mellitus with peripheral neuropathy (HCC)    Chronic, ongoing with A1c elevated on check today at 10%, previous 7.8% -- missed December endo visit and highly recommended today that he call and reschedule.   - Continue NPH at current dosing and increase Ozempic to 1 MG weekly + Farxiga continue at current dosing -- no Metformin due to CKD.   - Continue Gabapentin, may need to adjust dosing based on kidney function this check. - Focus heavily on diabetic diet  and modest weight loss, decreasing carb and sugar intake at home -- have discussed at length at visits.   - Recommend he check BS 3-4 times a day and document for next visit for review.  Will continue to work with CCM team to ensure adherence to medication regimen and diet, which is a major issue.   Discussed at length his A1C goal for stroke prevention being 6.5% or less.  Return in 3 months.      Relevant Medications   Semaglutide, 1 MG/DOSE, 4 MG/3ML SOPN   Other Relevant Orders   Bayer DCA Hb A1c Waived (Completed)   Microalbumin, Urine Waived (Completed)   Comprehensive metabolic panel   Type 2 diabetes mellitus with proteinuria (HCC) - Primary    Chronic, ongoing with A1c elevated on check today at 10%, previous 7.8% -- missed December endo visit and highly recommended today that  he call and reschedule.  Urine ALB 30.   - Continue NPH at current dosing and increase Ozempic to 1 MG weekly + Farxiga continue at current dosing -- no Metformin due to CKD.   - Continue Benazepril for kidney protection.   - Focus heavily on diabetic diet and modest weight loss, decreasing carb and sugar intake at home -- have discussed at length at visits.   - Recommend he check BS 3-4 times a day and document for next visit for review.  Will continue to work with CCM team to ensure adherence to medication regimen and diet, which is a major issue.   Discussed at length his A1C goal for stroke prevention being 6.5% or less.  Return in 3 months.      Relevant Medications   Semaglutide, 1 MG/DOSE, 4 MG/3ML SOPN   Other Relevant Orders   Bayer DCA Hb A1c Waived (Completed)   Microalbumin, Urine Waived (Completed)   Comprehensive metabolic panel     Genitourinary   CKD (chronic kidney disease) stage 3, GFR 30-59 ml/min (HCC)    Chronic, ongoing with T2DM.  Continue Benazepril for kidney protection, urine ALB 30 (February 2023).  Discussed with patient dependent on labs may need to renally dose Gabapentin.  Recommend heavy focus on controlling his diabetes to prevent worsening CKD and possible need for dialysis in future.  CMP today.      Relevant Orders   CBC with Differential/Platelet   Comprehensive metabolic panel   VITAMIN D 25 Hydroxy (Vit-D Deficiency, Fractures)     Hematopoietic and Hemostatic   Acquired thrombophilia (HCC)    Monitor CBC with use of daily Coumadin for mechanical valve and a-fib.  Denies any current bleeding episodes.  Check CBC next visit.      Relevant Orders   CBC with Differential/Platelet     Other   Dizziness    Ongoing over past several months with improvement at this time, no red flags on exam today.  Recent CT scan remains stable, suspect much of dizziness is based on poor diabetes control.  Continue collaboration with neurology + Meclizine as needed  and ordered by them.      History of mechanical aortic valve replacement    Chronic, ongoing with Coumadin use.  INR today 2.6, at goal 2.5 to 3.5. Elected to continue Coumadin 10 MG daily.  Return in 4 weeks for INR check.  Return sooner to office if any bleeding or concerns.      Relevant Orders   CoaguChek XS/INR Waived   History of stroke    Discussed at length stroke  prevention goals BP <130/90, A1c <6.5%, and LDL <70.  He continues to have poor diabetes control, which have discussed at length with him -- much is related to poor diet choices.      Long-term insulin use (HCC)    Chronic, ongoing.  Refer to diabetes plan of care for further.      Relevant Orders   Bayer DCA Hb A1c Waived (Completed)   Comprehensive metabolic panel   Morbid obesity (HCC)    BMI 37.59 with T2DM, HTN/HLD.  Recommended eating smaller high protein, low fat meals more frequently and exercising 30 mins a day 5 times a week with a goal of 10-15lb weight loss in the next 3 months. Patient voiced their understanding and motivation to adhere to these recommendations.       Relevant Medications   Semaglutide, 1 MG/DOSE, 4 MG/3ML SOPN     Follow up plan: Return in about 4 weeks (around 07/14/2021) for INR CHECK.

## 2021-06-16 NOTE — Assessment & Plan Note (Signed)
Chronic, ongoing.  Continue current medication regimen and adjust as needed. Lipid panel today. 

## 2021-06-16 NOTE — Assessment & Plan Note (Signed)
Chronic, ongoing with A1c elevated on check today at 10%, previous 7.8% -- missed December endo visit and highly recommended today that he call and reschedule.  Urine ALB 30.   - Continue NPH at current dosing and increase Ozempic to 1 MG weekly + Farxiga continue at current dosing -- no Metformin due to CKD.   - Continue Benazepril for kidney protection.   - Focus heavily on diabetic diet and modest weight loss, decreasing carb and sugar intake at home -- have discussed at length at visits.   - Recommend he check BS 3-4 times a day and document for next visit for review.  Will continue to work with CCM team to ensure adherence to medication regimen and diet, which is a major issue.   Discussed at length his A1C goal for stroke prevention being 6.5% or less.  Return in 3 months.

## 2021-06-16 NOTE — Assessment & Plan Note (Signed)
Ongoing over past several months with improvement at this time, no red flags on exam today.  Recent CT scan remains stable, suspect much of dizziness is based on poor diabetes control.  Continue collaboration with neurology + Meclizine as needed and ordered by them.

## 2021-06-16 NOTE — Assessment & Plan Note (Signed)
Monitor CBC with use of daily Coumadin for mechanical valve and a-fib.  Denies any current bleeding episodes.  Check CBC next visit.

## 2021-06-16 NOTE — Assessment & Plan Note (Signed)
Chronic, ongoing with A1c elevated on check today at 10%, previous 7.8% -- missed December endo visit and highly recommended today that he call and reschedule.   - Continue NPH at current dosing and increase Ozempic to 1 MG weekly + Farxiga continue at current dosing -- no Metformin due to CKD.   - Continue Gabapentin, may need to adjust dosing based on kidney function this check. - Focus heavily on diabetic diet and modest weight loss, decreasing carb and sugar intake at home -- have discussed at length at visits.   - Recommend he check BS 3-4 times a day and document for next visit for review.  Will continue to work with CCM team to ensure adherence to medication regimen and diet, which is a major issue.   Discussed at length his A1C goal for stroke prevention being 6.5% or less.  Return in 3 months.

## 2021-06-16 NOTE — Assessment & Plan Note (Addendum)
Chronic, ongoing with T2DM.  Continue Benazepril for kidney protection, urine ALB 30 (February 2023).  Discussed with patient dependent on labs may need to renally dose Gabapentin.  Recommend heavy focus on controlling his diabetes to prevent worsening CKD and possible need for dialysis in future.  CMP today. 

## 2021-06-16 NOTE — Assessment & Plan Note (Signed)
BMI 37.59 with T2DM, HTN/HLD.  Recommended eating smaller high protein, low fat meals more frequently and exercising 30 mins a day 5 times a week with a goal of 10-15lb weight loss in the next 3 months. Patient voiced their understanding and motivation to adhere to these recommendations.

## 2021-06-16 NOTE — Assessment & Plan Note (Signed)
Chronic, ongoing with A1c elevated on check today at 10%, previous 7.8% -- missed December endo visit and highly recommended today that he call and reschedule.  Urine ALB 30.  Obtain CMP, CBC, TSH. - Continue NPH at current dosing and increase Ozempic to 1 MG weekly + Farxiga continue at current dosing -- no Metformin due to CKD.   - Continue Benazepril for kidney protection.   - Focus heavily on diabetic diet and modest weight loss, decreasing carb and sugar intake at home -- have discussed at length at visits.   - Recommend he check BS 3-4 times a day and document for next visit for review.  Will continue to work with CCM team to ensure adherence to medication regimen and diet, which is a major issue.   Discussed at length his A1C goal for stroke prevention being 6.5% or less.  Return in 3 months.

## 2021-06-16 NOTE — Assessment & Plan Note (Signed)
Chronic, ongoing.  Refer to diabetes plan of care for further. 

## 2021-06-16 NOTE — Assessment & Plan Note (Signed)
Chronic, ongoing with Coumadin use.  INR today 2.6, at goal 2.5 to 3.5. Elected to continue Coumadin 10 MG daily.  Return in 4 weeks for INR check.  Return sooner to office if any bleeding or concerns. °

## 2021-06-17 LAB — COMPREHENSIVE METABOLIC PANEL
ALT: 28 IU/L (ref 0–44)
AST: 21 IU/L (ref 0–40)
Albumin/Globulin Ratio: 1.5 (ref 1.2–2.2)
Albumin: 4.3 g/dL (ref 3.8–4.8)
Alkaline Phosphatase: 105 IU/L (ref 44–121)
BUN/Creatinine Ratio: 15 (ref 10–24)
BUN: 17 mg/dL (ref 8–27)
Bilirubin Total: 0.5 mg/dL (ref 0.0–1.2)
CO2: 24 mmol/L (ref 20–29)
Calcium: 9.3 mg/dL (ref 8.6–10.2)
Chloride: 99 mmol/L (ref 96–106)
Creatinine, Ser: 1.14 mg/dL (ref 0.76–1.27)
Globulin, Total: 2.9 g/dL (ref 1.5–4.5)
Glucose: 177 mg/dL — ABNORMAL HIGH (ref 70–99)
Potassium: 3.8 mmol/L (ref 3.5–5.2)
Sodium: 138 mmol/L (ref 134–144)
Total Protein: 7.2 g/dL (ref 6.0–8.5)
eGFR: 73 mL/min/{1.73_m2} (ref >59)

## 2021-06-17 LAB — CBC WITH DIFFERENTIAL/PLATELET
Basophils Absolute: 0 10*3/uL (ref 0.0–0.2)
Basos: 1 %
EOS (ABSOLUTE): 0.2 10*3/uL (ref 0.0–0.4)
Eos: 2 %
Hematocrit: 47.7 % (ref 37.5–51.0)
Hemoglobin: 15.8 g/dL (ref 13.0–17.7)
Immature Grans (Abs): 0 10*3/uL (ref 0.0–0.1)
Immature Granulocytes: 0 %
Lymphocytes Absolute: 2.3 10*3/uL (ref 0.7–3.1)
Lymphs: 31 %
MCH: 27.7 pg (ref 26.6–33.0)
MCHC: 33.1 g/dL (ref 31.5–35.7)
MCV: 84 fL (ref 79–97)
Monocytes Absolute: 0.6 10*3/uL (ref 0.1–0.9)
Monocytes: 8 %
Neutrophils Absolute: 4.5 10*3/uL (ref 1.4–7.0)
Neutrophils: 58 %
Platelets: 180 10*3/uL (ref 150–450)
RBC: 5.7 x10E6/uL (ref 4.14–5.80)
RDW: 14.3 % (ref 11.6–15.4)
WBC: 7.6 10*3/uL (ref 3.4–10.8)

## 2021-06-17 LAB — LIPID PANEL W/O CHOL/HDL RATIO
Cholesterol, Total: 114 mg/dL (ref 100–199)
HDL: 25 mg/dL — ABNORMAL LOW (ref >39)
LDL Chol Calc (NIH): 47 mg/dL (ref 0–99)
Triglycerides: 268 mg/dL — ABNORMAL HIGH (ref 0–149)
VLDL Cholesterol Cal: 42 mg/dL — ABNORMAL HIGH (ref 5–40)

## 2021-06-17 LAB — VITAMIN D 25 HYDROXY (VIT D DEFICIENCY, FRACTURES): Vit D, 25-Hydroxy: 30.5 ng/mL (ref 30.0–100.0)

## 2021-06-17 LAB — TSH: TSH: 1.22 u[IU]/mL (ref 0.450–4.500)

## 2021-06-17 NOTE — Progress Notes (Signed)
Good morning crew, please let Rodney Dixon know his labs have returned: °- Kidney function, creatinine and eGFR, remains normal, as is liver function, AST and ALT.  Glucose is elevated -- PLEASE ENSURE you follow-up with Dr. Ellison ASAP. °- Cholesterol levels show at goal LDL, but triglycerides remain slightly elevated -- please focus on diet changes. °- Remainder of labs are all stable.  Any questions? °Keep being amazing!!  Thank you for allowing me to participate in your care.  I appreciate you. °Kindest regards, °Jolene

## 2021-06-23 ENCOUNTER — Other Ambulatory Visit: Payer: Self-pay | Admitting: Nurse Practitioner

## 2021-06-24 NOTE — Telephone Encounter (Signed)
Requested medications are due for refill today.  yes  Requested medications are on the active medications list.  yes  Last refill. 12/28/2020 #60 1 refill  Future visit scheduled.   yes  Notes to clinic.  Medication not delegated.    Requested Prescriptions  Pending Prescriptions Disp Refills   methocarbamol (ROBAXIN) 500 MG tablet [Pharmacy Med Name: METHOCARBAMOL 500 MG TABLET] 60 tablet 1    Sig: TAKE 1 TABLET BY MOUTH EVERY 6 HOURS AS NEEDED FOR MUSCLE SPASMS.     Not Delegated - Analgesics:  Muscle Relaxants Failed - 06/23/2021  6:09 PM      Failed - This refill cannot be delegated      Passed - Valid encounter within last 6 months    Recent Outpatient Visits           1 week ago Type 2 diabetes mellitus with proteinuria ([place])   [place], Jolene T, NP   1 month ago Type 2 diabetes mellitus with peripheral neuropathy ([place])   [place], Jolene T, NP   3 months ago Hypertension associated with type 2 diabetes mellitus ([place])   [place] Cannady, Jolene T, NP   4 months ago Type 2 diabetes mellitus with stage 3a chronic kidney disease, with long-term current use of insulin ([place])   [place], Jolene T, NP   5 months ago Chronic atrial fibrillation ([place])   [place], Barbaraann Faster, NP       Future Appointments             In 2 weeks Cannady, Barbaraann Faster, NP [facility]

## 2021-07-14 ENCOUNTER — Encounter: Payer: Self-pay | Admitting: Nurse Practitioner

## 2021-07-14 ENCOUNTER — Ambulatory Visit (INDEPENDENT_AMBULATORY_CARE_PROVIDER_SITE_OTHER): Payer: 59 | Admitting: Nurse Practitioner

## 2021-07-14 ENCOUNTER — Other Ambulatory Visit: Payer: Self-pay

## 2021-07-14 VITALS — BP 138/84 | HR 85 | Temp 98.0°F | Ht 68.0 in | Wt 241.6 lb

## 2021-07-14 DIAGNOSIS — Z952 Presence of prosthetic heart valve: Secondary | ICD-10-CM

## 2021-07-14 DIAGNOSIS — E1159 Type 2 diabetes mellitus with other circulatory complications: Secondary | ICD-10-CM

## 2021-07-14 DIAGNOSIS — R809 Proteinuria, unspecified: Secondary | ICD-10-CM

## 2021-07-14 DIAGNOSIS — Z794 Long term (current) use of insulin: Secondary | ICD-10-CM

## 2021-07-14 DIAGNOSIS — E1122 Type 2 diabetes mellitus with diabetic chronic kidney disease: Secondary | ICD-10-CM

## 2021-07-14 DIAGNOSIS — E1129 Type 2 diabetes mellitus with other diabetic kidney complication: Secondary | ICD-10-CM

## 2021-07-14 DIAGNOSIS — E1142 Type 2 diabetes mellitus with diabetic polyneuropathy: Secondary | ICD-10-CM

## 2021-07-14 DIAGNOSIS — N1831 Chronic kidney disease, stage 3a: Secondary | ICD-10-CM

## 2021-07-14 DIAGNOSIS — I152 Hypertension secondary to endocrine disorders: Secondary | ICD-10-CM

## 2021-07-14 DIAGNOSIS — R21 Rash and other nonspecific skin eruption: Secondary | ICD-10-CM

## 2021-07-14 DIAGNOSIS — D6869 Other thrombophilia: Secondary | ICD-10-CM

## 2021-07-14 LAB — COAGUCHEK XS/INR WAIVED
INR: 2.8 — ABNORMAL HIGH (ref 0.9–1.1)
Prothrombin Time: 33.8 s

## 2021-07-14 MED ORDER — SEMAGLUTIDE (1 MG/DOSE) 4 MG/3ML ~~LOC~~ SOPN: 1.0000 mg | PEN_INJECTOR | SUBCUTANEOUS | 3 refills | Status: DC

## 2021-07-14 MED ORDER — TRIAMCINOLONE ACETONIDE 0.1 % EX CREA: 1.0000 "application " | TOPICAL_CREAM | Freq: Two times a day (BID) | CUTANEOUS | 0 refills | Status: DC

## 2021-07-14 MED ORDER — BENAZEPRIL HCL 40 MG PO TABS: 40.0000 mg | ORAL_TABLET | Freq: Every day | ORAL | 4 refills | Status: DC

## 2021-07-14 NOTE — Patient Instructions (Addendum)
Dr. Everardo All -- [phone] (scheduled follow-up with him ASAP) ? ?Diabetes Mellitus and Nutrition, Adult ?When you have diabetes, or diabetes mellitus, it is very important to have healthy eating habits because your blood sugar (glucose) levels are greatly affected by what you eat and drink. Eating healthy foods in the right amounts, at about the same times every day, can help you: ?Manage your blood glucose. ?Lower your risk of heart disease. ?Improve your blood pressure. ?Reach or maintain a healthy weight. ?What can affect my meal plan? ?Every person with diabetes is different, and each person has different needs for a meal plan. Your health care provider may recommend that you work with a dietitian to make a meal plan that is best for you. Your meal plan may vary depending on factors such as: ?The calories you need. ?The medicines you take. ?Your weight. ?Your blood glucose, blood pressure, and cholesterol levels. ?Your activity level. ?Other health conditions you have, such as heart or kidney disease. ?How do carbohydrates affect me? ?Carbohydrates, also called carbs, affect your blood glucose level more than any other type of food. Eating carbs raises the amount of glucose in your blood. ?It is important to know how many carbs you can safely have in each meal. This is different for every person. Your dietitian can help you calculate how many carbs you should have at each meal and for each snack. ?How does alcohol affect me? ?Alcohol can cause a decrease in blood glucose (hypoglycemia), especially if you use insulin or take certain diabetes medicines by mouth. Hypoglycemia can be a life-threatening condition. Symptoms of hypoglycemia, such as sleepiness, dizziness, and confusion, are similar to symptoms of having too much alcohol. ?Do not drink alcohol if: ?Your health care provider tells you not to drink. ?You are pregnant, may be pregnant, or are planning to become pregnant. ?If you drink alcohol: ?Limit how  much you have to: ?0-1 drink a day for women. ?0-2 drinks a day for men. ?Know how much alcohol is in your drink. In the [place] one drink equals one 12 oz bottle of beer (355 mL), one 5 oz glass of wine (148 mL), or one 1? oz glass of hard liquor (44 mL). ?Keep yourself hydrated with water, diet soda, or unsweetened iced tea. Keep in mind that regular soda, juice, and other mixers may contain a lot of sugar and must be counted as carbs. ?What are tips for following this plan? ?Reading food labels ?Start by checking the serving size on the Nutrition Facts label of packaged foods and drinks. The number of calories and the amount of carbs, fats, and other nutrients listed on the label are based on one serving of the item. Many items contain more than one serving per package. ?Check the total grams (g) of carbs in one serving. ?Check the number of grams of saturated fats and trans fats in one serving. Choose foods that have a low amount or none of these fats. ?Check the number of milligrams (mg) of salt (sodium) in one serving. Most people should limit total sodium intake to less than 2,300 mg per day. ?Always check the nutrition information of foods labeled as "low-fat" or "nonfat." These foods may be higher in added sugar or refined carbs and should be avoided. ?Talk to your dietitian to identify your daily goals for nutrients listed on the label. ?Shopping ?Avoid buying canned, pre-made, or processed foods. These foods tend to be high in fat, sodium, and added sugar. ?Shop around the outside  edge of the grocery store. This is where you will most often find fresh fruits and vegetables, bulk grains, fresh meats, and fresh dairy products. ?Cooking ?Use low-heat cooking methods, such as baking, instead of high-heat cooking methods, such as deep frying. ?Cook using healthy oils, such as olive, canola, or sunflower oil. ?Avoid cooking with butter, cream, or high-fat meats. ?Meal planning ?Eat meals and snacks regularly,  preferably at the same times every day. Avoid going long periods of time without eating. ?Eat foods that are high in fiber, such as fresh fruits, vegetables, beans, and whole grains. ?Eat 4-6 oz (112-168 g) of lean protein each day, such as lean meat, chicken, fish, eggs, or tofu. One ounce (oz) (28 g) of lean protein is equal to: ?1 oz (28 g) of meat, chicken, or fish. ?1 egg. ?? cup (62 g) of tofu. ?Eat some foods each day that contain healthy fats, such as avocado, nuts, seeds, and fish. ?What foods should I eat? ?Fruits ?Berries. Apples. Oranges. Peaches. Apricots. Plums. Grapes. Mangoes. Papayas. Pomegranates. Kiwi. Cherries. ?Vegetables ?Leafy greens, including lettuce, spinach, kale, chard, collard greens, mustard greens, and cabbage. Beets. Cauliflower. Broccoli. Carrots. Green beans. Tomatoes. Peppers. Onions. Cucumbers. Brussels sprouts. ?Grains ?Whole grains, such as whole-wheat or whole-grain bread, crackers, tortillas, cereal, and pasta. Unsweetened oatmeal. Quinoa. Brown or wild rice. ?Meats and other proteins ?Seafood. Poultry without skin. Lean cuts of poultry and beef. Tofu. Nuts. Seeds. ?Dairy ?Low-fat or fat-free dairy products such as milk, yogurt, and cheese. ?The items listed above may not be a complete list of foods and beverages you can eat and drink. Contact a dietitian for more information. ?What foods should I avoid? ?Fruits ?Fruits canned with syrup. ?Vegetables ?Canned vegetables. Frozen vegetables with butter or cream sauce. ?Grains ?Refined white flour and flour products such as bread, pasta, snack foods, and cereals. Avoid all processed foods. ?Meats and other proteins ?Fatty cuts of meat. Poultry with skin. Breaded or fried meats. Processed meat. Avoid saturated fats. ?Dairy ?Full-fat yogurt, cheese, or milk. ?Beverages ?Sweetened drinks, such as soda or iced tea. ?The items listed above may not be a complete list of foods and beverages you should avoid. Contact a dietitian for more  information. ?Questions to ask a health care provider ?Do I need to meet with a certified diabetes care and education specialist? ?Do I need to meet with a dietitian? ?What number can I call if I have questions? ?When are the best times to check my blood glucose? ?Where to find more information: ?American Diabetes Association: diabetes.org ?Academy of Nutrition and Dietetics: eatright.org ?[employer] of Diabetes and Digestive and Kidney Diseases: AmenCredit.is ?Association of Diabetes Care & Education Specialists: diabeteseducator.org ?Summary ?It is important to have healthy eating habits because your blood sugar (glucose) levels are greatly affected by what you eat and drink. It is important to use alcohol carefully. ?A healthy meal plan will help you manage your blood glucose and lower your risk of heart disease. ?Your health care provider may recommend that you work with a dietitian to make a meal plan that is best for you. ?This information is not intended to replace advice given to you by your health care provider. Make sure you discuss any questions you have with your health care provider. ?Document Revised: 11/27/2019 Document Reviewed: 11/27/2019 ?Elsevier Patient Education ? [place]. ? ?

## 2021-07-14 NOTE — Assessment & Plan Note (Signed)
Chronic, ongoing with Coumadin use.  INR today 2.8, at goal 2.5 to 3.5. Elected to continue Coumadin 10 MG daily.  Return in 4 weeks for INR check.  Return sooner to office if any bleeding or concerns. ?

## 2021-07-14 NOTE — Progress Notes (Signed)
? ?BP 138/84 (BP Location: Left Arm, Patient Position: Sitting, Cuff Size: Large)   Pulse 85   Temp 98 ?F (36.7 ?C) (Oral)   Ht 5\' 8"  (1.727 m)   Wt 241 lb 9.6 oz (109.6 kg)   SpO2 96%   BMI 36.74 kg/m?   ? ?Subjective:  ? ? Patient ID: Rodney CURTAIN [name], male    DOB: 1961-02-06, 61 y.o.   MRN: [id number] ? ?CC: Coumadin management ? ?HPI: This patient is a 61 y.o. male who presents for coumadin management. The expected duration of coumadin treatment is lifelong The reason for anticoagulation is  mechanical heart valve. Continues on Coumadin 10 MG daily dosing. ? ?Present Coumadin dose: 10 MG daily ?Goal: 2.5-3.5 The patient does not have an active anticoagulation episode. ?Excessive bruising: no ?Nose bleeding: no ?Rectal bleeding: no ?Prolonged menstrual cycles: N/A ?Eating diet with consistent amounts of foods containing Vitamin K:yes ?Any recent antibiotic use? no ? ?FOOT RASH ?Started one week ago to right foot.  No pain or changes to area.  He did place a bandage over area at one point. ?Duration: weeks ?Involved foot: right ?Mechanism of injury: unknown ?Location: top of right foot ?Onset: sudden  ?Status: stable ?Treatments attempted: ointment and bandage  ?Relief with NSAIDs?:  No NSAIDs Taken ?Weakness with weight bearing or walking: no ?Morning stiffness: no ?Swelling: no ?Redness: no ?Bruising: no ?Paresthesias / decreased sensation: no  ?Fevers:no  ? ?Relevant past medical, surgical, family and social history reviewed and updated as indicated. Interim medical history since our last visit reviewed. ?Allergies and medications reviewed and updated. ? ?Review of Systems  ?Constitutional:  Negative for diaphoresis, fever, malaise/fatigue and weight loss.  ?HENT: Negative.    ?Respiratory: Negative.    ?Cardiovascular: Negative.   ?Neurological:  Negative for dizziness, tingling, speech change, focal weakness, seizures, loss of consciousness, weakness and headaches.  ?Psychiatric/Behavioral: Negative.      ? ?   ?Objective:  ?  ?BP 138/84 (BP Location: Left Arm, Patient Position: Sitting, Cuff Size: Large)   Pulse 85   Temp 98 ?F (36.7 ?C) (Oral)   Ht 5\' 8"  (1.727 m)   Wt 241 lb 9.6 oz (109.6 kg)   SpO2 96%   BMI 36.74 kg/m?   ?Wt Readings from Last 3 Encounters:  ?07/14/21 241 lb 9.6 oz (109.6 kg)  ?06/16/21 247 lb 3.2 oz (112.1 kg)  ?04/27/21 247 lb 3.2 oz (112.1 kg)  ?  ? ?Physical Exam ?Vitals and nursing note reviewed.  ?Constitutional:   ?   General: He is awake. He is not in acute distress. ?   Appearance: He is well-groomed. He is obese. He is not ill-appearing or toxic-appearing.  ?HENT:  ?   Head: Normocephalic.  ?   Right Ear: Hearing, tympanic membrane, ear canal and external ear normal.  ?   Left Ear: Hearing, tympanic membrane, ear canal and external ear normal.  ?Eyes:  ?   General: Lids are normal.  ?   Extraocular Movements: Extraocular movements intact.  ?   Conjunctiva/sclera: Conjunctivae normal.  ?   Pupils: Pupils are equal, round, and reactive to light.  ?   Right eye: Pupil is not sluggish.  ?   Left eye: Pupil is not sluggish.  ?   Visual Fields: Right eye visual fields normal and left eye visual fields normal.  ?Neck:  ?   Thyroid: No thyromegaly.  ?   Vascular: No carotid bruit.  ?Cardiovascular:  ?  Rate and Rhythm: Normal rate and regular rhythm.  ?   Heart sounds: Normal heart sounds. No murmur heard. ?  No friction rub.  ?Pulmonary:  ?   Effort: No accessory muscle usage or respiratory distress.  ?Abdominal:  ?   General: There is no distension.  ?   Palpations: Abdomen is soft.  ?   Tenderness: There is no abdominal tenderness.  ?Musculoskeletal:  ?   Right lower leg: No edema.  ?   Left lower leg: No edema.  ?Lymphadenopathy:  ?   Head:  ?   Right side of head: No submental, submandibular, tonsillar, preauricular or posterior auricular adenopathy.  ?   Left side of head: No submental, submandibular, tonsillar, preauricular or posterior auricular adenopathy.  ?   Cervical: No  cervical adenopathy.  ?Skin: ?   General: Skin is warm and dry.  ?   Capillary Refill: Capillary refill takes less than 2 seconds.  ? ?    ?Neurological:  ?   Mental Status: He is alert and oriented to person, place, and time.  ?   Cranial Nerves: Cranial nerves 2-12 are intact.  ?   Motor: Motor function is intact. No weakness.  ?   Coordination: Coordination is intact. Romberg sign negative.  ?   Gait: Gait is intact.  ?   Deep Tendon Reflexes:  ?   Reflex Scores: ?     Brachioradialis reflexes are 1+ on the right side and 1+ on the left side. ?     Patellar reflexes are 1+ on the right side and 1+ on the left side. ?Psychiatric:     ?   Behavior: Behavior is cooperative.  ?  ?Last INR: 2.6 ? ?Last CBC:  ?Lab Results  ?Component Value Date  ? WBC 7.6 06/16/2021  ? HGB 15.8 06/16/2021  ? HCT 47.7 06/16/2021  ? MCV 84 06/16/2021  ? PLT 180 06/16/2021  ? ? ?Results for orders placed or performed in visit on 07/14/21  ?CoaguChek XS/INR Waived  ?Result Value Ref Range  ? INR 2.8 (H) 0.9 - 1.1  ? Prothrombin Time 33.8 sec  ? ?   ? ?Assessment:  ? ?  ICD-10-CM   ?1. Acquired thrombophilia ([place])  D68.69   ?  ?2. Rash  R21 Ambulatory referral to Dermatology  ?  ?3. History of mechanical aortic valve replacement  Z95.2 CoaguChek XS/INR Waived  ?  ?4. Hypertension associated with type 2 diabetes mellitus (HCC)  E11.59 benazepril (LOTENSIN) 40 MG tablet  ? I15.2   ? Refills on medication sent  ?  ?5. Type 2 diabetes mellitus with proteinuria (HCC)  E11.29 Semaglutide, 1 MG/DOSE, 4 MG/3ML SOPN  ? R80.9   ?  ?6. Type 2 diabetes mellitus with stage 3a chronic kidney disease, with long-term current use of insulin (HCC)  E11.22 Semaglutide, 1 MG/DOSE, 4 MG/3ML SOPN  ? N18.31   ? Z79.4   ?  ?7. Type 2 diabetes mellitus with peripheral neuropathy (HCC)  E11.42 Semaglutide, 1 MG/DOSE, 4 MG/3ML SOPN  ?  ? ? ?Plan:  ? ?Discussed current plan face-to-face with patient. For coumadin dosing, elected to maintain 10 MG daily. Will plan to  recheck INR in 4 weeks. ?

## 2021-07-14 NOTE — Assessment & Plan Note (Signed)
To top of right foot, internal aspect appears like eczema, but external is irritation and allergic appearance from placing bandage over area.  Recommend against bandage use.  Triamcinolone cream sent in and referral to dermatology for further assessment. ?

## 2021-07-14 NOTE — Assessment & Plan Note (Signed)
Monitor CBC with use of daily Coumadin for mechanical valve and a-fib.  Denies any current bleeding episodes.  Check CBC next visit. 

## 2021-07-27 ENCOUNTER — Ambulatory Visit (INDEPENDENT_AMBULATORY_CARE_PROVIDER_SITE_OTHER): Payer: 59 | Admitting: Unknown Physician Specialty

## 2021-07-27 ENCOUNTER — Other Ambulatory Visit: Payer: Self-pay

## 2021-07-27 ENCOUNTER — Encounter: Payer: Self-pay | Admitting: Unknown Physician Specialty

## 2021-07-27 VITALS — BP 116/79 | HR 82 | Temp 97.4°F | Wt 247.0 lb

## 2021-07-27 DIAGNOSIS — H60312 Diffuse otitis externa, left ear: Secondary | ICD-10-CM | POA: Diagnosis not present

## 2021-07-27 MED ORDER — NEOMYCIN-POLYMYXIN-HC 3.5-10000-1 OT SOLN: 4.0000 [drp] | Freq: Four times a day (QID) | OTIC | 0 refills | Status: DC

## 2021-07-27 NOTE — Progress Notes (Signed)
? ?BP 116/79   Pulse 82   Temp (!) 97.4 ?F (36.3 ?C)   Wt 247 lb (112 kg)   SpO2 96%   BMI 37.56 kg/m?   ? ?Subjective:  ? ? Patient ID: Rodney Dixon [name], male    DOB: 07-10-1960, 61 y.o.   MRN: [id number] ? ?HPI: ?Rodney Dixon. is a 61 y.o. male ? ?Chief Complaint  ?Patient presents with  ? Ear Pain  ?  Patient states he has left ear pain for 3 days   ? ?Otalgia  ?There is pain in the left ear. This is a new problem. Episode onset: 3 days. The problem occurs constantly. The problem has been unchanged. There has been no fever. The pain is mild. Pertinent negatives include no coughing, ear discharge, headaches, neck pain, rhinorrhea or sore throat. He has tried nothing for the symptoms. Improvement on treatment: Worsening with cold air.  ? ? ?Relevant past medical, surgical, family and social history reviewed and updated as indicated. Interim medical history since our last visit reviewed. ?Allergies and medications reviewed and updated. ? ?Review of Systems  ?HENT:  Positive for ear pain. Negative for ear discharge, rhinorrhea and sore throat.   ?Respiratory:  Negative for cough.   ?Musculoskeletal:  Negative for neck pain.  ?Neurological:  Negative for headaches.  ? ?Per HPI unless specifically indicated above ? ?   ?Objective:  ?  ?BP 116/79   Pulse 82   Temp (!) 97.4 ?F (36.3 ?C)   Wt 247 lb (112 kg)   SpO2 96%   BMI 37.56 kg/m?   ?Wt Readings from Last 3 Encounters:  ?07/27/21 247 lb (112 kg)  ?07/14/21 241 lb 9.6 oz (109.6 kg)  ?06/16/21 247 lb 3.2 oz (112.1 kg)  ?  ?Physical Exam ?Constitutional:   ?   General: He is not in acute distress. ?   Appearance: Normal appearance. He is well-developed.  ?HENT:  ?   Head: Normocephalic and atraumatic.  ?   Right Ear: Tympanic membrane, ear canal and external ear normal.  ?   Left Ear: Tympanic membrane and external ear normal.  ?   Ears:  ?   Comments: Some inflammation left ear canal ?Eyes:  ?   General: Lids are normal. No scleral icterus.    ?   Right  eye: No discharge.     ?   Left eye: No discharge.  ?   Conjunctiva/sclera: Conjunctivae normal.  ?Neck:  ?   Vascular: No carotid bruit or JVD.  ?Cardiovascular:  ?   Rate and Rhythm: Normal rate.  ?Pulmonary:  ?   Effort: No respiratory distress.  ?Abdominal:  ?   Palpations: There is no hepatomegaly or splenomegaly.  ?Musculoskeletal:     ?   General: Normal range of motion.  ?   Cervical back: Normal range of motion and neck supple.  ?Skin: ?   General: Skin is warm and dry.  ?   Coloration: Skin is not pale.  ?   Findings: No rash.  ?Neurological:  ?   Mental Status: He is alert and oriented to person, place, and time.  ?Psychiatric:     ?   Behavior: Behavior normal.     ?   Thought Content: Thought content normal.     ?   Judgment: Judgment normal.  ? ? ?Results for orders placed or performed in visit on 07/14/21  ?CoaguChek XS/INR Waived  ?Result Value Ref Range  ? INR  2.8 (H) 0.9 - 1.1  ? Prothrombin Time 33.8 sec  ? ?   ?Assessment & Plan:  ? ?Problem List Items Addressed This Visit   ?None ?Visit Diagnoses   ? ? Acute diffuse otitis externa of left ear    -  Primary  ? Pt with some irritation of left canal.  Rx for corticoportin ear drops to use prn.  Pt ed on not using q tips  ? ?  ?  ? ?Follow up plan: ?Return if symptoms worsen or fail to improve. ? ? ? ? ? ?

## 2021-08-07 DIAGNOSIS — Z7901 Long term (current) use of anticoagulants: Secondary | ICD-10-CM | POA: Insufficient documentation

## 2021-08-07 NOTE — Patient Instructions (Addendum)
Start 9 MG two days a week and 10 MG remainder.  Hold Coumadin tonight. ? ?[place] [facility] Drilling ASAP AT [phone] ? ?Vitamin K Foods and Warfarin ?Warfarin is a blood thinner (anticoagulant). Anticoagulant medicines help prevent blood clots from forming or getting bigger. Warfarin works by blocking the activity of vitamin K. Vitamin K promotes normal blood clotting. ?When you take warfarin, problems can occur from suddenly increasing or decreasing the amount of vitamin K that you eat from one day to the next. These problems can occur due to varying levels of warfarin in your blood. Problems may include blood clots or bleeding. ?What are tips for eating the right amount of vitamin K? ?Reading food labels ?Know which foods contain vitamin K. Read food labels. Use the lists below to understand serving sizes and the amount of vitamin K in one serving. ?If you take a multivitamin that contains vitamin K, be sure to take it every day. ?Meal planning ?To avoid problems when taking warfarin: ?Eat a balanced diet that includes: ?Fresh fruits and vegetables. ?Whole grains. ?Low-fat dairy products. ?Lean proteins, such as fish, eggs, and lean cuts of meat. ?Avoid major changes in your diet. If you are going to change your diet, talk with your health care provider before making changes. ?Keep your intake of vitamin K consistent from day to day. Avoid eating large amounts of vitamin K one day and small amounts of vitamin K the next day. ?Work with a dietitian to develop a meal plan that works best for you. ? ?What foods are high in vitamin K? ?Foods that are high in vitamin K contain more than 100 mcg (micrograms) per serving. These include: ?Broccoli (cooked from fresh) - ? cup (78 g) has 110 mcg. ?Brussels sprouts (cooked from fresh) - ? cup (78 g) has 109 mcg. ?Greens, beet (cooked from fresh) - ? cup (72 g) has 350 mcg. ?Greens, collard (cooked from fresh) - ? cup (66 g) has 263 mcg. ?Greens, turnip (cooked from  fresh) - ? cup (72 g) has 265 mcg. ?Green onions or scallions - ? cup (50 g) has 105 mcg. ?Kale (cooked from fresh) - ? cup (68 g) has 536 mcg. ?Parsley (raw) - 10 sprigs (10 g) has 164 mcg. ?Spinach (cooked from fresh) - ? cup (90 g) has 444 mcg. ?Swiss chard (cooked from fresh) - ? cup (88 g) has 287 mcg. ?The items listed above may not be a complete list of foods high in Vitamin K. Actual amounts of Vitamin K may differ depending on processing. Contact a dietitian for more information. ?What foods have a moderate amount of vitamin K? ?Foods that have a moderate amount of vitamin K contain 25-100 mcg per serving. These include: ?Asparagus (cooked from fresh) - 4 spears (60 g) have 30 mcg. ?Black-eyed peas (dried) - ? cup (85 g) has 32 mcg. ?Cabbage (cooked from fresh) - ? cup (78 g) has 84 mcg. ?Cabbage (raw) - ? cup (35 g) has 26 mcg. ?Kiwi fruit - 1 medium (69 g) has 27 mcg. ?Lettuce (raw) - 1 cup (36 g) has 45 mcg. ?Okra (cooked from fresh) - ? cup (80 g) has 32 mcg. ?Prunes (dried) - 5 prunes (47 g) have 25 mcg. ?Tuna, light, canned in oil - 3 oz (85 g) has 37 mcg. ?Watercress (raw) - 1 cup (34 g) has 85 mcg. ?The items listed above may not be a complete list of foods with a moderate amount of Vitamin K. Actual  amounts of Vitamin K may differ depending on processing. Contact a dietitian for more information. ?What foods are low in vitamin K? ?Foods low in vitamin K contain less than 25 mcg per serving. These include: ?Artichoke - 1 medium (128 g) has 18 mcg. ?Avocado - 1 oz (21 g) has 6 mcg. ?Blueberries - ? cup (73 g) has 14 mcg. ?Carrots (cooked from fresh) - ? cup (78 g) has 11 mcg. ?Cauliflower (raw) - ? cup (54 g) has 8 mcg. ?Cucumber with peel (raw) - ? cup (52 g) has 9 mcg. ?Grapes - ? cup (76 g) has 12 mcg. ?Mango - 1 medium (207 g) has 9 mcg. ?Mixed nuts - 1 cup (142 g) has 17 mcg. ?Pear - 1 medium (178 g) has 8 mcg. ?Peas (cooked from fresh) - ? cup (80 g) has 20 mcg. ?Pickled cucumber - 1 spear (65  g) has 11 mcg. ?Sauerkraut (canned) - ? cup (118 g) has 16 mcg. ?Soybeans (cooked from fresh) - ? cup (86 g) has 16 mcg. ?Tomato (raw) - 1 medium (123 g) has 10 mcg. ?Tomato sauce (raw) - ? cup (123 g) has 17 mcg. ?The items listed above may not be a complete list of foods low in Vitamin K. Actual amounts of Vitamin K may differ depending on processing. Contact a dietitian for more information. ?What foods do not have vitamin K? ?If a food contains less than 5 mcg per serving, it is considered to have no vitamin K. These foods include: ?Bread and cereal products. ?Cheese. ?Eggs. ?Fish and shellfish. ?Meat and poultry. ?Milk and dairy products. ?Seeds, such as sunflower or pumpkin seeds. ?The items listed above may not be a complete list of foods that do not have vitamin K. Actual amounts of vitamin K may differ depending on processing. Contact a dietitian for more information. ?Summary ?Warfarin is an anticoagulant that prevents blood clots from forming or getting bigger by blocking the activity of vitamin K. ?It is important to know the amount of vitamin K that is in the foods you eat and to keep your intake of vitamin K consistent from day to day. ?Avoid major changes in your diet. If you are going to change your diet, talk with your health care provider before making changes. ?This information is not intended to replace advice given to you by your health care provider. Make sure you discuss any questions you have with your health care provider. ?Document Revised: 07/01/2020 Document Reviewed: 07/01/2020 ?Elsevier Patient Education ? [place]. ? ?

## 2021-08-11 ENCOUNTER — Encounter: Payer: Self-pay | Admitting: Nurse Practitioner

## 2021-08-11 ENCOUNTER — Ambulatory Visit (INDEPENDENT_AMBULATORY_CARE_PROVIDER_SITE_OTHER): Payer: 59 | Admitting: Nurse Practitioner

## 2021-08-11 VITALS — BP 124/77 | HR 91 | Temp 98.1°F | Wt 242.6 lb

## 2021-08-11 DIAGNOSIS — Z7901 Long term (current) use of anticoagulants: Secondary | ICD-10-CM | POA: Diagnosis not present

## 2021-08-11 DIAGNOSIS — Z952 Presence of prosthetic heart valve: Secondary | ICD-10-CM

## 2021-08-11 NOTE — Assessment & Plan Note (Signed)
Refer to mechanical valve plan of care. 

## 2021-08-11 NOTE — Progress Notes (Signed)
? ?BP 124/77   Pulse 91   Temp 98.1 ?F (36.7 ?C)   Wt 242 lb 9.6 oz (110 kg)   SpO2 93%   BMI 36.89 kg/m?   ? ?Subjective:  ? ? Patient ID: Rodney BECHERER [name], male    DOB: 02-28-1961, 61 y.o.   MRN: [id number] ? ?CC: Coumadin management ? ?HPI: This patient is a 61 y.o. male who presents for coumadin management. The expected duration of coumadin treatment is lifelong The reason for anticoagulation is  mechanical heart valve. Continues on Coumadin 10 MG daily dosing. ? ?Present Coumadin dose: 10 MG daily ?Goal: 2.5-3.5 The patient does not have an active anticoagulation episode. ?Excessive bruising: no ?Nose bleeding: no ?Rectal bleeding: no ?Prolonged menstrual cycles: N/A ?Eating diet with consistent amounts of foods containing Vitamin K:yes ?Any recent antibiotic use? no ? ?Relevant past medical, surgical, family and social history reviewed and updated as indicated. Interim medical history since our last visit reviewed. ?Allergies and medications reviewed and updated. ? ?Review of Systems  ?Constitutional:  Negative for diaphoresis, fever, malaise/fatigue and weight loss.  ?HENT: Negative.    ?Respiratory: Negative.    ?Cardiovascular: Negative.   ?Neurological:  Negative for dizziness, tingling, speech change, focal weakness, seizures, loss of consciousness, weakness and headaches.  ?Psychiatric/Behavioral: Negative.     ? ?   ?Objective:  ?  ?BP 124/77   Pulse 91   Temp 98.1 ?F (36.7 ?C)   Wt 242 lb 9.6 oz (110 kg)   SpO2 93%   BMI 36.89 kg/m?   ?Wt Readings from Last 3 Encounters:  ?08/11/21 242 lb 9.6 oz (110 kg)  ?07/27/21 247 lb (112 kg)  ?07/14/21 241 lb 9.6 oz (109.6 kg)  ?  ? ?Physical Exam ?Vitals and nursing note reviewed.  ?Constitutional:   ?   General: He is awake. He is not in acute distress. ?   Appearance: He is well-groomed. He is obese. He is not ill-appearing or toxic-appearing.  ?HENT:  ?   Head: Normocephalic.  ?   Right Ear: Hearing, tympanic membrane, ear canal and external ear  normal.  ?   Left Ear: Hearing, tympanic membrane, ear canal and external ear normal.  ?Eyes:  ?   General: Lids are normal.  ?   Extraocular Movements: Extraocular movements intact.  ?   Conjunctiva/sclera: Conjunctivae normal.  ?   Pupils: Pupils are equal, round, and reactive to light.  ?   Right eye: Pupil is not sluggish.  ?   Left eye: Pupil is not sluggish.  ?   Visual Fields: Right eye visual fields normal and left eye visual fields normal.  ?Neck:  ?   Thyroid: No thyromegaly.  ?   Vascular: No carotid bruit.  ?Cardiovascular:  ?   Rate and Rhythm: Normal rate and regular rhythm.  ?   Heart sounds: Normal heart sounds. No murmur heard. ?  No friction rub.  ?Pulmonary:  ?   Effort: No accessory muscle usage or respiratory distress.  ?Abdominal:  ?   General: There is no distension.  ?   Palpations: Abdomen is soft.  ?   Tenderness: There is no abdominal tenderness.  ?Musculoskeletal:  ?   Right lower leg: No edema.  ?   Left lower leg: No edema.  ?Lymphadenopathy:  ?   Head:  ?   Right side of head: No submental, submandibular, tonsillar, preauricular or posterior auricular adenopathy.  ?   Left side of head: No submental,  submandibular, tonsillar, preauricular or posterior auricular adenopathy.  ?   Cervical: No cervical adenopathy.  ?Skin: ?   General: Skin is warm and dry.  ?   Capillary Refill: Capillary refill takes less than 2 seconds.  ?Neurological:  ?   Mental Status: He is alert and oriented to person, place, and time.  ?   Cranial Nerves: Cranial nerves 2-12 are intact.  ?   Motor: Motor function is intact. No weakness.  ?   Coordination: Coordination is intact. Romberg sign negative.  ?   Gait: Gait is intact.  ?   Deep Tendon Reflexes:  ?   Reflex Scores: ?     Brachioradialis reflexes are 1+ on the right side and 1+ on the left side. ?     Patellar reflexes are 1+ on the right side and 1+ on the left side. ?Psychiatric:     ?   Attention and Perception: Attention normal.     ?   Mood and  Affect: Mood normal.     ?   Speech: Speech normal.     ?   Behavior: Behavior normal. Behavior is cooperative.     ?   Thought Content: Thought content normal.  ?  ?Last INR: 2.8 ? ?Last CBC:  ?Lab Results  ?Component Value Date  ? WBC 7.6 06/16/2021  ? HGB 15.8 06/16/2021  ? HCT 47.7 06/16/2021  ? MCV 84 06/16/2021  ? PLT 180 06/16/2021  ? ? ?Results for orders placed or performed in visit on 07/14/21  ?CoaguChek XS/INR Waived  ?Result Value Ref Range  ? INR 2.8 (H) 0.9 - 1.1  ? Prothrombin Time 33.8 sec  ? ?   ? ?Assessment:  ? ?  ICD-10-CM   ?1. History of mechanical aortic valve replacement  Z95.2 CoaguChek XS/INR Waived  ?  ?2. Current use of long term anticoagulation  Z79.01 CoaguChek XS/INR Waived  ?  ? ? ?Plan:  ? ?Discussed current plan face-to-face with patient. For coumadin dosing, elected to change to 9 MG two days a week and 10 MG remainder of days, hold tonight. Will plan to recheck INR in 2 weeks. ?

## 2021-08-11 NOTE — Assessment & Plan Note (Signed)
Chronic, ongoing with Coumadin use.  INR today 4.1, above goal 2.5 to 3.5. Elected to change to 9 MG two days a week and 10 MG remainder of days, hold tonight.  Return in 2 weeks for INR check.  Return sooner to office if any bleeding or concerns. ?

## 2021-08-12 LAB — COAGUCHEK XS/INR WAIVED
INR: 4.1 — ABNORMAL HIGH (ref 0.9–1.1)
Prothrombin Time: 48.7 s

## 2021-08-23 ENCOUNTER — Other Ambulatory Visit: Payer: Self-pay | Admitting: Nurse Practitioner

## 2021-08-24 NOTE — Telephone Encounter (Signed)
Requested Prescriptions  ?Pending Prescriptions Disp Refills  ?? atorvastatin (LIPITOR) 80 MG tablet [Pharmacy Med Name: ATORVASTATIN 80 MG TABLET] 90 tablet 0  ?  Sig: TAKE 1 TABLET (80 MG TOTAL) BY MOUTH DAILY. STOP 40 MG ATORVASTATIN AND START THIS DOSE.  ?  ? Cardiovascular:  Antilipid - Statins Failed - 08/23/2021 12:27 PM  ?  ?  Failed - Lipid Panel in normal range within the last 12 months  ?  Cholesterol, Total  ?Date Value Ref Range Status  ?06/16/2021 114 100 - 199 mg/dL Final  ? ?LDL Chol Calc (NIH)  ?Date Value Ref Range Status  ?06/16/2021 47 0 - 99 mg/dL Final  ? ?HDL  ?Date Value Ref Range Status  ?06/16/2021 25 (L) >39 mg/dL Final  ? ?Triglycerides  ?Date Value Ref Range Status  ?06/16/2021 268 (H) 0 - 149 mg/dL Final  ? ?  ?  ?  Passed - Patient is not pregnant  ?  ?  Passed - Valid encounter within last 12 months  ?  Recent Outpatient Visits   ?      ? 1 week ago History of mechanical aortic valve replacement  ? [facility] [place], [place] T, NP  ? 4 weeks ago Acute diffuse otitis externa of left ear  ? [facility] Kathrine Haddock, NP  ? 1 month ago Acquired thrombophilia ([place])  ? [place], [place] T, NP  ? 2 months ago Type 2 diabetes mellitus with proteinuria ([place])  ? [place], [place] T, NP  ? 3 months ago Type 2 diabetes mellitus with peripheral neuropathy ([place])  ? [facility] [place], Henrine Screws T, NP  ?  ?  ?Future Appointments   ?        ? In 3 days Venita Lick, NP [employer]  ?  ? ?  ?  ?  ? ?

## 2021-08-26 NOTE — Patient Instructions (Addendum)
Call neurology at [facility] -- [phone] ? ?Vitamin K Foods and Warfarin ?Warfarin is a blood thinner (anticoagulant). Anticoagulant medicines help prevent blood clots from forming or getting bigger. Warfarin works by blocking the activity of vitamin K. Vitamin K promotes normal blood clotting. ?When you take warfarin, problems can occur from suddenly increasing or decreasing the amount of vitamin K that you eat from one day to the next. These problems can occur due to varying levels of warfarin in your blood. Problems may include blood clots or bleeding. ?What are tips for eating the right amount of vitamin K? ?Reading food labels ?Know which foods contain vitamin K. Read food labels. Use the lists below to understand serving sizes and the amount of vitamin K in one serving. ?If you take a multivitamin that contains vitamin K, be sure to take it every day. ?Meal planning ?To avoid problems when taking warfarin: ?Eat a balanced diet that includes: ?Fresh fruits and vegetables. ?Whole grains. ?Low-fat dairy products. ?Lean proteins, such as fish, eggs, and lean cuts of meat. ?Avoid major changes in your diet. If you are going to change your diet, talk with your health care provider before making changes. ?Keep your intake of vitamin K consistent from day to day. Avoid eating large amounts of vitamin K one day and small amounts of vitamin K the next day. ?Work with a dietitian to develop a meal plan that works best for you. ? ?What foods are high in vitamin K? ?Foods that are high in vitamin K contain more than 100 mcg (micrograms) per serving. These include: ?Broccoli (cooked from fresh) - ? cup (78 g) has 110 mcg. ?Brussels sprouts (cooked from fresh) - ? cup (78 g) has 109 mcg. ?Greens, beet (cooked from fresh) - ? cup (72 g) has 350 mcg. ?Greens, collard (cooked from fresh) - ? cup (66 g) has 263 mcg. ?Greens, turnip (cooked from fresh) - ? cup (72 g) has 265 mcg. ?Green onions or scallions - ? cup (50  g) has 105 mcg. ?Kale (cooked from fresh) - ? cup (68 g) has 536 mcg. ?Parsley (raw) - 10 sprigs (10 g) has 164 mcg. ?Spinach (cooked from fresh) - ? cup (90 g) has 444 mcg. ?Swiss chard (cooked from fresh) - ? cup (88 g) has 287 mcg. ?The items listed above may not be a complete list of foods high in Vitamin K. Actual amounts of Vitamin K may differ depending on processing. Contact a dietitian for more information. ?What foods have a moderate amount of vitamin K? ?Foods that have a moderate amount of vitamin K contain 25-100 mcg per serving. These include: ?Asparagus (cooked from fresh) - 4 spears (60 g) have 30 mcg. ?Black-eyed peas (dried) - ? cup (85 g) has 32 mcg. ?Cabbage (cooked from fresh) - ? cup (78 g) has 84 mcg. ?Cabbage (raw) - ? cup (35 g) has 26 mcg. ?Kiwi fruit - 1 medium (69 g) has 27 mcg. ?Lettuce (raw) - 1 cup (36 g) has 45 mcg. ?Okra (cooked from fresh) - ? cup (80 g) has 32 mcg. ?Prunes (dried) - 5 prunes (47 g) have 25 mcg. ?Tuna, light, canned in oil - 3 oz (85 g) has 37 mcg. ?Watercress (raw) - 1 cup (34 g) has 85 mcg. ?The items listed above may not be a complete list of foods with a moderate amount of Vitamin K. Actual amounts of Vitamin K may differ depending on processing. Contact a dietitian for more information. ?What  foods are low in vitamin K? ?Foods low in vitamin K contain less than 25 mcg per serving. These include: ?Artichoke - 1 medium (128 g) has 18 mcg. ?Avocado - 1 oz (21 g) has 6 mcg. ?Blueberries - ? cup (73 g) has 14 mcg. ?Carrots (cooked from fresh) - ? cup (78 g) has 11 mcg. ?Cauliflower (raw) - ? cup (54 g) has 8 mcg. ?Cucumber with peel (raw) - ? cup (52 g) has 9 mcg. ?Grapes - ? cup (76 g) has 12 mcg. ?Mango - 1 medium (207 g) has 9 mcg. ?Mixed nuts - 1 cup (142 g) has 17 mcg. ?Pear - 1 medium (178 g) has 8 mcg. ?Peas (cooked from fresh) - ? cup (80 g) has 20 mcg. ?Pickled cucumber - 1 spear (65 g) has 11 mcg. ?Sauerkraut (canned) - ? cup (118 g) has 16 mcg. ?Soybeans  (cooked from fresh) - ? cup (86 g) has 16 mcg. ?Tomato (raw) - 1 medium (123 g) has 10 mcg. ?Tomato sauce (raw) - ? cup (123 g) has 17 mcg. ?The items listed above may not be a complete list of foods low in Vitamin K. Actual amounts of Vitamin K may differ depending on processing. Contact a dietitian for more information. ?What foods do not have vitamin K? ?If a food contains less than 5 mcg per serving, it is considered to have no vitamin K. These foods include: ?Bread and cereal products. ?Cheese. ?Eggs. ?Fish and shellfish. ?Meat and poultry. ?Milk and dairy products. ?Seeds, such as sunflower or pumpkin seeds. ?The items listed above may not be a complete list of foods that do not have vitamin K. Actual amounts of vitamin K may differ depending on processing. Contact a dietitian for more information. ?Summary ?Warfarin is an anticoagulant that prevents blood clots from forming or getting bigger by blocking the activity of vitamin K. ?It is important to know the amount of vitamin K that is in the foods you eat and to keep your intake of vitamin K consistent from day to day. ?Avoid major changes in your diet. If you are going to change your diet, talk with your health care provider before making changes. ?This information is not intended to replace advice given to you by your health care provider. Make sure you discuss any questions you have with your health care provider. ?Document Revised: 07/01/2020 Document Reviewed: 07/01/2020 ?Elsevier Patient Education ? 2023 Elsevier Inc. ? ?

## 2021-08-27 ENCOUNTER — Ambulatory Visit (INDEPENDENT_AMBULATORY_CARE_PROVIDER_SITE_OTHER): Payer: 59 | Admitting: Nurse Practitioner

## 2021-08-27 ENCOUNTER — Encounter: Payer: Self-pay | Admitting: Nurse Practitioner

## 2021-08-27 VITALS — BP 117/73 | HR 93 | Temp 98.4°F | Ht 68.0 in | Wt 238.8 lb

## 2021-08-27 DIAGNOSIS — Z7901 Long term (current) use of anticoagulants: Secondary | ICD-10-CM | POA: Diagnosis not present

## 2021-08-27 DIAGNOSIS — E1129 Type 2 diabetes mellitus with other diabetic kidney complication: Secondary | ICD-10-CM

## 2021-08-27 DIAGNOSIS — Z952 Presence of prosthetic heart valve: Secondary | ICD-10-CM

## 2021-08-27 DIAGNOSIS — R809 Proteinuria, unspecified: Secondary | ICD-10-CM

## 2021-08-27 DIAGNOSIS — R42 Dizziness and giddiness: Secondary | ICD-10-CM | POA: Diagnosis not present

## 2021-08-27 LAB — COAGUCHEK XS/INR WAIVED
INR: 2.5 — ABNORMAL HIGH (ref 0.9–1.1)
Prothrombin Time: 29.5 s

## 2021-08-27 NOTE — Assessment & Plan Note (Signed)
Refer to mechanical valve plan of care. 

## 2021-08-27 NOTE — Assessment & Plan Note (Signed)
Ongoing since CVA.  Recent CT scan remains stable, suspect much of dizziness is based on poor diabetes control, however recommend he return to neurology as they recommended (he has missed 2-3 month follow-up), provided # for him to schedule.  Continue collaboration with neurology + Meclizine as needed and ordered by them.  Will work on finding a local new endo provider, as current is leaving he reports and he would prefer local.  Would benefit from local endo vs [place], would benefit better adherence to visits. ?

## 2021-08-27 NOTE — Progress Notes (Signed)
? ?BP 117/73   Pulse 93   Temp 98.4 ?F (36.9 ?C) (Oral)   Ht 5\' 8"  (1.727 m)   Wt 238 lb 12.8 oz (108.3 kg)   SpO2 95%   BMI 36.31 kg/m?   ? ?Subjective:  ? ? Patient ID: Rodney SANTIBANEZ [name], male    DOB: 01-May-1961, 61 y.o.   MRN: [id number] ? ?CC: Coumadin management ? ?HPI: This patient is a 61 y.o. male who presents for coumadin management. The expected duration of coumadin treatment is lifelong The reason for anticoagulation is  mechanical heart valve. Continues on Coumadin 9 MG two days a week and 10 MG remainder of days (changed to this recent visit). ? ?Present Coumadin dose: 9 MG two days a week and 10 MG remainder of days ?Goal: 2.5-3.5 The patient does not have an active anticoagulation episode. ?Excessive bruising: no ?Nose bleeding: no ?Rectal bleeding: no ?Prolonged menstrual cycles: N/A ?Eating diet with consistent amounts of foods containing Vitamin K:yes ?Any recent antibiotic use? no ? ?Endo referral and f/u neuro ? ?DIZZINESS ?Present since stroke on and off -- notices more in the morning and sometimes afternoons.  Does notice this more often.  Had CT scan on 05/12/21 for same complaint, this noted no new acute findings, showed chronic lacunar infarcts and several small calcified foci.  Saw neurology for dizziness 03/30/21 with no changes made, was to return in 2-3 months, but has not returned. ? ?Recent A1c 10%, continues to be poorly controlled due to poor diet and medication adherence.  He is working on diet and has lost weight. ?Duration:  chronic ?Description of symptoms: ill-defined ?Duration of episode: seconds ?Dizziness frequency: recurrent ?Provoking factors: none ?Aggravating factors:  none ?Triggered by rolling over in bed: no ?Triggered by bending over: no ?Aggravated by head movement: no ?Aggravated by exertion, coughing, loud noises: no ?Recent head injury: no ?Recent or current viral symptoms: no ?History of vasovagal episodes: no ?Nausea: no ?Vomiting: no ?Tinnitus: no ?Hearing  loss: no ?Aural fullness: no ?Headache: no ?Photophobia/phonophobia: no ?Unsteady gait: no ?Postural instability: no ?Diplopia, dysarthria, dysphagia or weakness: no ?Related to exertion: no ?Pallor: no ?Diaphoresis: no ?Dyspnea: no ?Chest pain: no  ? ?Relevant past medical, surgical, family and social history reviewed and updated as indicated. Interim medical history since our last visit reviewed. ?Allergies and medications reviewed and updated. ? ?Review of Systems  ?Constitutional:  Negative for diaphoresis, fever, malaise/fatigue and weight loss.  ?HENT: Negative.    ?Respiratory: Negative.    ?Cardiovascular: Negative.   ?Neurological:  Positive for dizziness. Negative for tingling, speech change, focal weakness, seizures, loss of consciousness, weakness and headaches.  ?Psychiatric/Behavioral: Negative.     ? ?   ?Objective:  ?  ?BP 117/73   Pulse 93   Temp 98.4 ?F (36.9 ?C) (Oral)   Ht 5\' 8"  (1.727 m)   Wt 238 lb 12.8 oz (108.3 kg)   SpO2 95%   BMI 36.31 kg/m?   ?Wt Readings from Last 3 Encounters:  ?08/27/21 238 lb 12.8 oz (108.3 kg)  ?08/11/21 242 lb 9.6 oz (110 kg)  ?07/27/21 247 lb (112 kg)  ?  ? ?Physical Exam ?Vitals and nursing note reviewed.  ?Constitutional:   ?   General: He is awake. He is not in acute distress. ?   Appearance: He is well-groomed. He is obese. He is not ill-appearing or toxic-appearing.  ?HENT:  ?   Head: Normocephalic.  ?   Right Ear: Hearing, tympanic membrane, ear  canal and external ear normal.  ?   Left Ear: Hearing, tympanic membrane, ear canal and external ear normal.  ?Eyes:  ?   General: Lids are normal.  ?   Extraocular Movements: Extraocular movements intact.  ?   Conjunctiva/sclera: Conjunctivae normal.  ?   Pupils: Pupils are equal, round, and reactive to light.  ?   Right eye: Pupil is not sluggish.  ?   Left eye: Pupil is not sluggish.  ?   Visual Fields: Right eye visual fields normal and left eye visual fields normal.  ?Neck:  ?   Thyroid: No thyromegaly.  ?    Vascular: No carotid bruit.  ?Cardiovascular:  ?   Rate and Rhythm: Normal rate and regular rhythm.  ?   Heart sounds: Normal heart sounds. No murmur heard. ?  No friction rub.  ?Pulmonary:  ?   Effort: No accessory muscle usage or respiratory distress.  ?Abdominal:  ?   General: There is no distension.  ?   Palpations: Abdomen is soft.  ?   Tenderness: There is no abdominal tenderness.  ?Musculoskeletal:  ?   Right lower leg: No edema.  ?   Left lower leg: No edema.  ?Lymphadenopathy:  ?   Head:  ?   Right side of head: No submental, submandibular, tonsillar, preauricular or posterior auricular adenopathy.  ?   Left side of head: No submental, submandibular, tonsillar, preauricular or posterior auricular adenopathy.  ?   Cervical: No cervical adenopathy.  ?Skin: ?   General: Skin is warm and dry.  ?   Capillary Refill: Capillary refill takes less than 2 seconds.  ?Neurological:  ?   Mental Status: He is alert and oriented to person, place, and time.  ?   Cranial Nerves: Cranial nerves 2-12 are intact.  ?   Motor: Motor function is intact. No weakness.  ?   Coordination: Coordination is intact. Romberg sign negative.  ?   Gait: Gait is intact.  ?   Deep Tendon Reflexes:  ?   Reflex Scores: ?     Brachioradialis reflexes are 1+ on the right side and 1+ on the left side. ?     Patellar reflexes are 1+ on the right side and 1+ on the left side. ?Psychiatric:     ?   Attention and Perception: Attention normal.     ?   Mood and Affect: Mood normal.     ?   Speech: Speech normal.     ?   Behavior: Behavior normal. Behavior is cooperative.     ?   Thought Content: Thought content normal.  ?  ?Last INR: 4.1 ? ?Last CBC:  ?Lab Results  ?Component Value Date  ? WBC 7.6 06/16/2021  ? HGB 15.8 06/16/2021  ? HCT 47.7 06/16/2021  ? MCV 84 06/16/2021  ? PLT 180 06/16/2021  ? ? ?Results for orders placed or performed in visit on 08/27/21  ?CoaguChek XS/INR Waived  ?Result Value Ref Range  ? INR 2.5 (H) 0.9 - 1.1  ? Prothrombin Time  29.5 sec  ? ?   ? ?Assessment:  ? ?  ICD-10-CM   ?1. Type 2 diabetes mellitus with proteinuria (HCC)  E11.29 Ambulatory referral to Endocrinology  ? R80.9   ?  ?2. History of mechanical aortic valve replacement  Z95.2 CoaguChek XS/INR Waived  ?  ?3. Current use of long term anticoagulation  Z79.01 CoaguChek XS/INR Waived  ?  ?4. Dizziness  R42   ?  ? ? ?  Plan:  ? ?Discussed current plan face-to-face with patient. For coumadin dosing, elected to remain 9 MG two days a week and 10 MG remainder of days. Will plan to recheck INR in 4 weeks. ?

## 2021-08-27 NOTE — Assessment & Plan Note (Signed)
Chronic, ongoing with Coumadin use.  INR today 2.5, at goal 2.5 to 3.5. Elected to continue 9 MG two days a week and 10 MG remainder of days.  Return in 4 weeks for INR check.  Return sooner to office if any bleeding or concerns. ?

## 2021-08-27 NOTE — Assessment & Plan Note (Signed)
Chronic, ongoing with A1c elevated on check recently at 10%, previous 7.8% -- missed December endo visit and he reports endo is leaving the practice.  Urine ALB 30 in February 2023.   ?- Referral to new endo and local placed. ?- Continue NPH at current dosing and increase Ozempic to 1 MG weekly + Marcelline Deist continue at current dosing -- no Metformin due to CKD.   ?- Continue Benazepril for kidney protection.   ?- Focus heavily on diabetic diet and modest weight loss, decreasing carb and sugar intake at home -- have discussed at length at visits.   ?- Recommend he check BS 3-4 times a day and document for next visit for review.  Will continue to work with CCM team to ensure adherence to medication regimen and diet, which is a major issue.   Discussed at length his A1C goal for stroke prevention being 6.5% or less.  Return in May for A1c. ?

## 2021-09-19 NOTE — Patient Instructions (Signed)

## 2021-09-22 ENCOUNTER — Other Ambulatory Visit: Payer: Self-pay | Admitting: Nurse Practitioner

## 2021-09-22 NOTE — Telephone Encounter (Signed)
Requested medication (s) are due for refill today - yes ? ?Requested medication (s) are on the active medication list -yes ? ?Future visit scheduled -yes ? ?Last refill: warfarin 4mg  11/23/20 #90 1RF ?                              5mg  04/27/21 #90 4RF ? ?Notes to clinic: Manual review of dosing request-PCP ? ?Requested Prescriptions  ?Pending Prescriptions Disp Refills  ? warfarin (COUMADIN) 4 MG tablet [Pharmacy Med Name: WARFARIN SODIUM 4 MG TABLET] 90 tablet 1  ?  Sig: TAKE 1 TABLET BY MOUTH EVERY DAY  ?  ? Hematology:  Anticoagulants - warfarin Failed - 09/22/2021  2:00 AM  ?  ?  Failed - Manual Review: If patient's warfarin is managed by Anti-Coag team, route request to them. If not, route request to the provider.  ?  ?  Failed - INR in normal range and within 30 days  ?  INR  ?Date Value Ref Range Status  ?08/27/2021 2.5 (H) 0.9 - 1.1 Final  ?   ?  ?  Passed - HCT in normal range and within 360 days  ?  Hematocrit  ?Date Value Ref Range Status  ?06/16/2021 47.7 37.5 - 51.0 % Final  ?   ?  ?  Passed - Patient is not pregnant  ?  ?  Passed - Valid encounter within last 3 months  ?  Recent Outpatient Visits   ? ?      ? 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 [place], Hester T, NP  ? 1 month ago History of mechanical aortic valve replacement  ? [facility] [place], Parshall T, NP  ? 1 month ago Acute diffuse otitis externa of left ear  ? [facility] Kathrine Haddock, NP  ? 2 months ago Acquired thrombophilia ([place])  ? Clarence, Baldwin T, NP  ? 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 [facility] [place], Henrine Screws T, NP  ? ?  ?  ?Future Appointments   ? ?        ? In 2 days Cannady, Barbaraann Faster, NP [facility]  ? ?  ? ? ?  ?  ?  ? warfarin (COUMADIN) 5 MG tablet [Pharmacy Med Name: WARFARIN SODIUM 5 MG TABLET] 180 tablet 1  ?  Sig: START TAKING 7 MG ON MONDAY, WEDNESDAY,  FRIDAY, SATURDAY, SUNDAY AND 7.5 MG ON TUESDAY AND THURSDAY. RETURN IN ONE WEEK. UTILIZE 1 AND A 1/2 (5 MG) TABLETS TO GET YOUR 7.5 MG DOSING.  ?  ? Hematology:  Anticoagulants - warfarin Failed - 09/22/2021  2:00 AM  ?  ?  Failed - Manual Review: If patient's warfarin is managed by Anti-Coag team, route request to them. If not, route request to the provider.  ?  ?  Failed - INR in normal range and within 30 days  ?  INR  ?Date Value Ref Range Status  ?08/27/2021 2.5 (H) 0.9 - 1.1 Final  ?   ?  ?  Passed - HCT in normal range and within 360 days  ?  Hematocrit  ?Date Value Ref Range Status  ?06/16/2021 47.7 37.5 - 51.0 % Final  ?   ?  ?  Passed - Patient is not pregnant  ?  ?  Passed - Valid  encounter within last 3 months  ?  Recent Outpatient Visits   ? ?      ? 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 [place], [place] T, NP  ? 1 month ago History of mechanical aortic valve replacement  ? [facility] [place], [place] T, NP  ? 1 month ago Acute diffuse otitis externa of left ear  ? [facility] Kathrine Haddock, NP  ? 2 months ago Acquired thrombophilia ([place])  ? [place], Jayuya T, NP  ? 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 [facility] [place], Henrine Screws T, NP  ? ?  ?  ?Future Appointments   ? ?        ? In 2 days Venita Lick, NP [facility]  ? ?  ? ? ?  ?  ?  ? ? ? ?Requested Prescriptions  ?Pending Prescriptions Disp Refills  ? warfarin (COUMADIN) 4 MG tablet [Pharmacy Med Name: WARFARIN SODIUM 4 MG TABLET] 90 tablet 1  ?  Sig: TAKE 1 TABLET BY MOUTH EVERY DAY  ?  ? Hematology:  Anticoagulants - warfarin Failed - 09/22/2021  2:00 AM  ?  ?  Failed - Manual Review: If patient's warfarin is managed by Anti-Coag team, route request to them. If not, route request to the provider.  ?  ?  Failed - INR in normal range and within 30 days  ?  INR  ?Date Value Ref Range Status   ?08/27/2021 2.5 (H) 0.9 - 1.1 Final  ?   ?  ?  Passed - HCT in normal range and within 360 days  ?  Hematocrit  ?Date Value Ref Range Status  ?06/16/2021 47.7 37.5 - 51.0 % Final  ?   ?  ?  Passed - Patient is not pregnant  ?  ?  Passed - Valid encounter within last 3 months  ?  Recent Outpatient Visits   ? ?      ? 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 [place], [place] T, NP  ? 1 month ago History of mechanical aortic valve replacement  ? [facility] [place], [place] T, NP  ? 1 month ago Acute diffuse otitis externa of left ear  ? [facility] Kathrine Haddock, NP  ? 2 months ago Acquired thrombophilia ([place])  ? [place], [place] T, NP  ? 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 [facility] [place], Henrine Screws T, NP  ? ?  ?  ?Future Appointments   ? ?        ? In 2 days Cannady, Barbaraann Faster, NP [facility]  ? ?  ? ? ?  ?  ?  ? warfarin (COUMADIN) 5 MG tablet [Pharmacy Med Name: WARFARIN SODIUM 5 MG TABLET] 180 tablet 1  ?  Sig: START TAKING 7 MG ON MONDAY, WEDNESDAY, FRIDAY, SATURDAY, SUNDAY AND 7.5 MG ON TUESDAY AND THURSDAY. RETURN IN ONE WEEK. UTILIZE 1 AND A 1/2 (5 MG) TABLETS TO GET YOUR 7.5 MG DOSING.  ?  ? Hematology:  Anticoagulants - warfarin Failed - 09/22/2021  2:00 AM  ?  ?  Failed - Manual Review: If patient's warfarin is managed by Anti-Coag team, route request to them. If not, route request to the provider.  ?  ?  Failed - INR in normal range and within 30 days  ?  INR  ?Date Value Ref Range Status  ?08/27/2021 2.5 (H) 0.9 - 1.1 Final  ?   ?  ?  Passed - HCT in normal range and within 360 days  ?  Hematocrit  ?Date Value Ref Range Status  ?06/16/2021 47.7 37.5 - 51.0 % Final  ?   ?  ?  Passed - Patient is not pregnant  ?  ?  Passed - Valid encounter within last 3 months  ?  Recent Outpatient Visits   ? ?      ? 3 weeks ago Type 2 diabetes mellitus with  proteinuria ([place])  ? [place], Grandin T, NP  ? 1 month ago History of mechanical aortic valve replacement  ? [facility] [place], [place] T, NP  ? 1 month ago Acute diffuse otitis externa of left ear  ? [facility] Kathrine Haddock, NP  ? 2 months ago Acquired thrombophilia ([place])  ? [place], [employer] T, NP  ? 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 [facility] [place], Henrine Screws T, NP  ? ?  ?  ?Future Appointments   ? ?        ? In 2 days Venita Lick, NP [facility] [facility]  ? ?  ? ? ?  ?  ?  ? ? ? ?

## 2021-09-24 ENCOUNTER — Ambulatory Visit (INDEPENDENT_AMBULATORY_CARE_PROVIDER_SITE_OTHER): Payer: 59 | Admitting: Nurse Practitioner

## 2021-09-24 ENCOUNTER — Encounter: Payer: Self-pay | Admitting: Nurse Practitioner

## 2021-09-24 VITALS — BP 125/79 | HR 91 | Temp 97.9°F | Ht 68.0 in | Wt 240.4 lb

## 2021-09-24 DIAGNOSIS — N1831 Chronic kidney disease, stage 3a: Secondary | ICD-10-CM

## 2021-09-24 DIAGNOSIS — I482 Chronic atrial fibrillation, unspecified: Secondary | ICD-10-CM

## 2021-09-24 DIAGNOSIS — E1129 Type 2 diabetes mellitus with other diabetic kidney complication: Secondary | ICD-10-CM | POA: Diagnosis not present

## 2021-09-24 DIAGNOSIS — E1159 Type 2 diabetes mellitus with other circulatory complications: Secondary | ICD-10-CM

## 2021-09-24 DIAGNOSIS — E785 Hyperlipidemia, unspecified: Secondary | ICD-10-CM

## 2021-09-24 DIAGNOSIS — E1169 Type 2 diabetes mellitus with other specified complication: Secondary | ICD-10-CM

## 2021-09-24 DIAGNOSIS — Z952 Presence of prosthetic heart valve: Secondary | ICD-10-CM

## 2021-09-24 DIAGNOSIS — Z8673 Personal history of transient ischemic attack (TIA), and cerebral infarction without residual deficits: Secondary | ICD-10-CM

## 2021-09-24 DIAGNOSIS — D6869 Other thrombophilia: Secondary | ICD-10-CM

## 2021-09-24 DIAGNOSIS — E1142 Type 2 diabetes mellitus with diabetic polyneuropathy: Secondary | ICD-10-CM | POA: Diagnosis not present

## 2021-09-24 DIAGNOSIS — I152 Hypertension secondary to endocrine disorders: Secondary | ICD-10-CM

## 2021-09-24 DIAGNOSIS — E1122 Type 2 diabetes mellitus with diabetic chronic kidney disease: Secondary | ICD-10-CM | POA: Diagnosis not present

## 2021-09-24 DIAGNOSIS — Z7901 Long term (current) use of anticoagulants: Secondary | ICD-10-CM

## 2021-09-24 DIAGNOSIS — Z794 Long term (current) use of insulin: Secondary | ICD-10-CM

## 2021-09-24 DIAGNOSIS — R809 Proteinuria, unspecified: Secondary | ICD-10-CM

## 2021-09-24 DIAGNOSIS — Z91148 Patient's other noncompliance with medication regimen for other reason: Secondary | ICD-10-CM

## 2021-09-24 LAB — COAGUCHEK XS/INR WAIVED
INR: 3.2 — ABNORMAL HIGH (ref 0.9–1.1)
Prothrombin Time: 38.6 s

## 2021-09-24 LAB — BAYER DCA HB A1C WAIVED: HB A1C (BAYER DCA - WAIVED): 7.5 % — ABNORMAL HIGH (ref 4.8–5.6)

## 2021-09-24 MED ORDER — ATORVASTATIN CALCIUM 80 MG PO TABS: 80.0000 mg | ORAL_TABLET | Freq: Every day | ORAL | 4 refills | Status: DC

## 2021-09-24 MED ORDER — NOVOLIN N FLEXPEN 100 UNIT/ML ~~LOC~~ SUPN: 50.0000 [IU] | PEN_INJECTOR | SUBCUTANEOUS | 3 refills | Status: DC

## 2021-09-24 NOTE — Assessment & Plan Note (Addendum)
Chronic, ongoing with A1c trending down from 10% to 7.5% today -- has not scheduled with endo or heard from new referral, will check on this.  Urine ALB 30 February 2023.  Not adherent with diet. - Continue NPH at current dosing and Ozempic to 1 MG weekly + Farxiga continue at current dosing -- no Metformin due to CKD.  Consider increase to 2 MG Ozempic next visit, dependent on lows. - Continue Benazepril for kidney protection.   - Focus heavily on diabetic diet and modest weight loss, decreasing carb and sugar intake at home -- have discussed at length at visits.   - Up to date foot exam, not up to date eye exam -- recommend he obtain - Would benefit from [facility] - Recommend he check BS 3-4 times a day and document for next visit for review.  Will continue to work with CCM team to ensure adherence to medication regimen and diet, which is a major issue.   Discussed at length his A1C goal for stroke prevention being 6.5% or less.  Return in 3 months.

## 2021-09-24 NOTE — Assessment & Plan Note (Signed)
Chronic, ongoing with BP at goal today, reduced stressors at this time. Educated him on diabetes effect on vascular system.  Continue Benazepril, HCTZ, Coreg.  Recommend he monitor BP at home at least a few mornings a week + focus on DASH diet.  Discussed with him stroke prevention goal for BP <130/90.  LABS: CBC, CMP, TSH.  Continue cardiology collaboration.  Return in 3 months.

## 2021-09-24 NOTE — Assessment & Plan Note (Signed)
Discussed at length stroke prevention goals BP <130/90, A1c <6.5%, and LDL <70.  He continues to have poor diabetes control, which have discussed at length with him -- much is related to poor diet choices. 

## 2021-09-24 NOTE — Assessment & Plan Note (Signed)
Chronic, ongoing with Coumadin use.  INR today 3.2, at goal 2.5 to 3.5. Elected to continue 9 MG two days a week and 10 MG remainder..  Return in 4 weeks for INR check.  Return sooner to office if any bleeding or concerns.

## 2021-09-24 NOTE — Assessment & Plan Note (Signed)
Chronic, ongoing.  Refer to diabetes plan of care for further. 

## 2021-09-24 NOTE — Assessment & Plan Note (Signed)
Chronic, ongoing.  Continue current medication regimen and adjust as needed. Lipid panel today. 

## 2021-09-24 NOTE — Assessment & Plan Note (Signed)
Chronic, ongoing with T2DM.  Continue Benazepril for kidney protection, urine ALB 30 (February 2023).  Discussed with patient dependent on labs may need to renally dose Gabapentin.  Recommend heavy focus on controlling his diabetes to prevent worsening CKD and possible need for dialysis in future.  CMP today. 

## 2021-09-24 NOTE — Assessment & Plan Note (Signed)
Refer to mechanical valve plan of care. 

## 2021-09-24 NOTE — Assessment & Plan Note (Signed)
Monitor CBC with use of daily Coumadin for mechanical valve and a-fib.  Denies any current bleeding episodes.  Check CBC today.

## 2021-09-24 NOTE — Assessment & Plan Note (Signed)
BMI 36.55 with T2DM, HTN/HLD.  Recommended eating smaller high protein, low fat meals more frequently and exercising 30 mins a day 5 times a week with a goal of 10-15lb weight loss in the next 3 months. Patient voiced their understanding and motivation to adhere to these recommendations.

## 2021-09-24 NOTE — Assessment & Plan Note (Signed)
Refer to diabetes with CKD plan of care. °

## 2021-09-24 NOTE — Progress Notes (Addendum)
BP 125/79 (BP Location: Left Arm, Cuff Size: Normal)   Pulse 91   Temp 97.9 F (36.6 C) (Oral)   Ht 5\' 8"  (1.727 m)   Wt 240 lb 6.4 oz (109 kg)   SpO2 95%   BMI 36.55 kg/m    Subjective:    Patient ID: Rodney Chary [name], male    DOB: 12/12/1960, 61 y.o.   MRN: [id number]  HPI: Rodney Bergthold. is a 61 y.o. male  Chief Complaint  Patient presents with   Diabetes    Patient declines having a recent Diabetic Eye Exam.    Hyperlipidemia   Hypertension   Mood   INR Check   DIABETES Last saw Dr. Everardo All 01/26/21 and continues on current medications, Farxiga, NPH, Ozempic 0.5 MG continued -- A1c 7.8% last check September.   Was scheduled to return to endo in December, but shows had a "no show", which he reports he did not know about.  Have discussed with him at all recent visits to reschedule, which he has not.  On visit 08/27/21 placed referral to Dr. Patrecia Pace locally, as patient reported it would be easier to have provider here and [facility] discharged him.  He has endorsed being an "eater" + enjoying sweets.   Continues on Gabapentin -- taking 900 MG in the morning, 900 MG at lunch, and then 1200 MG before bed.  States this does offer some benefit.  Hypoglycemic episodes: 69 one time last week Polydipsia/polyuria: no Visual disturbance: no Chest pain: no Paresthesias: no Glucose Monitoring: yes  Accucheck frequency:  occasionally  Fasting glucose:  Post prandial:  Evening:  Before meals: Taking Insulin?: yes  Long acting insulin:  Short acting insulin: Blood Pressure Monitoring: not checking Retinal Examination: Not up to Date Foot Exam: Up to Date Pneumovax: Up to Date Influenza: Up to Date Aspirin: no   HYPERTENSION / HYPERLIPIDEMIA Last saw cardiology last 07/05/21 -- they continue to recommend sleep study and have ordered, but he has not obtained.  He is to continue Benazepril and HCTZ, Carvedilol.  Continues on Atorvastatin and Fenofibrate for HLD. Recent  echo noted normal LV function.  Satisfied with current treatment? yes Duration of hypertension: chronic BP monitoring frequency: not checking BP range:  BP medication side effects: no Duration of hyperlipidemia: chronic Cholesterol medication side effects: no Cholesterol supplements: none Medication compliance: good compliance Aspirin: no Recent stressors: no Recurrent headaches: no Visual changes: no Palpitations: no Dyspnea: no Chest pain: no Lower extremity edema: no Dizzy/lightheaded: no   CHRONIC KIDNEY DISEASE Recent labs have improved levels. CKD status: stable Medications renally dose: yes Previous renal evaluation: no Pneumovax:  Up to Date Influenza Vaccine:  Up to Date   ATRIAL FIBRILLATION Currently takes Coumadin 10 MG daily with INR 2.5 last visit on 08/27/21, today is 3.2.  Continues on 9 MG two days a week and 10 MG remainder.   Last saw neurology on 03/30/21 for history of stroke.  Has dizziness ongoing on occasion -- recent CT remains reassuring on 05/12/21. Atrial fibrillation status: stable Satisfied with current treatment: yes  Medication side effects:  no Medication compliance: good compliance Etiology of atrial fibrillation: unknown Palpitations:  no Chest pain:  no Dyspnea on exertion:  no Orthopnea:  no Syncope:  no Edema:  no Ventricular rate control: B-blocker Anti-coagulation: warfarin   DEPRESSION Currently not taking any medications for mood, previously had taken Duloxetine. Mood status: controlled Depressed mood: no Anxious mood: no Anhedonia: no Significant weight  loss or gain: no Insomnia: yes hard to fall asleep Fatigue: no Feelings of worthlessness or guilt: no Impaired concentration/indecisiveness: no Suicidal ideations: no Hopelessness: no Crying spells: no    09/24/2021    3:12 PM 02/17/2021    6:39 PM 11/06/2020    8:27 AM 10/19/2020    4:31 PM 04/07/2020    2:29 PM  Depression screen PHQ 2/9  Decreased Interest 0 0 0  0 0  Down, Depressed, Hopeless 0 0 1 1 0  PHQ - 2 Score 0 0 1 1 0  Altered sleeping 0 0 0 0 1  Tired, decreased energy 0 0 0 3 1  Change in appetite 0 0 0 1 0  Feeling bad or failure about yourself  0 0 0 1 0  Trouble concentrating 0 0 0 0 0  Moving slowly or fidgety/restless 0 0 0 0 0  Suicidal thoughts 0 0 0 0 0  PHQ-9 Score 0 0 1 6 2   Difficult doing work/chores Not difficult at all    Not difficult at all     Relevant past medical, surgical, family and social history reviewed and updated as indicated. Interim medical history since our last visit reviewed. Allergies and medications reviewed and updated.  Review of Systems  Constitutional:  Negative for activity change, diaphoresis, fatigue and fever.  Respiratory:  Negative for cough, chest tightness, shortness of breath and wheezing.   Cardiovascular:  Negative for chest pain, palpitations and leg swelling.  Gastrointestinal: Negative.   Endocrine: Negative for cold intolerance, heat intolerance, polydipsia, polyphagia and polyuria.  Neurological: Negative.   Psychiatric/Behavioral: Negative.     Per HPI unless specifically indicated above     Objective:    BP 125/79 (BP Location: Left Arm, Cuff Size: Normal)   Pulse 91   Temp 97.9 F (36.6 C) (Oral)   Ht 5\' 8"  (1.727 m)   Wt 240 lb 6.4 oz (109 kg)   SpO2 95%   BMI 36.55 kg/m   Wt Readings from Last 3 Encounters:  09/24/21 240 lb 6.4 oz (109 kg)  08/27/21 238 lb 12.8 oz (108.3 kg)  08/11/21 242 lb 9.6 oz (110 kg)    Physical Exam Vitals and nursing note reviewed.  Constitutional:      General: He is awake. He is not in acute distress.    Appearance: He is well-developed and well-groomed. He is morbidly obese. He is not ill-appearing.  HENT:     Head: Normocephalic and atraumatic.     Right Ear: Hearing normal. No drainage.     Left Ear: Hearing normal. No drainage.  Eyes:     General: Lids are normal.        Right eye: No discharge.        Left eye: No  discharge.     Conjunctiva/sclera: Conjunctivae normal.     Pupils: Pupils are equal, round, and reactive to light.  Neck:     Thyroid: No thyromegaly.     Vascular: No carotid bruit or JVD.  Cardiovascular:     Rate and Rhythm: Normal rate and regular rhythm.     Heart sounds: Normal heart sounds, S1 normal and S2 normal. No murmur heard.   No gallop.  Pulmonary:     Effort: Pulmonary effort is normal. No accessory muscle usage or respiratory distress.     Breath sounds: Normal breath sounds.  Abdominal:     General: Bowel sounds are normal.     Palpations: Abdomen is soft.  Musculoskeletal:        General: Normal range of motion.     Right shoulder: Normal.     Left shoulder: Normal.     Cervical back: Normal range of motion and neck supple.     Right lower leg: No edema.     Left lower leg: No edema.  Skin:    General: Skin is warm and dry.     Capillary Refill: Capillary refill takes less than 2 seconds.     Findings: No rash.  Neurological:     Mental Status: He is alert and oriented to person, place, and time.     Deep Tendon Reflexes: Reflexes are normal and symmetric.  Psychiatric:        Attention and Perception: Attention normal.        Mood and Affect: Mood normal.        Speech: Speech normal.        Behavior: Behavior normal. Behavior is cooperative.        Thought Content: Thought content normal.   Results for orders placed or performed in visit on 09/24/21  Bayer DCA Hb A1c Waived  Result Value Ref Range   HB A1C (BAYER DCA - WAIVED) 7.5 (H) 4.8 - 5.6 %  CoaguChek XS/INR Waived  Result Value Ref Range   INR 3.2 (H) 0.9 - 1.1   Prothrombin Time 38.6 sec      Assessment & Plan:   Problem List Items Addressed This Visit       Cardiovascular and Mediastinum   Chronic atrial fibrillation (HCC)    Chronic, ongoing with Coumadin use.  INR today 3.2, at goal 2.5 to 3.5. Elected to continue 9 MG two days a week and 10 MG remainder..  Return in 4 weeks for  INR check.  Return sooner to office if any bleeding or concerns.      Relevant Medications   atorvastatin (LIPITOR) 80 MG tablet   Other Relevant Orders   CBC with Differential/Platelet   Hypertension associated with type 2 diabetes mellitus (HCC)    Chronic, ongoing with BP at goal today, reduced stressors at this time. Educated him on diabetes effect on vascular system.  Continue Benazepril, HCTZ, Coreg.  Recommend he monitor BP at home at least a few mornings a week + focus on DASH diet.  Discussed with him stroke prevention goal for BP <130/90.  LABS: CBC, CMP, TSH.  Continue cardiology collaboration.  Return in 3 months.       Relevant Medications   Insulin NPH, Human,, Isophane, (NOVOLIN N FLEXPEN) 100 UNIT/ML Kiwkpen   atorvastatin (LIPITOR) 80 MG tablet   Other Relevant Orders   Bayer DCA Hb A1c Waived (Completed)   Comprehensive metabolic panel   CBC with Differential/Platelet   TSH     Endocrine   Hyperlipidemia associated with type 2 diabetes mellitus (HCC)    Chronic, ongoing.  Continue current medication regimen and adjust as needed.  Lipid panel today.       Relevant Medications   Insulin NPH, Human,, Isophane, (NOVOLIN N FLEXPEN) 100 UNIT/ML Kiwkpen   atorvastatin (LIPITOR) 80 MG tablet   Other Relevant Orders   Bayer DCA Hb A1c Waived (Completed)   Comprehensive metabolic panel   Lipid Panel w/o Chol/HDL Ratio   Type 2 diabetes mellitus with diabetic chronic kidney disease (HCC)    Chronic, ongoing with A1c trending down from 10% to 7.5% today -- has not scheduled with endo or heard from new referral,  will check on this.  Urine ALB 30 February 2023.  Not adherent with diet. - Continue NPH at current dosing and Ozempic to 1 MG weekly + Farxiga continue at current dosing -- no Metformin due to CKD.  Consider increase to 2 MG Ozempic next visit, dependent on lows. - Continue Benazepril for kidney protection.   - Focus heavily on diabetic diet and modest weight loss,  decreasing carb and sugar intake at home -- have discussed at length at visits.   - Up to date foot exam, not up to date eye exam -- recommend he obtain - Would benefit from [facility] - Recommend he check BS 3-4 times a day and document for next visit for review.  Will continue to work with CCM team to ensure adherence to medication regimen and diet, which is a major issue.   Discussed at length his A1C goal for stroke prevention being 6.5% or less.  Return in 3 months.      Relevant Medications   Insulin NPH, Human,, Isophane, (NOVOLIN N FLEXPEN) 100 UNIT/ML Kiwkpen   atorvastatin (LIPITOR) 80 MG tablet   Other Relevant Orders   Bayer DCA Hb A1c Waived (Completed)   Comprehensive metabolic panel   Type 2 diabetes mellitus with peripheral neuropathy (HCC)    Refer to diabetes with CKD plan of care.       Relevant Medications   Insulin NPH, Human,, Isophane, (NOVOLIN N FLEXPEN) 100 UNIT/ML Kiwkpen   atorvastatin (LIPITOR) 80 MG tablet   Other Relevant Orders   Bayer DCA Hb A1c Waived (Completed)   Comprehensive metabolic panel   Type 2 diabetes mellitus with proteinuria (HCC) - Primary    Refer to diabetes with CKD plan of care.       Relevant Medications   Insulin NPH, Human,, Isophane, (NOVOLIN N FLEXPEN) 100 UNIT/ML Kiwkpen   atorvastatin (LIPITOR) 80 MG tablet   Other Relevant Orders   Bayer DCA Hb A1c Waived (Completed)   Comprehensive metabolic panel     Genitourinary   CKD (chronic kidney disease) stage 3, GFR 30-59 ml/min (HCC)    Chronic, ongoing with T2DM.  Continue Benazepril for kidney protection, urine ALB 30 (February 2023).  Discussed with patient dependent on labs may need to renally dose Gabapentin.  Recommend heavy focus on controlling his diabetes to prevent worsening CKD and possible need for dialysis in future.  CMP today.       Relevant Orders   Comprehensive metabolic panel     Hematopoietic and Hemostatic   Acquired thrombophilia (HCC)     Monitor CBC with use of daily Coumadin for mechanical valve and a-fib.  Denies any current bleeding episodes.  Check CBC today.       Relevant Orders   CBC with Differential/Platelet     Other   Current use of long term anticoagulation    Refer to mechanical valve plan of care.       Relevant Orders   CoaguChek XS/INR Waived (Completed)   History of mechanical aortic valve replacement    Chronic, ongoing with Coumadin use.  INR today 3.2, at goal 2.5 to 3.5. Elected to continue 9 MG two days a week and 10 MG remainder..  Return in 4 weeks for INR check.  Return sooner to office if any bleeding or concerns.      Relevant Orders   CoaguChek XS/INR Waived (Completed)   History of stroke    Discussed at length stroke prevention goals BP <130/90, A1c <6.5%,  and LDL <70.  He continues to have poor diabetes control, which have discussed at length with him -- much is related to poor diet choices.       Long-term insulin use (HCC)    Chronic, ongoing.  Refer to diabetes plan of care for further.       Relevant Orders   Bayer DCA Hb A1c Waived (Completed)   Morbid obesity (HCC)    BMI 36.55 with T2DM, HTN/HLD.  Recommended eating smaller high protein, low fat meals more frequently and exercising 30 mins a day 5 times a week with a goal of 10-15lb weight loss in the next 3 months. Patient voiced their understanding and motivation to adhere to these recommendations.        Relevant Medications   Insulin NPH, Human,, Isophane, (NOVOLIN N FLEXPEN) 100 UNIT/ML Kiwkpen   Noncompliance w/medication treatment due to intermit use of medication    Major concern and reiterated to patient necessity of adhering to medication and diet regimen as he is at high risk for recurrent cardiac event with current co morbidities.           Follow up plan: Return in about 4 weeks (around 10/22/2021) for INR check.

## 2021-09-24 NOTE — Addendum Note (Signed)
Addended by: Aura Dials T on: 09/24/2021 06:16 PM   Modules accepted: Orders

## 2021-09-24 NOTE — Assessment & Plan Note (Signed)
Major concern and reiterated to patient necessity of adhering to medication and diet regimen as he is at high risk for recurrent cardiac event with current co morbidities.   

## 2021-09-25 LAB — CBC WITH DIFFERENTIAL/PLATELET
Basophils Absolute: 0 10*3/uL (ref 0.0–0.2)
Basos: 0 %
EOS (ABSOLUTE): 0.2 10*3/uL (ref 0.0–0.4)
Eos: 2 %
Hematocrit: 45.7 % (ref 37.5–51.0)
Hemoglobin: 15.8 g/dL (ref 13.0–17.7)
Immature Grans (Abs): 0 10*3/uL (ref 0.0–0.1)
Immature Granulocytes: 0 %
Lymphocytes Absolute: 2.3 10*3/uL (ref 0.7–3.1)
Lymphs: 26 %
MCH: 28.9 pg (ref 26.6–33.0)
MCHC: 34.6 g/dL (ref 31.5–35.7)
MCV: 84 fL (ref 79–97)
Monocytes Absolute: 0.6 10*3/uL (ref 0.1–0.9)
Monocytes: 7 %
Neutrophils Absolute: 5.8 10*3/uL (ref 1.4–7.0)
Neutrophils: 65 %
Platelets: 216 10*3/uL (ref 150–450)
RBC: 5.47 x10E6/uL (ref 4.14–5.80)
RDW: 12.9 % (ref 11.6–15.4)
WBC: 8.9 10*3/uL (ref 3.4–10.8)

## 2021-09-25 LAB — COMPREHENSIVE METABOLIC PANEL
ALT: 26 IU/L (ref 0–44)
AST: 21 IU/L (ref 0–40)
Albumin/Globulin Ratio: 1.7 (ref 1.2–2.2)
Albumin: 4.3 g/dL (ref 3.8–4.8)
Alkaline Phosphatase: 102 IU/L (ref 44–121)
BUN/Creatinine Ratio: 19 (ref 10–24)
BUN: 19 mg/dL (ref 8–27)
Bilirubin Total: 0.5 mg/dL (ref 0.0–1.2)
CO2: 23 mmol/L (ref 20–29)
Calcium: 9.3 mg/dL (ref 8.6–10.2)
Chloride: 100 mmol/L (ref 96–106)
Creatinine, Ser: 0.99 mg/dL (ref 0.76–1.27)
Globulin, Total: 2.5 g/dL (ref 1.5–4.5)
Glucose: 145 mg/dL — ABNORMAL HIGH (ref 70–99)
Potassium: 3.8 mmol/L (ref 3.5–5.2)
Sodium: 139 mmol/L (ref 134–144)
Total Protein: 6.8 g/dL (ref 6.0–8.5)
eGFR: 87 mL/min/{1.73_m2} (ref >59)

## 2021-09-25 LAB — TSH: TSH: 1.38 u[IU]/mL (ref 0.450–4.500)

## 2021-09-25 LAB — LIPID PANEL W/O CHOL/HDL RATIO
Cholesterol, Total: 108 mg/dL (ref 100–199)
HDL: 23 mg/dL — ABNORMAL LOW (ref >39)
LDL Chol Calc (NIH): 17 mg/dL (ref 0–99)
Triglycerides: 512 mg/dL — ABNORMAL HIGH (ref 0–149)
VLDL Cholesterol Cal: 68 mg/dL — ABNORMAL HIGH (ref 5–40)

## 2021-09-26 NOTE — Progress Notes (Signed)
Good morning crew, please let Lister know his labs have returned.  Kidney function remains stable at this time.  Great news!!  Liver function also stable.  Cholesterol levels are showing LDL, bad cholesterol, at goal, but triglycerides remain elevated.  Continue current medications and would like to check fasting labs in future.  CBC shows no anemia or infection.  Thyroid lab is normal.  Any questions? Keep being amazing!!  Thank you for allowing me to participate in your care.  I appreciate you. Kindest regards, Cheyene Hamric

## 2021-10-07 IMAGING — CT CT HEAD W/O CM
2 of 4 series · 13 of 47 positions shown, 16 images · non-contrast
Comparison: None

CLINICAL DATA: AA, 1 minutes visual loss RIGHT eye, hyperglycemia,
had [id number] in December 2019 on a diabetes mellitus, hypertension

EXAM:
CT HEAD WITHOUT CONTRAST
TECHNIQUE: Contiguous axial images were obtained from the base of the skull
through the vertex without intravenous contrast.

[Series 2: axial st head 5.00 ax · axial · 0.34mm/px · z∈[-518,-403]mm · 10 of 27 slices shown, 13 images]
[im 2/27  brain]
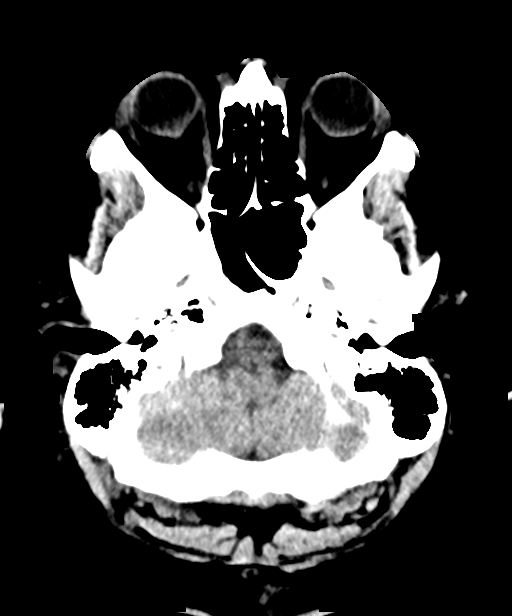
[im 2/27  bone]
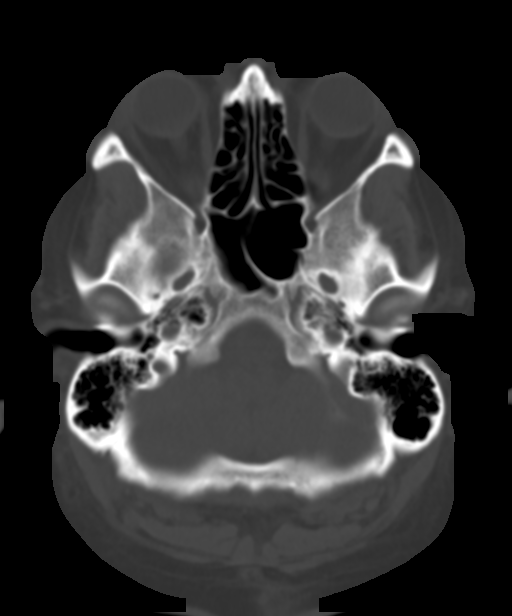
[im 4/27  brain]
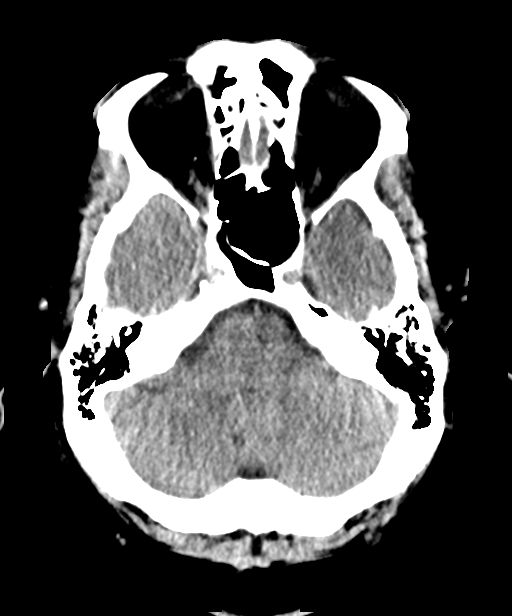
[im 8/27  brain]
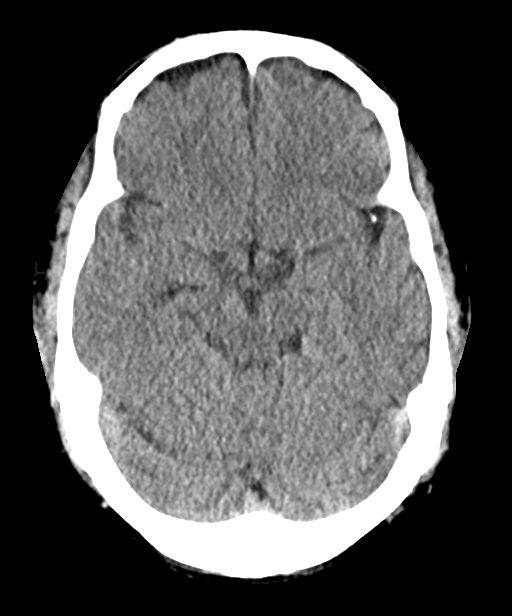
[im 10/27  brain]
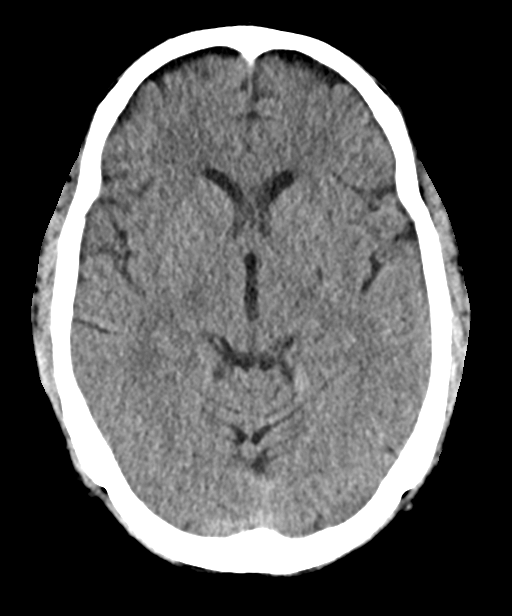
[im 12/27  brain]
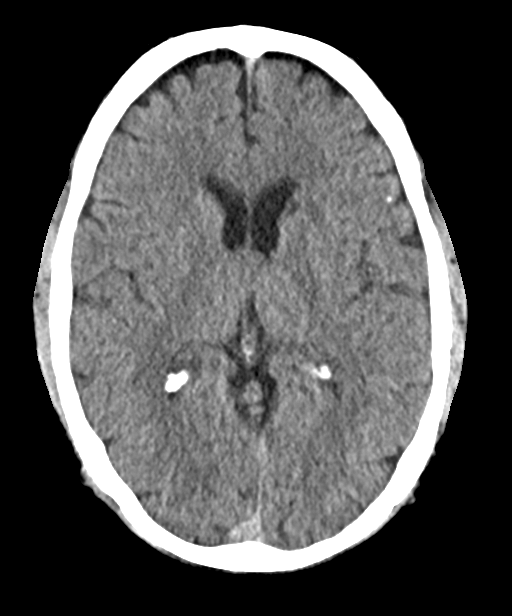
[im 12/27  bone]
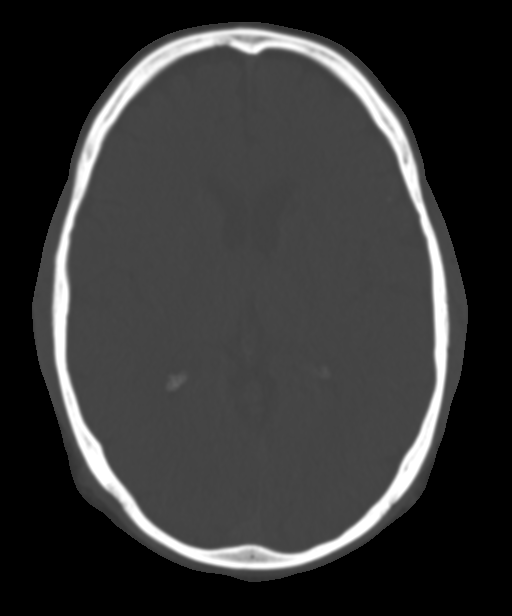
[im 15/27  brain]
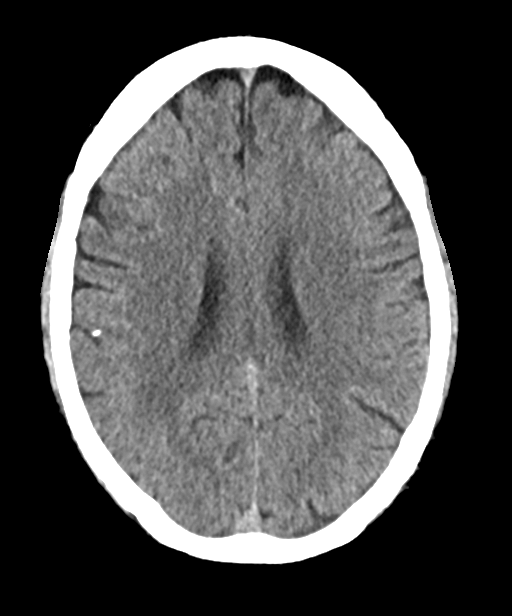
[im 17/27  brain]
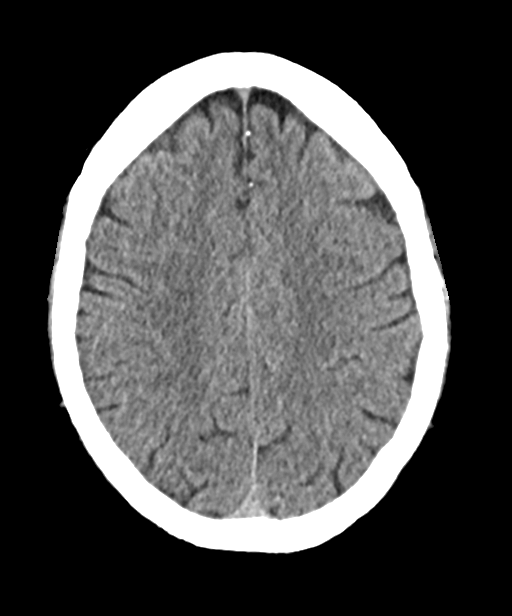
[im 19/27  brain]
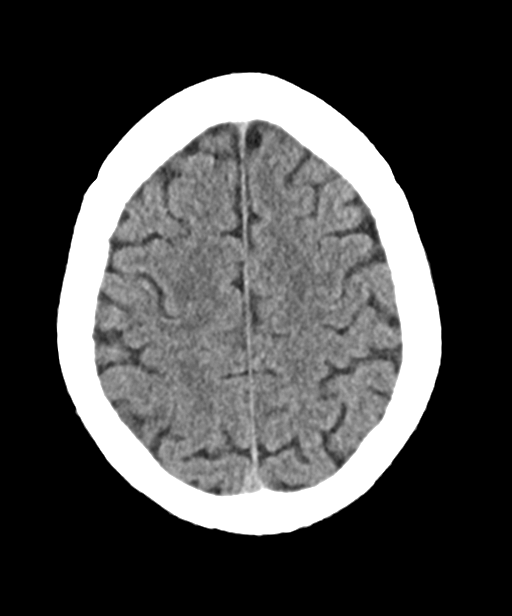
[im 23/27  brain]
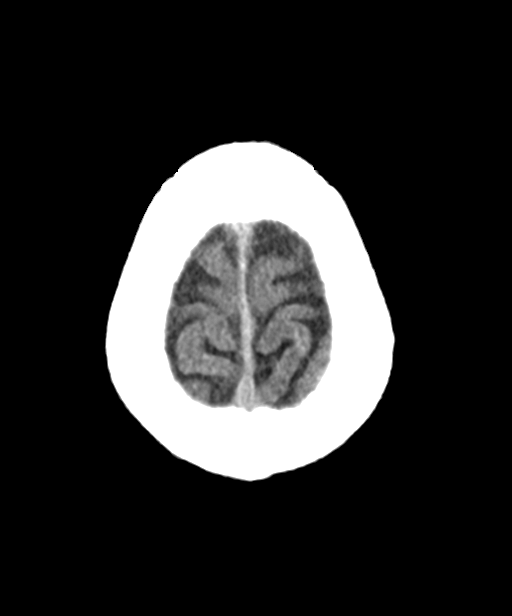
[im 23/27  bone]
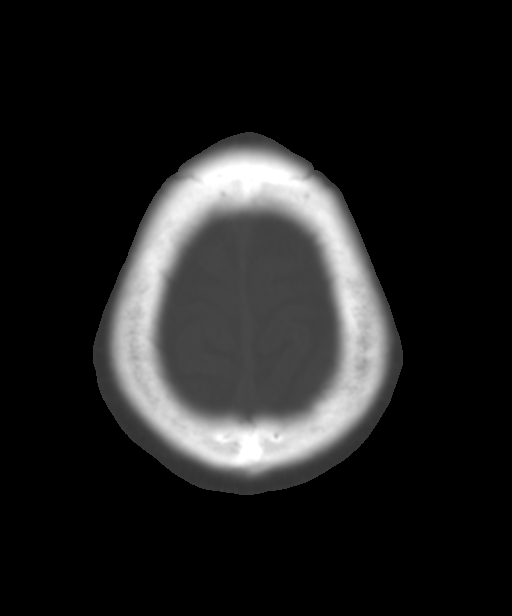
[im 25/27  brain]
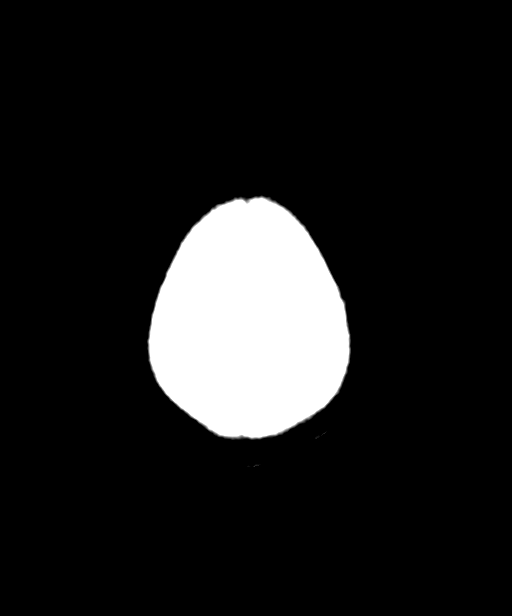

[Series 6: coronals head 3.00 cor · coronal · 0.27mm/px · 3 of 70 slices shown]
[im 24/70  brain]
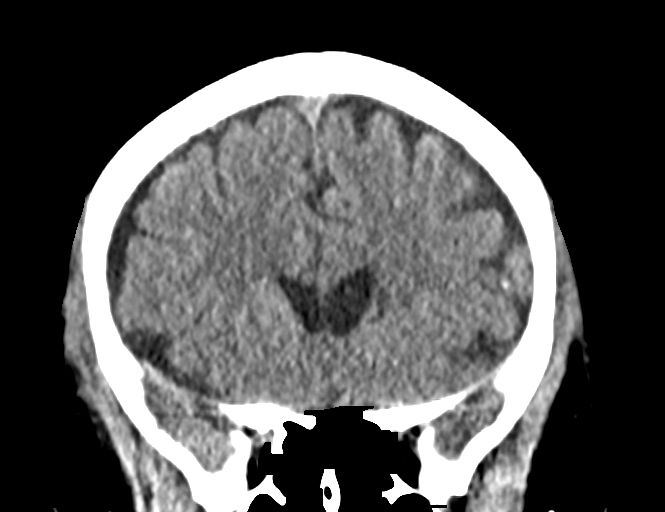
[im 31/70  brain]
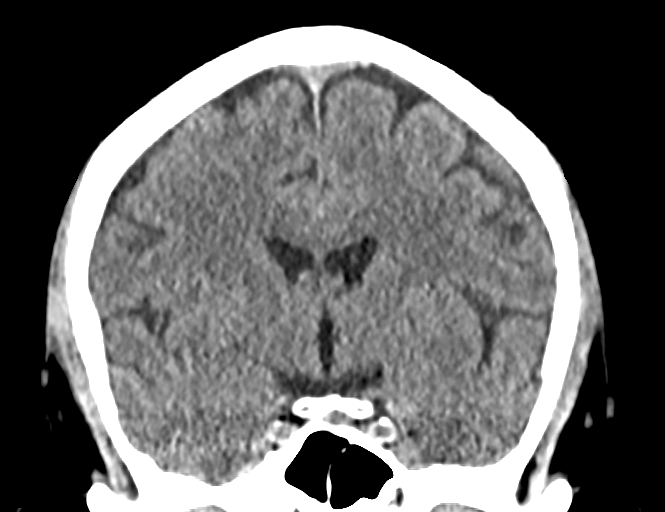
[im 39/70  brain]
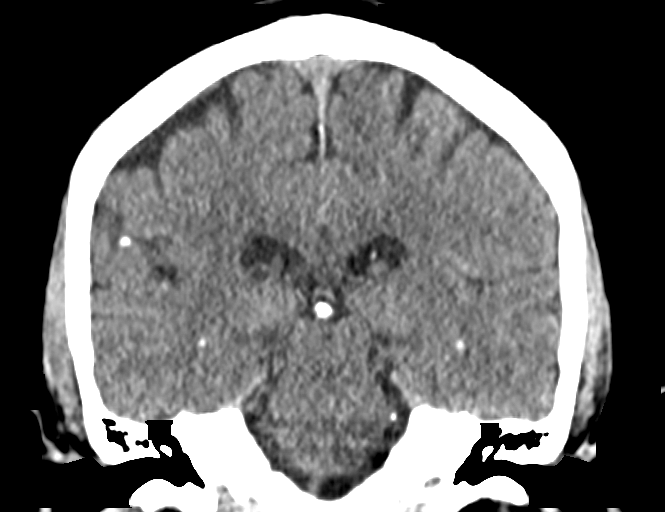

[13 of 47 positions shown; findings below may reference images not displayed]

FINDINGS: Brain: Generalized atrophy. Normal ventricular morphology. No
midline shift or mass effect. Scattered tiny calcifications, many
dural, additional of which are indeterminate but could be
parenchymal. Subacute to old infarct at LEFT caudate head and
anterior limb of LEFT internal capsule. No intracranial hemorrhage,
mass lesion, or evidence of acute infarction. No extra-axial fluid
collections.

Vascular: No hyperdense vessels

Skull: Intact

Sinuses/Orbits: Clear

Other: N/A
IMPRESSION: Subacute to old lacunar infarct at head of LEFT caudate lobe and
anterior limb LEFT internal capsule.

No acute intracranial abnormalities.

Tiny scattered punctate calcifications, many of which are dural but
additional of which are potentially parenchymal; these are
nonspecific but could be seen as a sequela of tiny microemboli or
cysticercosis.

## 2021-10-24 NOTE — Patient Instructions (Incomplete)
Prothrombin Time, International Normalized Ratio Test Why am I having this test? A prothrombin time (pro-time, PT) and international normalized ratio (INR) test measures how long it takes your blood to clot. You may have this test if: You have certain medical conditions that cause abnormal bleeding or blood clotting. These can include: Liver disease. Systemic infection (sepsis). Bleeding disorders that are passed from parent to child (inherited). You are taking a medicine to prevent excessive blood clotting (anticoagulant), such as warfarin. If you are taking warfarin, you will likely be asked to have this test done at regular intervals. The results of this test will help your health care provider determine what dose of warfarin you need based on how quickly or slowly your blood clots. It is very important to have this test done as often as your health care provider recommends. What is being tested? A prothrombin time (pro-time, PT) test measures how many seconds it takes your blood to clot. The international normalized ratio (INR) is a calculation of blood clotting time based on your PT result. Most labs report both PT and INR values when reporting blood clotting times. What kind of sample is taken?  A blood sample is required for this test. It is usually collected by inserting a needle into a blood vessel. It may also be done by taking a blood sample from your finger (fingerstick). Tell a health care provider about: Any bleeding problems you have. All medicines you are taking, including vitamins, herbs, eye drops, creams, and over-the-counter medicines. Do not stop, add, or change any medicines without letting your health care provider know. The foods you regularly eat, especially foods that contain moderate or high amounts of vitamin K. It is important to eat a consistent amount of foods rich in vitamin K. Let your health care provider know if you have recently changed your diet. If you drink  alcohol. This can affect your lab results. How are the results reported? Your test results will be reported as values. Your health care provider will compare your results to normal ranges that were established after testing a large group of people (reference ranges). Reference ranges may vary among labs and hospitals. For this test, common reference ranges are: Without anticoagulant treatment (control value): 11.0-12.5 seconds; 85-100%. INR: 0.8-1.1. If you are taking warfarin, talk with your health care provider about what your INR result should be. Generally, an INR of 2.0-3.0 is desired for blood clot prevention. This depends on your medical conditions. What do the results mean? A higher than normal PT or INR means that your blood takes longer to form a clot. This can result from: Certain medicines, such as antibiotics or warfarin. Liver disease. Lack of certain proteins that form clots (coagulation factors). Lack of some vitamins. A lower than normal PT or INR means that your blood can form a clot easily. This can result from: Supplements that contain vitamin K. Medicines that contain estrogen, such as birth control pills or hormone replacement. Cancer. If you are taking warfarin or another anticoagulant, your results may be different. Talk with your health care provider about what your test results should be and what they mean. Questions to ask your health care provider Ask your health care provider or the department that is doing the test: When will my results be ready? How will I get my results? What are my treatment options? What other tests do I need? What are my next steps? Summary A prothrombin time (pro-time, PT) test measures how many seconds   it takes your blood to clot. The international normalized ratio (INR) is a calculation of blood clotting time based on your PT result. You may have this test if you have a medical condition that causes abnormal bleeding or blood clotting,  or if you are taking a medicine to prevent abnormal blood clotting. A test result that is higher than normal indicates that your blood is taking longer to form a clot. This result may occur because you lack some vitamins, take certain medicines, or have certain medical conditions. If you are taking warfarin, talk with your health care provider about what your result should be. Talk with your health care provider about what your results mean. This information is not intended to replace advice given to you by your health care provider. Make sure you discuss any questions you have with your health care provider. Document Revised: 11/12/2020 Document Reviewed: 11/12/2020 Elsevier Patient Education  2023 Elsevier Inc.  

## 2021-10-29 ENCOUNTER — Encounter: Payer: Self-pay | Admitting: Nurse Practitioner

## 2021-10-29 ENCOUNTER — Ambulatory Visit (INDEPENDENT_AMBULATORY_CARE_PROVIDER_SITE_OTHER): Payer: 59 | Admitting: Nurse Practitioner

## 2021-10-29 VITALS — BP 122/77 | HR 90 | Temp 97.9°F | Ht 68.0 in | Wt 245.4 lb

## 2021-10-29 DIAGNOSIS — Z952 Presence of prosthetic heart valve: Secondary | ICD-10-CM

## 2021-10-29 DIAGNOSIS — Z7901 Long term (current) use of anticoagulants: Secondary | ICD-10-CM

## 2021-10-29 LAB — COAGUCHEK XS/INR WAIVED
INR: 2.4 — ABNORMAL HIGH (ref 0.9–1.1)
Prothrombin Time: 29.1 s

## 2021-10-29 MED ORDER — WARFARIN SODIUM 4 MG PO TABS: 4.0000 mg | ORAL_TABLET | Freq: Every day | ORAL | 4 refills | Status: DC

## 2021-11-12 ENCOUNTER — Other Ambulatory Visit: Payer: Self-pay | Admitting: Nurse Practitioner

## 2021-11-15 NOTE — Telephone Encounter (Signed)
Requested medication (s) are due for refill today: Yes  Requested medication (s) are on the active medication list: yes    Last refill: 07/14/21  30G  0 refills  Future visit scheduled yes 11/26/21   Notes to clinic:Not delegated, please review  Requested Prescriptions  Pending Prescriptions Disp Refills   triamcinolone cream (KENALOG) 0.1 % [Pharmacy Med Name: TRIAMCINOLONE 0.1% CREAM] 30 g 0    Sig: Apply 1 application. topically 2 (two) times daily.     Not Delegated - Dermatology:  Corticosteroids Failed - 11/12/2021  6:05 PM      Failed - This refill cannot be delegated      Passed - Valid encounter within last 12 months    Recent Outpatient Visits           2 weeks ago History of mechanical aortic valve replacement   [facility] [place], Jolene T, NP   1 month ago Type 2 diabetes mellitus with proteinuria (HCC)   [facility] [place], Jolene T, NP   2 months ago Type 2 diabetes mellitus with proteinuria (HCC)   [facility] [place], [place] T, NP   3 months ago History of mechanical aortic valve replacement   [facility] [place], Corrie Dandy T, NP   3 months ago Acute diffuse otitis externa of left ear   [facility] Gabriel Cirri, NP       Future Appointments             In 1 week Cannady, [name], NP [facility], [facility]

## 2021-11-21 NOTE — Patient Instructions (Signed)
Vitamin K Foods and Warfarin Warfarin is a blood thinner (anticoagulant). Anticoagulant medicines help prevent blood clots from forming or getting bigger. Warfarin works by blocking the activity of vitamin K. Vitamin K promotes normal blood clotting. When you take warfarin, problems can occur from suddenly increasing or decreasing the amount of vitamin K that you eat from one day to the next. These problems can occur due to varying levels of warfarin in your blood. Problems may include blood clots or bleeding. What are tips for eating the right amount of vitamin K? Reading food labels Know which foods contain vitamin K. Read food labels. Use the lists below to understand serving sizes and the amount of vitamin K in one serving. If you take a multivitamin that contains vitamin K, be sure to take it every day. Meal planning To avoid problems when taking warfarin: Eat a balanced diet that includes: Fresh fruits and vegetables. Whole grains. Low-fat dairy products. Lean proteins, such as fish, eggs, and lean cuts of meat. Avoid major changes in your diet. If you are going to change your diet, talk with your health care provider before making changes. Keep your intake of vitamin K consistent from day to day. Avoid eating large amounts of vitamin K one day and small amounts of vitamin K the next day. Work with a dietitian to develop a meal plan that works best for you.  What foods are high in vitamin K? Foods that are high in vitamin K contain more than 100 mcg (micrograms) per serving. These include: Broccoli (cooked from fresh) -  cup (78 g) has 110 mcg. Brussels sprouts (cooked from fresh) -  cup (78 g) has 109 mcg. Greens, beet (cooked from fresh) -  cup (72 g) has 350 mcg. Greens, collard (cooked from fresh) -  cup (66 g) has 263 mcg. Greens, turnip (cooked from fresh) -  cup (72 g) has 265 mcg. Green onions or scallions -  cup (50 g) has 105 mcg. Kale (cooked from fresh) -  cup (68  g) has 536 mcg. Parsley (raw) - 10 sprigs (10 g) has 164 mcg. Spinach (cooked from fresh) -  cup (90 g) has 444 mcg. Swiss chard (cooked from fresh) -  cup (88 g) has 287 mcg. The items listed above may not be a complete list of foods high in Vitamin K. Actual amounts of Vitamin K may differ depending on processing. Contact a dietitian for more information. What foods have a moderate amount of vitamin K? Foods that have a moderate amount of vitamin K contain 25-100 mcg per serving. These include: Asparagus (cooked from fresh) - 4 spears (60 g) have 30 mcg. Black-eyed peas (dried) -  cup (85 g) has 32 mcg. Cabbage (cooked from fresh) -  cup (78 g) has 84 mcg. Cabbage (raw) -  cup (35 g) has 26 mcg. Kiwi fruit - 1 medium (69 g) has 27 mcg. Lettuce (raw) - 1 cup (36 g) has 45 mcg. Okra (cooked from fresh) -  cup (80 g) has 32 mcg. Prunes (dried) - 5 prunes (47 g) have 25 mcg. Tuna, light, canned in oil - 3 oz (85 g) has 37 mcg. Watercress (raw) - 1 cup (34 g) has 85 mcg. The items listed above may not be a complete list of foods with a moderate amount of Vitamin K. Actual amounts of Vitamin K may differ depending on processing. Contact a dietitian for more information. What foods are low in vitamin K? Foods low in   vitamin K contain less than 25 mcg per serving. These include: Artichoke - 1 medium (128 g) has 18 mcg. Avocado - 1 oz (21 g) has 6 mcg. Blueberries -  cup (73 g) has 14 mcg. Carrots (cooked from fresh) -  cup (78 g) has 11 mcg. Cauliflower (raw) -  cup (54 g) has 8 mcg. Cucumber with peel (raw) -  cup (52 g) has 9 mcg. Grapes -  cup (76 g) has 12 mcg. Mango - 1 medium (207 g) has 9 mcg. Mixed nuts - 1 cup (142 g) has 17 mcg. Pear - 1 medium (178 g) has 8 mcg. Peas (cooked from fresh) -  cup (80 g) has 20 mcg. Pickled cucumber - 1 spear (65 g) has 11 mcg. Sauerkraut (canned) -  cup (118 g) has 16 mcg. Soybeans (cooked from fresh) -  cup (86 g) has 16 mcg. Tomato  (raw) - 1 medium (123 g) has 10 mcg. Tomato sauce (raw) -  cup (123 g) has 17 mcg. The items listed above may not be a complete list of foods low in Vitamin K. Actual amounts of Vitamin K may differ depending on processing. Contact a dietitian for more information. What foods do not have vitamin K? If a food contains less than 5 mcg per serving, it is considered to have no vitamin K. These foods include: Bread and cereal products. Cheese. Eggs. Fish and shellfish. Meat and poultry. Milk and dairy products. Seeds, such as sunflower or pumpkin seeds. The items listed above may not be a complete list of foods that do not have vitamin K. Actual amounts of vitamin K may differ depending on processing. Contact a dietitian for more information. Summary Warfarin is an anticoagulant that prevents blood clots from forming or getting bigger by blocking the activity of vitamin K. It is important to know the amount of vitamin K that is in the foods you eat and to keep your intake of vitamin K consistent from day to day. Avoid major changes in your diet. If you are going to change your diet, talk with your health care provider before making changes. This information is not intended to replace advice given to you by your health care provider. Make sure you discuss any questions you have with your health care provider. Document Revised: 07/01/2020 Document Reviewed: 07/01/2020 Elsevier Patient Education  2023 Elsevier Inc.  

## 2021-11-26 ENCOUNTER — Encounter: Payer: Self-pay | Admitting: Nurse Practitioner

## 2021-11-26 ENCOUNTER — Ambulatory Visit (INDEPENDENT_AMBULATORY_CARE_PROVIDER_SITE_OTHER): Payer: 59 | Admitting: Nurse Practitioner

## 2021-11-26 VITALS — BP 133/81 | HR 88 | Temp 97.8°F | Ht 68.0 in | Wt 246.7 lb

## 2021-11-26 DIAGNOSIS — D6869 Other thrombophilia: Secondary | ICD-10-CM

## 2021-11-26 DIAGNOSIS — Z952 Presence of prosthetic heart valve: Secondary | ICD-10-CM

## 2021-11-26 DIAGNOSIS — Z7901 Long term (current) use of anticoagulants: Secondary | ICD-10-CM | POA: Diagnosis not present

## 2021-11-26 DIAGNOSIS — I482 Chronic atrial fibrillation, unspecified: Secondary | ICD-10-CM

## 2021-11-26 LAB — COAGUCHEK XS/INR WAIVED
INR: 3.1 — ABNORMAL HIGH (ref 0.9–1.1)
Prothrombin Time: 36.8 s

## 2021-11-26 NOTE — Assessment & Plan Note (Signed)
Refer to mechanical valve plan of care. 

## 2021-11-26 NOTE — Progress Notes (Deleted)
   There were no vitals taken for this visit.   Subjective:    Patient ID: Rodney Chary [name], male    DOB: 1960-12-15, 61 y.o.   MRN: [id number]  CC: Coumadin management  HPI: This patient is a 61 y.o. male who presents for coumadin management. The expected duration of coumadin treatment is lifelong The reason for anticoagulation is  mechanical heart valve.  Present Coumadin dose:  9 MG two days a week and 10 MG remainder Goal: 2.5-3.5 The patient does not have an active anticoagulation episode. Excessive bruising: {Blank single:19197::"yes","no"} Nose bleeding: {Blank single:19197::"yes","no"} Rectal bleeding: {Blank single:19197::"yes","no"} Prolonged menstrual cycles: {Blank single:19197::"yes","no","N/A"} Eating diet with consistent amounts of foods containing Vitamin K:{Blank single:19197::"yes","no"} Any recent antibiotic use? {Blank single:19197::"yes","no"}  Relevant past medical, surgical, family and social history reviewed and updated as indicated. Interim medical history since our last visit reviewed. Allergies and medications reviewed and updated.  ROS: Per HPI unless specifically indicated above     Objective:    There were no vitals taken for this visit.  Wt Readings from Last 3 Encounters:  10/29/21 245 lb 6.4 oz (111.3 kg)  09/24/21 240 lb 6.4 oz (109 kg)  08/27/21 238 lb 12.8 oz (108.3 kg)     General: Well appearing, well nourished in no distress.  Normal mood and affect. Skin: No excessive bruising or rash  Last INR: 2.4    Last CBC:  Lab Results  Component Value Date   WBC 8.9 09/24/2021   HGB 15.8 09/24/2021   HCT 45.7 09/24/2021   MCV 84 09/24/2021   PLT 216 09/24/2021    Results for orders placed or performed in visit on 10/29/21  CoaguChek XS/INR Waived  Result Value Ref Range   INR 2.4 (H) 0.9 - 1.1   Prothrombin Time 29.1 sec       Assessment:     ICD-10-CM   1. History of mechanical aortic valve replacement  Z95.2 CoaguChek  XS/INR Waived    2. Current use of long term anticoagulation  Z79.01 CoaguChek XS/INR Waived      Plan:   Discussed current plan face-to-face with patient. For coumadin dosing, elected to {Blank single:19197::"continue current dose","change dose to","hold dose"}. Will plan to recheck INR in {Blank single:19197::"1 month","1 week","2 weeks"}.

## 2021-11-26 NOTE — Assessment & Plan Note (Signed)
Chronic, ongoing with Coumadin use.  INR today 3.1, at goal 2.5 to 3.5. Elected to continue 9 MG two days a week and 10 MG remainder. Return in 4 weeks for INR check.  Return sooner to office if any bleeding or concerns.

## 2021-11-26 NOTE — Progress Notes (Signed)
BP 133/81   Pulse 88   Temp 97.8 F (36.6 C) (Oral)   Ht 5\' 8"  (1.727 m)   Wt 246 lb 11.2 oz (111.9 kg)   SpO2 97%   BMI 37.51 kg/m    Subjective:    Patient ID: [name], male    DOB: [birth year], 61 y.o.   MRN: [id number]  NOTE WRITTEN BY FNP STUDENT.  ASSESSMENT AND PLAN OF CARE REVIEWED WITH STUDENT, AGREE WITH ABOVE FINDINGS AND PLAN.   CC: Coumadin management  HPI: This patient is a 61 y.o. male who presents for coumadin management. The expected duration of coumadin treatment is lifelong The reason for anticoagulation is  mechanical heart valve.  Present Coumadin dose:  9 MG two days a week and 10 MG remainder Goal: 2.5-3.5 The patient does not have an active anticoagulation episode. Excessive bruising: no Nose bleeding: no Rectal bleeding: no Eating diet with consistent amounts of foods containing Vitamin K:no Any recent antibiotic use? no  Relevant past medical, surgical, family and social history reviewed and updated as indicated. Interim medical history since our last visit reviewed. Allergies and medications reviewed and updated.  ROS: Per HPI unless specifically indicated above     Objective:    BP 133/81   Pulse 88   Temp 97.8 F (36.6 C) (Oral)   Ht 5\' 8"  (1.727 m)   Wt 246 lb 11.2 oz (111.9 kg)   SpO2 97%   BMI 37.51 kg/m   Wt Readings from Last 3 Encounters:  11/26/21 246 lb 11.2 oz (111.9 kg)  10/29/21 245 lb 6.4 oz (111.3 kg)  09/24/21 240 lb 6.4 oz (109 kg)     General: Well appearing, well nourished in no distress.  Normal mood and affect. Skin: No excessive bruising or rash  Last INR: 2.4    Last CBC:  Lab Results  Component Value Date   WBC 8.9 09/24/2021   HGB 15.8 09/24/2021   HCT 45.7 09/24/2021   MCV 84 09/24/2021   PLT 216 09/24/2021    Results for orders placed or performed in visit on 11/26/21  CoaguChek XS/INR Waived  Result Value Ref Range   INR 3.1 (H) 0.9 - 1.1   Prothrombin Time 36.8 sec     Physical  Exam Vitals and nursing note reviewed.  Constitutional:      General: He is not in acute distress. HENT:     Head: Normocephalic.     Right Ear: Hearing normal.     Left Ear: Hearing normal.  Eyes:     General: Lids are normal.  Neck:     Vascular: No carotid bruit.  Cardiovascular:     Rate and Rhythm: Normal rate and regular rhythm.     Pulses: Normal pulses.     Heart sounds: Normal heart sounds.  Pulmonary:     Effort: Pulmonary effort is normal. No respiratory distress.     Breath sounds: Normal breath sounds.  Lymphadenopathy:     Cervical: No cervical adenopathy.  Neurological:     Mental Status: He is alert.  Psychiatric:        Attention and Perception: Attention normal.        Mood and Affect: Mood normal.        Speech: Speech normal.        Behavior: Behavior is cooperative.        Cognition and Memory: Cognition normal.        Judgment: Judgment normal.  Assessment:     ICD-10-CM   1. History of mechanical aortic valve replacement  Z95.2 CoaguChek XS/INR Waived    2. Current use of long term anticoagulation  Z79.01 CoaguChek XS/INR Waived    3. Chronic atrial fibrillation (HCC)  I48.20     4. Acquired thrombophilia (HCC)  D68.69       Plan:   Discussed current plan face-to-face with patient. For coumadin dosing, to continue 9 MG two days a week and 10 MG remainder. Will plan to recheck INR in 1 month.

## 2021-11-26 NOTE — Assessment & Plan Note (Signed)
Chronic, ongoing with Coumadin use.  INR today 3.1, at goal 2.5 to 3.5. Continue 9 MG two days a week and 10 MG remainder.  Return in 4 weeks for INR check.  Return sooner to office if any bleeding or concerns.

## 2021-12-22 ENCOUNTER — Other Ambulatory Visit: Payer: Self-pay | Admitting: Nurse Practitioner

## 2021-12-22 DIAGNOSIS — E119 Type 2 diabetes mellitus without complications: Secondary | ICD-10-CM

## 2021-12-22 NOTE — Telephone Encounter (Signed)
Requested Prescriptions  Pending Prescriptions Disp Refills  . FARXIGA 10 MG TABS tablet [Pharmacy Med Name: FARXIGA 10 MG TABLET] 90 tablet 0    Sig: TAKE 1 TABLET BY MOUTH EVERY DAY     Endocrinology:  Diabetes - SGLT2 Inhibitors Passed - 12/22/2021  1:54 AM      Passed - Cr in normal range and within 360 days    Creatinine, Ser  Date Value Ref Range Status  09/24/2021 0.99 0.76 - 1.27 mg/dL Final         Passed - HBA1C is between 0 and 7.9 and within 180 days    Hemoglobin A1C  Date Value Ref Range Status  12/16/2015 10.4  Final   HB A1C (BAYER DCA - WAIVED)  Date Value Ref Range Status  09/24/2021 7.5 (H) 4.8 - 5.6 % Final    Comment:             Prediabetes: 5.7 - 6.4          Diabetes: >6.4          Glycemic control for adults with diabetes: <7.0          Passed - eGFR in normal range and within 360 days    GFR calc Af Amer  Date Value Ref Range Status  06/17/2020 77 >59 mL/min/1.73 Final    Comment:    **In accordance with recommendations from the NKF-ASN Task force,**   [facility] is in the process of updating its eGFR calculation to the   2021 CKD-EPI creatinine equation that estimates kidney function   without a race variable.    GFR calc non Af Amer  Date Value Ref Range Status  06/17/2020 67 >59 mL/min/1.73 Final   eGFR  Date Value Ref Range Status  09/24/2021 87 >59 mL/min/1.73 Final         Passed - Valid encounter within last 6 months    Recent Outpatient Visits          3 weeks ago History of mechanical aortic valve replacement   [facility] [place], Henrine Screws T, NP   1 month ago History of mechanical aortic valve replacement   [facility] [place], Jolene T, NP   2 months ago Type 2 diabetes mellitus with proteinuria ([place])   [place], Jolene T, NP   3 months ago Type 2 diabetes mellitus with proteinuria ([place])   [place], Jolene T, NP   4 months ago History of mechanical aortic  valve replacement   [place], Barbaraann Faster, NP      Future Appointments            In 1 week Cannady, Barbaraann Faster, NP [facility] [employer], [facility]

## 2021-12-26 NOTE — Patient Instructions (Addendum)
Your cardiology appointment is 01/24/22 at [facility], call to ask time.  Call Dr. Patrecia Pace for diabetes visit  [phone]  Coumadin hold this evening and then get back on schedule take 9 MG two days a week and 10 MG remainder  Diabetes Mellitus and Standards of Medical Care Living with and managing diabetes (diabetes mellitus) can be complicated. Your diabetes treatment may be managed by a team of health care providers, including: A physician who specializes in diabetes (endocrinologist). You might also have visits with a nurse practitioner or physician assistant. Nurses. A registered dietitian. A certified diabetes care and education specialist. An exercise specialist. A pharmacist. An eye doctor. A foot specialist (podiatrist). A dental care provider. A primary care provider. A mental health care provider. How to manage your diabetes You can do many things to successfully manage your diabetes. Your health care providers will follow guidelines to help you get the best quality of care. Here are general guidelines for your diabetes management plan. Your health care providers may give you more specific instructions. Physical exams When you are diagnosed with diabetes, and each year after that, your health care provider will ask about your medical and family history. You will have a physical exam, which may include: Measuring your height, weight, and body mass index (BMI). Checking your blood pressure. This will be done at every routine medical visit. Your target blood pressure may vary depending on your medical conditions, your age, and other factors. A thyroid exam. A skin exam. Screening for nerve damage (peripheral neuropathy). This may include checking the pulse in your legs and feet and the level of sensation in your hands and feet. A foot exam to inspect the structure and skin of your feet, including checking for cuts, bruises, redness, blisters, sores, or other  problems. Screening for blood vessel (vascular) problems. This may include checking the pulse in your legs and feet and checking your temperature. Blood tests Depending on your treatment plan and your personal needs, you may have the following tests: Hemoglobin A1C (HbA1C). This test provides information about blood sugar (glucose) control over the previous 2-3 months. It is used to adjust your treatment plan, if needed. This test will be done: At least 2 times a year, if you are meeting your treatment goals. 4 times a year, if you are not meeting your treatment goals or if your goals have changed. Lipid testing, including total cholesterol, LDL and HDL cholesterol, and triglyceride levels. The goal for LDL is less than 100 mg/dL (5.5 mmol/L). If you are at high risk for complications, the goal is less than 70 mg/dL (3.9 mmol/L). The goal for HDL is 40 mg/dL (2.2 mmol/L) or higher for men, and 50 mg/dL (2.8 mmol/L) or higher for women. An HDL cholesterol of 60 mg/dL (3.3 mmol/L) or higher gives some protection against heart disease. The goal for triglycerides is less than 150 mg/dL (8.3 mmol/L). Liver function tests. Kidney function tests. Thyroid function tests.  Dental and eye exams  Visit your dentist two times a year. If you have type 1 diabetes, your health care provider may recommend an eye exam within 5 years after you are diagnosed, and then once a year after your first exam. For children with type 1 diabetes, the health care provider may recommend an eye exam when your child is age 11 or older and has had diabetes for 3-5 years. After the first exam, your child should get an eye exam once a year. If you have  type 2 diabetes, your health care provider may recommend an eye exam as soon as you are diagnosed, and then every 1-2 years after your first exam. Immunizations A yearly flu (influenza) vaccine is recommended annually for everyone 6 months or older. This is especially important if  you have diabetes. The pneumonia (pneumococcal) vaccine is recommended for everyone 2 years or older who has diabetes. If you are age 83 or older, you may get the pneumonia vaccine as a series of two separate shots. The hepatitis B vaccine is recommended for adults shortly after being diagnosed with diabetes. Adults and children with diabetes should receive all other vaccines according to age-specific recommendations from the Centers for Disease Control and Prevention (CDC). Mental and emotional health Screening for symptoms of eating disorders, anxiety, and depression is recommended at the time of diagnosis and after as needed. If your screening shows that you have symptoms, you may need more evaluation. You may work with a mental health care provider. Follow these instructions at home: Treatment plan You will monitor your blood glucose levels and may give yourself insulin. Your treatment plan will be reviewed at every medical visit. You and your health care provider will discuss: How you are taking your medicines, including insulin. Any side effects you have. Your blood glucose level target goals. How often you monitor your blood glucose level. Lifestyle habits, such as activity level and tobacco, alcohol, and substance use. Education Your health care provider will assess how well you are monitoring your blood glucose levels and whether you are taking your insulin and medicines correctly. He or she may refer you to: A certified diabetes care and education specialist to manage your diabetes throughout your life, starting at diagnosis. A registered dietitian who can create and review your personal nutrition plan. An exercise specialist who can discuss your activity level and exercise plan. General instructions Take over-the-counter and prescription medicines only as told by your health care provider. Keep all follow-up visits. This is important. Where to find support There are many diabetes  support networks, including: American Diabetes Association (ADA): diabetes.org Defeat Diabetes Foundation: defeatdiabetes.org Where to find more information American Diabetes Association (ADA): www.diabetes.org Association of Diabetes Care & Education Specialists (ADCES): diabeteseducator.org International Diabetes Federation (IDF): http://hill.biz/ Summary Managing diabetes (diabetes mellitus) can be complicated. Your diabetes treatment may be managed by a team of health care providers. Your health care providers follow guidelines to help you get the best quality care. You should have physical exams, blood tests, blood pressure monitoring, immunizations, and screening tests regularly. Stay updated on how to manage your diabetes. Your health care providers may also give you more specific instructions based on your individual health. This information is not intended to replace advice given to you by your health care provider. Make sure you discuss any questions you have with your health care provider. Document Revised: 10/31/2019 Document Reviewed: 10/31/2019 Elsevier Patient Education  2023 [employer].

## 2021-12-31 ENCOUNTER — Encounter: Payer: Self-pay | Admitting: Nurse Practitioner

## 2021-12-31 ENCOUNTER — Ambulatory Visit (INDEPENDENT_AMBULATORY_CARE_PROVIDER_SITE_OTHER): Payer: 59 | Admitting: Nurse Practitioner

## 2021-12-31 VITALS — BP 133/82 | HR 89 | Temp 97.9°F | Ht 68.0 in | Wt 247.7 lb

## 2021-12-31 DIAGNOSIS — I482 Chronic atrial fibrillation, unspecified: Secondary | ICD-10-CM

## 2021-12-31 DIAGNOSIS — N1831 Chronic kidney disease, stage 3a: Secondary | ICD-10-CM

## 2021-12-31 DIAGNOSIS — Z91148 Patient's other noncompliance with medication regimen for other reason: Secondary | ICD-10-CM

## 2021-12-31 DIAGNOSIS — Z952 Presence of prosthetic heart valve: Secondary | ICD-10-CM

## 2021-12-31 DIAGNOSIS — E1129 Type 2 diabetes mellitus with other diabetic kidney complication: Secondary | ICD-10-CM | POA: Diagnosis not present

## 2021-12-31 DIAGNOSIS — I152 Hypertension secondary to endocrine disorders: Secondary | ICD-10-CM

## 2021-12-31 DIAGNOSIS — E1122 Type 2 diabetes mellitus with diabetic chronic kidney disease: Secondary | ICD-10-CM | POA: Diagnosis not present

## 2021-12-31 DIAGNOSIS — E1159 Type 2 diabetes mellitus with other circulatory complications: Secondary | ICD-10-CM

## 2021-12-31 DIAGNOSIS — Z8673 Personal history of transient ischemic attack (TIA), and cerebral infarction without residual deficits: Secondary | ICD-10-CM

## 2021-12-31 DIAGNOSIS — E1142 Type 2 diabetes mellitus with diabetic polyneuropathy: Secondary | ICD-10-CM | POA: Diagnosis not present

## 2021-12-31 DIAGNOSIS — Z7901 Long term (current) use of anticoagulants: Secondary | ICD-10-CM

## 2021-12-31 DIAGNOSIS — Z794 Long term (current) use of insulin: Secondary | ICD-10-CM

## 2021-12-31 DIAGNOSIS — E785 Hyperlipidemia, unspecified: Secondary | ICD-10-CM

## 2021-12-31 DIAGNOSIS — E1169 Type 2 diabetes mellitus with other specified complication: Secondary | ICD-10-CM

## 2021-12-31 DIAGNOSIS — R809 Proteinuria, unspecified: Secondary | ICD-10-CM

## 2021-12-31 DIAGNOSIS — D6869 Other thrombophilia: Secondary | ICD-10-CM

## 2021-12-31 LAB — COAGUCHEK XS/INR WAIVED
INR: 5 (ref 0.9–1.1)
Prothrombin Time: 59.9 s

## 2021-12-31 LAB — BAYER DCA HB A1C WAIVED: HB A1C (BAYER DCA - WAIVED): 7.1 % — ABNORMAL HIGH (ref 4.8–5.6)

## 2021-12-31 MED ORDER — TAMSULOSIN HCL 0.4 MG PO CAPS: 0.4000 mg | ORAL_CAPSULE | Freq: Every day | ORAL | 3 refills | Status: DC

## 2021-12-31 NOTE — Assessment & Plan Note (Signed)
Monitor CBC with use of daily Coumadin for mechanical valve and a-fib.  Denies any current bleeding episodes.  Check CBC annually. 

## 2021-12-31 NOTE — Assessment & Plan Note (Signed)
Chronic, ongoing with Coumadin use.  INR today 5, not at goal 2.5 to 3.5 as was taking medication incorrectly recently. Hold Coumadin dose tonight and then return to 9 MG two days a week and 10 MG remainder.  Return in 1 week for INR check.  Return sooner to office if any bleeding or concerns.

## 2021-12-31 NOTE — Assessment & Plan Note (Signed)
Discussed at length stroke prevention goals BP <130/90, A1c <6.5%, and LDL <70.  He continues to have poor diabetes control, which have discussed at length with him -- much is related to poor diet choices. 

## 2021-12-31 NOTE — Progress Notes (Signed)
BP 133/82   Pulse 89   Temp 97.9 F (36.6 C) (Oral)   Ht 5\' 8"  (1.727 m)   Wt 247 lb 11.2 oz (112.4 kg)   SpO2 94%   BMI 37.66 kg/m    Subjective:    Patient ID: [name], male    DOB: 10/25/1960, 61 y.o.   MRN: [id number]  HPI: Rodney Dixon. is a 61 y.o. male  Chief Complaint  Patient presents with   Diabetes    Patient declines having a recent Diabetic Eye Exam.    Hyperlipidemia   Hypertension   INR Check   Atrial Fibrillation   Chronic Kidney Disease   DIABETES LAst visit with Dr. 77 01/26/21 and continues on current medications: Farxiga, NPH, Ozempic 1 MG -- A1c 7.5%.   Has not returned to endo, missed December visit.  Have discussed with him at all recent visits to reschedule, which he has not.  On visit 08/27/21 placed referral to Dr. 08/29/21 locally, as patient reported it would be easier to have provider here and [facility] discharged him.  Has not scheduled as of yet.  He has endorsed being an "eater" + enjoying sweets.   Continues on Gabapentin -- taking 900 MG in the morning, 900 MG at lunch, and then 1200 MG before bed.   Hypoglycemic episodes: no Polydipsia/polyuria: no Visual disturbance: no Chest pain: no Paresthesias: no Glucose Monitoring: yes  Accucheck frequency:  occasionally  Fasting glucose: he is unsure  Post prandial:  Evening:  Before meals: Taking Insulin?: yes  Long acting insulin:  Short acting insulin: Blood Pressure Monitoring: not checking Retinal Examination: Not up to Date Foot Exam: Up to Date Pneumovax: Up to Date Influenza: Up to Date Aspirin: no   HYPERTENSION / HYPERLIPIDEMIA Saw cardiology last 12/15/20 -- they continue to recommend sleep study and he has not obtained -- he is scheduled to return 01/24/22.  He is to continue Benazepril, HCTZ and Carvedilol. Continues on Atorvastatin and Fenofibrate for HLD. Last echo noted normal LV function.  Satisfied with current treatment? yes Duration of  hypertension: chronic BP monitoring frequency: not checking BP range:  BP medication side effects: no Duration of hyperlipidemia: chronic Cholesterol medication side effects: no Cholesterol supplements: none Medication compliance: good compliance Aspirin: no Recent stressors: no Recurrent headaches: no Visual changes: no Palpitations: no Dyspnea: no Chest pain: no Lower extremity edema: no Dizzy/lightheaded: no   CHRONIC KIDNEY DISEASE Recent labs have improved levels. CKD status: stable Medications renally dose: yes Previous renal evaluation: no Pneumovax:  Up to Date Influenza Vaccine:  Up to Date   BPH Has noticed recent symptoms.  Over past couple months.  Last PSA October 2022 - 0.4. BPH status: uncontrolled Duration: months Nocturia: 1-2x per night Urinary frequency:yes Incomplete voiding: no Urgency: none Weak urinary stream: yes Straining to start stream: yes Dysuria: no Onset: gradual Severity: mild Alleviating factors: none Aggravating factors: drinking lots of fluid Treatments attempted:  IPSS Questionnaire (AUA-7): 10 today Over the past month.   1)  How often have you had a sensation of not emptying your bladder completely after you finish urinating?  0 - Not at all  2)  How often have you had to urinate again less than two hours after you finished urinating? 2 - Less than half the time  3)  How often have you found you stopped and started again several times when you urinated?  2 - Less than half the time  4) How difficult have you found it to postpone urination?  0 - Not at all  5) How often have you had a weak urinary stream?  2 - Less than half the time  6) How often have you had to push or strain to begin urination?  2 - Less than half the time  7) How many times did you most typically get up to urinate from the time you went to bed until the time you got up in the morning?  2 - 2 times  Total score:  0-7 mildly symptomatic   8-19 moderately  symptomatic   20-35 severely symptomatic    ATRIAL FIBRILLATION Currently takes 9 MG two days a week and 10 MG remainder with last INR 3.1.  He reports he has accidentally been taking 10 MG daily for the last week or so -- INR today is 5 which is elevated from past readings which were stable on 9/10 regimen.   Saw neurology on 03/30/21 for history of stroke.  Has dizziness ongoing on occasion -- recent CT remains reassuring on 05/12/21 and has not returned to neurology as recommended. Atrial fibrillation status: stable Satisfied with current treatment: yes  Medication side effects:  no Medication compliance: good compliance Etiology of atrial fibrillation: unknown Palpitations:  no Chest pain:  no Dyspnea on exertion:  no Orthopnea:  no Syncope:  no Edema:  no Ventricular rate control: B-blocker Anti-coagulation: warfarin   Relevant past medical, surgical, family and social history reviewed and updated as indicated. Interim medical history since our last visit reviewed. Allergies and medications reviewed and updated.  Review of Systems  Constitutional:  Negative for activity change, diaphoresis, fatigue and fever.  Respiratory:  Negative for cough, chest tightness, shortness of breath and wheezing.   Cardiovascular:  Negative for chest pain, palpitations and leg swelling.  Gastrointestinal: Negative.   Endocrine: Negative for polydipsia, polyphagia and polyuria.  Genitourinary:  Positive for frequency. Negative for dysuria and urgency.  Neurological: Negative.   Psychiatric/Behavioral: Negative.      Per HPI unless specifically indicated above     Objective:    BP 133/82   Pulse 89   Temp 97.9 F (36.6 C) (Oral)   Ht 5\' 8"  (1.727 m)   Wt 247 lb 11.2 oz (112.4 kg)   SpO2 94%   BMI 37.66 kg/m   Wt Readings from Last 3 Encounters:  12/31/21 247 lb 11.2 oz (112.4 kg)  11/26/21 246 lb 11.2 oz (111.9 kg)  10/29/21 245 lb 6.4 oz (111.3 kg)    Physical Exam Vitals and  nursing note reviewed.  Constitutional:      General: He is awake. He is not in acute distress.    Appearance: He is well-developed and well-groomed. He is morbidly obese. He is not ill-appearing.  HENT:     Head: Normocephalic and atraumatic.     Right Ear: Hearing normal. No drainage.     Left Ear: Hearing normal. No drainage.  Eyes:     General: Lids are normal.        Right eye: No discharge.        Left eye: No discharge.     Conjunctiva/sclera: Conjunctivae normal.     Pupils: Pupils are equal, round, and reactive to light.  Neck:     Thyroid: No thyromegaly.     Vascular: No carotid bruit or JVD.  Cardiovascular:     Rate and Rhythm: Normal rate and regular rhythm.     Heart  sounds: Normal heart sounds, S1 normal and S2 normal. No murmur heard.    No gallop.  Pulmonary:     Effort: Pulmonary effort is normal. No accessory muscle usage or respiratory distress.     Breath sounds: Normal breath sounds.  Abdominal:     General: Bowel sounds are normal.     Palpations: Abdomen is soft.  Genitourinary:    Comments: Refuses prostate exam today, will consider at next visit. Musculoskeletal:        General: Normal range of motion.     Right shoulder: Normal.     Left shoulder: Normal.     Cervical back: Normal range of motion and neck supple.     Right lower leg: No edema.     Left lower leg: No edema.  Skin:    General: Skin is warm and dry.     Capillary Refill: Capillary refill takes less than 2 seconds.     Findings: No rash.  Neurological:     Mental Status: He is alert and oriented to person, place, and time.     Deep Tendon Reflexes: Reflexes are normal and symmetric.  Psychiatric:        Attention and Perception: Attention normal.        Mood and Affect: Mood normal.        Speech: Speech normal.        Behavior: Behavior normal. Behavior is cooperative.        Thought Content: Thought content normal.    Results for orders placed or performed in visit on  11/26/21  CoaguChek XS/INR Waived  Result Value Ref Range   INR 3.1 (H) 0.9 - 1.1   Prothrombin Time 36.8 sec      Assessment & Plan:   Problem List Items Addressed This Visit       Cardiovascular and Mediastinum   Chronic atrial fibrillation (HCC)    Chronic, ongoing with Coumadin use.  INR today 5, not at goal 2.5 to 3.5 as was taking medication incorrectly recently. Hold Coumadin dose tonight and then return to 9 MG two days a week and 10 MG remainder.  Return in 1 week for INR check.  Return sooner to office if any bleeding or concerns.      Relevant Orders   Comprehensive metabolic panel   Lipid Panel w/o Chol/HDL Ratio   Hypertension associated with type 2 diabetes mellitus (HCC)    Chronic, ongoing with BP at goal today. Educated him on diabetes effect on vascular system.  Continue Benazepril, HCTZ, Coreg.  Recommend he monitor BP at home at least a few mornings a week + focus on DASH diet.  Discussed with him stroke prevention goal for BP <130/90.  LABS: CMP.  Continue cardiology collaboration.  Return in 3 months.      Relevant Orders   Bayer DCA Hb A1c Waived   Comprehensive metabolic panel     Endocrine   Hyperlipidemia associated with type 2 diabetes mellitus (HCC)    Chronic, ongoing.  Continue current medication regimen and adjust as needed.  Lipid panel today.      Relevant Orders   Bayer DCA Hb A1c Waived   Comprehensive metabolic panel   Lipid Panel w/o Chol/HDL Ratio   Type 2 diabetes mellitus with diabetic chronic kidney disease (HCC)    Chronic, ongoing with A1c trending down 7.5% to 7.1% today -- has not scheduled with endo as recommended.  Urine ALB 30 February 2023.  Not adherent with  diet. - Continue NPH at current dosing and Ozempic to 1 MG weekly + Farxiga continue at current dosing -- no Metformin due to CKD.  Consider increase to 2 MG Ozempic next visit, dependent on lows. - Continue Benazepril for kidney protection.   - Focus heavily on diabetic  diet and modest weight loss, decreasing carb and sugar intake at home -- have discussed at length at visits.   - Up to date foot exam, not up to date eye exam -- recommend he obtain - Would benefit from [facility] he check BS 3-4 times a day and document for next visit for review.  Will continue to work with CCM team to ensure adherence to medication regimen and diet, which is a major issue.   Discussed at length his A1C goal for stroke prevention being 6.5% or less. Return in 3 months.      Relevant Orders   Bayer DCA Hb A1c Waived   Comprehensive metabolic panel   Type 2 diabetes mellitus with peripheral neuropathy ([place])    Refer to diabetes with CKD plan of care.      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      Relevant Orders   Bayer DCA Hb A1c Waived   Comprehensive metabolic panel     Genitourinary   CKD (chronic kidney disease) stage 3, GFR 30-59 ml/min (HCC)    Chronic, ongoing with T2DM.  Continue Benazepril for kidney protection, urine ALB 30 (February 2023).  Discussed with patient dependent on labs may need to renally dose Gabapentin.  Recommend heavy focus on controlling his diabetes to prevent worsening CKD and possible need for dialysis in future.  CMP today.      Relevant Orders   Comprehensive metabolic panel     Hematopoietic and Hemostatic   Acquired thrombophilia ([place])    Monitor CBC with use of daily Coumadin for mechanical valve and a-fib.  Denies any current bleeding episodes.  Check CBC annually.        Other   Current use of long term anticoagulation    Refer to mechanical valve plan of care.      Relevant Orders   CoaguChek XS/INR Waived   History of mechanical aortic valve replacement    Chronic, ongoing with Coumadin use.  INR today 5, not at goal 2.5 to 3.5 as was taking medication  incorrectly recently. Hold Coumadin dose tonight and then return to 9 MG two days a week and 10 MG remainder.  Return in 1 week for INR check.  Return sooner to office if any bleeding or concerns.      Relevant Orders   CoaguChek XS/INR Waived   History of stroke    Discussed at length stroke prevention goals BP <130/90, A1c <6.5%, and LDL <70.  He continues to have poor diabetes control, which have discussed at length with him -- much is related to poor diet choices.      Relevant Orders   Comprehensive metabolic panel   Lipid Panel w/o Chol/HDL Ratio   Long-term insulin use (HCC)    Chronic, ongoing.  Refer to diabetes plan of care for further.      Relevant Orders   Bayer DCA Hb A1c Waived   Morbid obesity ([place])    BMI 37.66 with T2DM, HTN/HLD.  Recommended  eating smaller high protein, low fat meals more frequently and exercising 30 mins a day 5 times a week with a goal of 10-15lb weight loss in the next 3 months. Patient voiced their understanding and motivation to adhere to these recommendations.       Noncompliance w/medication treatment due to intermit use of medication    Major concern and reiterated to patient necessity of adhering to medication and diet regimen as he is at high risk for recurrent cardiac event with current co morbidities.          Follow up plan: Return in about 1 month (around 01/31/2022) for INR CHECK.

## 2021-12-31 NOTE — Assessment & Plan Note (Signed)
Chronic, ongoing with Coumadin use.  INR today 5, not at goal 2.5 to 3.5 as was taking medication incorrectly recently. Hold Coumadin dose tonight and then return to 9 MG two days a week and 10 MG remainder.  Return in 1 week for INR check.  Return sooner to office if any bleeding or concerns. 

## 2021-12-31 NOTE — Assessment & Plan Note (Signed)
BMI 37.66 with T2DM, HTN/HLD.  Recommended eating smaller high protein, low fat meals more frequently and exercising 30 mins a day 5 times a week with a goal of 10-15lb weight loss in the next 3 months. Patient voiced their understanding and motivation to adhere to these recommendations.

## 2021-12-31 NOTE — Assessment & Plan Note (Signed)
Chronic, ongoing with BP at goal today. Educated him on diabetes effect on vascular system.  Continue Benazepril, HCTZ, Coreg.  Recommend he monitor BP at home at least a few mornings a week + focus on DASH diet.  Discussed with him stroke prevention goal for BP <130/90.  LABS: CMP.  Continue cardiology collaboration.  Return in 3 months.

## 2021-12-31 NOTE — Assessment & Plan Note (Signed)
Refer to mechanical valve plan of care. 

## 2021-12-31 NOTE — Assessment & Plan Note (Signed)
Chronic, ongoing with A1c trending down 7.5% to 7.1% today -- has not scheduled with endo as recommended.  Urine ALB 30 February 2023.  Not adherent with diet. - Continue NPH at current dosing and Ozempic to 1 MG weekly + Farxiga continue at current dosing -- no Metformin due to CKD.  Consider increase to 2 MG Ozempic next visit, dependent on lows. - Continue Benazepril for kidney protection.   - Focus heavily on diabetic diet and modest weight loss, decreasing carb and sugar intake at home -- have discussed at length at visits.   - Up to date foot exam, not up to date eye exam -- recommend he obtain - Would benefit from [facility] - Recommend he check BS 3-4 times a day and document for next visit for review.  Will continue to work with CCM team to ensure adherence to medication regimen and diet, which is a major issue.   Discussed at length his A1C goal for stroke prevention being 6.5% or less. Return in 3 months.

## 2021-12-31 NOTE — Assessment & Plan Note (Signed)
Chronic, ongoing with T2DM.  Continue Benazepril for kidney protection, urine ALB 30 (February 2023).  Discussed with patient dependent on labs may need to renally dose Gabapentin.  Recommend heavy focus on controlling his diabetes to prevent worsening CKD and possible need for dialysis in future.  CMP today. 

## 2021-12-31 NOTE — Assessment & Plan Note (Signed)
Major concern and reiterated to patient necessity of adhering to medication and diet regimen as he is at high risk for recurrent cardiac event with current co morbidities.   

## 2021-12-31 NOTE — Assessment & Plan Note (Signed)
Chronic, ongoing.  Continue current medication regimen and adjust as needed. Lipid panel today. 

## 2021-12-31 NOTE — Assessment & Plan Note (Signed)
Refer to diabetes with CKD plan of care. °

## 2021-12-31 NOTE — Assessment & Plan Note (Signed)
Chronic, ongoing.  Refer to diabetes plan of care for further. 

## 2022-01-01 LAB — SPECIMEN STATUS

## 2022-01-01 NOTE — Patient Instructions (Addendum)
Coumadin 9 MG two days a week and 10 MG remainder  Vitamin K Foods and Warfarin Warfarin is a blood thinner (anticoagulant). Anticoagulant medicines help prevent blood clots from forming or getting bigger. Warfarin works by blocking the activity of vitamin K. Vitamin K promotes normal blood clotting. When you take warfarin, problems can occur from suddenly increasing or decreasing the amount of vitamin K that you eat from one day to the next. These problems can occur due to varying levels of warfarin in your blood. Problems may include blood clots or bleeding. What are tips for eating the right amount of vitamin K? Reading food labels Know which foods contain vitamin K. Read food labels. Use the lists below to understand serving sizes and the amount of vitamin K in one serving. If you take a multivitamin that contains vitamin K, be sure to take it every day. Meal planning To avoid problems when taking warfarin: Eat a balanced diet that includes: Fresh fruits and vegetables. Whole grains. Low-fat dairy products. Lean proteins, such as fish, eggs, and lean cuts of meat. Avoid major changes in your diet. If you are going to change your diet, talk with your health care provider before making changes. Keep your intake of vitamin K consistent from day to day. Avoid eating large amounts of vitamin K one day and small amounts of vitamin K the next day. Work with a dietitian to develop a meal plan that works best for you.  What foods are high in vitamin K? Foods that are high in vitamin K contain more than 100 mcg (micrograms) per serving. These include: Broccoli (cooked from fresh) -  cup (78 g) has 110 mcg. Brussels sprouts (cooked from fresh) -  cup (78 g) has 109 mcg. Greens, beet (cooked from fresh) -  cup (72 g) has 350 mcg. Greens, collard (cooked from fresh) -  cup (66 g) has 263 mcg. Greens, turnip (cooked from fresh) -  cup (72 g) has 265 mcg. Green onions or scallions -  cup (50 g)  has 105 mcg. Kale (cooked from fresh) -  cup (68 g) has 536 mcg. Parsley (raw) - 10 sprigs (10 g) has 164 mcg. Spinach (cooked from fresh) -  cup (90 g) has 444 mcg. Swiss chard (cooked from fresh) -  cup (88 g) has 287 mcg. The items listed above may not be a complete list of foods high in Vitamin K. Actual amounts of Vitamin K may differ depending on processing. Contact a dietitian for more information. What foods have a moderate amount of vitamin K? Foods that have a moderate amount of vitamin K contain 25-100 mcg per serving. These include: Asparagus (cooked from fresh) - 4 spears (60 g) have 30 mcg. Black-eyed peas (dried) -  cup (85 g) has 32 mcg. Cabbage (cooked from fresh) -  cup (78 g) has 84 mcg. Cabbage (raw) -  cup (35 g) has 26 mcg. Kiwi fruit - 1 medium (69 g) has 27 mcg. Lettuce (raw) - 1 cup (36 g) has 45 mcg. Okra (cooked from fresh) -  cup (80 g) has 32 mcg. Prunes (dried) - 5 prunes (47 g) have 25 mcg. Tuna, light, canned in oil - 3 oz (85 g) has 37 mcg. Watercress (raw) - 1 cup (34 g) has 85 mcg. The items listed above may not be a complete list of foods with a moderate amount of Vitamin K. Actual amounts of Vitamin K may differ depending on processing. Contact a dietitian for  more information. What foods are low in vitamin K? Foods low in vitamin K contain less than 25 mcg per serving. These include: Artichoke - 1 medium (128 g) has 18 mcg. Avocado - 1 oz (21 g) has 6 mcg. Blueberries -  cup (73 g) has 14 mcg. Carrots (cooked from fresh) -  cup (78 g) has 11 mcg. Cauliflower (raw) -  cup (54 g) has 8 mcg. Cucumber with peel (raw) -  cup (52 g) has 9 mcg. Grapes -  cup (76 g) has 12 mcg. Mango - 1 medium (207 g) has 9 mcg. Mixed nuts - 1 cup (142 g) has 17 mcg. Pear - 1 medium (178 g) has 8 mcg. Peas (cooked from fresh) -  cup (80 g) has 20 mcg. Pickled cucumber - 1 spear (65 g) has 11 mcg. Sauerkraut (canned) -  cup (118 g) has 16 mcg. Soybeans  (cooked from fresh) -  cup (86 g) has 16 mcg. Tomato (raw) - 1 medium (123 g) has 10 mcg. Tomato sauce (raw) -  cup (123 g) has 17 mcg. The items listed above may not be a complete list of foods low in Vitamin K. Actual amounts of Vitamin K may differ depending on processing. Contact a dietitian for more information. What foods do not have vitamin K? If a food contains less than 5 mcg per serving, it is considered to have no vitamin K. These foods include: Bread and cereal products. Cheese. Eggs. Fish and shellfish. Meat and poultry. Milk and dairy products. Seeds, such as sunflower or pumpkin seeds. The items listed above may not be a complete list of foods that do not have vitamin K. Actual amounts of vitamin K may differ depending on processing. Contact a dietitian for more information. Summary Warfarin is an anticoagulant that prevents blood clots from forming or getting bigger by blocking the activity of vitamin K. It is important to know the amount of vitamin K that is in the foods you eat and to keep your intake of vitamin K consistent from day to day. Avoid major changes in your diet. If you are going to change your diet, talk with your health care provider before making changes. This information is not intended to replace advice given to you by your health care provider. Make sure you discuss any questions you have with your health care provider. Document Revised: 07/01/2020 Document Reviewed: 07/01/2020 Elsevier Patient Education  2023 [employer].

## 2022-01-01 NOTE — Progress Notes (Signed)
Good morning Destiny, please let Jimel know his labs have returned: - Kidney function, creatinine and eGFR, remains normal, as is liver function, AST and ALT.   - Cholesterol levels are stable, with exception of triglycerides which remain elevated -- continue Fenofibrate and Atorvastatin daily + heavy focus on diet at home -- more fish and chicken. - CBC has not returned as of yet.  Any questions? Keep being stellar!!  Thank you for allowing me to participate in your care.  I appreciate you. Kindest regards, Law Corsino

## 2022-01-03 LAB — COMPREHENSIVE METABOLIC PANEL
ALT: 27 IU/L (ref 0–44)
AST: 19 IU/L (ref 0–40)
Albumin/Globulin Ratio: 1.9 (ref 1.2–2.2)
Albumin: 4.5 g/dL (ref 3.9–4.9)
Alkaline Phosphatase: 85 IU/L (ref 44–121)
BUN/Creatinine Ratio: 18 (ref 10–24)
BUN: 23 mg/dL (ref 8–27)
Bilirubin Total: 0.3 mg/dL (ref 0.0–1.2)
CO2: 24 mmol/L (ref 20–29)
Calcium: 9.5 mg/dL (ref 8.6–10.2)
Chloride: 103 mmol/L (ref 96–106)
Creatinine, Ser: 1.27 mg/dL (ref 0.76–1.27)
Globulin, Total: 2.4 g/dL (ref 1.5–4.5)
Glucose: 127 mg/dL — ABNORMAL HIGH (ref 70–99)
Potassium: 4.7 mmol/L (ref 3.5–5.2)
Sodium: 142 mmol/L (ref 134–144)
Total Protein: 6.9 g/dL (ref 6.0–8.5)
eGFR: 64 mL/min/{1.73_m2} (ref >59)

## 2022-01-03 LAB — LIPID PANEL W/O CHOL/HDL RATIO
Cholesterol, Total: 131 mg/dL (ref 100–199)
HDL: 26 mg/dL — ABNORMAL LOW (ref >39)
LDL Chol Calc (NIH): 58 mg/dL (ref 0–99)
Triglycerides: 299 mg/dL — ABNORMAL HIGH (ref 0–149)
VLDL Cholesterol Cal: 47 mg/dL — ABNORMAL HIGH (ref 5–40)

## 2022-01-07 ENCOUNTER — Ambulatory Visit (INDEPENDENT_AMBULATORY_CARE_PROVIDER_SITE_OTHER): Payer: 59 | Admitting: Nurse Practitioner

## 2022-01-07 ENCOUNTER — Encounter: Payer: Self-pay | Admitting: Nurse Practitioner

## 2022-01-07 VITALS — BP 123/80 | HR 91 | Temp 97.6°F | Wt 249.1 lb

## 2022-01-07 DIAGNOSIS — Z7901 Long term (current) use of anticoagulants: Secondary | ICD-10-CM

## 2022-01-07 DIAGNOSIS — M5136 Other intervertebral disc degeneration, lumbar region: Secondary | ICD-10-CM

## 2022-01-07 DIAGNOSIS — Z952 Presence of prosthetic heart valve: Secondary | ICD-10-CM

## 2022-01-07 LAB — COAGUCHEK XS/INR WAIVED
INR: 2.1 — ABNORMAL HIGH (ref 0.9–1.1)
Prothrombin Time: 25.4 s

## 2022-01-07 NOTE — Assessment & Plan Note (Signed)
Chronic, ongoing with Coumadin use.  INR today2.1, not at goal 2.5 to 3.5 as was taking medication incorrectly. Recommend he return to 9 MG two days a week and 10 MG remainder.  Return in 2 weeks for INR check.  Return sooner to office if any bleeding or concerns.

## 2022-01-07 NOTE — Assessment & Plan Note (Signed)
Refer to mechanical valve plan of care. 

## 2022-01-07 NOTE — Assessment & Plan Note (Addendum)
Ongoing with issues since December -- referral to ortho per patient request for further assessment.

## 2022-01-07 NOTE — Progress Notes (Signed)
BP 123/80   Pulse 91   Temp 97.6 F (36.4 C) (Oral)   Wt 249 lb 1.6 oz (113 kg)   SpO2 95%   BMI 37.88 kg/m    Subjective:    Patient ID: Rodney Plumber [name], male    DOB: 1960-08-01, 61 y.o.   MRN: [id number]  CC: Coumadin management  Would like referral to ortho for back pain ongoing with known moderate degenerative disc from imaging 04/28/21.  HPI: This patient is a 61 y.o. male who presents for coumadin management. The expected duration of coumadin treatment is lifelong The reason for anticoagulation is  mechanical heart valve.  Present Coumadin dose:  9 MG daily -- was previously on 9 MG two days a week and 10 MG remainder Goal: 2.5-3.5 The patient does not have an active anticoagulation episode. Excessive bruising: no Nose bleeding: no Rectal bleeding: no Eating diet with consistent amounts of foods containing Vitamin K:no Any recent antibiotic use? no  Relevant past medical, surgical, family and social history reviewed and updated as indicated. Interim medical history since our last visit reviewed. Allergies and medications reviewed and updated.  ROS: Per HPI unless specifically indicated above     Objective:    BP 123/80   Pulse 91   Temp 97.6 F (36.4 C) (Oral)   Wt 249 lb 1.6 oz (113 kg)   SpO2 95%   BMI 37.88 kg/m   Wt Readings from Last 3 Encounters:  01/07/22 249 lb 1.6 oz (113 kg)  12/31/21 247 lb 11.2 oz (112.4 kg)  11/26/21 246 lb 11.2 oz (111.9 kg)     General: Well appearing, well nourished in no distress.  Normal mood and affect. Skin: No excessive bruising or rash  Last INR: 5.0    Last CBC:  Lab Results  Component Value Date   WBC WILL FOLLOW 12/31/2021   HGB WILL FOLLOW 12/31/2021   HCT WILL FOLLOW 12/31/2021   MCV WILL FOLLOW 12/31/2021   PLT WILL FOLLOW 12/31/2021    Results for orders placed or performed in visit on 12/31/21  Bayer DCA Hb A1c Waived  Result Value Ref Range   HB A1C (BAYER DCA - WAIVED) 7.1 (H) 4.8 - 5.6 %   CoaguChek XS/INR Waived  Result Value Ref Range   INR 5.0 (HH) 0.9 - 1.1   Prothrombin Time 59.9 sec  Comprehensive metabolic panel  Result Value Ref Range   Glucose 127 (H) 70 - 99 mg/dL   BUN 23 8 - 27 mg/dL   Creatinine, Ser 1.27 0.76 - 1.27 mg/dL   eGFR 64 >59 mL/min/1.73   BUN/Creatinine Ratio 18 10 - 24   Sodium 142 134 - 144 mmol/L   Potassium 4.7 3.5 - 5.2 mmol/L   Chloride 103 96 - 106 mmol/L   CO2 24 20 - 29 mmol/L   Calcium 9.5 8.6 - 10.2 mg/dL   Total Protein 6.9 6.0 - 8.5 g/dL   Albumin 4.5 3.9 - 4.9 g/dL   Globulin, Total 2.4 1.5 - 4.5 g/dL   Albumin/Globulin Ratio 1.9 1.2 - 2.2   Bilirubin Total 0.3 0.0 - 1.2 mg/dL   Alkaline Phosphatase 85 44 - 121 IU/L   AST 19 0 - 40 IU/L   ALT 27 0 - 44 IU/L  Lipid Panel w/o Chol/HDL Ratio  Result Value Ref Range   Cholesterol, Total 131 100 - 199 mg/dL   Triglycerides 299 (H) 0 - 149 mg/dL   HDL 26 (L) >39 mg/dL  VLDL Cholesterol Cal 47 (H) 5 - 40 mg/dL   LDL Chol Calc (NIH) 58 0 - 99 mg/dL  Specimen Status  Result Value Ref Range   WBC WILL FOLLOW    RBC WILL FOLLOW    Hemoglobin WILL FOLLOW    Hematocrit WILL FOLLOW    MCV WILL FOLLOW    MCH WILL FOLLOW    MCHC WILL FOLLOW    RDW WILL FOLLOW    Platelets WILL FOLLOW    Neutrophils WILL FOLLOW    Lymphs WILL FOLLOW    Monocytes WILL FOLLOW    Eos WILL FOLLOW    Basos WILL FOLLOW    Neutrophils Absolute WILL FOLLOW    Lymphocytes Absolute WILL FOLLOW    Monocytes Absolute WILL FOLLOW    EOS (ABSOLUTE) WILL FOLLOW    Basophils Absolute WILL FOLLOW    Immature Granulocytes WILL FOLLOW    Immature Grans (Abs) WILL FOLLOW      Physical Exam Vitals and nursing note reviewed.  Constitutional:      General: He is not in acute distress. HENT:     Head: Normocephalic.     Right Ear: Hearing normal.     Left Ear: Hearing normal.  Eyes:     General: Lids are normal.  Neck:     Vascular: No carotid bruit.  Cardiovascular:     Rate and Rhythm: Normal rate  and regular rhythm.     Pulses: Normal pulses.     Heart sounds: Normal heart sounds.  Pulmonary:     Effort: Pulmonary effort is normal. No respiratory distress.     Breath sounds: Normal breath sounds.  Lymphadenopathy:     Cervical: No cervical adenopathy.  Neurological:     Mental Status: He is alert.  Psychiatric:        Attention and Perception: Attention normal.        Mood and Affect: Mood normal.        Speech: Speech normal.        Behavior: Behavior is cooperative.        Cognition and Memory: Cognition normal.        Judgment: Judgment normal.       Assessment:     ICD-10-CM   1. History of mechanical aortic valve replacement  Z95.2 CoaguChek XS/INR Waived    2. Current use of long term anticoagulation  Z79.01 CoaguChek XS/INR Waived    3. DDD (degenerative disc disease), lumbar  M51.36 Ambulatory referral to Orthopedics      Plan:   Discussed current plan face-to-face with patient. For coumadin dosing, to return to 9 MG two days a week and 10 MG remainder. Will plan to recheck INR in 2 weeks.

## 2022-01-16 NOTE — Patient Instructions (Addendum)
Hold Coumadin tonight -- no more Ibuprofen or Motrin!!!!!  Then tomorrow start 9 MG two days a week and 10 MG remainder.  Vitamin K Foods and Warfarin Warfarin is a blood thinner (anticoagulant). Anticoagulant medicines help prevent blood clots from forming or getting bigger. Warfarin works by blocking the activity of vitamin K. Vitamin K promotes normal blood clotting. When you take warfarin, problems can occur from suddenly increasing or decreasing the amount of vitamin K that you eat from one day to the next. These problems can occur due to varying levels of warfarin in your blood. Problems may include blood clots or bleeding. What are tips for eating the right amount of vitamin K? Reading food labels Know which foods contain vitamin K. Read food labels. Use the lists below to understand serving sizes and the amount of vitamin K in one serving. If you take a multivitamin that contains vitamin K, be sure to take it every day. Meal planning To avoid problems when taking warfarin: Eat a balanced diet that includes: Fresh fruits and vegetables. Whole grains. Low-fat dairy products. Lean proteins, such as fish, eggs, and lean cuts of meat. Avoid major changes in your diet. If you are going to change your diet, talk with your health care provider before making changes. Keep your intake of vitamin K consistent from day to day. Avoid eating large amounts of vitamin K one day and small amounts of vitamin K the next day. Work with a dietitian to develop a meal plan that works best for you.  What foods are high in vitamin K? Foods that are high in vitamin K contain more than 100 mcg (micrograms) per serving. These include: Broccoli (cooked from fresh) -  cup (78 g) has 110 mcg. Brussels sprouts (cooked from fresh) -  cup (78 g) has 109 mcg. Greens, beet (cooked from fresh) -  cup (72 g) has 350 mcg. Greens, collard (cooked from fresh) -  cup (66 g) has 263 mcg. Greens, turnip (cooked from  fresh) -  cup (72 g) has 265 mcg. Green onions or scallions -  cup (50 g) has 105 mcg. Kale (cooked from fresh) -  cup (68 g) has 536 mcg. Parsley (raw) - 10 sprigs (10 g) has 164 mcg. Spinach (cooked from fresh) -  cup (90 g) has 444 mcg. Swiss chard (cooked from fresh) -  cup (88 g) has 287 mcg. The items listed above may not be a complete list of foods high in Vitamin K. Actual amounts of Vitamin K may differ depending on processing. Contact a dietitian for more information. What foods have a moderate amount of vitamin K? Foods that have a moderate amount of vitamin K contain 25-100 mcg per serving. These include: Asparagus (cooked from fresh) - 4 spears (60 g) have 30 mcg. Black-eyed peas (dried) -  cup (85 g) has 32 mcg. Cabbage (cooked from fresh) -  cup (78 g) has 84 mcg. Cabbage (raw) -  cup (35 g) has 26 mcg. Kiwi fruit - 1 medium (69 g) has 27 mcg. Lettuce (raw) - 1 cup (36 g) has 45 mcg. Okra (cooked from fresh) -  cup (80 g) has 32 mcg. Prunes (dried) - 5 prunes (47 g) have 25 mcg. Tuna, light, canned in oil - 3 oz (85 g) has 37 mcg. Watercress (raw) - 1 cup (34 g) has 85 mcg. The items listed above may not be a complete list of foods with a moderate amount of Vitamin K. Actual amounts  of Vitamin K may differ depending on processing. Contact a dietitian for more information. What foods are low in vitamin K? Foods low in vitamin K contain less than 25 mcg per serving. These include: Artichoke - 1 medium (128 g) has 18 mcg. Avocado - 1 oz (21 g) has 6 mcg. Blueberries -  cup (73 g) has 14 mcg. Carrots (cooked from fresh) -  cup (78 g) has 11 mcg. Cauliflower (raw) -  cup (54 g) has 8 mcg. Cucumber with peel (raw) -  cup (52 g) has 9 mcg. Grapes -  cup (76 g) has 12 mcg. Mango - 1 medium (207 g) has 9 mcg. Mixed nuts - 1 cup (142 g) has 17 mcg. Pear - 1 medium (178 g) has 8 mcg. Peas (cooked from fresh) -  cup (80 g) has 20 mcg. Pickled cucumber - 1 spear (65  g) has 11 mcg. Sauerkraut (canned) -  cup (118 g) has 16 mcg. Soybeans (cooked from fresh) -  cup (86 g) has 16 mcg. Tomato (raw) - 1 medium (123 g) has 10 mcg. Tomato sauce (raw) -  cup (123 g) has 17 mcg. The items listed above may not be a complete list of foods low in Vitamin K. Actual amounts of Vitamin K may differ depending on processing. Contact a dietitian for more information. What foods do not have vitamin K? If a food contains less than 5 mcg per serving, it is considered to have no vitamin K. These foods include: Bread and cereal products. Cheese. Eggs. Fish and shellfish. Meat and poultry. Milk and dairy products. Seeds, such as sunflower or pumpkin seeds. The items listed above may not be a complete list of foods that do not have vitamin K. Actual amounts of vitamin K may differ depending on processing. Contact a dietitian for more information. Summary Warfarin is an anticoagulant that prevents blood clots from forming or getting bigger by blocking the activity of vitamin K. It is important to know the amount of vitamin K that is in the foods you eat and to keep your intake of vitamin K consistent from day to day. Avoid major changes in your diet. If you are going to change your diet, talk with your health care provider before making changes. This information is not intended to replace advice given to you by your health care provider. Make sure you discuss any questions you have with your health care provider. Document Revised: 07/01/2020 Document Reviewed: 07/01/2020 Elsevier Patient Education  2023 [employer].

## 2022-01-20 ENCOUNTER — Other Ambulatory Visit: Payer: Self-pay | Admitting: Nurse Practitioner

## 2022-01-20 DIAGNOSIS — I152 Hypertension secondary to endocrine disorders: Secondary | ICD-10-CM

## 2022-01-20 NOTE — Telephone Encounter (Signed)
Requested medication (s) are due for refill today: expired medication  Requested medication (s) are on the active medication list: yes  Last refill:  11/23/20 #180 0 refills  Future visit scheduled: yes tomorrow   Notes to clinic:  expired medication. Do you want to renew Rx?     Requested Prescriptions  Pending Prescriptions Disp Refills   carvedilol (COREG) 25 MG tablet [Pharmacy Med Name: CARVEDILOL 25 MG TABLET] 180 tablet 4    Sig: Take 1 tablet (25 mg total) by mouth 2 (two) times daily with a meal.     Cardiovascular: Beta Blockers 3 Passed - 01/20/2022  2:40 AM      Passed - Cr in normal range and within 360 days    Creatinine, Ser  Date Value Ref Range Status  12/31/2021 1.27 0.76 - 1.27 mg/dL Final         Passed - AST in normal range and within 360 days    AST  Date Value Ref Range Status  12/31/2021 19 0 - 40 IU/L Final         Passed - ALT in normal range and within 360 days    ALT  Date Value Ref Range Status  12/31/2021 27 0 - 44 IU/L Final         Passed - Last BP in normal range    BP Readings from Last 1 Encounters:  01/07/22 123/80         Passed - Last Heart Rate in normal range    Pulse Readings from Last 1 Encounters:  01/07/22 91         Passed - Valid encounter within last 6 months    Recent Outpatient Visits           1 week ago History of mechanical aortic valve replacement   [facility] [place], Jolene T, NP   2 weeks ago Type 2 diabetes mellitus with proteinuria (HCC)   [facility] [place], Covington T, NP   1 month ago History of mechanical aortic valve replacement   [facility] [place], Neihart T, NP   2 months ago History of mechanical aortic valve replacement   [facility] [place], Jolene T, NP   3 months ago Type 2 diabetes mellitus with proteinuria (HCC)   [facility] [place], [name], NP       Future Appointments             Tomorrow Marjie Skiff, NP  [facility], [facility]

## 2022-01-21 ENCOUNTER — Encounter: Payer: Self-pay | Admitting: Nurse Practitioner

## 2022-01-21 ENCOUNTER — Ambulatory Visit (INDEPENDENT_AMBULATORY_CARE_PROVIDER_SITE_OTHER): Payer: 59 | Admitting: Nurse Practitioner

## 2022-01-21 VITALS — BP 127/81 | HR 88 | Temp 97.8°F | Ht 68.0 in | Wt 246.3 lb

## 2022-01-21 DIAGNOSIS — Z952 Presence of prosthetic heart valve: Secondary | ICD-10-CM

## 2022-01-21 DIAGNOSIS — Z7901 Long term (current) use of anticoagulants: Secondary | ICD-10-CM | POA: Diagnosis not present

## 2022-01-21 LAB — COAGUCHEK XS/INR WAIVED
INR: 6.3 (ref 0.9–1.1)
Prothrombin Time: 75.3 s

## 2022-01-21 NOTE — Assessment & Plan Note (Signed)
Refer to mechanical valve plan of care. 

## 2022-01-21 NOTE — Progress Notes (Signed)
BP 127/81   Pulse 88   Temp 97.8 F (36.6 C) (Oral)   Ht 5\' 8"  (1.727 m)   Wt 246 lb 4.8 oz (111.7 kg)   SpO2 96%   BMI 37.45 kg/m    Subjective:    Patient ID: [name], male    DOB: 11/03/1960, 61 y.o.   MRN: [id number]  CC: Coumadin management  HPI: This patient is a 61 y.o. male who presents for coumadin management. The expected duration of coumadin treatment is lifelong. The reason for anticoagulation is  mechanical heart valve.  Recently had Covid (took Nyquil) and then recently had a pain in hip (took Ibuprofen).  Reports urgent care told him it was okay to take Ibuprofen.  Present Coumadin dose:  9 MG one day a week (supposed to be 2) and 10 MG remainder Goal: 2.5-3.5 The patient does not have an active anticoagulation episode. Excessive bruising: no Nose bleeding: no Rectal bleeding: no Eating diet with consistent amounts of foods containing Vitamin K:no Any recent antibiotic use? no  Relevant past medical, surgical, family and social history reviewed and updated as indicated. Interim medical history since our last visit reviewed. Allergies and medications reviewed and updated.  ROS: Per HPI unless specifically indicated above     Objective:    BP 127/81   Pulse 88   Temp 97.8 F (36.6 C) (Oral)   Ht 5\' 8"  (1.727 m)   Wt 246 lb 4.8 oz (111.7 kg)   SpO2 96%   BMI 37.45 kg/m   Wt Readings from Last 3 Encounters:  01/21/22 246 lb 4.8 oz (111.7 kg)  01/07/22 249 lb 1.6 oz (113 kg)  12/31/21 247 lb 11.2 oz (112.4 kg)     General: Well appearing, well nourished in no distress.  Normal mood and affect. Skin: No excessive bruising or rash  Last INR: 2.1    Last CBC:  Lab Results  Component Value Date   WBC WILL FOLLOW 12/31/2021   HGB WILL FOLLOW 12/31/2021   HCT WILL FOLLOW 12/31/2021   MCV WILL FOLLOW 12/31/2021   PLT WILL FOLLOW 12/31/2021    Results for orders placed or performed in visit on 01/21/22  CoaguChek XS/INR Waived  Result  Value Ref Range   INR 6.3 (HH) 0.9 - 1.1   Prothrombin Time 75.3 sec     Physical Exam Vitals and nursing note reviewed.  Constitutional:      General: He is awake. He is not in acute distress.    Appearance: He is well-groomed. He is obese. He is not ill-appearing or toxic-appearing.  HENT:     Head: Normocephalic.     Right Ear: Hearing normal.     Left Ear: Hearing normal.  Eyes:     General: Lids are normal.  Neck:     Vascular: No carotid bruit.  Cardiovascular:     Rate and Rhythm: Normal rate and regular rhythm.     Pulses: Normal pulses.     Heart sounds: Normal heart sounds.  Pulmonary:     Effort: Pulmonary effort is normal. No respiratory distress.     Breath sounds: Normal breath sounds.  Lymphadenopathy:     Cervical: No cervical adenopathy.  Neurological:     Mental Status: He is alert.  Psychiatric:        Attention and Perception: Attention normal.        Mood and Affect: Mood normal.        Speech: Speech  normal.        Behavior: Behavior is cooperative.        Cognition and Memory: Cognition normal.        Judgment: Judgment normal.     Assessment:     ICD-10-CM   1. History of mechanical aortic valve replacement  Z95.2 CoaguChek XS/INR Waived    2. Current use of long term anticoagulation  Z79.01 CoaguChek XS/INR Waived      Plan:   Discussed current plan face-to-face with patient. For coumadin dosing, to hold Coumadin this evening and then tomorrow -- then change dosing to 9 MG two days a week and 10 MG remainder. Will plan to recheck INR in 1 weeks.

## 2022-01-21 NOTE — Assessment & Plan Note (Signed)
Chronic, ongoing with Coumadin use.  INR today 6.3, above goal 2.5 to 3.5 as recent Ibuprofen use.  Recommend he not take Ibuprofen -- ever due to risk for bleeding. Recommend he hold Coumadin tonight and then start 9 MG two days a week and 10 MG remainder.  Return in 1 week for INR check.  Return sooner to office if any bleeding or concerns.

## 2022-01-23 NOTE — Patient Instructions (Addendum)
Call diabetes doctor to scheduled = [phone]  Vitamin K Foods and Warfarin Warfarin is a blood thinner (anticoagulant). Anticoagulant medicines help prevent blood clots from forming or getting bigger. Warfarin works by blocking the activity of vitamin K. Vitamin K promotes normal blood clotting. When you take warfarin, problems can occur from suddenly increasing or decreasing the amount of vitamin K that you eat from one day to the next. These problems can occur due to varying levels of warfarin in your blood. Problems may include blood clots or bleeding. What are tips for eating the right amount of vitamin K? Reading food labels Know which foods contain vitamin K. Read food labels. Use the lists below to understand serving sizes and the amount of vitamin K in one serving. If you take a multivitamin that contains vitamin K, be sure to take it every day. Meal planning To avoid problems when taking warfarin: Eat a balanced diet that includes: Fresh fruits and vegetables. Whole grains. Low-fat dairy products. Lean proteins, such as fish, eggs, and lean cuts of meat. Avoid major changes in your diet. If you are going to change your diet, talk with your health care provider before making changes. Keep your intake of vitamin K consistent from day to day. Avoid eating large amounts of vitamin K one day and small amounts of vitamin K the next day. Work with a dietitian to develop a meal plan that works best for you.  What foods are high in vitamin K? Foods that are high in vitamin K contain more than 100 mcg (micrograms) per serving. These include: Broccoli (cooked from fresh) -  cup (78 g) has 110 mcg. Brussels sprouts (cooked from fresh) -  cup (78 g) has 109 mcg. Greens, beet (cooked from fresh) -  cup (72 g) has 350 mcg. Greens, collard (cooked from fresh) -  cup (66 g) has 263 mcg. Greens, turnip (cooked from fresh) -  cup (72 g) has 265 mcg. Green onions or scallions -  cup (50  g) has 105 mcg. Kale (cooked from fresh) -  cup (68 g) has 536 mcg. Parsley (raw) - 10 sprigs (10 g) has 164 mcg. Spinach (cooked from fresh) -  cup (90 g) has 444 mcg. Swiss chard (cooked from fresh) -  cup (88 g) has 287 mcg. The items listed above may not be a complete list of foods high in Vitamin K. Actual amounts of Vitamin K may differ depending on processing. Contact a dietitian for more information. What foods have a moderate amount of vitamin K? Foods that have a moderate amount of vitamin K contain 25-100 mcg per serving. These include: Asparagus (cooked from fresh) - 4 spears (60 g) have 30 mcg. Black-eyed peas (dried) -  cup (85 g) has 32 mcg. Cabbage (cooked from fresh) -  cup (78 g) has 84 mcg. Cabbage (raw) -  cup (35 g) has 26 mcg. Kiwi fruit - 1 medium (69 g) has 27 mcg. Lettuce (raw) - 1 cup (36 g) has 45 mcg. Okra (cooked from fresh) -  cup (80 g) has 32 mcg. Prunes (dried) - 5 prunes (47 g) have 25 mcg. Tuna, light, canned in oil - 3 oz (85 g) has 37 mcg. Watercress (raw) - 1 cup (34 g) has 85 mcg. The items listed above may not be a complete list of foods with a moderate amount of Vitamin K. Actual amounts of Vitamin K may differ depending on processing. Contact a dietitian for more information. What  foods are low in vitamin K? Foods low in vitamin K contain less than 25 mcg per serving. These include: Artichoke - 1 medium (128 g) has 18 mcg. Avocado - 1 oz (21 g) has 6 mcg. Blueberries -  cup (73 g) has 14 mcg. Carrots (cooked from fresh) -  cup (78 g) has 11 mcg. Cauliflower (raw) -  cup (54 g) has 8 mcg. Cucumber with peel (raw) -  cup (52 g) has 9 mcg. Grapes -  cup (76 g) has 12 mcg. Mango - 1 medium (207 g) has 9 mcg. Mixed nuts - 1 cup (142 g) has 17 mcg. Pear - 1 medium (178 g) has 8 mcg. Peas (cooked from fresh) -  cup (80 g) has 20 mcg. Pickled cucumber - 1 spear (65 g) has 11 mcg. Sauerkraut (canned) -  cup (118 g) has 16 mcg. Soybeans  (cooked from fresh) -  cup (86 g) has 16 mcg. Tomato (raw) - 1 medium (123 g) has 10 mcg. Tomato sauce (raw) -  cup (123 g) has 17 mcg. The items listed above may not be a complete list of foods low in Vitamin K. Actual amounts of Vitamin K may differ depending on processing. Contact a dietitian for more information. What foods do not have vitamin K? If a food contains less than 5 mcg per serving, it is considered to have no vitamin K. These foods include: Bread and cereal products. Cheese. Eggs. Fish and shellfish. Meat and poultry. Milk and dairy products. Seeds, such as sunflower or pumpkin seeds. The items listed above may not be a complete list of foods that do not have vitamin K. Actual amounts of vitamin K may differ depending on processing. Contact a dietitian for more information. Summary Warfarin is an anticoagulant that prevents blood clots from forming or getting bigger by blocking the activity of vitamin K. It is important to know the amount of vitamin K that is in the foods you eat and to keep your intake of vitamin K consistent from day to day. Avoid major changes in your diet. If you are going to change your diet, talk with your health care provider before making changes. This information is not intended to replace advice given to you by your health care provider. Make sure you discuss any questions you have with your health care provider. Document Revised: 07/01/2020 Document Reviewed: 07/01/2020 Elsevier Patient Education  2023 [employer].

## 2022-01-24 DIAGNOSIS — I482 Chronic atrial fibrillation, unspecified: Secondary | ICD-10-CM | POA: Diagnosis not present

## 2022-01-24 DIAGNOSIS — E785 Hyperlipidemia, unspecified: Secondary | ICD-10-CM | POA: Diagnosis not present

## 2022-01-24 DIAGNOSIS — E1169 Type 2 diabetes mellitus with other specified complication: Secondary | ICD-10-CM | POA: Diagnosis not present

## 2022-01-24 DIAGNOSIS — E119 Type 2 diabetes mellitus without complications: Secondary | ICD-10-CM | POA: Diagnosis not present

## 2022-01-24 DIAGNOSIS — I10 Essential (primary) hypertension: Secondary | ICD-10-CM | POA: Diagnosis not present

## 2022-01-24 DIAGNOSIS — Z954 Presence of other heart-valve replacement: Secondary | ICD-10-CM | POA: Diagnosis not present

## 2022-01-24 DIAGNOSIS — Z23 Encounter for immunization: Secondary | ICD-10-CM | POA: Diagnosis not present

## 2022-01-28 ENCOUNTER — Encounter: Payer: Self-pay | Admitting: Nurse Practitioner

## 2022-01-28 ENCOUNTER — Ambulatory Visit (INDEPENDENT_AMBULATORY_CARE_PROVIDER_SITE_OTHER): Payer: 59 | Admitting: Nurse Practitioner

## 2022-01-28 VITALS — BP 134/84 | HR 90 | Temp 97.9°F | Ht 67.99 in | Wt 247.1 lb

## 2022-01-28 DIAGNOSIS — Z952 Presence of prosthetic heart valve: Secondary | ICD-10-CM

## 2022-01-28 DIAGNOSIS — Z7901 Long term (current) use of anticoagulants: Secondary | ICD-10-CM | POA: Diagnosis not present

## 2022-01-28 LAB — COAGUCHEK XS/INR WAIVED
INR: 2.5 — ABNORMAL HIGH (ref 0.9–1.1)
Prothrombin Time: 30.4 s

## 2022-01-28 NOTE — Progress Notes (Signed)
   BP 134/84   Pulse 90   Temp 97.9 F (36.6 C) (Oral)   Ht 5' 7.99" (1.727 m)   Wt 247 lb 1.6 oz (112.1 kg)   SpO2 95%   BMI 37.58 kg/m    Subjective:    Patient ID: Rodney Plumber [name], male    DOB: [birth year], 61 y.o.   MRN: [id number]  CC: Coumadin management  HPI: This patient is a 61 y.o. male who presents for coumadin management. The expected duration of coumadin treatment is lifelong. The reason for anticoagulation is  mechanical heart valve.  Saw cardiology, Dr. Saralyn Pilar, on 01/24/22.  Last echo was 03/13/2020 with LVEF greater then 55%, stable valve.   Present Coumadin dose:  9 MG two days a week and 10 MG remainder. Goal: 2.5-3.5 The patient does not have an active anticoagulation episode. Excessive bruising: no Nose bleeding: no Rectal bleeding: no Eating diet with consistent amounts of foods containing Vitamin K:no Any recent antibiotic use? no  Relevant past medical, surgical, family and social history reviewed and updated as indicated. Interim medical history since our last visit reviewed. Allergies and medications reviewed and updated.  ROS: Per HPI unless specifically indicated above     Objective:    BP 134/84   Pulse 90   Temp 97.9 F (36.6 C) (Oral)   Ht 5' 7.99" (1.727 m)   Wt 247 lb 1.6 oz (112.1 kg)   SpO2 95%   BMI 37.58 kg/m   Wt Readings from Last 3 Encounters:  01/28/22 247 lb 1.6 oz (112.1 kg)  01/21/22 246 lb 4.8 oz (111.7 kg)  01/07/22 249 lb 1.6 oz (113 kg)     General: Well appearing, well nourished in no distress.  Normal mood and affect. Skin: No excessive bruising or rash  Last INR: 6.3  Last CBC:  Lab Results  Component Value Date   WBC WILL FOLLOW 12/31/2021   HGB WILL FOLLOW 12/31/2021   HCT WILL FOLLOW 12/31/2021   MCV WILL FOLLOW 12/31/2021   PLT WILL FOLLOW 12/31/2021    Results for orders placed or performed in visit on 01/28/22  CoaguChek XS/INR Waived  Result Value Ref Range   INR 2.5 (H) 0.9 - 1.1    Prothrombin Time 30.4 sec    Assessment:     ICD-10-CM   1. History of mechanical aortic valve replacement  Z95.2 CoaguChek XS/INR Waived    2. Current use of long term anticoagulation  Z79.01 CoaguChek XS/INR Waived      Plan:   Discussed current plan face-to-face with patient. For coumadin dosing, to continue 9 MG two days a week and 10 MG remainder.  Will plan to recheck INR in 4 weeks.

## 2022-01-28 NOTE — Assessment & Plan Note (Addendum)
Refer to mechanical valve plan of care. 

## 2022-01-28 NOTE — Assessment & Plan Note (Signed)
Chronic, ongoing with Coumadin use.  INR today 2.5, at goal 2.5 to 3.5 -- improved. Elected to continue 9 MG two days a week and 10 MG remainder.  Return in 4 weeks for INR check.  Return sooner to office if any bleeding or concerns.

## 2022-01-31 DIAGNOSIS — M5416 Radiculopathy, lumbar region: Secondary | ICD-10-CM | POA: Diagnosis not present

## 2022-02-20 NOTE — Patient Instructions (Addendum)
Decrease Coumadin to 9 MG three days a week and 10 MG remainder of days.  Return in 2 weeks for INR recheck.  Vitamin K Foods and Warfarin Warfarin is a blood thinner (anticoagulant). Anticoagulant medicines help prevent blood clots from forming or getting bigger. Warfarin works by blocking the activity of vitamin K. Vitamin K promotes normal blood clotting. When you take warfarin, problems can occur from suddenly increasing or decreasing the amount of vitamin K that you eat from one day to the next. These problems can occur due to varying levels of warfarin in your blood. Problems may include blood clots or bleeding. What are tips for eating the right amount of vitamin K? Reading food labels Know which foods contain vitamin K. Read food labels. Use the lists below to understand serving sizes and the amount of vitamin K in one serving. If you take a multivitamin that contains vitamin K, be sure to take it every day. Meal planning To avoid problems when taking warfarin: Eat a balanced diet that includes: Fresh fruits and vegetables. Whole grains. Low-fat dairy products. Lean proteins, such as fish, eggs, and lean cuts of meat. Avoid major changes in your diet. If you are going to change your diet, talk with your health care provider before making changes. Keep your intake of vitamin K consistent from day to day. Avoid eating large amounts of vitamin K one day and small amounts of vitamin K the next day. Work with a dietitian to develop a meal plan that works best for you.  What foods are high in vitamin K? Foods that are high in vitamin K contain more than 100 mcg (micrograms) per serving. These include: Broccoli (cooked from fresh) -  cup (78 g) has 110 mcg. Brussels sprouts (cooked from fresh) -  cup (78 g) has 109 mcg. Greens, beet (cooked from fresh) -  cup (72 g) has 350 mcg. Greens, collard (cooked from fresh) -  cup (66 g) has 263 mcg. Greens, turnip (cooked from fresh) -  cup  (72 g) has 265 mcg. Green onions or scallions -  cup (50 g) has 105 mcg. Kale (cooked from fresh) -  cup (68 g) has 536 mcg. Parsley (raw) - 10 sprigs (10 g) has 164 mcg. Spinach (cooked from fresh) -  cup (90 g) has 444 mcg. Swiss chard (cooked from fresh) -  cup (88 g) has 287 mcg. The items listed above may not be a complete list of foods high in Vitamin K. Actual amounts of Vitamin K may differ depending on processing. Contact a dietitian for more information. What foods have a moderate amount of vitamin K? Foods that have a moderate amount of vitamin K contain 25-100 mcg per serving. These include: Asparagus (cooked from fresh) - 4 spears (60 g) have 30 mcg. Black-eyed peas (dried) -  cup (85 g) has 32 mcg. Cabbage (cooked from fresh) -  cup (78 g) has 84 mcg. Cabbage (raw) -  cup (35 g) has 26 mcg. Kiwi fruit - 1 medium (69 g) has 27 mcg. Lettuce (raw) - 1 cup (36 g) has 45 mcg. Okra (cooked from fresh) -  cup (80 g) has 32 mcg. Prunes (dried) - 5 prunes (47 g) have 25 mcg. Tuna, light, canned in oil - 3 oz (85 g) has 37 mcg. Watercress (raw) - 1 cup (34 g) has 85 mcg. The items listed above may not be a complete list of foods with a moderate amount of Vitamin K. Actual amounts  of Vitamin K may differ depending on processing. Contact a dietitian for more information. What foods are low in vitamin K? Foods low in vitamin K contain less than 25 mcg per serving. These include: Artichoke - 1 medium (128 g) has 18 mcg. Avocado - 1 oz (21 g) has 6 mcg. Blueberries -  cup (73 g) has 14 mcg. Carrots (cooked from fresh) -  cup (78 g) has 11 mcg. Cauliflower (raw) -  cup (54 g) has 8 mcg. Cucumber with peel (raw) -  cup (52 g) has 9 mcg. Grapes -  cup (76 g) has 12 mcg. Mango - 1 medium (207 g) has 9 mcg. Mixed nuts - 1 cup (142 g) has 17 mcg. Pear - 1 medium (178 g) has 8 mcg. Peas (cooked from fresh) -  cup (80 g) has 20 mcg. Pickled cucumber - 1 spear (65 g) has 11  mcg. Sauerkraut (canned) -  cup (118 g) has 16 mcg. Soybeans (cooked from fresh) -  cup (86 g) has 16 mcg. Tomato (raw) - 1 medium (123 g) has 10 mcg. Tomato sauce (raw) -  cup (123 g) has 17 mcg. The items listed above may not be a complete list of foods low in Vitamin K. Actual amounts of Vitamin K may differ depending on processing. Contact a dietitian for more information. What foods do not have vitamin K? If a food contains less than 5 mcg per serving, it is considered to have no vitamin K. These foods include: Bread and cereal products. Cheese. Eggs. Fish and shellfish. Meat and poultry. Milk and dairy products. Seeds, such as sunflower or pumpkin seeds. The items listed above may not be a complete list of foods that do not have vitamin K. Actual amounts of vitamin K may differ depending on processing. Contact a dietitian for more information. Summary Warfarin is an anticoagulant that prevents blood clots from forming or getting bigger by blocking the activity of vitamin K. It is important to know the amount of vitamin K that is in the foods you eat and to keep your intake of vitamin K consistent from day to day. Avoid major changes in your diet. If you are going to change your diet, talk with your health care provider before making changes. This information is not intended to replace advice given to you by your health care provider. Make sure you discuss any questions you have with your health care provider. Document Revised: 07/01/2020 Document Reviewed: 07/01/2020 Elsevier Patient Education  [employer].

## 2022-02-25 ENCOUNTER — Ambulatory Visit (INDEPENDENT_AMBULATORY_CARE_PROVIDER_SITE_OTHER): Payer: 59 | Admitting: Nurse Practitioner

## 2022-02-25 ENCOUNTER — Encounter: Payer: Self-pay | Admitting: Nurse Practitioner

## 2022-02-25 VITALS — BP 138/72 | HR 94 | Temp 98.2°F | Wt 250.2 lb

## 2022-02-25 DIAGNOSIS — Z952 Presence of prosthetic heart valve: Secondary | ICD-10-CM | POA: Diagnosis not present

## 2022-02-25 DIAGNOSIS — Z7901 Long term (current) use of anticoagulants: Secondary | ICD-10-CM

## 2022-02-25 LAB — COAGUCHEK XS/INR WAIVED
INR: 4.2 — ABNORMAL HIGH (ref 0.9–1.1)
Prothrombin Time: 50.9 s

## 2022-02-25 MED ORDER — FENOFIBRATE 145 MG PO TABS
145.0000 mg | ORAL_TABLET | Freq: Every day | ORAL | 4 refills | Status: DC
Start: 2022-02-25 — End: 2022-03-15

## 2022-02-25 NOTE — Assessment & Plan Note (Signed)
Chronic, ongoing with Coumadin use.  INR today 4.2, above goal 2.5 to 3.5 -- increased. Elected to change to 9 MG three days a week and 10 MG remainder.  Return in 2 weeks for INR check.  Return sooner to office if any bleeding or concerns.

## 2022-02-25 NOTE — Assessment & Plan Note (Signed)
Refer to mechanical valve plan of care. 

## 2022-02-25 NOTE — Progress Notes (Signed)
   BP 138/72 (BP Location: Left Arm, Patient Position: Sitting)   Pulse 94   Temp 98.2 F (36.8 C) (Oral)   Wt 250 lb 3.2 oz (113.5 kg)   SpO2 92%   BMI 38.05 kg/m    Subjective:    Patient ID: Rodney Plumber [name], male    DOB: 1960/09/07, 61 y.o.   MRN: [id number]  CC: Coumadin management  HPI: This patient is a 61 y.o. male who presents for coumadin management. The expected duration of coumadin treatment is lifelong. The reason for anticoagulation is  mechanical heart valve. Follows with cardiology, Dr. Saralyn Pilar, last on 01/24/22.  Last echo was 03/13/2020 with LVEF greater then 55%, stable valve.   Present Coumadin dose:  9 MG two days a week and 10 MG remainder. Goal: 2.5-3.5 The patient does not have an active anticoagulation episode. Excessive bruising: no Nose bleeding: no Rectal bleeding: no Eating diet with consistent amounts of foods containing Vitamin K:no Any recent antibiotic use? no  Relevant past medical, surgical, family and social history reviewed and updated as indicated. Interim medical history since our last visit reviewed. Allergies and medications reviewed and updated.  ROS: Per HPI unless specifically indicated above     Objective:    BP 138/72 (BP Location: Left Arm, Patient Position: Sitting)   Pulse 94   Temp 98.2 F (36.8 C) (Oral)   Wt 250 lb 3.2 oz (113.5 kg)   SpO2 92%   BMI 38.05 kg/m   Wt Readings from Last 3 Encounters:  02/25/22 250 lb 3.2 oz (113.5 kg)  01/28/22 247 lb 1.6 oz (112.1 kg)  01/21/22 246 lb 4.8 oz (111.7 kg)     General: Well appearing, well nourished in no distress.  Normal mood and affect. Skin: No excessive bruising or rash  Last INR: 2.5  Last CBC:  Lab Results  Component Value Date   WBC WILL FOLLOW 12/31/2021   HGB WILL FOLLOW 12/31/2021   HCT WILL FOLLOW 12/31/2021   MCV WILL FOLLOW 12/31/2021   PLT WILL FOLLOW 12/31/2021    Results for orders placed or performed in visit on 02/25/22  CoaguChek XS/INR  Waived  Result Value Ref Range   INR 4.2 (H) 0.9 - 1.1   Prothrombin Time 50.9 sec    Assessment:     ICD-10-CM   1. History of mechanical aortic valve replacement  Z95.2 CoaguChek XS/INR Waived    2. Current use of long term anticoagulation  Z79.01 CoaguChek XS/INR Waived      Plan:   Discussed current plan face-to-face with patient. For coumadin dosing, change to 9 MG three days a week and 10 MG remainder.  Will plan to recheck INR in 2 weeks.

## 2022-03-06 ENCOUNTER — Other Ambulatory Visit: Payer: Self-pay | Admitting: Nurse Practitioner

## 2022-03-06 DIAGNOSIS — E1129 Type 2 diabetes mellitus with other diabetic kidney complication: Secondary | ICD-10-CM

## 2022-03-06 DIAGNOSIS — N1831 Chronic kidney disease, stage 3a: Secondary | ICD-10-CM

## 2022-03-06 DIAGNOSIS — E1142 Type 2 diabetes mellitus with diabetic polyneuropathy: Secondary | ICD-10-CM

## 2022-03-06 DIAGNOSIS — E1122 Type 2 diabetes mellitus with diabetic chronic kidney disease: Secondary | ICD-10-CM

## 2022-03-07 ENCOUNTER — Other Ambulatory Visit: Payer: Self-pay | Admitting: Nurse Practitioner

## 2022-03-07 MED ORDER — TRULICITY 1.5 MG/0.5ML ~~LOC~~ SOAJ: 1.5000 mg | SUBCUTANEOUS | 4 refills | Status: DC

## 2022-03-07 NOTE — Patient Instructions (Incomplete)
Vitamin K Foods and Warfarin Warfarin is a blood thinner (anticoagulant). Anticoagulant medicines help prevent blood clots from forming or getting bigger. Warfarin works by blocking the activity of vitamin K. Vitamin K promotes normal blood clotting. When you take warfarin, problems can occur from suddenly increasing or decreasing the amount of vitamin K that you eat from one day to the next. These problems can occur due to varying levels of warfarin in your blood. Problems may include blood clots or bleeding. What are tips for eating the right amount of vitamin K? Reading food labels Know which foods contain vitamin K. Read food labels. Use the lists below to understand serving sizes and the amount of vitamin K in one serving. If you take a multivitamin that contains vitamin K, be sure to take it every day. Meal planning To avoid problems when taking warfarin: Eat a balanced diet that includes: Fresh fruits and vegetables. Whole grains. Low-fat dairy products. Lean proteins, such as fish, eggs, and lean cuts of meat. Avoid major changes in your diet. If you are going to change your diet, talk with your health care provider before making changes. Keep your intake of vitamin K consistent from day to day. Avoid eating large amounts of vitamin K one day and small amounts of vitamin K the next day. Work with a dietitian to develop a meal plan that works best for you.  What foods are high in vitamin K? Foods that are high in vitamin K contain more than 100 mcg (micrograms) per serving. These include: Broccoli (cooked from fresh) -  cup (78 g) has 110 mcg. Brussels sprouts (cooked from fresh) -  cup (78 g) has 109 mcg. Greens, beet (cooked from fresh) -  cup (72 g) has 350 mcg. Greens, collard (cooked from fresh) -  cup (66 g) has 263 mcg. Greens, turnip (cooked from fresh) -  cup (72 g) has 265 mcg. Green onions or scallions -  cup (50 g) has 105 mcg. Kale (cooked from fresh) -  cup (68  g) has 536 mcg. Parsley (raw) - 10 sprigs (10 g) has 164 mcg. Spinach (cooked from fresh) -  cup (90 g) has 444 mcg. Swiss chard (cooked from fresh) -  cup (88 g) has 287 mcg. The items listed above may not be a complete list of foods high in Vitamin K. Actual amounts of Vitamin K may differ depending on processing. Contact a dietitian for more information. What foods have a moderate amount of vitamin K? Foods that have a moderate amount of vitamin K contain 25-100 mcg per serving. These include: Asparagus (cooked from fresh) - 4 spears (60 g) have 30 mcg. Black-eyed peas (dried) -  cup (85 g) has 32 mcg. Cabbage (cooked from fresh) -  cup (78 g) has 84 mcg. Cabbage (raw) -  cup (35 g) has 26 mcg. Kiwi fruit - 1 medium (69 g) has 27 mcg. Lettuce (raw) - 1 cup (36 g) has 45 mcg. Okra (cooked from fresh) -  cup (80 g) has 32 mcg. Prunes (dried) - 5 prunes (47 g) have 25 mcg. Tuna, light, canned in oil - 3 oz (85 g) has 37 mcg. Watercress (raw) - 1 cup (34 g) has 85 mcg. The items listed above may not be a complete list of foods with a moderate amount of Vitamin K. Actual amounts of Vitamin K may differ depending on processing. Contact a dietitian for more information. What foods are low in vitamin K? Foods low in   vitamin K contain less than 25 mcg per serving. These include: Artichoke - 1 medium (128 g) has 18 mcg. Avocado - 1 oz (21 g) has 6 mcg. Blueberries -  cup (73 g) has 14 mcg. Carrots (cooked from fresh) -  cup (78 g) has 11 mcg. Cauliflower (raw) -  cup (54 g) has 8 mcg. Cucumber with peel (raw) -  cup (52 g) has 9 mcg. Grapes -  cup (76 g) has 12 mcg. Mango - 1 medium (207 g) has 9 mcg. Mixed nuts - 1 cup (142 g) has 17 mcg. Pear - 1 medium (178 g) has 8 mcg. Peas (cooked from fresh) -  cup (80 g) has 20 mcg. Pickled cucumber - 1 spear (65 g) has 11 mcg. Sauerkraut (canned) -  cup (118 g) has 16 mcg. Soybeans (cooked from fresh) -  cup (86 g) has 16 mcg. Tomato  (raw) - 1 medium (123 g) has 10 mcg. Tomato sauce (raw) -  cup (123 g) has 17 mcg. The items listed above may not be a complete list of foods low in Vitamin K. Actual amounts of Vitamin K may differ depending on processing. Contact a dietitian for more information. What foods do not have vitamin K? If a food contains less than 5 mcg per serving, it is considered to have no vitamin K. These foods include: Bread and cereal products. Cheese. Eggs. Fish and shellfish. Meat and poultry. Milk and dairy products. Seeds, such as sunflower or pumpkin seeds. The items listed above may not be a complete list of foods that do not have vitamin K. Actual amounts of vitamin K may differ depending on processing. Contact a dietitian for more information. Summary Warfarin is an anticoagulant that prevents blood clots from forming or getting bigger by blocking the activity of vitamin K. It is important to know the amount of vitamin K that is in the foods you eat and to keep your intake of vitamin K consistent from day to day. Avoid major changes in your diet. If you are going to change your diet, talk with your health care provider before making changes. This information is not intended to replace advice given to you by your health care provider. Make sure you discuss any questions you have with your health care provider. Document Revised: 07/01/2020 Document Reviewed: 07/01/2020 Elsevier Patient Education  2023 [employer] Inc[employer]  

## 2022-03-07 NOTE — Telephone Encounter (Signed)
Requested medications are due for refill today.  unsure  Requested medications are on the active medications list.  unsure  Last refill.   Future visit scheduled.   yes  Notes to clinic.  Pharmacy comment: Alternative Requested:NOT COVERED ANYMORE.    Requested Prescriptions  Pending Prescriptions Disp Refills   TRULICITY 5.45 GY/5.6LS SOPN [Pharmacy Med Name: TRULICITY 9.37 DS/2.8 ML PEN]  0     Endocrinology:  Diabetes - GLP-1 Receptor Agonists Passed - 03/06/2022 10:07 AM      Passed - HBA1C is between 0 and 7.9 and within 180 days    Hemoglobin A1C  Date Value Ref Range Status  12/16/2015 10.4  Final   HB A1C (BAYER DCA - WAIVED)  Date Value Ref Range Status  12/31/2021 7.1 (H) 4.8 - 5.6 % Final    Comment:             Prediabetes: 5.7 - 6.4          Diabetes: >6.4          Glycemic control for adults with diabetes: <7.0          Passed - Valid encounter within last 6 months    Recent Outpatient Visits           1 week ago History of mechanical aortic valve replacement   [facility] [place], Henrine Screws T, NP   1 month ago History of mechanical aortic valve replacement   [place], Henrine Screws T, NP   1 month ago History of mechanical aortic valve replacement   [place], Redford T, NP   1 month ago History of mechanical aortic valve replacement   [facility] [place], Jolene T, NP   2 months ago 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

## 2022-03-09 ENCOUNTER — Ambulatory Visit: Payer: 59 | Admitting: Nurse Practitioner

## 2022-03-09 DIAGNOSIS — Z952 Presence of prosthetic heart valve: Secondary | ICD-10-CM

## 2022-03-09 DIAGNOSIS — Z7901 Long term (current) use of anticoagulants: Secondary | ICD-10-CM

## 2022-03-13 NOTE — Patient Instructions (Incomplete)
Warfarin Life Changes You will learn about lifestyle changes and precautions required when taking warfarin.  To view the content, go to this web address: [email or web address]  This video will expire on: 01/12/2024. If you need access to this video following this date, please reach out to the healthcare provider who assigned it to you. This information is not intended to replace advice given to you by your health care provider. Make sure you discuss any questions you have with your health care provider. Elsevier Patient Education  2023 [employer] Inc.  

## 2022-03-15 ENCOUNTER — Ambulatory Visit (INDEPENDENT_AMBULATORY_CARE_PROVIDER_SITE_OTHER): Payer: 59 | Admitting: Nurse Practitioner

## 2022-03-15 ENCOUNTER — Encounter: Payer: Self-pay | Admitting: Nurse Practitioner

## 2022-03-15 VITALS — BP 120/80 | HR 87 | Temp 97.8°F | Wt 250.6 lb

## 2022-03-15 DIAGNOSIS — Z7901 Long term (current) use of anticoagulants: Secondary | ICD-10-CM

## 2022-03-15 DIAGNOSIS — Z952 Presence of prosthetic heart valve: Secondary | ICD-10-CM | POA: Diagnosis not present

## 2022-03-15 LAB — COAGUCHEK XS/INR WAIVED
INR: 6 (ref 0.9–1.1)
Prothrombin Time: 72.2 s

## 2022-03-15 NOTE — Assessment & Plan Note (Signed)
Chronic, ongoing with Coumadin use.  INR today 6.2, above goal 2.5 to 3.5. Stop Fenofibrate, as recently restarted and now INR elevated, risk for this on review. Elected to change to 9 MG five days a week and 10 MG remainder - hold tonight and tomorrow.  Return in 1 week for INR check.  Return sooner to office if any bleeding or concerns.

## 2022-03-15 NOTE — Progress Notes (Signed)
   BP 120/80 (BP Location: Left Arm, Cuff Size: Normal)   Pulse 87   Temp 97.8 F (36.6 C) (Oral)   Wt 250 lb 9.6 oz (113.7 kg)   SpO2 91%   BMI 38.11 kg/m    Subjective:    Patient ID: Rodney Plumber [name], male    DOB: [birth year], 61 y.o.   MRN: [id number]  CC: Coumadin management  HPI: This patient is a 61 y.o. male who presents for coumadin management. The expected duration of coumadin treatment is lifelong. The reason for anticoagulation is  mechanical heart valve. Follows with cardiology, Dr. Saralyn Pilar, last on 01/24/22.  Last echo was 03/13/2020 with LVEF greater then 55%, stable valve.  Did recently restart Fenofibrate.  No recent abx or steroid injections.  Present Coumadin dose:  9 MG three days a week and 10 MG remainder Goal: 2.5-3.5 The patient does not have an active anticoagulation episode. Excessive bruising: no Nose bleeding: no Rectal bleeding: no Eating diet with consistent amounts of foods containing Vitamin K:no Any recent antibiotic use? no  Relevant past medical, surgical, family and social history reviewed and updated as indicated. Interim medical history since our last visit reviewed. Allergies and medications reviewed and updated.  ROS: Per HPI unless specifically indicated above     Objective:    BP 120/80 (BP Location: Left Arm, Cuff Size: Normal)   Pulse 87   Temp 97.8 F (36.6 C) (Oral)   Wt 250 lb 9.6 oz (113.7 kg)   SpO2 91%   BMI 38.11 kg/m   Wt Readings from Last 3 Encounters:  03/15/22 250 lb 9.6 oz (113.7 kg)  02/25/22 250 lb 3.2 oz (113.5 kg)  01/28/22 247 lb 1.6 oz (112.1 kg)    General: Well appearing, well nourished in no distress.  Normal mood and affect. Skin: No excessive bruising or rash  Last INR: 4.2  Last CBC:  Lab Results  Component Value Date   WBC WILL FOLLOW 12/31/2021   HGB WILL FOLLOW 12/31/2021   HCT WILL FOLLOW 12/31/2021   MCV WILL FOLLOW 12/31/2021   PLT WILL FOLLOW 12/31/2021    Results for orders placed  or performed in visit on 03/15/22  CoaguChek XS/INR Waived  Result Value Ref Range   INR 6.0 (HH) 0.9 - 1.1   Prothrombin Time 72.2 sec    Assessment:     ICD-10-CM   1. History of mechanical aortic valve replacement  Z95.2 CoaguChek XS/INR Waived    2. Current use of long term anticoagulation  Z79.01 CoaguChek XS/INR Waived      Plan:   Discussed current plan face-to-face with patient. For coumadin dosing, change to 9 MG five days a week and 10 MG two days a week.  Will plan to recheck INR in 1 week.

## 2022-03-15 NOTE — Assessment & Plan Note (Signed)
Refer to mechanical valve plan of care. 

## 2022-03-20 NOTE — Patient Instructions (Signed)
Vitamin K Foods and Warfarin Warfarin is a blood thinner (anticoagulant). Anticoagulant medicines help prevent blood clots from forming or getting bigger. Warfarin works by blocking the activity of vitamin K. Vitamin K promotes normal blood clotting. When you take warfarin, problems can occur from suddenly increasing or decreasing the amount of vitamin K that you eat from one day to the next. These problems can occur due to varying levels of warfarin in your blood. Problems may include blood clots or bleeding. What are tips for eating the right amount of vitamin K? Reading food labels Know which foods contain vitamin K. Read food labels. Use the lists below to understand serving sizes and the amount of vitamin K in one serving. If you take a multivitamin that contains vitamin K, be sure to take it every day. Meal planning To avoid problems when taking warfarin: Eat a balanced diet that includes: Fresh fruits and vegetables. Whole grains. Low-fat dairy products. Lean proteins, such as fish, eggs, and lean cuts of meat. Avoid major changes in your diet. If you are going to change your diet, talk with your health care provider before making changes. Keep your intake of vitamin K consistent from day to day. Avoid eating large amounts of vitamin K one day and small amounts of vitamin K the next day. Work with a dietitian to develop a meal plan that works best for you.  What foods are high in vitamin K? Foods that are high in vitamin K contain more than 100 mcg (micrograms) per serving. These include: Broccoli (cooked from fresh) -  cup (78 g) has 110 mcg. Brussels sprouts (cooked from fresh) -  cup (78 g) has 109 mcg. Greens, beet (cooked from fresh) -  cup (72 g) has 350 mcg. Greens, collard (cooked from fresh) -  cup (66 g) has 263 mcg. Greens, turnip (cooked from fresh) -  cup (72 g) has 265 mcg. Green onions or scallions -  cup (50 g) has 105 mcg. Kale (cooked from fresh) -  cup (68  g) has 536 mcg. Parsley (raw) - 10 sprigs (10 g) has 164 mcg. Spinach (cooked from fresh) -  cup (90 g) has 444 mcg. Swiss chard (cooked from fresh) -  cup (88 g) has 287 mcg. The items listed above may not be a complete list of foods high in Vitamin K. Actual amounts of Vitamin K may differ depending on processing. Contact a dietitian for more information. What foods have a moderate amount of vitamin K? Foods that have a moderate amount of vitamin K contain 25-100 mcg per serving. These include: Asparagus (cooked from fresh) - 4 spears (60 g) have 30 mcg. Black-eyed peas (dried) -  cup (85 g) has 32 mcg. Cabbage (cooked from fresh) -  cup (78 g) has 84 mcg. Cabbage (raw) -  cup (35 g) has 26 mcg. Kiwi fruit - 1 medium (69 g) has 27 mcg. Lettuce (raw) - 1 cup (36 g) has 45 mcg. Okra (cooked from fresh) -  cup (80 g) has 32 mcg. Prunes (dried) - 5 prunes (47 g) have 25 mcg. Tuna, light, canned in oil - 3 oz (85 g) has 37 mcg. Watercress (raw) - 1 cup (34 g) has 85 mcg. The items listed above may not be a complete list of foods with a moderate amount of Vitamin K. Actual amounts of Vitamin K may differ depending on processing. Contact a dietitian for more information. What foods are low in vitamin K? Foods low in   vitamin K contain less than 25 mcg per serving. These include: Artichoke - 1 medium (128 g) has 18 mcg. Avocado - 1 oz (21 g) has 6 mcg. Blueberries -  cup (73 g) has 14 mcg. Carrots (cooked from fresh) -  cup (78 g) has 11 mcg. Cauliflower (raw) -  cup (54 g) has 8 mcg. Cucumber with peel (raw) -  cup (52 g) has 9 mcg. Grapes -  cup (76 g) has 12 mcg. Mango - 1 medium (207 g) has 9 mcg. Mixed nuts - 1 cup (142 g) has 17 mcg. Pear - 1 medium (178 g) has 8 mcg. Peas (cooked from fresh) -  cup (80 g) has 20 mcg. Pickled cucumber - 1 spear (65 g) has 11 mcg. Sauerkraut (canned) -  cup (118 g) has 16 mcg. Soybeans (cooked from fresh) -  cup (86 g) has 16 mcg. Tomato  (raw) - 1 medium (123 g) has 10 mcg. Tomato sauce (raw) -  cup (123 g) has 17 mcg. The items listed above may not be a complete list of foods low in Vitamin K. Actual amounts of Vitamin K may differ depending on processing. Contact a dietitian for more information. What foods do not have vitamin K? If a food contains less than 5 mcg per serving, it is considered to have no vitamin K. These foods include: Bread and cereal products. Cheese. Eggs. Fish and shellfish. Meat and poultry. Milk and dairy products. Seeds, such as sunflower or pumpkin seeds. The items listed above may not be a complete list of foods that do not have vitamin K. Actual amounts of vitamin K may differ depending on processing. Contact a dietitian for more information. Summary Warfarin is an anticoagulant that prevents blood clots from forming or getting bigger by blocking the activity of vitamin K. It is important to know the amount of vitamin K that is in the foods you eat and to keep your intake of vitamin K consistent from day to day. Avoid major changes in your diet. If you are going to change your diet, talk with your health care provider before making changes. This information is not intended to replace advice given to you by your health care provider. Make sure you discuss any questions you have with your health care provider. Document Revised: 07/01/2020 Document Reviewed: 07/01/2020 Elsevier Patient Education  2023 Elsevier Inc.  

## 2022-03-22 ENCOUNTER — Ambulatory Visit (INDEPENDENT_AMBULATORY_CARE_PROVIDER_SITE_OTHER): Payer: 59 | Admitting: Nurse Practitioner

## 2022-03-22 ENCOUNTER — Encounter: Payer: Self-pay | Admitting: Nurse Practitioner

## 2022-03-22 VITALS — BP 136/85 | HR 80 | Temp 97.9°F | Wt 251.5 lb

## 2022-03-22 DIAGNOSIS — Z7901 Long term (current) use of anticoagulants: Secondary | ICD-10-CM | POA: Diagnosis not present

## 2022-03-22 DIAGNOSIS — Z952 Presence of prosthetic heart valve: Secondary | ICD-10-CM | POA: Diagnosis not present

## 2022-03-22 LAB — COAGUCHEK XS/INR WAIVED
INR: 3.2 — ABNORMAL HIGH (ref 0.9–1.1)
Prothrombin Time: 38.8 s

## 2022-03-22 NOTE — Progress Notes (Signed)
   BP 136/85   Pulse 80   Temp 97.9 F (36.6 C) (Oral)   Wt 251 lb 8 oz (114.1 kg)   SpO2 95%   BMI 38.25 kg/m    Subjective:    Patient ID: Rodney Chary [name], male    DOB: July 09, 1960, 61 y.o.   MRN: [id number]  CC: Coumadin management  HPI: This patient is a 61 y.o. male who presents for coumadin management. The expected duration of coumadin treatment is lifelong. The reason for anticoagulation is  mechanical heart valve. Follows with cardiology, Dr. Darrold Junker, last on 01/24/22.  Last echo was 03/13/2020 with LVEF greater then 55%, stable valve.  He has stopped Fenofibrate due to elevation in INR with this.  Present Coumadin dose:  9 MG daily at this time Goal: 2.5-3.5 The patient does not have an active anticoagulation episode. Excessive bruising: no Nose bleeding: no Rectal bleeding: no Eating diet with consistent amounts of foods containing Vitamin K:no Any recent antibiotic use? no  Relevant past medical, surgical, family and social history reviewed and updated as indicated. Interim medical history since our last visit reviewed. Allergies and medications reviewed and updated.  ROS: Per HPI unless specifically indicated above     Objective:    BP 136/85   Pulse 80   Temp 97.9 F (36.6 C) (Oral)   Wt 251 lb 8 oz (114.1 kg)   SpO2 95%   BMI 38.25 kg/m   Wt Readings from Last 3 Encounters:  03/22/22 251 lb 8 oz (114.1 kg)  03/15/22 250 lb 9.6 oz (113.7 kg)  02/25/22 250 lb 3.2 oz (113.5 kg)    General: Well appearing, well nourished in no distress.  Normal mood and affect. Skin: No excessive bruising or rash  Last INR: 6.0  Last CBC:  Lab Results  Component Value Date   WBC WILL FOLLOW 12/31/2021   HGB WILL FOLLOW 12/31/2021   HCT WILL FOLLOW 12/31/2021   MCV WILL FOLLOW 12/31/2021   PLT WILL FOLLOW 12/31/2021    Results for orders placed or performed in visit on 03/22/22  CoaguChek XS/INR Waived  Result Value Ref Range   INR 3.2 (H) 0.9 - 1.1    Prothrombin Time 38.8 sec    Assessment:     ICD-10-CM   1. History of mechanical aortic valve replacement  Z95.2 CoaguChek XS/INR Waived    2. Current use of long term anticoagulation  Z79.01 CoaguChek XS/INR Waived      Plan:   Discussed current plan face-to-face with patient. For coumadin dosing, continue 9 MG daily.  Will plan to recheck INR in 4 week.

## 2022-03-22 NOTE — Assessment & Plan Note (Signed)
Refer to mechanical valve plan of care. 

## 2022-03-22 NOTE — Assessment & Plan Note (Signed)
Chronic, ongoing with Coumadin use.  INR today 3.2, in goal 2.5 to 3.5. Remain off Fenofibrate.  Elected to continue 9 MG daily of Coumadin.  Return in 4 weeks for INR check.  Return sooner to office if any bleeding or concerns.

## 2022-03-23 ENCOUNTER — Other Ambulatory Visit: Payer: Self-pay | Admitting: Nurse Practitioner

## 2022-03-23 DIAGNOSIS — E119 Type 2 diabetes mellitus without complications: Secondary | ICD-10-CM

## 2022-03-23 NOTE — Telephone Encounter (Signed)
Requested Prescriptions  Pending Prescriptions Disp Refills   FARXIGA 10 MG TABS tablet [Pharmacy Med Name: FARXIGA 10 MG TABLET] 90 tablet 0    Sig: TAKE 1 TABLET BY MOUTH EVERY DAY     Endocrinology:  Diabetes - SGLT2 Inhibitors Passed - 03/23/2022  2:03 AM      Passed - Cr in normal range and within 360 days    Creatinine, Ser  Date Value Ref Range Status  12/31/2021 1.27 0.76 - 1.27 mg/dL Final         Passed - HBA1C is between 0 and 7.9 and within 180 days    Hemoglobin A1C  Date Value Ref Range Status  12/16/2015 10.4  Final   HB A1C (BAYER DCA - WAIVED)  Date Value Ref Range Status  12/31/2021 7.1 (H) 4.8 - 5.6 % Final    Comment:             Prediabetes: 5.7 - 6.4          Diabetes: >6.4          Glycemic control for adults with diabetes: <7.0          Passed - eGFR in normal range and within 360 days    GFR calc Af Amer  Date Value Ref Range Status  06/17/2020 77 >59 mL/min/1.73 Final    Comment:    **In accordance with recommendations from the NKF-ASN Task force,**   [facility] is in the process of updating its eGFR calculation to the   2021 CKD-EPI creatinine equation that estimates kidney function   without a race variable.    GFR calc non Af Amer  Date Value Ref Range Status  06/17/2020 67 >59 mL/min/1.73 Final   eGFR  Date Value Ref Range Status  12/31/2021 64 >59 mL/min/1.73 Final         Passed - Valid encounter within last 6 months    Recent Outpatient Visits           Yesterday History of mechanical aortic valve replacement   [facility] [place], Henrine Screws T, NP   1 week ago History of mechanical aortic valve replacement   [facility] [place], Jolene T, NP   3 weeks ago History of mechanical aortic valve replacement   [place], Henrine Screws T, NP   1 month ago History of mechanical aortic valve replacement   [facility] [place], [place] T, NP   2 months ago History of mechanical aortic  valve replacement   [place], Barbaraann Faster, NP       Future Appointments             In 3 weeks Cannady, Barbaraann Faster, NP [employer], [facility]

## 2022-03-24 ENCOUNTER — Telehealth: Payer: Self-pay

## 2022-03-24 ENCOUNTER — Other Ambulatory Visit: Payer: Self-pay | Admitting: Nurse Practitioner

## 2022-03-24 DIAGNOSIS — E1159 Type 2 diabetes mellitus with other circulatory complications: Secondary | ICD-10-CM

## 2022-03-24 NOTE — Telephone Encounter (Signed)
Requested medication (s) are due for refill today - expired Rx  Requested medication (s) are on the active medication list -yes  Future visit scheduled -yes  Last refill: 03/22/21 #90 4RF  Notes to clinic: expired RF  Requested Prescriptions  Pending Prescriptions Disp Refills   hydrochlorothiazide (HYDRODIURIL) 25 MG tablet [Pharmacy Med Name: HYDROCHLOROTHIAZIDE 25 MG TAB] 90 tablet 4    Sig: Take 1 tablet (25 mg total) by mouth daily.     Cardiovascular: Diuretics - Thiazide Passed - 03/24/2022  2:07 AM      Passed - Cr in normal range and within 180 days    Creatinine, Ser  Date Value Ref Range Status  12/31/2021 1.27 0.76 - 1.27 mg/dL Final         Passed - K in normal range and within 180 days    Potassium  Date Value Ref Range Status  12/31/2021 4.7 3.5 - 5.2 mmol/L Final         Passed - Na in normal range and within 180 days    Sodium  Date Value Ref Range Status  12/31/2021 142 134 - 144 mmol/L Final         Passed - Last BP in normal range    BP Readings from Last 1 Encounters:  03/22/22 136/85         Passed - Valid encounter within last 6 months    Recent Outpatient Visits           2 days ago History of mechanical aortic valve replacement   [facility] [place], Corrie Dandy T, NP   1 week ago History of mechanical aortic valve replacement   [facility] [place], Jolene T, NP   3 weeks ago History of mechanical aortic valve replacement   [facility] [place], [place] T, NP   1 month ago History of mechanical aortic valve replacement   [facility] [place], [place] T, NP   2 months ago History of mechanical aortic valve replacement   [facility] [place], Corrie Dandy T, NP       Future Appointments             In 3 weeks Cannady, [name], NP [employer], [facility]               Requested Prescriptions  Pending Prescriptions Disp Refills   hydrochlorothiazide (HYDRODIURIL) 25 MG  tablet [Pharmacy Med Name: HYDROCHLOROTHIAZIDE 25 MG TAB] 90 tablet 4    Sig: Take 1 tablet (25 mg total) by mouth daily.     Cardiovascular: Diuretics - Thiazide Passed - 03/24/2022  2:07 AM      Passed - Cr in normal range and within 180 days    Creatinine, Ser  Date Value Ref Range Status  12/31/2021 1.27 0.76 - 1.27 mg/dL Final         Passed - K in normal range and within 180 days    Potassium  Date Value Ref Range Status  12/31/2021 4.7 3.5 - 5.2 mmol/L Final         Passed - Na in normal range and within 180 days    Sodium  Date Value Ref Range Status  12/31/2021 142 134 - 144 mmol/L Final         Passed - Last BP in normal range    BP Readings from Last 1 Encounters:  03/22/22 136/85         Passed - Valid encounter within last 6 months  Recent Outpatient Visits           2 days ago History of mechanical aortic valve replacement   [facility] [place], [place] T, NP   1 week ago History of mechanical aortic valve replacement   [facility] [place], [place] T, NP   3 weeks ago History of mechanical aortic valve replacement   [facility] [place], Corrie Dandy T, NP   1 month ago History of mechanical aortic valve replacement   [facility] [place], Corrie Dandy T, NP   2 months ago History of mechanical aortic valve replacement   [facility] [place], [name], NP       Future Appointments             In 3 weeks Cannady, [name], NP [employer], [facility]

## 2022-03-24 NOTE — Telephone Encounter (Signed)
PA for Farxiga initiated and submitted via Cover My Meds. Key: [id number]

## 2022-03-29 ENCOUNTER — Other Ambulatory Visit: Payer: Self-pay | Admitting: Nurse Practitioner

## 2022-03-29 NOTE — Telephone Encounter (Signed)
Requested Prescriptions  Pending Prescriptions Disp Refills   tamsulosin (FLOMAX) 0.4 MG CAPS capsule [Pharmacy Med Name: TAMSULOSIN HCL 0.4 MG CAPSULE] 90 capsule 1    Sig: TAKE 1 CAPSULE BY MOUTH EVERY DAY AFTER SUPPER     Urology: Alpha-Adrenergic Blocker Failed - 03/29/2022  2:02 AM      Failed - PSA in normal range and within 360 days    Prostate Specific Ag, Serum  Date Value Ref Range Status  02/17/2021 0.4 0.0 - 4.0 ng/mL Final    Comment:    Roche ECLIA methodology. According to the American Urological Association, Serum PSA should decrease and remain at undetectable levels after radical prostatectomy. The AUA defines biochemical recurrence as an initial PSA value 0.2 ng/mL or greater followed by a subsequent confirmatory PSA value 0.2 ng/mL or greater. Values obtained with different assay methods or kits cannot be used interchangeably. Results cannot be interpreted as absolute evidence of the presence or absence of malignant disease.          Passed - Last BP in normal range    BP Readings from Last 1 Encounters:  03/22/22 136/85         Passed - Valid encounter within last 12 months    Recent Outpatient Visits           1 week ago History of mechanical aortic valve replacement   [facility] [place], Corrie Dandy T, NP   2 weeks ago History of mechanical aortic valve replacement   [facility] [place], Corrie Dandy T, NP   1 month ago History of mechanical aortic valve replacement   [facility] [place], [place] T, NP   2 months ago History of mechanical aortic valve replacement   [facility] [place], [place] T, NP   2 months ago History of mechanical aortic valve replacement   [facility] [place], [name], NP       Future Appointments             In 3 weeks Cannady, [name], NP [employer], [facility]

## 2022-04-05 ENCOUNTER — Ambulatory Visit: Payer: Self-pay | Admitting: *Deleted

## 2022-04-05 NOTE — Telephone Encounter (Signed)
Summary: dizziness   Patient's wife called in says patient has dizziness going on of and on.      Chief Complaint: Dizziness Symptoms: "Spinning, bad yesterday, not today."  Occurs with "Moving or turning fast."  Does not check BP at home, no new meds. Has had vertigo in past. Frequency: Yesterday Pertinent Negatives: Patient denies weakness, headache, no new  meds. Disposition: [] ED /[] Urgent Care (no appt availability in office) / [] Appointment(In office/virtual)/ []  [facility]/ [] Home Care/ [] Refused Recommended Disposition /[] [place] [facility]/ [x]  Follow-up with PCP Additional Notes: Pt wished to see Jolene only. States "She will usually see me at 4:20 after work."  Assured pt NT would route to practice for PCPs review and final disposition.  Care advise provided per protocol. Advised ED for worsening symptoms. Please advise. Reason for Disposition  [1] MODERATE dizziness (e.g., vertigo; feels very unsteady, interferes with normal activities) AND [2] has been evaluated by doctor (or NP/PA) for this  Answer Assessment - Initial Assessment Questions 1. DESCRIPTION: "Describe your dizziness."     Spinning 2. VERTIGO: "Do you feel like either you or the room is spinning or tilting?"      Yes 3. LIGHTHEADED: "Do you feel lightheaded?" (e.g., somewhat faint, woozy, weak upon standing)     Spinning 4. SEVERITY: "How bad is it?"  "Can you walk?"   - MILD: Feels slightly dizzy and unsteady, but is walking normally.   - MODERATE: Feels unsteady when walking, but not falling; interferes with normal activities (e.g., school, work).   - SEVERE: Unable to walk without falling, or requires assistance to walk without falling.     Better today, bad yesterday 5. ONSET:  "When did the dizziness begin?"     Yesterday 6. AGGRAVATING FACTORS: "Does anything make it worse?" (e.g., standing, change in head position)     "Move head fast, move fast." 7. CAUSE: "What do you think is  causing the dizziness?"     Unsure 8. RECURRENT SYMPTOM: "Have you had dizziness before?" If Yes, ask: "When was the last time?" "What happened that time?"     Yes, "From meds" 9. OTHER SYMPTOMS: "Do you have any other symptoms?" (e.g., headache, weakness, numbness, vomiting, earache)     No  Protocols used: Dizziness - Vertigo-A-AH

## 2022-04-11 ENCOUNTER — Emergency Department
Admission: EM | Admit: 2022-04-11 | Discharge: 2022-04-11 | Disposition: A | Discharge status: Home / Self Care | Payer: 59 | Attending: Emergency Medicine | Admitting: Emergency Medicine

## 2022-04-11 ENCOUNTER — Encounter: Payer: Self-pay | Admitting: Emergency Medicine

## 2022-04-11 ENCOUNTER — Other Ambulatory Visit: Payer: Self-pay

## 2022-04-11 DIAGNOSIS — R791 Abnormal coagulation profile: Secondary | ICD-10-CM | POA: Insufficient documentation

## 2022-04-11 DIAGNOSIS — Z8673 Personal history of transient ischemic attack (TIA), and cerebral infarction without residual deficits: Secondary | ICD-10-CM | POA: Diagnosis not present

## 2022-04-11 DIAGNOSIS — I4891 Unspecified atrial fibrillation: Secondary | ICD-10-CM | POA: Insufficient documentation

## 2022-04-11 DIAGNOSIS — N189 Chronic kidney disease, unspecified: Secondary | ICD-10-CM | POA: Insufficient documentation

## 2022-04-11 DIAGNOSIS — R42 Dizziness and giddiness: Secondary | ICD-10-CM

## 2022-04-11 DIAGNOSIS — I129 Hypertensive chronic kidney disease with stage 1 through stage 4 chronic kidney disease, or unspecified chronic kidney disease: Secondary | ICD-10-CM | POA: Diagnosis not present

## 2022-04-11 DIAGNOSIS — Z952 Presence of prosthetic heart valve: Secondary | ICD-10-CM | POA: Insufficient documentation

## 2022-04-11 DIAGNOSIS — Z7901 Long term (current) use of anticoagulants: Secondary | ICD-10-CM | POA: Insufficient documentation

## 2022-04-11 LAB — CBC
HCT: 50 % (ref 39.0–52.0)
Hemoglobin: 16.1 g/dL (ref 13.0–17.0)
MCH: 28 pg (ref 26.0–34.0)
MCHC: 32.2 g/dL (ref 30.0–36.0)
MCV: 87.1 fL (ref 80.0–100.0)
Platelets: 191 10*3/uL (ref 150–400)
RBC: 5.74 MIL/uL (ref 4.22–5.81)
RDW: 12.9 % (ref 11.5–15.5)
WBC: 9.9 10*3/uL (ref 4.0–10.5)
nRBC: 0 % (ref 0.0–0.2)

## 2022-04-11 LAB — BASIC METABOLIC PANEL
Anion gap: 8 (ref 5–15)
BUN: 29 mg/dL — ABNORMAL HIGH (ref 8–23)
CO2: 26 mmol/L (ref 22–32)
Calcium: 9.4 mg/dL (ref 8.9–10.3)
Chloride: 102 mmol/L (ref 98–111)
Creatinine, Ser: 1.15 mg/dL (ref 0.61–1.24)
GFR, Estimated: >60 mL/min (ref >60)
Glucose, Bld: 193 mg/dL — ABNORMAL HIGH (ref 70–99)
Potassium: 4.2 mmol/L (ref 3.5–5.1)
Sodium: 136 mmol/L (ref 135–145)

## 2022-04-11 LAB — PROTIME-INR
INR: 2 — ABNORMAL HIGH (ref 0.8–1.2)
Prothrombin Time: 22.5 seconds — ABNORMAL HIGH (ref 11.4–15.2)

## 2022-04-11 LAB — TROPONIN I (HIGH SENSITIVITY)
Troponin I (High Sensitivity): 4 ng/L (ref <18)
Troponin I (High Sensitivity): 4 ng/L (ref <18)

## 2022-04-11 NOTE — ED Triage Notes (Signed)
Patient reports intermittent dizziness for the past week, states it is worse when he stands up suddenly. Denies chest pain or shortness of breath. Denies LOC. Denies nausea. AOX4. Speaking in full clear sentences. Denies weakness.

## 2022-04-11 NOTE — ED Provider Notes (Signed)
[facility] Provider Note    Event Date/Time   First MD Initiated Contact with Patient 04/11/22 0848     (approximate)   History   Chief Complaint Dizziness   HPI  Rodney IGNASIAK [name] is a 61 y.o. male with a past medical history of hypertension, hyperlipidemia, diabetes, atrial fibrillation, stroke, CKD, and mechanical aortic valve on Coumadin who presents to the ED complaining of dizziness.  Patient reports that he has been feeling dizzy and lightheaded when rising to a standing position intermittently for the past 1 to 2 weeks.  He denies any associated chest pain or shortness of breath, has not had any vision changes, speech changes, numbness, or weakness.  He has otherwise felt well with no fevers, cough, nausea, vomiting, or diarrhea.  He reports being compliant with his Coumadin.  He does admit to drinking mostly soda with minimal water intake.     Physical Exam   Triage Vital Signs: ED Triage Vitals  Enc Vitals Group     BP 04/11/22 0641 132/87     Pulse Rate 04/11/22 0641 84     Resp 04/11/22 0641 18     Temp 04/11/22 0641 97.9 F (36.6 C)     Temp Source 04/11/22 0641 Oral     SpO2 04/11/22 0641 93 %     Weight 04/11/22 0647 250 lb (113.4 kg)     Height 04/11/22 0647 5\' 8"  (1.727 m)     Head Circumference --      Peak Flow --      Pain Score 04/11/22 0645 0     Pain Loc --      Pain Edu? --      Excl. in GC? --     Most recent vital signs: Vitals:   04/11/22 0641 04/11/22 0927  BP: 132/87 116/75  Pulse: 84 80  Resp: 18 16  Temp: 97.9 F (36.6 C)   SpO2: 93% 94%    Constitutional: Alert and oriented. Eyes: Conjunctivae are normal. Head: Atraumatic. Nose: No congestion/rhinnorhea. Mouth/Throat: Mucous membranes are moist.  Cardiovascular: Normal rate, regular rhythm. Grossly normal heart sounds.  2+ radial pulses bilaterally. Respiratory: Normal respiratory effort.  No retractions. Lungs CTAB. Gastrointestinal: Soft and  nontender. No distention. Musculoskeletal: No lower extremity tenderness nor edema.  Neurologic:  Normal speech and language. No gross focal neurologic deficits are appreciated.    ED Results / Procedures / Treatments   Labs (all labs ordered are listed, but only abnormal results are displayed) Labs Reviewed  BASIC METABOLIC PANEL - Abnormal; Notable for the following components:      Result Value   Glucose, Bld 193 (*)    BUN 29 (*)    All other components within normal limits  PROTIME-INR - Abnormal; Notable for the following components:   Prothrombin Time 22.5 (*)    INR 2.0 (*)    All other components within normal limits  CBC  URINALYSIS, ROUTINE W REFLEX MICROSCOPIC  CBG MONITORING, ED  TROPONIN I (HIGH SENSITIVITY)  TROPONIN I (HIGH SENSITIVITY)     EKG  ED ECG REPORT I, 14/04/23, the attending physician, personally viewed and interpreted this ECG.   Date: 04/11/2022  EKG Time: 6:36  Rate: 84  Rhythm: normal sinus rhythm  Axis: Normal  Intervals:none  ST&T Change: None  PROCEDURES:  Critical Care performed: No  Procedures   MEDICATIONS ORDERED IN ED: Medications - No data to display   IMPRESSION / MDM / ASSESSMENT  AND PLAN / ED COURSE  I reviewed the triage vital signs and the nursing notes.                              61 y.o. male with past medical history of hypertension, hyperlipidemia, diabetes, atrial fibrillation, stroke, CKD, and aortic valve replacement on Coumadin who presents to the ED complaining of intermittent dizziness for the past 1 to 2 weeks, described as lightheadedness especially when standing.  Patient's presentation is most consistent with acute presentation with potential threat to life or bodily function.  Differential diagnosis includes, but is not limited to, arrhythmia, ACS, dehydration, electrode abnormality, AKI, anemia, stroke, TIA.  Patient nontoxic-appearing and in no acute distress, vital signs are  unremarkable.  EKG shows no evidence of arrhythmia or ischemia and initial troponin negative, low suspicion for cardiac etiology for his symptoms.  He has no focal neurologic deficits on exam and described symptoms do not sound concerning for stroke.  Additional labs are reassuring with no significant anemia, leukocytosis, fat abnormality, or AKI.  He does have elevated BUN to creatinine ratio potentially concerning for dehydration, especially given his poor water intake.  Will observe on cardiac monitor, check INR as well as repeat troponin.  If this is unremarkable, patient would be appropriate for discharge home with PCP or cardiology follow-up.  INR slightly subtherapeutic at 2.0, repeat troponin within normal limits.  Low suspicion for cardiac etiology for his symptoms, no events noted on cardiac monitor.  Patient does admit that he reduced his dose of Coumadin yesterday because he was concerned that high INR could be causing his symptoms.  He was counseled to resume his usual dose and to schedule follow-up for recheck of his INR later this week.  He is otherwise appropriate for discharge home with PCP follow-up, was counseled to return to the ED for new or worsening symptoms.  Patient agrees with plan.      FINAL CLINICAL IMPRESSION(S) / ED DIAGNOSES   Final diagnoses:  Dizziness  Lightheadedness     Rx / DC Orders   ED Discharge Orders     None        Note:  This document was prepared using Dragon voice recognition software and may include unintentional dictation errors.   Chesley Noon, MD 04/11/22 [phone]

## 2022-04-13 ENCOUNTER — Telehealth: Payer: Self-pay | Admitting: *Deleted

## 2022-04-13 NOTE — Patient Outreach (Addendum)
  Care Coordination [facility] Note Transition Care Management Follow-up Telephone Call Date of discharge and from where: [facility] [phone] How have you been since you were released from the hospital? I am doing good Any questions or concerns? No  Items Reviewed: Did the pt receive and understand the discharge instructions provided? Yes  Medications obtained and verified? Yes  Other? No  Any new allergies since your discharge? No  Dietary orders reviewed? No Do you have support at home? Yes   Home Care and Equipment/Supplies: Were home health services ordered? not applicable If so, what is the name of the agency? N/a  Has the agency set up a time to come to the patient's home? not applicable Were any new equipment or medical supplies ordered?  No What is the name of the medical supply agency? N/a Were you able to get the supplies/equipment? not applicable Do you have any questions related to the use of the equipment or supplies? No  Functional Questionnaire: (I = Independent and D = Dependent) ADLs: I  Bathing/Dressing- I  Meal Prep- I  Eating- I  Maintaining continence- I  Transferring/Ambulation- I  Managing Meds- I  Follow up appointments reviewed:  PCP Hospital f/u appt confirmed? Yes  Scheduled to see Aura Dials [phone] 4:20 PM . Specialist Hospital f/u appt confirmed? No  . Are transportation arrangements needed? No  If their condition worsens, is the pt aware to call PCP or go to the Emergency Dept.? Yes Was the patient provided with contact information for the PCP's office or ED? Yes Was to pt encouraged to call back with questions or concerns? Yes  SDOH assessments and interventions completed:   Yes  SDOH Screenings   Food Insecurity: No Food Insecurity (04/13/2022)  Housing: Low Risk  (04/13/2022)  Transportation Needs: Unknown (04/13/2022)  Depression (PHQ2-9): Low Risk  (09/24/2021)  Physical Activity: Inactive (12/03/2018)  Tobacco Use: Low Risk  (04/11/2022)     Care Coordination Interventions:  No Care Coordination interventions needed at this time.   Encounter Outcome:  Pt. Visit Completed    Gean Maidens BSN RN [facility] [phone]

## 2022-04-17 NOTE — Patient Instructions (Signed)
Vertigo Vertigo is the feeling that you or the things around you are moving when they are not. This feeling can come and go at any time. Vertigo often goes away on its own. This condition can be dangerous if it happens when you are doing activities like driving or working with machines. Your doctor will do tests to find the cause of your vertigo. These tests will also help your doctor decide on the best treatment for you. Follow these instructions at home: Eating and drinking     Drink enough fluid to keep your pee (urine) pale yellow. Do not drink alcohol. Activity Return to your normal activities when your doctor says that it is safe. In the morning, first sit up on the side of the bed. When you feel okay, stand slowly while you hold onto something until you know that your balance is fine. Move slowly. Avoid sudden body or head movements or certain positions, as told by your doctor. Use a cane if you have trouble standing or walking. Sit down right away if you feel dizzy. Avoid doing any tasks or activities that can cause danger to you or others if you get dizzy. Avoid bending down if you feel dizzy. Place items in your home so that they are easy for you to reach without bending or leaning over. Do not drive or use machinery if you feel dizzy. General instructions Take over-the-counter and prescription medicines only as told by your doctor. Keep all follow-up visits. Contact a doctor if: Your medicine does not help your vertigo. Your problems get worse or you have new symptoms. You have a fever. You feel like you may vomit (nauseous), or this feeling gets worse. You start to vomit. Your family or friends see changes in how you act. You lose feeling (have numbness) in part of your body. You feel prickling and tingling in a part of your body. Get help right away if: You are always dizzy. You faint. You get very bad headaches. You get a stiff neck. Bright light starts to bother  you. You have trouble moving or talking. You feel weak in your hands, arms, or legs. You have changes in your hearing or in how you see (vision). These symptoms may be an emergency. Get help right away. Call your local emergency services (911 in the [place] Do not wait to see if the symptoms will go away. Do not drive yourself to the hospital. Summary Vertigo is the feeling that you or the things around you are moving when they are not. Your doctor will do tests to find the cause of your vertigo. You may be told to avoid some tasks, positions, or movements. Contact a doctor if your medicine is not helping, or if you have a fever, new symptoms, or a change in how you act. Get help right away if you get very bad headaches, or if you have changes in how you speak, hear, or see. This information is not intended to replace advice given to you by your health care provider. Make sure you discuss any questions you have with your health care provider. Document Revised: 03/25/2020 Document Reviewed: 03/25/2020 Elsevier Patient Education  2023 [employer]  

## 2022-04-19 ENCOUNTER — Encounter: Payer: Self-pay | Admitting: Nurse Practitioner

## 2022-04-19 ENCOUNTER — Ambulatory Visit (INDEPENDENT_AMBULATORY_CARE_PROVIDER_SITE_OTHER): Payer: 59 | Admitting: Nurse Practitioner

## 2022-04-19 VITALS — BP 142/86 | HR 80 | Temp 97.9°F | Ht 67.99 in | Wt 255.9 lb

## 2022-04-19 DIAGNOSIS — E1159 Type 2 diabetes mellitus with other circulatory complications: Secondary | ICD-10-CM

## 2022-04-19 DIAGNOSIS — Z952 Presence of prosthetic heart valve: Secondary | ICD-10-CM | POA: Diagnosis not present

## 2022-04-19 DIAGNOSIS — I482 Chronic atrial fibrillation, unspecified: Secondary | ICD-10-CM | POA: Diagnosis not present

## 2022-04-19 DIAGNOSIS — Z794 Long term (current) use of insulin: Secondary | ICD-10-CM

## 2022-04-19 DIAGNOSIS — R809 Proteinuria, unspecified: Secondary | ICD-10-CM

## 2022-04-19 DIAGNOSIS — D6869 Other thrombophilia: Secondary | ICD-10-CM

## 2022-04-19 DIAGNOSIS — Z7901 Long term (current) use of anticoagulants: Secondary | ICD-10-CM | POA: Diagnosis not present

## 2022-04-19 DIAGNOSIS — N1831 Chronic kidney disease, stage 3a: Secondary | ICD-10-CM

## 2022-04-19 DIAGNOSIS — E1169 Type 2 diabetes mellitus with other specified complication: Secondary | ICD-10-CM | POA: Diagnosis not present

## 2022-04-19 DIAGNOSIS — E1122 Type 2 diabetes mellitus with diabetic chronic kidney disease: Secondary | ICD-10-CM

## 2022-04-19 DIAGNOSIS — Z8673 Personal history of transient ischemic attack (TIA), and cerebral infarction without residual deficits: Secondary | ICD-10-CM

## 2022-04-19 DIAGNOSIS — E1142 Type 2 diabetes mellitus with diabetic polyneuropathy: Secondary | ICD-10-CM | POA: Diagnosis not present

## 2022-04-19 DIAGNOSIS — N400 Enlarged prostate without lower urinary tract symptoms: Secondary | ICD-10-CM

## 2022-04-19 DIAGNOSIS — I152 Hypertension secondary to endocrine disorders: Secondary | ICD-10-CM | POA: Diagnosis not present

## 2022-04-19 DIAGNOSIS — E785 Hyperlipidemia, unspecified: Secondary | ICD-10-CM

## 2022-04-19 DIAGNOSIS — E1129 Type 2 diabetes mellitus with other diabetic kidney complication: Secondary | ICD-10-CM

## 2022-04-19 DIAGNOSIS — Z91148 Patient's other noncompliance with medication regimen for other reason: Secondary | ICD-10-CM

## 2022-04-19 DIAGNOSIS — R42 Dizziness and giddiness: Secondary | ICD-10-CM

## 2022-04-19 MED ORDER — AMLODIPINE BESYLATE 5 MG PO TABS: 5.0000 mg | ORAL_TABLET | Freq: Every day | ORAL | 4 refills | Status: DC

## 2022-04-19 MED ORDER — SEMAGLUTIDE (1 MG/DOSE) 4 MG/3ML ~~LOC~~ SOPN: 1.0000 mg | PEN_INJECTOR | SUBCUTANEOUS | 4 refills | Status: DC

## 2022-04-19 MED ORDER — MECLIZINE HCL 12.5 MG PO TABS: 12.5000 mg | ORAL_TABLET | Freq: Three times a day (TID) | ORAL | 5 refills | Status: DC | PRN

## 2022-04-19 NOTE — Assessment & Plan Note (Signed)
Monitor CBC with use of daily Coumadin for mechanical valve and a-fib.  Denies any current bleeding episodes.  Check CBC annually.

## 2022-04-19 NOTE — Assessment & Plan Note (Signed)
BMI 38.92 with T2DM, HTN/HLD.  Recommended eating smaller high protein, low fat meals more frequently and exercising 30 mins a day 5 times a week with a goal of 10-15lb weight loss in the next 3 months. Patient voiced their understanding and motivation to adhere to these recommendations.

## 2022-04-19 NOTE — Assessment & Plan Note (Signed)
Ongoing since CVA.  Recent CT scan remains stable, suspect much of dizziness is based on poor diabetes control, however recommend he return to neurology as they recommended (he missed 2-3 month follow-up), provided # for him to schedule.  Continue collaboration with neurology + Meclizine as needed.

## 2022-04-19 NOTE — Progress Notes (Signed)
BP (!) 142/86 (BP Location: Left Arm, Patient Position: Sitting, Cuff Size: Large)   Pulse 80   Temp 97.9 F (36.6 C) (Oral)   Ht 5' 7.99" (1.727 m)   Wt 255 lb 14.4 oz (116.1 kg)   SpO2 94%   BMI 38.92 kg/m    Subjective:    Patient ID: Rodney Chary [name], male    DOB: Jan 07, 1961, 61 y.o.   MRN: [id number]  HPI: Rodney Dixon. is a 61 y.o. male  Chief Complaint  Patient presents with   INR check   Diabetes   Dizziness   DIABETES Last A1c 7.1% in August.  Endocrinology visit last Dr. Everardo All 01/26/21 and continues on current medications: Farxiga, NPH, Trulicity 1.5 MG weekly (changed from Ozempic due to shortage, but he prefers to [employer] and would like to return to this).  Has not returned to endo, missed December visit.  Have discussed with him at all recent visits to reschedule, which he has not.  On visit 08/27/21 placed referral to Dr. Patrecia Pace locally, as patient reported it would be easier to have provider here and [facility] discharged him.  Has not scheduled as of yet.  He has endorsed being an "eater" + enjoying sweets.   Continues on Gabapentin -- taking 900 MG in the morning, 900 MG at lunch, and then 1200 MG before bed.   Hypoglycemic episodes: no Polydipsia/polyuria: no Visual disturbance: no Chest pain: no Paresthesias: no Glucose Monitoring: yes  Accucheck frequency: occasional  Fasting glucose:   Post prandial:  Evening:  Before meals: Taking Insulin?: yes  Long acting insulin:  Short acting insulin: Blood Pressure Monitoring: not checking Retinal Examination: Not up to Date Foot Exam: Up to Date Pneumovax: Up to Date Influenza: Up to Date Aspirin: no   HYPERTENSION / HYPERLIPIDEMIA Saw cardiology last 01/24/22 -- they continue to recommend sleep study and he has not obtained.  He is to continue Benazepril, HCTZ and Carvedilol. Continues on Atorvastatin, stopped Fenofibrate due to interaction with Coumadin. Last echo noted normal LV function.   Satisfied with current treatment? yes Duration of hypertension: chronic BP monitoring frequency: not checking BP range:  BP medication side effects: no Duration of hyperlipidemia: chronic Cholesterol medication side effects: no Cholesterol supplements: none Medication compliance: good compliance Aspirin: no Recent stressors: no Recurrent headaches: no Visual changes: no Palpitations: no Dyspnea: no Chest pain: no Lower extremity edema: no Dizzy/lightheaded: yes  CHRONIC KIDNEY DISEASE Stable on recent labs. CKD status: stable Medications renally dose: yes Previous renal evaluation: no Pneumovax:  Up to Date Influenza Vaccine:  Up to Date   ATRIAL FIBRILLATION Currently takes 9 MG daily.  INR today 3.4. 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  No recent eye exam.  Saw neurology on 03/30/21 for history of stroke.  Has dizziness ongoing on occasion -- recent CT remains reassuring on 05/12/21 and has not returned to neurology as recommended.  Recent episode of dizziness one week ago, was seen in ER 04/11/22 with reassuring labs.   Duration: months Description of symptoms: lightheaded Duration of episode: minutes Dizziness frequency: recurrent Provoking factors:  unknown Aggravating factors:   unknown Triggered by  rolling over in bed: no Triggered by bending over: yes Aggravated by head movement: yes Aggravated by exertion, coughing, loud noises: no Recent head injury: no Recent or current viral symptoms: no History of vasovagal episodes: no Nausea: no Vomiting: no Tinnitus: no Hearing loss: no Aural  fullness: no Headache: no Photophobia/phonophobia: no Unsteady gait: yes Postural instability:  occasional Diplopia, dysarthria, dysphagia or weakness: no Related to exertion: no Pallor: no Diaphoresis: no Dyspnea: no Chest pain: no   Relevant past medical, surgical, family and social history reviewed and updated as indicated. Interim medical history since our last visit reviewed. Allergies and medications reviewed and updated.  Review of Systems  Constitutional:  Negative for activity change, diaphoresis, fatigue and fever.  Respiratory:  Negative for cough, chest tightness, shortness of breath and wheezing.   Cardiovascular:  Negative for chest pain, palpitations and leg swelling.  Gastrointestinal: Negative.   Endocrine: Negative for polydipsia, polyphagia and polyuria.  Genitourinary:  Negative for urgency.  Neurological:  Positive for dizziness. Negative for facial asymmetry, speech difficulty, weakness and headaches.  Psychiatric/Behavioral: Negative.      Per HPI unless specifically indicated above     Objective:    BP (!) 142/86 (BP Location: Left Arm, Patient Position: Sitting, Cuff Size: Large)   Pulse 80   Temp 97.9 F (36.6 C) (Oral)   Ht 5' 7.99" (1.727 m)   Wt 255 lb 14.4 oz (116.1 kg)   SpO2 94%   BMI 38.92 kg/m   Wt Readings from Last 3 Encounters:  04/19/22 255 lb 14.4 oz (116.1 kg)  04/11/22 250 lb (113.4 kg)  03/22/22 251 lb 8 oz (114.1 kg)    Physical Exam Vitals and nursing note reviewed.  Constitutional:      General: He is awake. He is not in acute distress.    Appearance: He is well-developed and well-groomed. He is morbidly obese. He is not ill-appearing.  HENT:     Head: Normocephalic and atraumatic.     Right Ear: Hearing normal. No drainage.     Left Ear: Hearing normal. No drainage.  Eyes:     General: Lids are normal.        Right eye: No discharge.        Left eye: No discharge.     Conjunctiva/sclera: Conjunctivae normal.      Pupils: Pupils are equal, round, and reactive to light.  Neck:     Thyroid: No thyromegaly.     Vascular: No carotid bruit or JVD.  Cardiovascular:     Rate and Rhythm: Normal rate and regular rhythm.     Heart sounds: Normal heart sounds, S1 normal and S2 normal. No murmur heard.    No gallop.  Pulmonary:     Effort: Pulmonary effort is normal. No accessory muscle usage or respiratory distress.     Breath sounds: Normal breath sounds.  Abdominal:     General: Bowel sounds are normal.     Palpations: Abdomen is soft.  Musculoskeletal:        General: Normal range of motion.     Right shoulder: Normal.     Left shoulder: Normal.     Cervical back: Normal range of motion and neck supple.     Right lower leg: No edema.     Left lower leg: No edema.  Skin:    General: Skin is warm and dry.     Capillary Refill: Capillary refill takes less than 2 seconds.     Findings:  No rash.  Neurological:     Mental Status: He is alert and oriented to person, place, and time.     Cranial Nerves: Cranial nerves 2-12 are intact.     Motor: Motor function is intact.     Coordination: Coordination is intact.     Gait: Gait is intact.     Deep Tendon Reflexes: Reflexes are normal and symmetric.  Psychiatric:        Attention and Perception: Attention normal.        Mood and Affect: Mood normal.        Speech: Speech normal.        Behavior: Behavior normal. Behavior is cooperative.        Thought Content: Thought content normal.    Results for orders placed or performed during the hospital encounter of 04/11/22  Basic metabolic panel  Result Value Ref Range   Sodium 136 135 - 145 mmol/L   Potassium 4.2 3.5 - 5.1 mmol/L   Chloride 102 98 - 111 mmol/L   CO2 26 22 - 32 mmol/L   Glucose, Bld 193 (H) 70 - 99 mg/dL   BUN 29 (H) 8 - 23 mg/dL   Creatinine, Ser 3.08 0.61 - 1.24 mg/dL   Calcium 9.4 8.9 - 65.7 mg/dL   GFR, Estimated >84 >69 mL/min   Anion gap 8 5 - 15  CBC  Result Value Ref Range    WBC 9.9 4.0 - 10.5 K/uL   RBC 5.74 4.22 - 5.81 MIL/uL   Hemoglobin 16.1 13.0 - 17.0 g/dL   HCT 62.9 52.8 - 41.3 %   MCV 87.1 80.0 - 100.0 fL   MCH 28.0 26.0 - 34.0 pg   MCHC 32.2 30.0 - 36.0 g/dL   RDW 24.4 01.0 - 27.2 %   Platelets 191 150 - 400 K/uL   nRBC 0.0 0.0 - 0.2 %  Protime-INR  Result Value Ref Range   Prothrombin Time 22.5 (H) 11.4 - 15.2 seconds   INR 2.0 (H) 0.8 - 1.2  Troponin I (High Sensitivity)  Result Value Ref Range   Troponin I (High Sensitivity) 4 <18 ng/L  Troponin I (High Sensitivity)  Result Value Ref Range   Troponin I (High Sensitivity) 4 <18 ng/L      Assessment & Plan:   Problem List Items Addressed This Visit       Cardiovascular and Mediastinum   Chronic atrial fibrillation (HCC) (Chronic)    Chronic, ongoing with Coumadin use.  INR today 3.4, at goal. Continue collaboration with cardiology, recent note reviewed.  Continue all medications.      Relevant Medications   amLODipine (NORVASC) 5 MG tablet   Hypertension associated with type 2 diabetes mellitus (HCC) (Chronic)    Chronic, ongoing with BP above goal today - ?related to dizzy episodes recently, this or sugar levels. Educated him on diabetes effect on vascular system.  Continue Benazepril, HCTZ, Coreg + add on Amlodipine 5 MG daily, educated him on this medication and use + side effects.  Highly recommend he monitor BP at home at least a few mornings a week + focus on DASH diet.  Discussed with him stroke prevention goal for BP <130/90.  LABS: CMP.  Continue cardiology collaboration.  Return in 4 weeks for BP check.      Relevant Medications   Semaglutide, 1 MG/DOSE, 4 MG/3ML SOPN   amLODipine (NORVASC) 5 MG tablet   Other Relevant Orders   Comprehensive metabolic panel   TSH  Magnesium     Endocrine   Hyperlipidemia associated with type 2 diabetes mellitus (HCC) (Chronic)    Chronic, ongoing.  Continue current medication regimen and adjust as needed.  Lipid panel today.       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  Urine ALB 30 February 2023.  Not adherent with diet. - Continue NPH at current dosing and Ozempic to 1 MG weekly (stop Trulicity as prefers Ozempic) + Marcelline Deist continue at current dosing -- no Metformin due to CKD.  Consider increase to 2 MG Ozempic if A1c >7% this check. - Continue Benazepril for kidney protection.   - Focus heavily on diabetic diet and modest weight loss, decreasing carb and sugar intake at home -- have discussed at length at visits.   - Up to date foot exam, not up to date eye exam -- recommend he obtain. - Would benefit from [facility] - Recommend he check BS 3-4 times a day and document for next visit for review.  Will continue to work with CCM team to ensure adherence to medication regimen and diet, which is a major issue.   Discussed at length his A1C goal for stroke prevention being 6.5% or less.  - Return in 3 months.      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  Continue Gabapentin and renal dose as needed in future.      Relevant Medications   Semaglutide, 1 MG/DOSE, 4 MG/3ML SOPN   Other Relevant Orders   Comprehensive metabolic panel   HgB A1c   Type 2 diabetes mellitus with proteinuria (HCC) - Primary (Chronic)    Refer to diabetes with CKD plan of care.      Relevant Medications   Semaglutide, 1 MG/DOSE, 4 MG/3ML SOPN   Other  Relevant Orders   Comprehensive metabolic panel   HgB A1c     Genitourinary   CKD (chronic kidney disease) stage 3, GFR 30-59 ml/min (HCC) (Chronic)    Chronic, ongoing with T2DM.  Continue Benazepril for kidney protection, urine ALB 30 (February 2023).  Discussed with patient dependent on labs may need to renally dose Gabapentin.  Recommend heavy focus on controlling his diabetes to prevent worsening CKD and possible need for dialysis in future.  CMP today.      Relevant Orders   Comprehensive metabolic panel   Magnesium   Benign prostatic hyperplasia without lower urinary tract symptoms    Chronic, stable.  Continue current medication regimen and adjust as needed.  PSA on labs today.      Relevant Orders   PSA     Hematopoietic and Hemostatic   Acquired thrombophilia (HCC) (Chronic)    Monitor CBC with use of daily Coumadin for mechanical valve and a-fib.  Denies any current bleeding episodes.  Check CBC annually.        Other   Current use of  long term anticoagulation (Chronic)    Refer to mechanical valve plan of care.      Relevant Orders   CoaguChek XS/INR Waived   History of mechanical aortic valve replacement (Chronic)    Chronic, ongoing with Coumadin use.  INR today 3.4, in goal 2.5 to 3.5. Remain off Fenofibrate.  Elected to continue 9 MG daily of Coumadin.  Return in 4 weeks for INR check.  Return sooner to office if any bleeding or concerns.      Relevant Orders   CoaguChek XS/INR Waived   History of stroke (Chronic)    Discussed at length stroke prevention goals BP <130/90, A1c <6.5%, and LDL <70.  He continues to have poor diabetes control, which have discussed at length with him -- much is related to poor diet choices.      Long-term insulin use (HCC) (Chronic)    Chronic, ongoing.  Refer to diabetes plan of care for further.      Morbid obesity (HCC) (Chronic)    BMI 38.92 with T2DM, HTN/HLD.  Recommended eating smaller high protein, low fat meals more  frequently and exercising 30 mins a day 5 times a week with a goal of 10-15lb weight loss in the next 3 months. Patient voiced their understanding and motivation to adhere to these recommendations.       Relevant Medications   Semaglutide, 1 MG/DOSE, 4 MG/3ML SOPN   Noncompliance w/medication treatment due to intermit use of medication (Chronic)    Major concern and reiterated to patient necessity of adhering to medication and diet regimen as he is at high risk for recurrent cardiac event with current co morbidities.        Dizziness    Ongoing since CVA.  Recent CT scan remains stable, suspect much of dizziness is based on poor diabetes control, however recommend he return to neurology as they recommended (he missed 2-3 month follow-up), provided # for him to schedule.  Continue collaboration with neurology + Meclizine as needed.        Relevant Orders   Comprehensive metabolic panel   TSH   Magnesium     Follow up plan: Return in about 4 weeks (around 05/17/2022) for INR CHECK, DIZZINESS, AND INR CHECK.

## 2022-04-19 NOTE — Assessment & Plan Note (Addendum)
Refer to diabetes with CKD plan of care.  Continue Gabapentin and renal dose as needed in future.

## 2022-04-19 NOTE — Assessment & Plan Note (Signed)
Chronic, ongoing with A1c 7.1% last visit, recheck today -- has not scheduled with endo as recommended, not always compliant with regimen and recommendations.  Urine ALB 30 February 2023.  Not adherent with diet. - Continue NPH at current dosing and Ozempic to 1 MG weekly (stop Trulicity as prefers Ozempic) + Marcelline Deist continue at current dosing -- no Metformin due to CKD.  Consider increase to 2 MG Ozempic if A1c >7% this check. - Continue Benazepril for kidney protection.   - Focus heavily on diabetic diet and modest weight loss, decreasing carb and sugar intake at home -- have discussed at length at visits.   - Up to date foot exam, not up to date eye exam -- recommend he obtain. - Would benefit from [facility] - Recommend he check BS 3-4 times a day and document for next visit for review.  Will continue to work with CCM team to ensure adherence to medication regimen and diet, which is a major issue.   Discussed at length his A1C goal for stroke prevention being 6.5% or less.  - Return in 3 months.

## 2022-04-19 NOTE — Assessment & Plan Note (Signed)
Chronic, ongoing with T2DM.  Continue Benazepril for kidney protection, urine ALB 30 (February 2023).  Discussed with patient dependent on labs may need to renally dose Gabapentin.  Recommend heavy focus on controlling his diabetes to prevent worsening CKD and possible need for dialysis in future.  CMP today.

## 2022-04-19 NOTE — Assessment & Plan Note (Signed)
Chronic, stable.  Continue current medication regimen and adjust as needed.  PSA on labs today.

## 2022-04-19 NOTE — Assessment & Plan Note (Signed)
Refer to diabetes with CKD plan of care. °

## 2022-04-19 NOTE — Assessment & Plan Note (Addendum)
Chronic, ongoing with BP above goal today - ?related to dizzy episodes recently, this or sugar levels. Educated him on diabetes effect on vascular system.  Continue Benazepril, HCTZ, Coreg + add on Amlodipine 5 MG daily, educated him on this medication and use + side effects.  Highly recommend he monitor BP at home at least a few mornings a week + focus on DASH diet.  Discussed with him stroke prevention goal for BP <130/90.  LABS: CMP.  Continue cardiology collaboration.  Return in 4 weeks for BP check.

## 2022-04-19 NOTE — Assessment & Plan Note (Signed)
Chronic, ongoing with Coumadin use.  INR today 3.4, at goal. Continue collaboration with cardiology, recent note reviewed.  Continue all medications.

## 2022-04-19 NOTE — Assessment & Plan Note (Signed)
Chronic, ongoing.  Refer to diabetes plan of care for further.

## 2022-04-19 NOTE — Assessment & Plan Note (Signed)
Major concern and reiterated to patient necessity of adhering to medication and diet regimen as he is at high risk for recurrent cardiac event with current co morbidities.

## 2022-04-19 NOTE — Assessment & Plan Note (Signed)
Chronic, ongoing.  Continue current medication regimen and adjust as needed. Lipid panel today. 

## 2022-04-19 NOTE — Assessment & Plan Note (Signed)
Chronic, ongoing with Coumadin use.  INR today 3.4, in goal 2.5 to 3.5. Remain off Fenofibrate.  Elected to continue 9 MG daily of Coumadin.  Return in 4 weeks for INR check.  Return sooner to office if any bleeding or concerns.

## 2022-04-19 NOTE — Assessment & Plan Note (Signed)
Discussed at length stroke prevention goals BP <130/90, A1c <6.5%, and LDL <70.  He continues to have poor diabetes control, which have discussed at length with him -- much is related to poor diet choices.

## 2022-04-19 NOTE — Assessment & Plan Note (Signed)
Refer to mechanical valve plan of care. 

## 2022-04-20 ENCOUNTER — Other Ambulatory Visit: Payer: Self-pay | Admitting: Nurse Practitioner

## 2022-04-20 LAB — LIPID PANEL W/O CHOL/HDL RATIO
Cholesterol, Total: 133 mg/dL (ref 100–199)
HDL: 25 mg/dL — ABNORMAL LOW (ref >39)
LDL Chol Calc (NIH): 53 mg/dL (ref 0–99)
Triglycerides: 358 mg/dL — ABNORMAL HIGH (ref 0–149)
VLDL Cholesterol Cal: 55 mg/dL — ABNORMAL HIGH (ref 5–40)

## 2022-04-20 LAB — COMPREHENSIVE METABOLIC PANEL
ALT: 57 IU/L — ABNORMAL HIGH (ref 0–44)
AST: 34 IU/L (ref 0–40)
Albumin/Globulin Ratio: 1.4 (ref 1.2–2.2)
Albumin: 4.2 g/dL (ref 3.9–4.9)
Alkaline Phosphatase: 97 IU/L (ref 44–121)
BUN/Creatinine Ratio: 19 (ref 10–24)
BUN: 24 mg/dL (ref 8–27)
Bilirubin Total: 0.3 mg/dL (ref 0.0–1.2)
CO2: 26 mmol/L (ref 20–29)
Calcium: 9.4 mg/dL (ref 8.6–10.2)
Chloride: 104 mmol/L (ref 96–106)
Creatinine, Ser: 1.24 mg/dL (ref 0.76–1.27)
Globulin, Total: 3.1 g/dL (ref 1.5–4.5)
Glucose: 52 mg/dL — ABNORMAL LOW (ref 70–99)
Potassium: 4 mmol/L (ref 3.5–5.2)
Sodium: 142 mmol/L (ref 134–144)
Total Protein: 7.3 g/dL (ref 6.0–8.5)
eGFR: 66 mL/min/{1.73_m2} (ref >59)

## 2022-04-20 LAB — HEMOGLOBIN A1C
Est. average glucose Bld gHb Est-mCnc: 180 mg/dL
Hgb A1c MFr Bld: 7.9 % — ABNORMAL HIGH (ref 4.8–5.6)

## 2022-04-20 LAB — COAGUCHEK XS/INR WAIVED
INR: 3.4 — ABNORMAL HIGH (ref 0.9–1.1)
Prothrombin Time: 41.2 s

## 2022-04-20 LAB — TSH: TSH: 1.56 u[IU]/mL (ref 0.450–4.500)

## 2022-04-20 LAB — MAGNESIUM: Magnesium: 2 mg/dL (ref 1.6–2.3)

## 2022-04-20 LAB — PSA: Prostate Specific Ag, Serum: 0.4 ng/mL (ref 0.0–4.0)

## 2022-04-20 MED ORDER — SEMAGLUTIDE (1 MG/DOSE) 4 MG/3ML ~~LOC~~ SOPN: 1.0000 mg | PEN_INJECTOR | SUBCUTANEOUS | 4 refills | Status: DC

## 2022-04-20 NOTE — Progress Notes (Signed)
Good morning, please let Kalev know his labs have returned: - Sugar was low at 52 on labs, I do recommend you monitor these closely at home, especially when dizzy and document.  If they are running under 70 at any point ensure you get a small snack to help bring it back up, like peanut butter crackers. - Kidney function is normal.  Liver function shows mild elevation in ALT -- we will continue to monitor this. - A1c has trended up from 7.1% to 7.9%, definitely restart your Ozempic and continue remainder of medications. - Thyroid and prostate screening labs are normal. - Cholesterol labs show LDL below goal, triglycerides remain a bit elevated -- continue current medications.  Any questions? Keep being stellar!!  Thank you for allowing me to participate in your care.  I appreciate you. Kindest regards, Madalyn Legner

## 2022-05-06 ENCOUNTER — Emergency Department: Admission: EM | Admit: 2022-05-06 | Discharge: 2022-05-06 | Discharge status: Against Medical Advice | Payer: 59

## 2022-05-10 ENCOUNTER — Telehealth: Payer: Self-pay

## 2022-05-10 NOTE — Telephone Encounter (Signed)
Transition Care Management Follow-up Telephone Call Date of discharge and from where: 05/06/22 How have you been since you were released from the hospital? I was never seen because of the wait. I went to the dentist this morning and got my tooth taken care of.  Any questions or concerns? No  Items Reviewed: Did the pt receive and understand the discharge instructions provided? Yes  Medications obtained and verified? Yes  Other? No  Any new allergies since your discharge? No  Dietary orders reviewed? Yes Do you have support at home? Yes   Home Care and Equipment/Supplies: Were home health services ordered? no If so, what is the name of the agency? N/A  Has the agency set up a time to come to the patient's home? not applicable Were any new equipment or medical supplies ordered?  No What is the name of the medical supply agency? N/A Were you able to get the supplies/equipment? not applicable Do you have any questions related to the use of the equipment or supplies? No  Functional Questionnaire: (I = Independent and D = Dependent) ADLs: I  Bathing/Dressing- I  Meal Prep- I  Eating- I  Maintaining continence- I  Transferring/Ambulation- I  Managing Meds- I  Follow up appointments reviewed:  PCP Hospital f/u appt confirmed? Yes  Scheduled to see Marnee Guarneri, DNP on 05/17/22 @ 4:20 PM. [facility] f/u appt confirmed? No   Are transportation arrangements needed? No  If their condition worsens, is the pt aware to call PCP or go to the Emergency Dept.? Yes Was the patient provided with contact information for the PCP's office or ED? Yes Was to pt encouraged to call back with questions or concerns? Yes

## 2022-05-13 ENCOUNTER — Telehealth: Payer: Self-pay

## 2022-05-13 ENCOUNTER — Telehealth: Payer: Self-pay | Admitting: Nurse Practitioner

## 2022-05-13 MED ORDER — TRULICITY 1.5 MG/0.5ML ~~LOC~~ SOAJ: 1.5000 mg | SUBCUTANEOUS | 4 refills | Status: DC

## 2022-05-13 MED ORDER — SEMAGLUTIDE (1 MG/DOSE) 4 MG/3ML ~~LOC~~ SOPN: 1.0000 mg | PEN_INJECTOR | SUBCUTANEOUS | 4 refills | Status: DC

## 2022-05-13 NOTE — Telephone Encounter (Signed)
Notified patient, patient stated that he will not take the trulicity and wants Ozempic, PA will be started.

## 2022-05-13 NOTE — Telephone Encounter (Deleted)
Patient notified

## 2022-05-13 NOTE — Telephone Encounter (Signed)
PA started for Ozempic 1mg /66ml through [employer] med[employer]. Awaiting on determination

## 2022-05-13 NOTE — Telephone Encounter (Signed)
Semaglutide, 1 MG/DOSE, 4 MG/3ML SOPN needs PA / pts insurance prefers Trulicty / please advise asap

## 2022-05-13 NOTE — Addendum Note (Signed)
Addended by: Marnee Guarneri T on: 05/13/2022 04:49 PM   Modules accepted: Orders

## 2022-05-16 ENCOUNTER — Telehealth: Payer: Self-pay

## 2022-05-16 NOTE — Telephone Encounter (Signed)
PA for Ozempic has been denied through insurance

## 2022-05-17 ENCOUNTER — Ambulatory Visit: Payer: 59 | Admitting: Nurse Practitioner

## 2022-05-17 DIAGNOSIS — Z952 Presence of prosthetic heart valve: Secondary | ICD-10-CM

## 2022-05-17 DIAGNOSIS — Z7901 Long term (current) use of anticoagulants: Secondary | ICD-10-CM

## 2022-05-24 ENCOUNTER — Other Ambulatory Visit: Payer: Self-pay | Admitting: Nurse Practitioner

## 2022-05-24 NOTE — Telephone Encounter (Signed)
Requested medication (s) are due for refill today: yes  Requested medication (s) are on the active medication list: yes  Last refill:  09/22/21 #180 1 RF  Future visit scheduled: yes tomorrow  Notes to clinic:  routing to provider- manual review   Requested Prescriptions  Pending Prescriptions Disp Refills   warfarin (COUMADIN) 5 MG tablet [Pharmacy Med Name: WARFARIN SODIUM 5 MG TABLET] 90 tablet 4    Sig: START TAKING 9 MG DAILY -- ONE 4 MG PILL AND ONE 5 MG PILL.     Hematology:  Anticoagulants - warfarin Failed - 05/24/2022  1:31 AM      Failed - Manual Review: If patient's warfarin is managed by Anti-Coag team, route request to them. If not, route request to the provider.      Failed - INR in normal range and within 30 days    INR  Date Value Ref Range Status  04/19/2022 3.4 (H) 0.9 - 1.1 Final         Passed - HCT in normal range and within 360 days    HCT  Date Value Ref Range Status  04/11/2022 50.0 39.0 - 52.0 % Final   Hematocrit  Date Value Ref Range Status  12/31/2021 WILL FOLLOW  Preliminary         Passed - Patient is not pregnant      Passed - Valid encounter within last 3 months    Recent Outpatient Visits           1 month ago Type 2 diabetes mellitus with proteinuria ([place])   [place], Jolene T, NP   2 months ago History of mechanical aortic valve replacement   [place], [place] T, NP   2 months ago History of mechanical aortic valve replacement   [facility] [place], [place] T, NP   2 months ago History of mechanical aortic valve replacement   [place] [facility] [place], [place] T, NP   3 months ago History of mechanical aortic valve replacement   [place], Barbaraann Faster, NP       Future Appointments             Tomorrow Cannady, Barbaraann Faster, NP [place], [facility]

## 2022-05-25 ENCOUNTER — Ambulatory Visit (INDEPENDENT_AMBULATORY_CARE_PROVIDER_SITE_OTHER): Payer: 59 | Admitting: Nurse Practitioner

## 2022-05-25 ENCOUNTER — Encounter: Payer: Self-pay | Admitting: Nurse Practitioner

## 2022-05-25 VITALS — BP 135/77 | HR 74 | Temp 97.6°F | Ht 67.99 in | Wt 257.3 lb

## 2022-05-25 DIAGNOSIS — Z794 Long term (current) use of insulin: Secondary | ICD-10-CM

## 2022-05-25 DIAGNOSIS — Z952 Presence of prosthetic heart valve: Secondary | ICD-10-CM | POA: Diagnosis not present

## 2022-05-25 DIAGNOSIS — N1831 Chronic kidney disease, stage 3a: Secondary | ICD-10-CM

## 2022-05-25 DIAGNOSIS — E1122 Type 2 diabetes mellitus with diabetic chronic kidney disease: Secondary | ICD-10-CM

## 2022-05-25 DIAGNOSIS — Z7901 Long term (current) use of anticoagulants: Secondary | ICD-10-CM | POA: Diagnosis not present

## 2022-05-25 LAB — COAGUCHEK XS/INR WAIVED
INR: 3.6 — ABNORMAL HIGH (ref 0.9–1.1)
Prothrombin Time: 43.5 s

## 2022-05-25 MED ORDER — TRULICITY 1.5 MG/0.5ML ~~LOC~~ SOAJ: 1.5000 mg | SUBCUTANEOUS | 4 refills | Status: DC

## 2022-05-25 NOTE — Progress Notes (Signed)
   BP 135/77   Pulse 74   Temp 97.6 F (36.4 C) (Oral)   Ht 5' 7.99" (1.727 m)   Wt 257 lb 4.8 oz (116.7 kg)   SpO2 96%   BMI 39.13 kg/m    Subjective:    Patient ID: Rodney Plumber [name], male    DOB: Sep 28, 1960, 62 y.o.   MRN: [id number]  CC: Coumadin management  HPI: This patient is a 62 y.o. male who presents for coumadin management. The expected duration of coumadin treatment is lifelong. The reason for anticoagulation is  mechanical heart valve. Follows with cardiology, Dr. Saralyn Pilar, last on 01/24/22.  Echo 03/13/2020 with LVEF greater then 55%, stable valve.  He has stopped Fenofibrate due to elevation in INR with this on board in past and risk for interaction.  Recently took antibiotic therapy for tooth infection -- Penicillin and Amoxicillin for 14 days. Took some Ibuprofen too for pain.  Insurance no longer covering [place], which upsets him as preferred Ozempic -- does not like Trulicity pen as more uncomfortable and refuses to inject himself -- discussed having wife inject it for him weekly or coming to office weekly for [place] to inject as needs GLP1 on board, benefit to weight loss and diabetes control.  At length discussion.  Present Coumadin dose: 9 MG daily at this time Goal: 2.5-3.5 The patient does not have an active anticoagulation episode. Excessive bruising: no Nose bleeding: no Rectal bleeding: no Eating diet with consistent amounts of foods containing Vitamin K:no Any recent antibiotic use? yes, as above  Relevant past medical, surgical, family and social history reviewed and updated as indicated. Interim medical history since our last visit reviewed. Allergies and medications reviewed and updated.  ROS: Per HPI unless specifically indicated above     Objective:    BP 135/77   Pulse 74   Temp 97.6 F (36.4 C) (Oral)   Ht 5' 7.99" (1.727 m)   Wt 257 lb 4.8 oz (116.7 kg)   SpO2 96%   BMI 39.13 kg/m   Wt Readings from Last 3 Encounters:  05/25/22 257 lb 4.8  oz (116.7 kg)  04/19/22 255 lb 14.4 oz (116.1 kg)  04/11/22 250 lb (113.4 kg)    General: Well appearing, well nourished in no distress.  Normal mood and affect. Skin: No excessive bruising or rash  Last INR: 3.4  Last CBC:  Lab Results  Component Value Date   WBC 9.9 04/11/2022   HGB 16.1 04/11/2022   HCT 50.0 04/11/2022   MCV 87.1 04/11/2022   PLT 191 04/11/2022    Results for orders placed or performed in visit on 05/25/22  CoaguChek XS/INR Waived  Result Value Ref Range   INR 3.6 (H) 0.9 - 1.1   Prothrombin Time 43.5 sec    Assessment:     ICD-10-CM   1. History of mechanical aortic valve replacement  Z95.2 CoaguChek XS/INR Waived    2. Current use of long term anticoagulation  Z79.01 CoaguChek XS/INR Waived    3. Type 2 diabetes mellitus with stage 3a chronic kidney disease, with long-term current use of insulin (HCC)  E11.22    N18.31    Z79.4       Plan:   Discussed current plan face-to-face with patient. For coumadin dosing, continue 9 MG daily.  Will plan to recheck INR in 4 week.

## 2022-05-25 NOTE — Assessment & Plan Note (Signed)
Refer to mechanical valve plan of care. 

## 2022-05-25 NOTE — Assessment & Plan Note (Addendum)
Chronic, ongoing with A1c 7.9% last visit -- has not scheduled with endo as recommended, not always compliant with regimen and recommendations.  Urine ALB 30 February 2023.  Not adherent with diet. - Continue NPH at current dosing and Trulicity 1.5 MG weekly (he prefers Ozempic, but insurance not covering -- discussed at length with him need to take GLP1 due to benefit to his diabetes) + [place] continue at current dosing -- no Metformin due to CKD.  Consider increase to 3 MG Trulicity if I4P >3% in future. - Continue Benazepril for kidney protection.   - Focus heavily on diabetic diet and modest weight loss, decreasing carb and sugar intake at home -- have discussed at length at visits.   - Up to date foot exam, not up to date eye exam -- recommend he obtain. - Would benefit from [place] he check BS 3-4 times a day and document for next visit for review.  Will continue to work with CCM team to ensure adherence to medication regimen and diet, which is a major issue.   Discussed at length his A1C goal for stroke prevention being 6.5% or less.  - Return in 3 months.

## 2022-05-25 NOTE — Patient Instructions (Signed)
Warfarin Blood Tests You will learn about the importance of your warfarin dosing, testing, and precautions. Also, signs that your warfarin dose is too high. To view the content, go to this web address: [email or web address]  This video will expire on: 01/12/2024. If you need access to this video following this date, please reach out to the healthcare provider who assigned it to you. This information is not intended to replace advice given to you by your health care provider. Make sure you discuss any questions you have with your health care provider. Elsevier Patient Education  2023 Elsevier Inc.  

## 2022-05-25 NOTE — Assessment & Plan Note (Signed)
Chronic, ongoing with Coumadin use.  INR today 3.6 today with recent abx therapy. Remain off Fenofibrate.  Elected to continue 9 MG daily of Coumadin.  Return in 4 weeks for INR check.  Return sooner to office if any bleeding or concerns.

## 2022-06-12 NOTE — Patient Instructions (Incomplete)
Vitamin K Foods and Warfarin Warfarin is a blood thinner (anticoagulant). Anticoagulant medicines help prevent blood clots from forming or getting bigger. Warfarin works by blocking the activity of vitamin K. Vitamin K promotes normal blood clotting. When you take warfarin, problems can occur from suddenly increasing or decreasing the amount of vitamin K that you eat from one day to the next. These problems can occur due to varying levels of warfarin in your blood. Problems may include blood clots or bleeding. What are tips for eating the right amount of vitamin K? Reading food labels Know which foods contain vitamin K. Read food labels. Use the lists below to understand serving sizes and the amount of vitamin K in one serving. If you take a multivitamin that contains vitamin K, be sure to take it every day. Meal planning To avoid problems when taking warfarin: Eat a balanced diet that includes: Fresh fruits and vegetables. Whole grains. Low-fat dairy products. Lean proteins, such as fish, eggs, and lean cuts of meat. Avoid major changes in your diet. If you are going to change your diet, talk with your health care provider before making changes. Keep your intake of vitamin K consistent from day to day. Avoid eating large amounts of vitamin K one day and small amounts of vitamin K the next day. Work with a dietitian to develop a meal plan that works best for you.  What foods are high in vitamin K? Foods that are high in vitamin K contain more than 100 mcg (micrograms) per serving. These include: Broccoli (cooked from fresh) -  cup (78 g) has 110 mcg. Brussels sprouts (cooked from fresh) -  cup (78 g) has 109 mcg. Greens, beet (cooked from fresh) -  cup (72 g) has 350 mcg. Greens, collard (cooked from fresh) -  cup (66 g) has 263 mcg. Greens, turnip (cooked from fresh) -  cup (72 g) has 265 mcg. Green onions or scallions -  cup (50 g) has 105 mcg. Kale (cooked from fresh) -  cup (68  g) has 536 mcg. Parsley (raw) - 10 sprigs (10 g) has 164 mcg. Spinach (cooked from fresh) -  cup (90 g) has 444 mcg. Swiss chard (cooked from fresh) -  cup (88 g) has 287 mcg. The items listed above may not be a complete list of foods high in Vitamin K. Actual amounts of Vitamin K may differ depending on processing. Contact a dietitian for more information. What foods have a moderate amount of vitamin K? Foods that have a moderate amount of vitamin K contain 25-100 mcg per serving. These include: Asparagus (cooked from fresh) - 4 spears (60 g) have 30 mcg. Black-eyed peas (dried) -  cup (85 g) has 32 mcg. Cabbage (cooked from fresh) -  cup (78 g) has 84 mcg. Cabbage (raw) -  cup (35 g) has 26 mcg. Kiwi fruit - 1 medium (69 g) has 27 mcg. Lettuce (raw) - 1 cup (36 g) has 45 mcg. Okra (cooked from fresh) -  cup (80 g) has 32 mcg. Prunes (dried) - 5 prunes (47 g) have 25 mcg. Tuna, light, canned in oil - 3 oz (85 g) has 37 mcg. Watercress (raw) - 1 cup (34 g) has 85 mcg. The items listed above may not be a complete list of foods with a moderate amount of Vitamin K. Actual amounts of Vitamin K may differ depending on processing. Contact a dietitian for more information. What foods are low in vitamin K? Foods low in   vitamin K contain less than 25 mcg per serving. These include: Artichoke - 1 medium (128 g) has 18 mcg. Avocado - 1 oz (21 g) has 6 mcg. Blueberries -  cup (73 g) has 14 mcg. Carrots (cooked from fresh) -  cup (78 g) has 11 mcg. Cauliflower (raw) -  cup (54 g) has 8 mcg. Cucumber with peel (raw) -  cup (52 g) has 9 mcg. Grapes -  cup (76 g) has 12 mcg. Mango - 1 medium (207 g) has 9 mcg. Mixed nuts - 1 cup (142 g) has 17 mcg. Pear - 1 medium (178 g) has 8 mcg. Peas (cooked from fresh) -  cup (80 g) has 20 mcg. Pickled cucumber - 1 spear (65 g) has 11 mcg. Sauerkraut (canned) -  cup (118 g) has 16 mcg. Soybeans (cooked from fresh) -  cup (86 g) has 16 mcg. Tomato  (raw) - 1 medium (123 g) has 10 mcg. Tomato sauce (raw) -  cup (123 g) has 17 mcg. The items listed above may not be a complete list of foods low in Vitamin K. Actual amounts of Vitamin K may differ depending on processing. Contact a dietitian for more information. What foods do not have vitamin K? If a food contains less than 5 mcg per serving, it is considered to have no vitamin K. These foods include: Bread and cereal products. Cheese. Eggs. Fish and shellfish. Meat and poultry. Milk and dairy products. Seeds, such as sunflower or pumpkin seeds. The items listed above may not be a complete list of foods that do not have vitamin K. Actual amounts of vitamin K may differ depending on processing. Contact a dietitian for more information. Summary Warfarin is an anticoagulant that prevents blood clots from forming or getting bigger by blocking the activity of vitamin K. It is important to know the amount of vitamin K that is in the foods you eat and to keep your intake of vitamin K consistent from day to day. Avoid major changes in your diet. If you are going to change your diet, talk with your health care provider before making changes. This information is not intended to replace advice given to you by your health care provider. Make sure you discuss any questions you have with your health care provider. Document Revised: 07/01/2020 Document Reviewed: 07/01/2020 Elsevier Patient Education  2023 [employer] Inc[employer]  

## 2022-06-13 ENCOUNTER — Ambulatory Visit: Payer: 59 | Admitting: Nurse Practitioner

## 2022-06-13 ENCOUNTER — Other Ambulatory Visit: Payer: Self-pay | Admitting: Nurse Practitioner

## 2022-06-13 DIAGNOSIS — E119 Type 2 diabetes mellitus without complications: Secondary | ICD-10-CM

## 2022-06-14 ENCOUNTER — Ambulatory Visit: Payer: Self-pay

## 2022-06-14 NOTE — Telephone Encounter (Signed)
  Chief Complaint: [place] from tractor this morning. Left thigh is hurting. Symptoms: Asking o be worked in today. Frequency: Today Pertinent Negatives: Patient denies  Disposition: [] ED /[] Urgent Care (no appt availability in office) / [] Appointment(In office/virtual)/ []  [facility]/ [] Home Care/ [] Refused Recommended Disposition /[] [facility]/ []  Follow-up with PCP Additional Notes: No answer on FC line. Pleas advise pt.

## 2022-06-14 NOTE — Telephone Encounter (Signed)
Requested Prescriptions  Pending Prescriptions Disp Refills   FARXIGA 10 MG TABS tablet [Pharmacy Med Name: FARXIGA 10 MG TABLET] 30 tablet 2    Sig: TAKE 1 TABLET BY MOUTH EVERY DAY     Endocrinology:  Diabetes - SGLT2 Inhibitors Passed - 06/13/2022  1:26 AM      Passed - Cr in normal range and within 360 days    Creatinine, Ser  Date Value Ref Range Status  04/19/2022 1.24 0.76 - 1.27 mg/dL Final         Passed - HBA1C is between 0 and 7.9 and within 180 days    Hemoglobin A1C  Date Value Ref Range Status  12/16/2015 10.4  Final   HB A1C (BAYER DCA - WAIVED)  Date Value Ref Range Status  12/31/2021 7.1 (H) 4.8 - 5.6 % Final    Comment:             Prediabetes: 5.7 - 6.4          Diabetes: >6.4          Glycemic control for adults with diabetes: <7.0    Hgb A1c MFr Bld  Date Value Ref Range Status  04/19/2022 7.9 (H) 4.8 - 5.6 % Final    Comment:             Prediabetes: 5.7 - 6.4          Diabetes: >6.4          Glycemic control for adults with diabetes: <7.0          Passed - eGFR in normal range and within 360 days    GFR calc Af Amer  Date Value Ref Range Status  06/17/2020 77 >59 mL/min/1.73 Final    Comment:    **In accordance with recommendations from the NKF-ASN Task force,**   [facility] is in the process of updating its eGFR calculation to the   2021 CKD-EPI creatinine equation that estimates kidney function   without a race variable.    GFR, Estimated  Date Value Ref Range Status  04/11/2022 >60 >60 mL/min Final    Comment:    (NOTE) Calculated using the CKD-EPI Creatinine Equation (2021)    eGFR  Date Value Ref Range Status  04/19/2022 66 >59 mL/min/1.73 Final         Passed - Valid encounter within last 6 months    Recent Outpatient Visits           2 weeks ago History of mechanical aortic valve replacement   [place], [place] T, NP   1 month ago Type 2 diabetes mellitus with proteinuria ([place])   [place], Henrine Screws T, NP   2 months ago History of mechanical aortic valve replacement   [place], [place] T, NP   3 months ago History of mechanical aortic valve replacement   [place] [facility] [place], Henrine Screws T, NP   3 months ago History of mechanical aortic valve replacement   [place], Barbaraann Faster, NP       Future Appointments             In 3 days Cannady, Barbaraann Faster, NP [place], [facility]

## 2022-06-14 NOTE — Telephone Encounter (Signed)
Answer Assessment - Initial Assessment Questions 1. MECHANISM: "How did the fall happen?"     slipped 2. DOMESTIC VIOLENCE AND ELDER ABUSE SCREENING: "Did you fall because someone pushed you or tried to hurt you?" If Yes, ask: "Are you safe now?"     No 3. ONSET: "When did the fall happen?" (e.g., minutes, hours, or days ago)     This morning 4. LOCATION: "What part of the body hit the ground?" (e.g., back, buttocks, head, hips, knees, hands, head, stomach)     Left thigh 5. INJURY: "Did you hurt (injure) yourself when you fell?" If Yes, ask: "What did you injure? Tell me more about this?" (e.g., body area; type of injury; pain severity)"     yes 6. PAIN: "Is there any pain?" If Yes, ask: "How bad is the pain?" (e.g., Scale 1-10; or mild,  moderate, severe)   - NONE (0): No pain   - MILD (1-3): Doesn't interfere with normal activities    - MODERATE (4-7): Interferes with normal activities or awakens from sleep    - SEVERE (8-10): Excruciating pain, unable to do any normal activities      7 7. SIZE: For cuts, bruises, or swelling, ask: "How large is it?" (e.g., inches or centimeters)      No 8. PREGNANCY: "Is there any chance you are pregnant?" "When was your last menstrual period?"     N/a 9. OTHER SYMPTOMS: "Do you have any other symptoms?" (e.g., dizziness, fever, weakness; new onset or worsening).      No 10. CAUSE: "What do you think caused the fall (or falling)?" (e.g., tripped, dizzy spell)       No  Protocols used: Falls and [facility]

## 2022-06-14 NOTE — Telephone Encounter (Signed)
Per Jolene, "I will not be able to work in this late. He will need to head to urgent care, that way they can do imaging ASAP too.  [facility] imaging availability." I let the patient know this and he verbalized understanding.

## 2022-06-17 ENCOUNTER — Ambulatory Visit: Payer: 59 | Admitting: Nurse Practitioner

## 2022-06-17 DIAGNOSIS — Z952 Presence of prosthetic heart valve: Secondary | ICD-10-CM

## 2022-06-17 DIAGNOSIS — Z7901 Long term (current) use of anticoagulants: Secondary | ICD-10-CM

## 2022-06-20 ENCOUNTER — Other Ambulatory Visit: Payer: Self-pay | Admitting: Nurse Practitioner

## 2022-06-21 ENCOUNTER — Ambulatory Visit: Payer: Self-pay

## 2022-06-21 ENCOUNTER — Ambulatory Visit: Payer: 59 | Admitting: Nurse Practitioner

## 2022-06-21 NOTE — Telephone Encounter (Signed)
Requested Prescriptions  Pending Prescriptions Disp Refills   tamsulosin (FLOMAX) 0.4 MG CAPS capsule [Pharmacy Med Name: TAMSULOSIN HCL 0.4 MG CAPSULE] 90 capsule 0    Sig: TAKE 1 CAPSULE BY MOUTH EVERY DAY AFTER SUPPER     Urology: Alpha-Adrenergic Blocker Passed - 06/20/2022  9:32 AM      Passed - PSA in normal range and within 360 days    Prostate Specific Ag, Serum  Date Value Ref Range Status  04/19/2022 0.4 0.0 - 4.0 ng/mL Final    Comment:    Roche ECLIA methodology. According to the American Urological Association, Serum PSA should decrease and remain at undetectable levels after radical prostatectomy. The AUA defines biochemical recurrence as an initial PSA value 0.2 ng/mL or greater followed by a subsequent confirmatory PSA value 0.2 ng/mL or greater. Values obtained with different assay methods or kits cannot be used interchangeably. Results cannot be interpreted as absolute evidence of the presence or absence of malignant disease.          Passed - Last BP in normal range    BP Readings from Last 1 Encounters:  05/25/22 135/77         Passed - Valid encounter within last 12 months    Recent Outpatient Visits           3 weeks ago History of mechanical aortic valve replacement   [place], [place] T, NP   2 months ago Type 2 diabetes mellitus with proteinuria ([place])   [place], Henrine Screws T, NP   3 months ago History of mechanical aortic valve replacement   [place], [place] T, NP   3 months ago History of mechanical aortic valve replacement   [place], [place] T, NP   3 months ago History of mechanical aortic valve replacement   [place], [place] T, NP               gabapentin (NEURONTIN) 300 MG capsule [Pharmacy Med Name: GABAPENTIN 300 MG CAPSULE] 360 capsule 0    Sig: TAKE 600 MG (TWO  CAPSULES) BY MOUTH IN THE MORNING, TAKE 900 MG (3 CAPSULES) BY MOUTH AT LUNCH, AND THEN TAKE 1200 MG (4 CAPSULES) BY MOUTH AT NIGHT BEFORE BED.     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[place])   [place], [place] T, NP   3 months ago History of mechanical aortic valve replacement   [place], [place] T, NP   3 months ago History of mechanical aortic valve replacement   [place], Lester T, NP   3 months ago History of mechanical aortic valve replacement   [facility]  [place], [place] T, NP               warfarin (COUMADIN) 2.5 MG tablet [Pharmacy Med Name: WARFARIN SODIUM 2.5 MG TABLET] 90 tablet 4    Sig: TAKE 1 TABLET BY MOUTH EVERY DAY     Hematology:  Anticoagulants - warfarin Failed - 06/20/2022  9:32 AM      Failed - Manual Review: If patient's warfarin is managed by Anti-Coag team, route request to them. If not, route request to the provider.      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

## 2022-06-21 NOTE — Telephone Encounter (Signed)
Requested medication (s) are due for refill today: yes  Requested medication (s) are on the active medication list: yes  Last refill:  02/01/21  Future visit scheduled: yes  Notes to clinic:  Manual Review: If patient's warfarin is managed by Anti-Coag team, route request to them. If not, route request to the provider.  Requested Prescriptions  Pending Prescriptions Disp Refills   warfarin (COUMADIN) 2.5 MG tablet [Pharmacy Med Name: WARFARIN SODIUM 2.5 MG TABLET] 90 tablet 4    Sig: TAKE 1 TABLET BY MOUTH EVERY DAY     Hematology:  Anticoagulants - warfarin Failed - 06/20/2022  9:32 AM      Failed - Manual Review: If patient's warfarin is managed by Anti-Coag team, route request to them. If not, route request to the provider.      Failed - INR in normal range and within 30 days    INR  Date Value Ref Range Status  05/25/2022 3.6 (H) 0.9 - 1.1 Final         Passed - HCT in normal range and within 360 days    HCT  Date Value Ref Range Status  04/11/2022 50.0 39.0 - 52.0 % Final   Hematocrit  Date Value Ref Range Status  12/31/2021 WILL FOLLOW  Preliminary         Passed - Patient is not pregnant      Passed - Valid encounter within last 3 months    Recent Outpatient Visits           3 weeks ago History of mechanical aortic valve replacement   [place], [place] T, NP   2 months ago Type 2 diabetes mellitus with proteinuria ([place])   [place], [place] T, NP   3 months ago History of mechanical aortic valve replacement   [place], Randall T, NP   3 months ago History of mechanical aortic valve replacement   [place], Harrod T, NP   3 months ago History of mechanical aortic valve replacement   [place], Hagarville T, NP              Signed Prescriptions Disp Refills   tamsulosin (FLOMAX) 0.4 MG  CAPS capsule 90 capsule 0    Sig: TAKE 1 CAPSULE BY MOUTH EVERY DAY AFTER SUPPER     Urology: Alpha-Adrenergic Blocker Passed - 06/20/2022  9:32 AM      Passed - PSA in normal range and within 360 days    Prostate Specific Ag, Serum  Date Value Ref Range Status  04/19/2022 0.4 0.0 - 4.0 ng/mL Final    Comment:    Roche ECLIA methodology. According to the American Urological Association, Serum PSA should decrease and remain at undetectable levels after radical prostatectomy. The AUA defines biochemical recurrence as an initial PSA value 0.2 ng/mL or greater followed by a subsequent confirmatory PSA value 0.2 ng/mL or greater. Values obtained with different assay methods or kits cannot be used interchangeably. Results cannot be interpreted as absolute evidence of the presence or absence of malignant disease.          Passed - Last BP in normal range    BP Readings from Last 1 Encounters:  05/25/22 135/77         Passed - Valid encounter within last 12 months    Recent Outpatient Visits  3 weeks ago History of mechanical aortic valve replacement   [place], [place]arm[place] T, NP   2 months ago Type 2 diabetes mellitus with proteinuria ([place])   [place], Henrine Screws T, NP   3 months ago History of mechanical aortic valve replacement   [place], Maunaloa T, NP   3 months ago History of mechanical aortic valve replacement   [place], [place] T, NP   3 months ago History of mechanical aortic valve replacement   [place] [facility] [place], [employer] T, NP               gabapentin (NEURONTIN) 300 MG capsule 360 capsule 0    Sig: TAKE 600 MG (TWO CAPSULES) BY MOUTH IN THE MORNING, TAKE 900 MG (3 CAPSULES) BY MOUTH AT LUNCH, AND THEN TAKE 1200 MG (4 CAPSULES) BY MOUTH AT NIGHT BEFORE BED.     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[place])   [place] [employer] [employer], [place]rall T, NP   3 months ago History of mechanical aortic valve replacement   [place], [place] T, NP   3 months ago History of mechanical aortic valve replacement   [place], [place] T, NP   3 months ago History of mechanical aortic valve replacement   [place], Henrine Screws T, NP                    Requested Prescriptions  Pending Prescriptions Disp Refills   warfarin (COUMADIN) 2.5 MG tablet [Pharmacy Med Name: WARFARIN SODIUM 2.5 MG TABLET] 90 tablet 4    Sig: TAKE 1 TABLET BY MOUTH EVERY DAY     Hematology:  Anticoagulants - warfarin Failed - 06/20/2022  9:32 AM      Failed - Manual Review: If patient's warfarin is managed by Anti-Coag team, route request to them. If not, route request to the provider.      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[place])   [place], J[place]mav[place] T, NP   3 months ago History of mechanical aortic valve replacement   [place], Henrine Screws T, NP   3 months ago History of mechanical aortic valve replacement   [place], Clayton T, NP   3 months ago History of mechanical aortic valve replacement   [place] [facility] [place], [place] T, NP              Signed Prescriptions Disp Refills   tamsulosin (FLOMAX) 0.4 MG CAPS capsule 90 capsule 0    Sig: TAKE 1 CAPSULE BY MOUTH EVERY DAY AFTER SUPPER     Urology: Alpha-Adrenergic Blocker Passed - 06/20/2022  9:32 AM      Passed - PSA in normal range and within 360 days    Prostate Specific Ag, Serum  Date Value Ref Range Status  04/19/2022 0.4 0.0 - 4.0 ng/mL Final    Comment:    Roche ECLIA methodology. According to the American Urological Association, Serum PSA should decrease and remain at undetectable levels after radical prostatectomy. The AUA defines biochemical recurrence as an initial PSA value 0.2 ng/mL or greater followed by a subsequent confirmatory PSA value 0.2 ng/mL or greater. Values obtained with different assay methods or kits cannot be used interchangeably. Results cannot be interpreted as absolute evidence of the presence or absence of malignant disease.          Passed - Last BP in normal range    BP Readings from Last 1 Encounters:  05/25/22 135/77         Passed - Valid encounter within last 12 months    Recent Outpatient Visits           3 weeks ago History of mechanical aortic valve replacement   [place], Brewster T, NP   2 months ago Type 2 diabetes mellitus with proteinuria ([place])   [place],  Henrine Screws T, NP   3 months ago History of mechanical aortic valve replacement   [place] Imbler, Amana T, NP   3 months ago History of mechanical aortic valve replacement   [place], [place] Level T, NP   3 months ago History of mechanical aortic valve replacement   [facility] [place], [place] T, NP               gabapentin (NEURONTIN) 300 MG capsule 360 capsule 0    Sig: TAKE 600 MG (TWO CAPSULES) BY MOUTH IN THE MORNING, TAKE 900 MG (3 CAPSULES) BY MOUTH AT LUNCH, AND THEN TAKE 1200 MG (4 CAPSULES) BY MOUTH AT NIGHT BEFORE BED.     Neurology: Anticonvulsants - gabapentin Passed - 06/20/2022  9:32 AM      Passed - Cr in normal range and within 360 days    Creatinine, Ser  Date Value Ref Range Status  04/19/2022 1.24 0.76 - 1.27 mg/dL Final         Passed - Completed PHQ-2 or PHQ-9 in the last 360 days      Passed - Valid encounter within last 12 months    Recent Outpatient Visits  3 weeks ago History of mechanical aortic valve replacement   [place], [place] T, NP   2 months ago Type 2 diabetes mellitus with proteinuria ([place])   [place], Henrine Screws T, NP   3 months ago History of mechanical aortic valve replacement   [place], Henrine Screws T, NP   3 months ago History of mechanical aortic valve replacement   [place], Henrine Screws T, NP   3 months ago History of mechanical aortic valve replacement   [place] Dustin, Barbaraann Faster, NP

## 2022-06-21 NOTE — Telephone Encounter (Signed)
Patient's wife was called and informed that PA for Ozempic has been denied

## 2022-06-21 NOTE — Telephone Encounter (Signed)
Pt wife asked if we could provide an update on PA. Stated pt is not using the medication he was given he is gaining a lot of weight and she is concerned. Stated pt likes Ozempic.   Please advise.

## 2022-06-21 NOTE — Telephone Encounter (Signed)
  Chief Complaint: Wound on rt leg Symptoms: Pain Frequency: last Tuesday Pertinent Negatives: Patient denies fever Disposition: [] ED /[] Urgent Care (no appt availability in office) / [] Appointment(In office/virtual)/ []  [place] [facility]/ [x] Home Care/ [] Refused Recommended Disposition /[] [facility]/ [x]  Follow-up with PCP Additional Notes: PT states he feel off of his tractor 1 week ago. Pt is unsure exactly when wound occurred. PT has been caring for injury with triple antibiotic ointment and bandage.  PT thinks it is healing fine.   PT does report pain on his left side from the fall that is not resolved with Tylenol.  Pt would like pain medication called in if possible.  Pt also needs order for labs placed.  Please advise.    Summary: red spot that looks like a hole above his ankle on the front side of his right leg.   Pt wife stated pt fell last Tuesday on his left side, still limping and hoping. Wife stated that he has a red spot that looks like a hole above his ankle on the front side of his right leg. Wife mentioned that she is concerned due to pt being a diabetic she also mentioned pt does not want to make an appointment if he has to go to the new location.  Pt was not with wife at the time of call pt is at work.  Please return call to pt directly - [phone]  Seeking clinical advice.     Reason for Disposition  Minor cut or scratch  Answer Assessment - Initial Assessment Questions 1. APPEARANCE of INJURY: "What does the injury look like?"      Looks like a hole 2. SIZE: "How large is the cut?"      About the size of a quarter 3. BLEEDING: "Is it bleeding now?" If Yes, ask: "Is it difficult to stop?"      no 4. LOCATION: "Where is the injury located?"      Right leg inside. 5. ONSET: "How long ago did the injury occur?"      1 week 6. MECHANISM: "Tell me how it happened."      [place] off tractor 7. TETANUS: "When was the last tetanus booster?"      8. PREGNANCY: "Is there any chance you are pregnant?" "When was your last menstrual period?"  Protocols used: Skin Injury-A-AH

## 2022-06-21 NOTE — Telephone Encounter (Signed)
Pt states he will wait until next week at his appointment

## 2022-06-23 ENCOUNTER — Ambulatory Visit (INDEPENDENT_AMBULATORY_CARE_PROVIDER_SITE_OTHER): Payer: 59 | Admitting: Physician Assistant

## 2022-06-23 ENCOUNTER — Other Ambulatory Visit: Payer: Self-pay | Admitting: Nurse Practitioner

## 2022-06-23 ENCOUNTER — Encounter: Payer: Self-pay | Admitting: Physician Assistant

## 2022-06-23 VITALS — BP 133/76 | HR 78 | Temp 97.7°F | Ht 67.99 in | Wt 256.7 lb

## 2022-06-23 DIAGNOSIS — M79605 Pain in left leg: Secondary | ICD-10-CM

## 2022-06-23 DIAGNOSIS — R2689 Other abnormalities of gait and mobility: Secondary | ICD-10-CM

## 2022-06-23 MED ORDER — GABAPENTIN 600 MG PO TABS: ORAL_TABLET | ORAL | 4 refills | Status: DC

## 2022-06-23 MED ORDER — OXYCODONE-ACETAMINOPHEN 5-325 MG PO TABS: 1.0000 | ORAL_TABLET | Freq: Three times a day (TID) | ORAL | 0 refills | Status: AC | PRN

## 2022-06-23 NOTE — Telephone Encounter (Signed)
Requested Prescriptions  Refused Prescriptions Disp Refills   gabapentin (NEURONTIN) 300 MG capsule [Pharmacy Med Name: GABAPENTIN 300 MG CAPSULE] 900 capsule 4    Sig: TAKE 3 CAPS BY MOUTH IN THE MORNING AND AT LUNCH, AND 4 CAPS AT NIGHT BEFORE BED     Neurology: Anticonvulsants - gabapentin Passed - 06/23/2022  5:32 PM      Passed - Cr in normal range and within 360 days    Creatinine, Ser  Date Value Ref Range Status  04/19/2022 1.24 0.76 - 1.27 mg/dL Final         Passed - Completed PHQ-2 or PHQ-9 in the last 360 days      Passed - Valid encounter within last 12 months    Recent Outpatient Visits           Today Acute leg pain, left   [place] [facility] M[facility]um, Erin E, PA-C   4 weeks ago History of mechanical aortic valve replacement   [place], Cactus T, NP   2 months ago Type 2 diabetes mellitus with proteinuria ([place])   [place], [place] T, NP   3 months ago History of mechanical aortic valve replacement   [place] [facility] [place], [place]as[place] T, NP   3 months ago History of mechanical aortic valve replacement   [place], Barbaraann Faster, NP       Future Appointments             In 6 days Cannady, Barbaraann Faster, NP [place], [facility]

## 2022-06-23 NOTE — Progress Notes (Signed)
Acute Office Visit   Patient: Rodney REITENBACH [name]   DOB: 1960/06/09   62 y.o. Male  MRN: [id number] Visit Date: 06/23/2022  Today's healthcare provider: Dani Gobble Crixus Mcaulay, PA-C  Introduced myself to the patient as a Journalist, newspaper and provided education on APPs in clinical practice.    Chief Complaint  Patient presents with   Fall    About 9 days ago, having left leg pain, patient states that it is really painful when he lays down to sleep at night   Subjective    Fall   HPI     Fall    Additional comments: About 9 days ago, having left leg pain, patient states that it is really painful when he lays down to sleep at night      Last edited by Jerelene Redden, CMA on 06/23/2022  3:45 PM.      Concerns for left leg pain   Reports he fell off escalator onto concrete about 9 days ago States he was seen by [facility] and had imaging - reports there weren't any fractures or dislocations per imaging  States he has tried to return to work this week   States he is having trouble going to sleep- reports pain in thigh, hip and lower leg  Interventions: Has been taking Tylenol, ,Ibuprofen (know he should not take this due to blood thinners but does not know what else to do), Tramadol   He has not been able to pick up the [place] that was sent in on 06/19/22 - reports insurance issues and states the pharmacy needed doctor approval to allow him to get script    Medications: Outpatient Medications Prior to Visit  Medication Sig   amLODipine (NORVASC) 5 MG tablet Take 1 tablet (5 mg total) by mouth daily.   atorvastatin (LIPITOR) 80 MG tablet Take 1 tablet (80 mg total) by mouth daily. STOP 40 MG ATORVASTATIN AND START THIS DOSE.   benazepril (LOTENSIN) 40 MG tablet Take 1 tablet (40 mg total) by mouth daily.   carvedilol (COREG) 25 MG tablet TAKE 1 TABLET (25 MG TOTAL) BY MOUTH 2 (TWO) TIMES DAILY WITH A MEAL.   Continuous Blood Gluc Sensor (FREESTYLE LIBRE 14 DAY SENSOR) MISC To  check blood sugar four times a day.   dapagliflozin propanediol (FARXIGA) 10 MG TABS tablet TAKE 1 TABLET BY MOUTH EVERY DAY   Dulaglutide (TRULICITY) 1.5 0000000 SOPN Inject 1.5 mg into the skin once a week.   gabapentin (NEURONTIN) 600 MG tablet Take 600 MG (one tablet) by mouth in morning, then 600 MG (one tablet) by mouth at lunch, and then 1200 MG (two tablets) by mouth at night before bedtime.   Glucose Blood (BLOOD GLUCOSE TEST STRIPS) STRP 1 strip by In Vitro route daily.   hydrochlorothiazide (HYDRODIURIL) 25 MG tablet TAKE 1 TABLET (25 MG TOTAL) BY MOUTH DAILY.   Insulin NPH, Human,, Isophane, (NOVOLIN N FLEXPEN) 100 UNIT/ML Kiwkpen Inject 50 Units into the skin every morning. And pen needles 1/day   Insulin Pen Needle 32G X 4 MM MISC Use with insulin pen dosing   meclizine (ANTIVERT) 12.5 MG tablet Take 1 tablet (12.5 mg total) by mouth 3 (three) times daily as needed for dizziness.   methocarbamol (ROBAXIN) 500 MG tablet TAKE 1 TABLET BY MOUTH EVERY 6 HOURS AS NEEDED FOR MUSCLE SPASMS.   tamsulosin (FLOMAX) 0.4 MG CAPS capsule TAKE 1 CAPSULE BY MOUTH EVERY DAY AFTER SUPPER  warfarin (COUMADIN) 10 MG tablet Take 10 mg by mouth daily.   warfarin (COUMADIN) 2.5 MG tablet TAKE 1 TABLET BY MOUTH EVERY DAY   warfarin (COUMADIN) 4 MG tablet Take 1 tablet (4 mg total) by mouth daily.   warfarin (COUMADIN) 5 MG tablet START TAKING 9 MG DAILY -- ONE 4 MG PILL AND ONE 5 MG PILL.   No facility-administered medications prior to visit.    Review of Systems  Musculoskeletal:  Positive for arthralgias, gait problem and myalgias.       Objective    BP 133/76   Pulse 78   Temp 97.7 F (36.5 C) (Oral)   Ht 5' 7.99" (1.727 m)   Wt 256 lb 11.2 oz (116.4 kg)   SpO2 93%   BMI 39.04 kg/m    Physical Exam Vitals reviewed.  Constitutional:      General: He is awake.     Appearance: Normal appearance. He is well-developed and well-groomed.  HENT:     Head: Normocephalic and atraumatic.   Musculoskeletal:     Right hip: No tenderness. Normal range of motion. Normal strength.     Left hip: Tenderness present. No deformity or lacerations. Normal strength.     Right upper leg: Normal.     Left upper leg: Normal.     Right knee: Normal range of motion.     Left knee: Normal range of motion.  Skin:    General: Skin is warm.     Capillary Refill: Capillary refill takes less than 2 seconds.     Findings: Abrasion present.     Comments: Several abrasions noted along calves - patient reports he scratched himself while itching  No bruising or swelling noted along left thigh, hip or calf at this time   Neurological:     General: No focal deficit present.     Mental Status: He is alert and oriented to person, place, and time. Mental status is at baseline.     Comments: Antalgic gait noted on exam   Psychiatric:        Mood and Affect: Mood normal.        Behavior: Behavior normal. Behavior is cooperative.       No results found for any visits on 06/23/22.  Assessment & Plan      No follow-ups on file.       Problem List Items Addressed This Visit   None Visit Diagnoses     Acute leg pain, left    -  Primary Acute, new concern Reports he fell hard onto concrete about 9 days ago and is having persistent pain in left leg and trouble sleeping  Reviewed referral to PT to assist with antalgic gait and rebuilding strength and mobility Will also provide 3 day supply of Oxycodone-acetaminophen 5-325 mg PO Q8hrs PRN for pain- recommend he take this at night to help with sleep and use Tylenol during the day along with warm compresses and gentle stretches - reviewed that he should not use NSAIDs due to concurrent use of anticoagulants  Reviewed using uninjured leg to lead movements and climbing stairs as needed  Reviewed imaging results from [facility] apt [facility] - no acute injuries, fractures or dislocations noted as well as most recent labs to check liver and kidney  function  Follow up as needed for persistent or progressing symptoms    Relevant Orders   Ambulatory referral to Physical Therapy   Antalgic gait  Relevant Orders   Ambulatory referral to Physical Therapy        No follow-ups on file.   I, Kayliegh Boyers E Ayssa Bentivegna, PA-C, have reviewed all documentation for this visit. The documentation on 06/27/22 for the exam, diagnosis, procedures, and orders are all accurate and complete.   Talitha Givens, MHS, PA-C [facility]

## 2022-06-26 NOTE — Patient Instructions (Incomplete)
Vitamin K Foods and Warfarin Warfarin is a blood thinner (anticoagulant). Anticoagulant medicines help prevent blood clots from forming or getting bigger. Warfarin works by blocking the activity of vitamin K. Vitamin K promotes normal blood clotting. When you take warfarin, problems can occur from suddenly increasing or decreasing the amount of vitamin K that you eat from one day to the next. These problems can occur due to varying levels of warfarin in your blood. Problems may include blood clots or bleeding. What are tips for eating the right amount of vitamin K? Reading food labels Know which foods contain vitamin K. Read food labels. Use the lists below to understand serving sizes and the amount of vitamin K in one serving. If you take a multivitamin that contains vitamin K, be sure to take it every day. Meal planning To avoid problems when taking warfarin: Eat a balanced diet that includes: Fresh fruits and vegetables. Whole grains. Low-fat dairy products. Lean proteins, such as fish, eggs, and lean cuts of meat. Avoid major changes in your diet. If you are going to change your diet, talk with your health care provider before making changes. Keep your intake of vitamin K consistent from day to day. Avoid eating large amounts of vitamin K one day and small amounts of vitamin K the next day. Work with a dietitian to develop a meal plan that works best for you.  What foods are high in vitamin K? Foods that are high in vitamin K contain more than 100 mcg (micrograms) per serving. These include: Broccoli (cooked from fresh) -  cup (78 g) has 110 mcg. Brussels sprouts (cooked from fresh) -  cup (78 g) has 109 mcg. Greens, beet (cooked from fresh) -  cup (72 g) has 350 mcg. Greens, collard (cooked from fresh) -  cup (66 g) has 263 mcg. Greens, turnip (cooked from fresh) -  cup (72 g) has 265 mcg. Green onions or scallions -  cup (50 g) has 105 mcg. Kale (cooked from fresh) -  cup (68  g) has 536 mcg. Parsley (raw) - 10 sprigs (10 g) has 164 mcg. Spinach (cooked from fresh) -  cup (90 g) has 444 mcg. Swiss chard (cooked from fresh) -  cup (88 g) has 287 mcg. The items listed above may not be a complete list of foods high in Vitamin K. Actual amounts of Vitamin K may differ depending on processing. Contact a dietitian for more information. What foods have a moderate amount of vitamin K? Foods that have a moderate amount of vitamin K contain 25-100 mcg per serving. These include: Asparagus (cooked from fresh) - 4 spears (60 g) have 30 mcg. Black-eyed peas (dried) -  cup (85 g) has 32 mcg. Cabbage (cooked from fresh) -  cup (78 g) has 84 mcg. Cabbage (raw) -  cup (35 g) has 26 mcg. Kiwi fruit - 1 medium (69 g) has 27 mcg. Lettuce (raw) - 1 cup (36 g) has 45 mcg. Okra (cooked from fresh) -  cup (80 g) has 32 mcg. Prunes (dried) - 5 prunes (47 g) have 25 mcg. Tuna, light, canned in oil - 3 oz (85 g) has 37 mcg. Watercress (raw) - 1 cup (34 g) has 85 mcg. The items listed above may not be a complete list of foods with a moderate amount of Vitamin K. Actual amounts of Vitamin K may differ depending on processing. Contact a dietitian for more information. What foods are low in vitamin K? Foods low in  vitamin K contain less than 25 mcg per serving. These include: Artichoke - 1 medium (128 g) has 18 mcg. Avocado - 1 oz (21 g) has 6 mcg. Blueberries -  cup (73 g) has 14 mcg. Carrots (cooked from fresh) -  cup (78 g) has 11 mcg. Cauliflower (raw) -  cup (54 g) has 8 mcg. Cucumber with peel (raw) -  cup (52 g) has 9 mcg. Grapes -  cup (76 g) has 12 mcg. Mango - 1 medium (207 g) has 9 mcg. Mixed nuts - 1 cup (142 g) has 17 mcg. Pear - 1 medium (178 g) has 8 mcg. Peas (cooked from fresh) -  cup (80 g) has 20 mcg. Pickled cucumber - 1 spear (65 g) has 11 mcg. Sauerkraut (canned) -  cup (118 g) has 16 mcg. Soybeans (cooked from fresh) -  cup (86 g) has 16 mcg. Tomato  (raw) - 1 medium (123 g) has 10 mcg. Tomato sauce (raw) -  cup (123 g) has 17 mcg. The items listed above may not be a complete list of foods low in Vitamin K. Actual amounts of Vitamin K may differ depending on processing. Contact a dietitian for more information. What foods do not have vitamin K? If a food contains less than 5 mcg per serving, it is considered to have no vitamin K. These foods include: Bread and cereal products. Cheese. Eggs. Fish and shellfish. Meat and poultry. Milk and dairy products. Seeds, such as sunflower or pumpkin seeds. The items listed above may not be a complete list of foods that do not have vitamin K. Actual amounts of vitamin K may differ depending on processing. Contact a dietitian for more information. Summary Warfarin is an anticoagulant that prevents blood clots from forming or getting bigger by blocking the activity of vitamin K. It is important to know the amount of vitamin K that is in the foods you eat and to keep your intake of vitamin K consistent from day to day. Avoid major changes in your diet. If you are going to change your diet, talk with your health care provider before making changes. This information is not intended to replace advice given to you by your health care provider. Make sure you discuss any questions you have with your health care provider. Document Revised: 07/01/2020 Document Reviewed: 07/01/2020 Elsevier Patient Education  [place].

## 2022-06-29 ENCOUNTER — Ambulatory Visit: Payer: 59 | Admitting: Nurse Practitioner

## 2022-06-29 DIAGNOSIS — Z952 Presence of prosthetic heart valve: Secondary | ICD-10-CM

## 2022-06-29 DIAGNOSIS — Z7901 Long term (current) use of anticoagulants: Secondary | ICD-10-CM

## 2022-06-30 ENCOUNTER — Ambulatory Visit: Payer: 59 | Admitting: Physician Assistant

## 2022-07-11 ENCOUNTER — Other Ambulatory Visit: Payer: Self-pay

## 2022-07-11 DIAGNOSIS — I482 Chronic atrial fibrillation, unspecified: Secondary | ICD-10-CM

## 2022-07-11 LAB — COAGUCHEK XS/INR WAIVED
INR: 4.4 — ABNORMAL HIGH (ref 0.9–1.1)
Prothrombin Time: 53.2 s

## 2022-07-11 NOTE — Progress Notes (Signed)
Spoke to patient on phone.  He will hold coumadin tonight and then reduce to 8 MG until he sees provider Wednesday for recheck.

## 2022-07-13 ENCOUNTER — Ambulatory Visit (INDEPENDENT_AMBULATORY_CARE_PROVIDER_SITE_OTHER): Payer: 59 | Admitting: Nurse Practitioner

## 2022-07-13 ENCOUNTER — Other Ambulatory Visit: Payer: Self-pay | Admitting: Nurse Practitioner

## 2022-07-13 ENCOUNTER — Encounter: Payer: Self-pay | Admitting: Nurse Practitioner

## 2022-07-13 VITALS — BP 134/81 | HR 83 | Temp 98.6°F | Ht 67.99 in | Wt 257.7 lb

## 2022-07-13 DIAGNOSIS — Z7901 Long term (current) use of anticoagulants: Secondary | ICD-10-CM

## 2022-07-13 DIAGNOSIS — Z952 Presence of prosthetic heart valve: Secondary | ICD-10-CM

## 2022-07-13 DIAGNOSIS — E1122 Type 2 diabetes mellitus with diabetic chronic kidney disease: Secondary | ICD-10-CM

## 2022-07-13 DIAGNOSIS — N1831 Chronic kidney disease, stage 3a: Secondary | ICD-10-CM

## 2022-07-13 DIAGNOSIS — R052 Subacute cough: Secondary | ICD-10-CM

## 2022-07-13 DIAGNOSIS — Z794 Long term (current) use of insulin: Secondary | ICD-10-CM

## 2022-07-13 MED ORDER — ALBUTEROL SULFATE HFA 108 (90 BASE) MCG/ACT IN AERS: 2.0000 | INHALATION_SPRAY | Freq: Four times a day (QID) | RESPIRATORY_TRACT | 0 refills | Status: DC | PRN

## 2022-07-13 MED ORDER — AMOXICILLIN-POT CLAVULANATE 875-125 MG PO TABS: 1.0000 | ORAL_TABLET | Freq: Two times a day (BID) | ORAL | 0 refills | Status: AC

## 2022-07-13 NOTE — Progress Notes (Signed)
   BP 134/81   Pulse 83   Temp 98.6 F (37 C) (Oral)   Ht 5' 7.99" (1.727 m)   Wt 257 lb 11.2 oz (116.9 kg)   SpO2 95%   BMI 39.19 kg/m    Subjective:    Patient ID: Rodney Plumber [name], male    DOB: 06-15-1960, 62 y.o.   MRN: [id number]  CC: Coumadin management  HPI: This patient is a 62 y.o. male who presents for coumadin management. The expected duration of coumadin treatment is lifelong. The reason for anticoagulation is  mechanical heart valve. Follows with cardiology, Dr. Saralyn Pilar, last on 01/24/22.  Echo 03/13/2020 with LVEF greater then 55%, stable valve.  He has stopped Fenofibrate due to elevation in INR with this on board in past and risk for interaction.  Has not been feeling good for one week with sinus congestion and cough.  Taking OTC medications + took some Amoxicillin 4 of them recently -- leftovers.  Taking Nyquil.  Present Coumadin dose: Held for past two days due to elevation Goal: 2.5-3.5 The patient does not have an active anticoagulation episode. Excessive bruising: no Nose bleeding: no Rectal bleeding: no Eating diet with consistent amounts of foods containing Vitamin K:no Any recent antibiotic use? yes, as above  Relevant past medical, surgical, family and social history reviewed and updated as indicated. Interim medical history since our last visit reviewed. Allergies and medications reviewed and updated.  ROS: Per HPI unless specifically indicated above     Objective:    BP 134/81   Pulse 83   Temp 98.6 F (37 C) (Oral)   Ht 5' 7.99" (1.727 m)   Wt 257 lb 11.2 oz (116.9 kg)   SpO2 95%   BMI 39.19 kg/m   Wt Readings from Last 3 Encounters:  07/13/22 257 lb 11.2 oz (116.9 kg)  06/23/22 256 lb 11.2 oz (116.4 kg)  05/25/22 257 lb 4.8 oz (116.7 kg)    General: Well appearing, well nourished in no distress.  Normal mood and affect. Skin: No excessive bruising or rash  Last INR: 4.4  Last CBC:  Lab Results  Component Value Date   WBC 9.9  04/11/2022   HGB 16.1 04/11/2022   HCT 50.0 04/11/2022   MCV 87.1 04/11/2022   PLT 191 04/11/2022    Results for orders placed or performed in visit on 07/11/22  CoaguChek XS/INR Waived (STAT)  Result Value Ref Range   INR 4.4 (H) 0.9 - 1.1   Prothrombin Time 53.2 sec    Assessment:     ICD-10-CM   1. History of mechanical aortic valve replacement  Z95.2 CoaguChek XS/INR Waived    2. Current use of long term anticoagulation  Z79.01 CoaguChek XS/INR Waived    3. Type 2 diabetes mellitus with stage 3a chronic kidney disease, with long-term current use of insulin (HCC)  E11.22 AMB Referral to Pharmacy Medication Management   N18.31    Z79.4     4. Subacute cough  R05.2    Present for one week, will sent in 4 more days of Augmentin, took leftover abx at home recently.  Recommend Coricidin and Diabetic Tussin at home -- no Nyquil.      Plan:   Discussed current plan face-to-face with patient. For coumadin dosing, reduce to 8 MG daily due to taking abx therapy.  Will plan to recheck INR in 1 week.

## 2022-07-13 NOTE — Assessment & Plan Note (Signed)
Chronic, ongoing with Coumadin use.  INR today 2 today with recent abx therapy had been elevated and ongoing abx therapy. Remain off Fenofibrate.  Elected to restart 8 MG daily of Coumadin.  Return in 1 week for INR check.  Return sooner to office if any bleeding or concerns.

## 2022-07-13 NOTE — Assessment & Plan Note (Signed)
Refer to mechanical valve plan of care.

## 2022-07-13 NOTE — Patient Instructions (Signed)
Coricidin will not make blood pressure go up and Diabetic Tussin will not increase sugars.  Warfarin Life Changes You will learn about lifestyle changes and precautions required when taking warfarin.  To view the content, go to this web address: [email or web address]  This video will expire on: 04/20/2024. If you need access to this video following this date, please reach out to the healthcare provider who assigned it to you. This information is not intended to replace advice given to you by your health care provider. Make sure you discuss any questions you have with your health care provider. Elsevier Patient Education  [place].

## 2022-07-14 LAB — COAGUCHEK XS/INR WAIVED
INR: 2 — ABNORMAL HIGH (ref 0.9–1.1)
Prothrombin Time: 24 s

## 2022-07-14 NOTE — Telephone Encounter (Signed)
Unable to refill per protocol, Rx request is too soon. Last refill 06/23/22 for 90 days and 4 refills.  Requested Prescriptions  Pending Prescriptions Disp Refills   gabapentin (NEURONTIN) 300 MG capsule [Pharmacy Med Name: GABAPENTIN 300 MG CAPSULE] 900 capsule 4    Sig: TAKE 3 CAPS BY MOUTH IN THE MORNING AND AT LUNCH, AND 4 CAPS AT NIGHT BEFORE BED     Neurology: Anticonvulsants - gabapentin Passed - 07/13/2022  5:09 PM      Passed - Cr in normal range and within 360 days    Creatinine, Ser  Date Value Ref Range Status  04/19/2022 1.24 0.76 - 1.27 mg/dL Final         Passed - Completed PHQ-2 or PHQ-9 in the last 360 days      Passed - Valid encounter within last 12 months    Recent Outpatient Visits           Yesterday History of mechanical aortic valve replacement   [place], [place] T, NP   3 weeks ago Acute leg pain, left   [place] [facility] Mecum, Erin E, PA-C   1 month ago History of mechanical aortic valve replacement   [place], [place] T, NP   2 months ago Type 2 diabetes mellitus with proteinuria ([place])   [place], Henrine Screws T, NP   3 months ago History of mechanical aortic valve replacement   [place], Barbaraann Faster, NP       Future Appointments             In 1 month Cannady, Barbaraann Faster, NP [place], [facility]

## 2022-07-18 ENCOUNTER — Ambulatory Visit: Payer: Self-pay

## 2022-07-18 NOTE — Telephone Encounter (Signed)
  Chief Complaint: cough FU Symptoms: cough with congestion Frequency: almost 2 weeks Pertinent Negatives: Patient denies SOB or fever Disposition: [] ED /[] Urgent Care (no appt availability in office) / [] Appointment(In office/virtual)/ []  [place] [facility]/ [] Home Care/ [] Refused Recommended Disposition /[] [place] [facility]/ [x]  Follow-up with PCP Additional Notes: pt completed abx and felt better for 1 days and cough is back and constant. Pt is asking for something else for cough. Advised him he is also suppose to have appt for this week for DM and INR FU. Pt states he was scheduled for tomorrow 3/12 at 4:20. Advised him he was scheduled for 08/19/22 at 4:20. Pt got upset and thought he has appt, tried to reschedule but first available was 07/21/22 at 0800 and 1120 which pt can't do for being unable to take off work. Advised pt since practice was at lunch I would send message back to [facility] and have her nurse FU with pt.   Summary: Cough   Patient was seen on 3/6 for a cough and patient states he is not getting any better.         Reason for Disposition  [1] Continuous (nonstop) coughing interferes with work or school AND [2] no improvement using cough treatment per Care Advice  Answer Assessment - Initial Assessment Questions 1. ONSET: "When did the cough begin?"      1.5-2 weeks  3. SPUTUM: "Describe the color of your sputum" (none, dry cough; clear, white, yellow, green)     White mucus 5. DIFFICULTY BREATHING: "Are you having difficulty breathing?" If Yes, ask: "How bad is it?" (e.g., mild, moderate, severe)    - MILD: No SOB at rest, mild SOB with walking, speaks normally in sentences, can lie down, no retractions, pulse < 100.    - MODERATE: SOB at rest, SOB with minimal exertion and prefers to sit, cannot lie down flat, speaks in phrases, mild retractions, audible wheezing, pulse 100-120.    - SEVERE: Very SOB at rest, speaks in single words, struggling to breathe, sitting  hunched forward, retractions, pulse > 120      No  6. FEVER: "Do you have a fever?" If Yes, ask: "What is your temperature, how was it measured, and when did it start?"     No  10. OTHER SYMPTOMS: "Do you have any other symptoms?" (e.g., runny nose, wheezing, chest pain)       no  Protocols used: Cough - Acute Productive-A-AH

## 2022-07-19 NOTE — Telephone Encounter (Signed)
Called patient's phone and voicemail was full. Called wife as she is on DPR left message regarding calling office back to make pt an appointment and put in [place].

## 2022-07-20 NOTE — Telephone Encounter (Signed)
Pt called into the [facility] to let them know that he went to UC and he doesn't need an appointment anymore.

## 2022-07-25 ENCOUNTER — Other Ambulatory Visit: Payer: Self-pay | Admitting: Nurse Practitioner

## 2022-07-25 DIAGNOSIS — I152 Hypertension secondary to endocrine disorders: Secondary | ICD-10-CM

## 2022-07-26 NOTE — Telephone Encounter (Signed)
Unable to refill per protocol, Rx request is too soon. Last refill 07/14/21 for 90 and 4 refills.  Requested Prescriptions  Pending Prescriptions Disp Refills   benazepril (LOTENSIN) 40 MG tablet [Pharmacy Med Name: BENAZEPRIL HCL 40 MG TABLET] 90 tablet 4    Sig: TAKE 1 TABLET BY MOUTH EVERY DAY     Cardiovascular:  ACE Inhibitors Passed - 07/25/2022  6:25 AM      Passed - Cr in normal range and within 180 days    Creatinine, Ser  Date Value Ref Range Status  04/19/2022 1.24 0.76 - 1.27 mg/dL Final         Passed - K in normal range and within 180 days    Potassium  Date Value Ref Range Status  04/19/2022 4.0 3.5 - 5.2 mmol/L Final         Passed - Patient is not pregnant      Passed - Last BP in normal range    BP Readings from Last 1 Encounters:  07/13/22 134/81         Passed - Valid encounter within last 6 months    Recent Outpatient Visits           1 week ago History of mechanical aortic valve replacement   [place], Henrine Screws T, NP   1 month ago Acute leg pain, left   [place] [facility] Mecum, Erin E, PA-C   2 months ago History of mechanical aortic valve replacement   [place], [place] T, NP   3 months ago Type 2 diabetes mellitus with proteinuria ([place])   [place], [place] T, NP   4 months ago History of mechanical aortic valve replacement   [place], Barbaraann Faster, NP       Future Appointments             In 3 weeks Cannady, Barbaraann Faster, NP [place], [facility]

## 2022-07-29 ENCOUNTER — Other Ambulatory Visit: Payer: Self-pay | Admitting: Nurse Practitioner

## 2022-08-01 NOTE — Telephone Encounter (Signed)
Requested Prescriptions  Refused Prescriptions Disp Refills   gabapentin (NEURONTIN) 300 MG capsule [Pharmacy Med Name: GABAPENTIN 300 MG CAPSULE] 900 capsule 4    Sig: TAKE 3 CAPS BY MOUTH IN THE MORNING AND AT LUNCH, AND 4 CAPS AT NIGHT BEFORE BED     Neurology: Anticonvulsants - gabapentin Passed - 07/29/2022  4:29 PM      Passed - Cr in normal range and within 360 days    Creatinine, Ser  Date Value Ref Range Status  04/19/2022 1.24 0.76 - 1.27 mg/dL Final         Passed - Completed PHQ-2 or PHQ-9 in the last 360 days      Passed - Valid encounter within last 12 months    Recent Outpatient Visits           2 weeks ago History of mechanical aortic valve replacement   [place], Henrine Screws T, NP   1 month ago Acute leg pain, left   [place] [facility] Mecum, Erin E, PA-C   2 months ago History of mechanical aortic valve replacement   [place], [place] T, NP   3 months ago Type 2 diabetes mellitus with proteinuria ([place])   [place], [place] T, NP   4 months ago History of mechanical aortic valve replacement   [place], Barbaraann Faster, NP       Future Appointments             In 2 weeks Cannady, Barbaraann Faster, NP [place], [facility]

## 2022-08-10 ENCOUNTER — Other Ambulatory Visit: Payer: Self-pay | Admitting: Nurse Practitioner

## 2022-08-10 NOTE — Telephone Encounter (Signed)
Requested Prescriptions  Pending Prescriptions Disp Refills   albuterol (VENTOLIN HFA) 108 (90 Base) MCG/ACT inhaler [Pharmacy Med Name: ALBUTEROL HFA (VENTOLIN) INH] 18 each 0    Sig: TAKE 2 PUFFS BY MOUTH EVERY 6 HOURS AS NEEDED     Pulmonology:  Beta Agonists 2 Passed - 08/10/2022  2:14 AM      Passed - Last BP in normal range    BP Readings from Last 1 Encounters:  07/13/22 134/81         Passed - Last Heart Rate in normal range    Pulse Readings from Last 1 Encounters:  07/13/22 83         Passed - Valid encounter within last 12 months    Recent Outpatient Visits           4 weeks ago History of mechanical aortic valve replacement   [place], [place] T, NP   1 month ago Acute leg pain, left   [place] [facility] Mecum, Erin E, PA-C   2 months ago History of mechanical aortic valve replacement   [place], [place] T, NP   3 months ago Type 2 diabetes mellitus with proteinuria ([place])   [place], [place] T, NP   4 months ago History of mechanical aortic valve replacement   [place], Barbaraann Faster, NP       Future Appointments             In 1 week Cannady, Barbaraann Faster, NP [place], [facility]

## 2022-08-14 NOTE — Patient Instructions (Signed)
Diabetes Mellitus Basics  Diabetes mellitus, or diabetes, is a long-term (chronic) disease. It occurs when the body does not properly use sugar (glucose) that is released from food after you eat. Diabetes mellitus may be caused by one or both of these problems: Your pancreas does not make enough of a hormone called insulin. Your body does not react in a normal way to the insulin that it makes. Insulin lets glucose enter cells in your body. This gives you energy. If you have diabetes, glucose cannot get into cells. This causes high blood glucose (hyperglycemia). How to treat and manage diabetes You may need to take insulin or other diabetes medicines daily to keep your glucose in balance. If you are prescribed insulin, you will learn how to give yourself insulin by injection. You may need to adjust the amount of insulin you take based on the foods that you eat. You will need to check your blood glucose levels using a glucose monitor as told by your health care provider. The readings can help determine if you have low or high blood glucose. Generally, you should have these blood glucose levels: Before meals (preprandial): 80-130 mg/dL (4.4-7.2 mmol/L). After meals (postprandial): below 180 mg/dL (10 mmol/L). Hemoglobin A1c (HbA1c) level: less than 7%. Your health care provider will set treatment goals for you. Keep all follow-up visits. This is important. Follow these instructions at home: Diabetes medicines Take your diabetes medicines every day as told by your health care provider. List your diabetes medicines here: Name of medicine: ______________________________ Amount (dose): _______________ Time (a.m./p.m.): _______________ Notes: ___________________________________ Name of medicine: ______________________________ Amount (dose): _______________ Time (a.m./p.m.): _______________ Notes: ___________________________________ Name of medicine: ______________________________ Amount (dose):  _______________ Time (a.m./p.m.): _______________ Notes: ___________________________________ Insulin If you use insulin, list the types of insulin you use here: Insulin type: ______________________________ Amount (dose): _______________ Time (a.m./p.m.): _______________Notes: ___________________________________ Insulin type: ______________________________ Amount (dose): _______________ Time (a.m./p.m.): _______________ Notes: ___________________________________ Insulin type: ______________________________ Amount (dose): _______________ Time (a.m./p.m.): _______________ Notes: ___________________________________ Insulin type: ______________________________ Amount (dose): _______________ Time (a.m./p.m.): _______________ Notes: ___________________________________ Insulin type: ______________________________ Amount (dose): _______________ Time (a.m./p.m.): _______________ Notes: ___________________________________ Managing blood glucose  Check your blood glucose levels using a glucose monitor as told by your health care provider. Write down the times that you check your glucose levels here: Time: _______________ Notes: ___________________________________ Time: _______________ Notes: ___________________________________ Time: _______________ Notes: ___________________________________ Time: _______________ Notes: ___________________________________ Time: _______________ Notes: ___________________________________ Time: _______________ Notes: ___________________________________  Low blood glucose Low blood glucose (hypoglycemia) is when glucose is at or below 70 mg/dL (3.9 mmol/L). Symptoms may include: Feeling: Hungry. Sweaty and clammy. Irritable or easily upset. Dizzy. Sleepy. Having: A fast heartbeat. A headache. A change in your vision. Numbness around the mouth, lips, or tongue. Having trouble with: Moving (coordination). Sleeping. Treating low blood glucose To treat low blood  glucose, eat or drink something containing sugar right away. If you can think clearly and swallow safely, follow the 15:15 rule: Take 15 grams of a fast-acting carb (carbohydrate), as told by your health care provider. Some fast-acting carbs are: Glucose tablets: take 3-4 tablets. Hard candy: eat 3-5 pieces. Fruit juice: drink 4 oz (120 mL). Regular (not diet) soda: drink 4-6 oz (120-180 mL). Honey or sugar: eat 1 Tbsp (15 mL). Check your blood glucose levels 15 minutes after you take the carb. If your glucose is still at or below 70 mg/dL (3.9 mmol/L), take 15 grams of a carb again. If your glucose does not go above 70 mg/dL (3.9 mmol/L) after   3 tries, get help right away. After your glucose goes back to normal, eat a meal or a snack within 1 hour. Treating very low blood glucose If your glucose is at or below 54 mg/dL (3 mmol/L), you have very low blood glucose (severe hypoglycemia). This is an emergency. Do not wait to see if the symptoms will go away. Get medical help right away. Call your local emergency services (911 in the [place] Do not drive yourself to the hospital. Questions to ask your health care provider Should I talk with a diabetes educator? What equipment will I need to care for myself at home? What diabetes medicines do I need? When should I take them? How often do I need to check my blood glucose levels? What number can I call if I have questions? When is my follow-up visit? Where can I find a support group for people with diabetes? Where to find more information American Diabetes Association: www.diabetes.org Association of Diabetes Care and Education Specialists: www.diabeteseducator.org Contact a health care provider if: Your blood glucose is at or above 240 mg/dL (13.3 mmol/L) for 2 days in a row. You have been sick or have had a fever for 2 days or more, and you are not getting better. You have any of these problems for more than 6 hours: You cannot eat or  drink. You feel nauseous. You vomit. You have diarrhea. Get help right away if: Your blood glucose is lower than 54 mg/dL (3 mmol/L). You get confused. You have trouble thinking clearly. You have trouble breathing. These symptoms may represent a serious problem that is an emergency. Do not wait to see if the symptoms will go away. Get medical help right away. Call your local emergency services (911 in the [place] Do not drive yourself to the hospital. Summary Diabetes mellitus is a chronic disease that occurs when the body does not properly use sugar (glucose) that is released from food after you eat. Take insulin and diabetes medicines as told. Check your blood glucose every day, as often as told. Keep all follow-up visits. This is important. This information is not intended to replace advice given to you by your health care provider. Make sure you discuss any questions you have with your health care provider. Document Revised: 08/27/2019 Document Reviewed: 08/27/2019 Elsevier Patient Education  2023 Elsevier Inc.  

## 2022-08-15 ENCOUNTER — Other Ambulatory Visit: Payer: Self-pay | Admitting: Nurse Practitioner

## 2022-08-15 DIAGNOSIS — E1159 Type 2 diabetes mellitus with other circulatory complications: Secondary | ICD-10-CM

## 2022-08-16 NOTE — Telephone Encounter (Signed)
Expired Rx- 07/14/21 #90 4RF- covers 15 months- so will RF Requested Prescriptions  Pending Prescriptions Disp Refills   benazepril (LOTENSIN) 40 MG tablet [Pharmacy Med Name: BENAZEPRIL HCL 40 MG TABLET] 90 tablet 4    Sig: TAKE 1 TABLET BY MOUTH EVERY DAY     Cardiovascular:  ACE Inhibitors Passed - 08/15/2022  4:11 PM      Passed - Cr in normal range and within 180 days    Creatinine, Ser  Date Value Ref Range Status  04/19/2022 1.24 0.76 - 1.27 mg/dL Final         Passed - K in normal range and within 180 days    Potassium  Date Value Ref Range Status  04/19/2022 4.0 3.5 - 5.2 mmol/L Final         Passed - Patient is not pregnant      Passed - Last BP in normal range    BP Readings from Last 1 Encounters:  07/13/22 134/81         Passed - Valid encounter within last 6 months    Recent Outpatient Visits           1 month ago History of mechanical aortic valve replacement   [place] [facility] [place], Corrie Dandy T, NP   1 month ago Acute leg pain, left   [place] [facility] [place], Erin E, PA-C   2 months ago History of mechanical aortic valve replacement   [facility] [place], [place] T, NP   3 months ago Type 2 diabetes mellitus with proteinuria (HCC)   [facility] [place], Hoyt T, NP   4 months ago History of mechanical aortic valve replacement   [place] [facility] [place], [name], NP       Future Appointments             In 3 days [place], [name], NP [facility]

## 2022-08-19 ENCOUNTER — Ambulatory Visit (INDEPENDENT_AMBULATORY_CARE_PROVIDER_SITE_OTHER): Payer: Self-pay | Admitting: Nurse Practitioner

## 2022-08-19 ENCOUNTER — Encounter: Payer: Self-pay | Admitting: Nurse Practitioner

## 2022-08-19 VITALS — BP 142/80 | HR 80 | Temp 97.9°F | Ht 67.99 in | Wt 258.1 lb

## 2022-08-19 DIAGNOSIS — N1831 Chronic kidney disease, stage 3a: Secondary | ICD-10-CM

## 2022-08-19 DIAGNOSIS — E1129 Type 2 diabetes mellitus with other diabetic kidney complication: Secondary | ICD-10-CM

## 2022-08-19 DIAGNOSIS — E1142 Type 2 diabetes mellitus with diabetic polyneuropathy: Secondary | ICD-10-CM

## 2022-08-19 DIAGNOSIS — E785 Hyperlipidemia, unspecified: Secondary | ICD-10-CM

## 2022-08-19 DIAGNOSIS — I152 Hypertension secondary to endocrine disorders: Secondary | ICD-10-CM

## 2022-08-19 DIAGNOSIS — I482 Chronic atrial fibrillation, unspecified: Secondary | ICD-10-CM

## 2022-08-19 DIAGNOSIS — Z794 Long term (current) use of insulin: Secondary | ICD-10-CM

## 2022-08-19 DIAGNOSIS — R809 Proteinuria, unspecified: Secondary | ICD-10-CM

## 2022-08-19 DIAGNOSIS — Z8673 Personal history of transient ischemic attack (TIA), and cerebral infarction without residual deficits: Secondary | ICD-10-CM

## 2022-08-19 DIAGNOSIS — Z952 Presence of prosthetic heart valve: Secondary | ICD-10-CM

## 2022-08-19 DIAGNOSIS — Z91148 Patient's other noncompliance with medication regimen for other reason: Secondary | ICD-10-CM

## 2022-08-19 DIAGNOSIS — D6869 Other thrombophilia: Secondary | ICD-10-CM

## 2022-08-19 DIAGNOSIS — E1122 Type 2 diabetes mellitus with diabetic chronic kidney disease: Secondary | ICD-10-CM

## 2022-08-19 DIAGNOSIS — Z7901 Long term (current) use of anticoagulants: Secondary | ICD-10-CM

## 2022-08-19 DIAGNOSIS — E1159 Type 2 diabetes mellitus with other circulatory complications: Secondary | ICD-10-CM

## 2022-08-19 DIAGNOSIS — E1169 Type 2 diabetes mellitus with other specified complication: Secondary | ICD-10-CM

## 2022-08-19 MED ORDER — DAPAGLIFLOZIN PROPANEDIOL 10 MG PO TABS: 10.0000 mg | ORAL_TABLET | Freq: Every day | ORAL | 4 refills | Status: DC

## 2022-08-19 MED ORDER — GABAPENTIN 600 MG PO TABS: ORAL_TABLET | ORAL | 4 refills | Status: DC

## 2022-08-19 MED ORDER — BENAZEPRIL HCL 40 MG PO TABS: 40.0000 mg | ORAL_TABLET | Freq: Every day | ORAL | 4 refills | Status: DC

## 2022-08-19 MED ORDER — TAMSULOSIN HCL 0.4 MG PO CAPS: ORAL_CAPSULE | ORAL | 4 refills | Status: DC

## 2022-08-19 NOTE — Assessment & Plan Note (Signed)
Chronic, ongoing.  Continue current medication regimen and adjust as needed. Lipid panel today. 

## 2022-08-19 NOTE — Assessment & Plan Note (Signed)
Chronic, ongoing.  Refer to diabetes plan of care for further. 

## 2022-08-19 NOTE — Progress Notes (Signed)
BP (!) 142/80 (BP Location: Left Arm, Patient Position: Sitting, Cuff Size: Large)   Pulse 80   Temp 97.9 F (36.6 C) (Oral)   Ht 5' 7.99" (1.727 m)   Wt 258 lb 1.6 oz (117.1 kg)   SpO2 95%   BMI 39.25 kg/m    Subjective:    Patient ID: Rodney Chary [name], male    DOB: 1960/10/28, 62 y.o.   MRN: [id number]  HPI: Rodney Dixon. is a 62 y.o. male  Chief Complaint  Patient presents with   INR check   Diabetes   DIABETES A1c 7.9% December 2023.  Endocrinology visit last Dr. Everardo All 01/26/21 and to be taking current medications: Marcelline Deist (not taking - he reports this has not been filled recently), NPH, Trulicity 1.5 MG weekly - not taking (changed from Trulicity due to shortage of Ozempic, but he prefers to take Ozempic and would like to return to this).  Has not returned to endo, missed December 2023 visit.  Have discussed with him at all recent visits to reschedule, which he has not.  On visit 08/27/21 placed referral to Dr. Patrecia Pace locally, as patient reported it would be easier to have provider here and [facility] discharged him in past -- he has not scheduled with them.  He has endorsed being an "eater" + enjoying sweets.  Currently is self pay without insurance coverage and not taking some of his ordered medications due to this.   Continues on Gabapentin -- taking 900 MG in the morning, 900 MG at lunch, and then 1200 MG before bed.   Hypoglycemic episodes: no Polydipsia/polyuria: no Visual disturbance: no Chest pain: no Paresthesias: no Glucose Monitoring: yes  Accucheck frequency: occasional  Fasting glucose:   Post prandial:  Evening:  Before meals: Taking Insulin?: yes NPH 50 units  Long acting insulin:  Short acting insulin:  Blood Pressure Monitoring: not checking Retinal Examination: Not up to Date Foot Exam: Up to Date Pneumovax: Up to Date Influenza: Up to Date Aspirin: no   HYPERTENSION / HYPERLIPIDEMIA Cardiology last 01/24/22 -- they continue to  recommend sleep study, he has not obtained.  He is to continue Benazepril (not taking - reports they did not fill recently), HCTZ, Amlodipine, and Carvedilol. Continues on Atorvastatin, stopped Fenofibrate in past due to interaction with Coumadin. Last echo noted normal LV function and EF >55% 03/13/2020. Satisfied with current treatment? yes Duration of hypertension: chronic BP monitoring frequency: not checking BP range:  BP medication side effects: no Duration of hyperlipidemia: chronic Cholesterol medication side effects: no Cholesterol supplements: none Medication compliance: good compliance Aspirin: no Recent stressors: no Recurrent headaches: no Visual changes: no Palpitations: no Dyspnea: no Chest pain: no Lower extremity edema: no Dizzy/lightheaded: yes  CHRONIC KIDNEY DISEASE Remains stable on recent labs. CKD status: stable Medications renally dose: yes Previous renal evaluation: no Pneumovax:  Up to Date Influenza Vaccine:  Up to Date   ATRIAL FIBRILLATION Currently takes 8 MG daily with recent INR 2.  Not able to get INR in office today and he agrees to return next week for lab only visit.  Saw neurology last on 03/30/21 for history of stroke.  Has dizziness ongoing on occasion -- recent CT remains reassuring on 05/12/21 and has not returned to neurology as recommended.  Atrial fibrillation status: stable Satisfied with current treatment: yes  Medication side effects:  no Medication compliance: good compliance Etiology of atrial fibrillation: unknown Palpitations:  no Chest pain:  no Dyspnea on  exertion:  no Orthopnea:  no Syncope:  no Edema:  no Ventricular rate control: B-blocker Anti-coagulation: warfarin   Relevant past medical, surgical, family and social history reviewed and updated as indicated. Interim medical history since our last visit reviewed. Allergies and medications reviewed and updated.  Review of Systems  Constitutional:  Negative for  activity change, diaphoresis, fatigue and fever.  Respiratory:  Negative for cough, chest tightness, shortness of breath and wheezing.   Cardiovascular:  Negative for chest pain, palpitations and leg swelling.  Gastrointestinal: Negative.   Endocrine: Negative for polydipsia, polyphagia and polyuria.  Genitourinary:  Negative for urgency.  Neurological:  Negative for dizziness, facial asymmetry, speech difficulty, weakness and headaches.  Psychiatric/Behavioral: Negative.      Per HPI unless specifically indicated above     Objective:    BP (!) 142/80 (BP Location: Left Arm, Patient Position: Sitting, Cuff Size: Large)   Pulse 80   Temp 97.9 F (36.6 C) (Oral)   Ht 5' 7.99" (1.727 m)   Wt 258 lb 1.6 oz (117.1 kg)   SpO2 95%   BMI 39.25 kg/m   Wt Readings from Last 3 Encounters:  08/19/22 258 lb 1.6 oz (117.1 kg)  07/13/22 257 lb 11.2 oz (116.9 kg)  06/23/22 256 lb 11.2 oz (116.4 kg)    Physical Exam Vitals and nursing note reviewed.  Constitutional:      General: He is awake. He is not in acute distress.    Appearance: He is well-developed and well-groomed. He is morbidly obese. He is not ill-appearing.  HENT:     Head: Normocephalic and atraumatic.     Right Ear: Hearing normal. No drainage.     Left Ear: Hearing normal. No drainage.  Eyes:     General: Lids are normal.        Right eye: No discharge.        Left eye: No discharge.     Conjunctiva/sclera: Conjunctivae normal.     Pupils: Pupils are equal, round, and reactive to light.  Neck:     Thyroid: No thyromegaly.     Vascular: No carotid bruit or JVD.  Cardiovascular:     Rate and Rhythm: Normal rate and regular rhythm.     Heart sounds: Normal heart sounds, S1 normal and S2 normal. No murmur heard.    No gallop.  Pulmonary:     Effort: Pulmonary effort is normal. No accessory muscle usage or respiratory distress.     Breath sounds: Normal breath sounds.  Abdominal:     General: Bowel sounds are normal.      Palpations: Abdomen is soft.  Musculoskeletal:        General: Normal range of motion.     Right shoulder: Normal.     Left shoulder: Normal.     Cervical back: Normal range of motion and neck supple.     Right lower leg: No edema.     Left lower leg: No edema.  Skin:    General: Skin is warm and dry.     Capillary Refill: Capillary refill takes less than 2 seconds.     Findings: No rash.  Neurological:     Mental Status: He is alert and oriented to person, place, and time.     Cranial Nerves: Cranial nerves 2-12 are intact.     Motor: Motor function is intact.     Coordination: Coordination is intact.     Gait: Gait is intact.     Deep  Tendon Reflexes: Reflexes are normal and symmetric.  Psychiatric:        Attention and Perception: Attention normal.        Mood and Affect: Mood normal.        Speech: Speech normal.        Behavior: Behavior normal. Behavior is cooperative.        Thought Content: Thought content normal.    Results for orders placed or performed in visit on 07/13/22  CoaguChek XS/INR Waived  Result Value Ref Range   INR 2.0 (H) 0.9 - 1.1   Prothrombin Time 24.0 sec      Assessment & Plan:   Problem List Items Addressed This Visit       Cardiovascular and Mediastinum   Chronic atrial fibrillation (Chronic)    Chronic, ongoing with Coumadin use.  INR last visit 3.4, at goal. Continue collaboration with cardiology, recent note reviewed.  Continue all medications.  Plan on recheck INR outpatient next week, as not equipment not available today.      Relevant Medications   benazepril (LOTENSIN) 40 MG tablet   Other Relevant Orders   Comprehensive metabolic panel   Lipid Panel w/o Chol/HDL Ratio   Hypertension associated with type 2 diabetes mellitus (Chronic)    Chronic, ongoing with BP above goal today as has been without Benazepril.  Sent in refills on this and recommend he restart. Educated him on diabetes effect on vascular system.  Continue  Benazepril, HCTZ, Coreg + Amlodipine, educated him on this medication and use + side effects.  Highly recommend he monitor BP at home at least a few mornings a week + focus on DASH diet.  Discussed with him stroke prevention goal for BP <130/90.  LABS: CMP.  Continue cardiology collaboration.  Return in 4 weeks for BP check.  Referral to pharmacist placed for med assistance while without insurance.      Relevant Medications   benazepril (LOTENSIN) 40 MG tablet   dapagliflozin propanediol (FARXIGA) 10 MG TABS tablet   Semaglutide,0.25 or 0.5MG /DOS, (OZEMPIC, 0.25 OR 0.5 MG/DOSE,) 2 MG/1.5ML SOPN   Other Relevant Orders   Bayer DCA Hb A1c Waived   Microalbumin, Urine Waived   Comprehensive metabolic panel   Amb Referral to Clinical Pharmacist     Endocrine   Hyperlipidemia associated with type 2 diabetes mellitus (Chronic)    Chronic, ongoing.  Continue current medication regimen and adjust as needed.  Lipid panel today.      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  Urine ALB 30 February 2023, recheck today.  Not adherent with diet. - Continue NPH with increase to 55 units, provided him sample of Ozempic in office today x 2 and recommend he use 0.5 MG weekly dosing.  Referral to pharmacist placed for med assistance while without insurance, as needs higher dose of Ozempic and Farxiga back on board - can not afford OOP. - Continue Benazepril  for  kidney protection.   - Focus heavily on diabetic diet and modest weight loss, decreasing carb and sugar intake at home -- have discussed at length at visits.   - Up to date foot exam, not up to date eye exam -- recommend he obtain. - Would benefit from [facility] - Recommend he check BS 3-4 times a day and document for next visit for review.  Will continue to work with CCM team to ensure adherence to medication regimen and diet, which is a major issue.   Discussed at length his A1C goal for stroke prevention being 6.5% or less.  - Return in 4 weeks.      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  Continue Gabapentin and renal dose as needed in future.      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      Relevant Medications   benazepril (LOTENSIN) 40 MG tablet   dapagliflozin propanediol (FARXIGA) 10 MG TABS tablet   Semaglutide,0.25 or 0.5MG /DOS, (OZEMPIC, 0.25 OR 0.5 MG/DOSE,) 2 MG/1.5ML SOPN   Other Relevant Orders   Bayer DCA Hb A1c Waived   Microalbumin, Urine Waived   Amb Referral to Clinical Pharmacist     Genitourinary   CKD (chronic kidney disease) stage 3, GFR 30-59 ml/min  (Chronic)    Chronic, ongoing with T2DM.  Continue Benazepril for kidney protection, urine ALB 30 (February 2023) - recheck today.  Discussed with patient dependent on labs may need to renally dose Gabapentin.  Recommend heavy focus on controlling his diabetes to prevent worsening CKD and possible need for dialysis in future.  CMP today.      Relevant Orders   Microalbumin, Urine Waived   Comprehensive metabolic panel     Hematopoietic and Hemostatic   Acquired thrombophilia (Chronic)    Monitor CBC with use of daily Coumadin for mechanical valve and a-fib.  Denies any current bleeding episodes.  Check CBC annually.        Other   Current use of long term anticoagulation (Chronic)    Refer to mechanical valve plan of care.      Relevant Orders   CoaguChek XS/INR Waived   History of mechanical aortic valve replacement (Chronic)    Chronic, ongoing with Coumadin use.  INR recheck outpatient next week and adjust Coumadin as needed. Remain off Fenofibrate. Elected to continue 8 MG daily of Coumadin until INR check.  Return sooner to office if any bleeding or concerns.      Relevant Orders   CoaguChek XS/INR Waived   History of stroke (Chronic)    Discussed at length stroke prevention goals BP <130/90,  A1c <6.5%, and LDL <70.  He continues to have poor diabetes control, which have discussed at length with him -- much is related to poor diet choices.      Relevant Orders   Comprehensive metabolic panel   Lipid Panel w/o Chol/HDL Ratio   Long-term insulin use (Chronic)    Chronic, ongoing.  Refer to diabetes plan of care for further.      Relevant Orders   Bayer DCA Hb A1c Waived   Morbid obesity (Chronic)    BMI 39.25 with T2DM, HTN/HLD.  Recommended eating smaller high protein, low fat meals more frequently and exercising 30 mins a day 5 times a week with a goal of 10-15lb weight loss in the next 3 months. Patient voiced their understanding and motivation to adhere to these  recommendations.       Relevant Medications   dapagliflozin propanediol (FARXIGA) 10 MG TABS tablet   Semaglutide,0.25 or 0.5MG /DOS, (OZEMPIC, 0.25 OR 0.5 MG/DOSE,) 2 MG/1.5ML SOPN   Noncompliance w/medication treatment due to intermit use of medication (Chronic)    Major concern and reiterated to patient necessity of adhering to medication and diet regimen as he is at high risk for recurrent cardiac event with current co morbidities.  Referral to pharmacist placed for med assistance while without insurance.      Relevant Orders   Amb Referral to Clinical Pharmacist     Follow up plan: Return in about 4 weeks (around 09/16/2022) for INR CHECK -- also needs lab only visit next week for INR.

## 2022-08-19 NOTE — Assessment & Plan Note (Signed)
Discussed at length stroke prevention goals BP <130/90, A1c <6.5%, and LDL <70.  He continues to have poor diabetes control, which have discussed at length with him -- much is related to poor diet choices. 

## 2022-08-19 NOTE — Assessment & Plan Note (Signed)
Monitor CBC with use of daily Coumadin for mechanical valve and a-fib.  Denies any current bleeding episodes.  Check CBC annually. 

## 2022-08-19 NOTE — Assessment & Plan Note (Signed)
Refer to diabetes with CKD plan of care.  Continue Gabapentin and renal dose as needed in future. 

## 2022-08-19 NOTE — Assessment & Plan Note (Signed)
Major concern and reiterated to patient necessity of adhering to medication and diet regimen as he is at high risk for recurrent cardiac event with current co morbidities.  Referral to pharmacist placed for med assistance while without insurance.

## 2022-08-19 NOTE — Assessment & Plan Note (Signed)
Chronic, ongoing with A1c trend up today due to using NPH only without insurance at this time = 11.7% today -- has not scheduled with endo as recommended, not always compliant with regimen and recommendations.  Urine ALB 30 February 2023, recheck today.  Not adherent with diet. - Continue NPH with increase to 55 units, provided him sample of Ozempic in office today x 2 and recommend he use 0.5 MG weekly dosing.  Referral to pharmacist placed for med assistance while without insurance, as needs higher dose of Ozempic and Farxiga back on board - can not afford OOP. - Continue Benazepril for kidney protection.   - Focus heavily on diabetic diet and modest weight loss, decreasing carb and sugar intake at home -- have discussed at length at visits.   - Up to date foot exam, not up to date eye exam -- recommend he obtain. - Would benefit from [employer] [facility] - Recommend he check BS 3-4 times a day and document for next visit for review.  Will continue to work with CCM team to ensure adherence to medication regimen and diet, which is a major issue.   Discussed at length his A1C goal for stroke prevention being 6.5% or less.  - Return in 4 weeks.

## 2022-08-19 NOTE — Assessment & Plan Note (Signed)
Chronic, ongoing with Coumadin use.  INR recheck outpatient next week and adjust Coumadin as needed. Remain off Fenofibrate. Elected to continue 8 MG daily of Coumadin until INR check.  Return sooner to office if any bleeding or concerns.

## 2022-08-19 NOTE — Assessment & Plan Note (Signed)
Chronic, ongoing with BP above goal today as has been without Benazepril.  Sent in refills on this and recommend he restart. Educated him on diabetes effect on vascular system.  Continue Benazepril, HCTZ, Coreg + Amlodipine, educated him on this medication and use + side effects.  Highly recommend he monitor BP at home at least a few mornings a week + focus on DASH diet.  Discussed with him stroke prevention goal for BP <130/90.  LABS: CMP.  Continue cardiology collaboration.  Return in 4 weeks for BP check.  Referral to pharmacist placed for med assistance while without insurance.

## 2022-08-19 NOTE — Assessment & Plan Note (Signed)
Refer to diabetes with CKD plan of care. °

## 2022-08-19 NOTE — Assessment & Plan Note (Signed)
BMI 39.25 with T2DM, HTN/HLD.  Recommended eating smaller high protein, low fat meals more frequently and exercising 30 mins a day 5 times a week with a goal of 10-15lb weight loss in the next 3 months. Patient voiced their understanding and motivation to adhere to these recommendations.

## 2022-08-19 NOTE — Assessment & Plan Note (Signed)
Chronic, ongoing with Coumadin use.  INR last visit 3.4, at goal. Continue collaboration with cardiology, recent note reviewed.  Continue all medications.  Plan on recheck INR outpatient next week, as not equipment not available today.

## 2022-08-19 NOTE — Assessment & Plan Note (Signed)
Refer to mechanical valve plan of care. ?

## 2022-08-19 NOTE — Assessment & Plan Note (Signed)
Chronic, ongoing with T2DM.  Continue Benazepril for kidney protection, urine ALB 30 (February 2023) - recheck today.  Discussed with patient dependent on labs may need to renally dose Gabapentin.  Recommend heavy focus on controlling his diabetes to prevent worsening CKD and possible need for dialysis in future.  CMP today.

## 2022-08-20 LAB — COMPREHENSIVE METABOLIC PANEL
ALT: 34 IU/L (ref 0–44)
AST: 24 IU/L (ref 0–40)
Albumin/Globulin Ratio: 1.7 (ref 1.2–2.2)
Albumin: 4.3 g/dL (ref 3.9–4.9)
Alkaline Phosphatase: 108 IU/L (ref 44–121)
BUN/Creatinine Ratio: 19 (ref 10–24)
BUN: 23 mg/dL (ref 8–27)
Bilirubin Total: 0.4 mg/dL (ref 0.0–1.2)
CO2: 20 mmol/L (ref 20–29)
Calcium: 9.2 mg/dL (ref 8.6–10.2)
Chloride: 98 mmol/L (ref 96–106)
Creatinine, Ser: 1.24 mg/dL (ref 0.76–1.27)
Globulin, Total: 2.6 g/dL (ref 1.5–4.5)
Glucose: 300 mg/dL — ABNORMAL HIGH (ref 70–99)
Potassium: 3.9 mmol/L (ref 3.5–5.2)
Sodium: 137 mmol/L (ref 134–144)
Total Protein: 6.9 g/dL (ref 6.0–8.5)
eGFR: 66 mL/min/{1.73_m2} (ref >59)

## 2022-08-20 LAB — MICROALBUMIN, URINE WAIVED
Creatinine, Urine Waived: 50 mg/dL (ref 10–300)
Microalb, Ur Waived: 150 mg/L — ABNORMAL HIGH (ref 0–19)
Microalb/Creat Ratio: >300 mg/g — ABNORMAL HIGH (ref <30)

## 2022-08-20 LAB — LIPID PANEL W/O CHOL/HDL RATIO
Cholesterol, Total: 141 mg/dL (ref 100–199)
HDL: 28 mg/dL — ABNORMAL LOW (ref >39)
LDL Chol Calc (NIH): 36 mg/dL (ref 0–99)
Triglycerides: 552 mg/dL (ref 0–149)
VLDL Cholesterol Cal: 77 mg/dL — ABNORMAL HIGH (ref 5–40)

## 2022-08-20 LAB — BAYER DCA HB A1C WAIVED: HB A1C (BAYER DCA - WAIVED): 11.7 % — ABNORMAL HIGH (ref 4.8–5.6)

## 2022-08-20 NOTE — Progress Notes (Signed)
Good morning, please let Ayvin know his labs have returned: - Kidney and liver function are normal in range. - Glucose is elevated as expected at 300, please ensure you are using the Ozempic samples I gave you and taking your NPH.  Let us know if you do not hear from pharmacist from [facility] this week. - Cholesterol levels show stable LDL, but triglycerides are elevated.  I suspect this is related to you not fasting at visit.  We will recheck next visit.  Continue medications and work heavily on [employer].  Any questions? Keep being amazing!!  Thank you for allowing me to participate in your care.  I appreciate you. Kindest regards, Cree Kunert

## 2022-08-25 ENCOUNTER — Other Ambulatory Visit: Payer: Self-pay

## 2022-08-25 DIAGNOSIS — I482 Chronic atrial fibrillation, unspecified: Secondary | ICD-10-CM

## 2022-08-26 LAB — PROTIME-INR
INR: 2 — ABNORMAL HIGH (ref 0.9–1.2)
Prothrombin Time: 21 s — ABNORMAL HIGH (ref 9.1–12.0)

## 2022-08-26 NOTE — Progress Notes (Signed)
Good afternoon, please let Rodney Dixon know his A1c is not on target.  It is 2.  We need between 2.5 and 3.5.  I want to adjust his Coumadin to taking 8 MG 4 days a week and then 8.5 MG 3 days a week -- he can determine the days he wants to add on the extra 1/2.  Any questions?  Want to see him in office in 2 weeks for INR recheck please. Keep being awesome!!  Thank you for allowing me to participate in your care.  I appreciate you. Kindest regards, Gustine Antolin

## 2022-08-29 ENCOUNTER — Telehealth: Payer: Self-pay | Admitting: *Deleted

## 2022-08-29 NOTE — Telephone Encounter (Signed)
  Chief Complaint: Results Symptoms: NA Frequency: NA Pertinent Negatives: Patient denies NA Disposition: ED /[] Urgent Care (no appt availability in office) / Appointment(In office/virtual)/  [facility]/ Home Care/ Refused Recommended Disposition /[] [facility]/  Follow-up with PCP Additional Notes:  Pt called for lab results. Advised typo regarding A1C, meaning INR.   Pt verbalizes understanding. Offered lab appt in 2 weeks as advised, pt states has appt 09/16/22. Advised may want lab results at OV,  States "I will just take care of that then instead of 2 appts."   "Good afternoon, please let Euriah know his A1c is not on target.  It is 2.  We need between 2.5 and 3.5.  I want to adjust his Coumadin to taking 8 MG 4 days a week and then 8.5 MG 3 days a week -- he can determine the days he wants to add on the extra 1/2.  Any questions?  Want to see him in office in 2 weeks for INR recheck please. Keep being awesome!!  Thank you for allowing me to participate in your care.  I appreciate you. Kindest regards, Jolene"

## 2022-09-11 NOTE — Patient Instructions (Addendum)
Hold Coumadin for 2 days and then restart at 7 MG daily.  Return on Monday for INR.  Diabetes Mellitus and Nutrition, Adult When you have diabetes, or diabetes mellitus, it is very important to have healthy eating habits because your blood sugar (glucose) levels are greatly affected by what you eat and drink. Eating healthy foods in the right amounts, at about the same times every day, can help you: Manage your blood glucose. Lower your risk of heart disease. Improve your blood pressure. Reach or maintain a healthy weight. What can affect my meal plan? Every person with diabetes is different, and each person has different needs for a meal plan. Your health care provider may recommend that you work with a dietitian to make a meal plan that is best for you. Your meal plan may vary depending on factors such as: The calories you need. The medicines you take. Your weight. Your blood glucose, blood pressure, and cholesterol levels. Your activity level. Other health conditions you have, such as heart or kidney disease. How do carbohydrates affect me? Carbohydrates, also called carbs, affect your blood glucose level more than any other type of food. Eating carbs raises the amount of glucose in your blood. It is important to know how many carbs you can safely have in each meal. This is different for every person. Your dietitian can help you calculate how many carbs you should have at each meal and for each snack. How does alcohol affect me? Alcohol can cause a decrease in blood glucose (hypoglycemia), especially if you use insulin or take certain diabetes medicines by mouth. Hypoglycemia can be a life-threatening condition. Symptoms of hypoglycemia, such as sleepiness, dizziness, and confusion, are similar to symptoms of having too much alcohol. Do not drink alcohol if: Your health care provider tells you not to drink. You are pregnant, may be pregnant, or are planning to become pregnant. If you drink  alcohol: Limit how much you have to: 0-1 drink a day for women. 0-2 drinks a day for men. Know how much alcohol is in your drink. In the [place] one drink equals one 12 oz bottle of beer (355 mL), one 5 oz glass of wine (148 mL), or one 1 oz glass of hard liquor (44 mL). Keep yourself hydrated with water, diet soda, or unsweetened iced tea. Keep in mind that regular soda, juice, and other mixers may contain a lot of sugar and must be counted as carbs. What are tips for following this plan?  Reading food labels Start by checking the serving size on the Nutrition Facts label of packaged foods and drinks. The number of calories and the amount of carbs, fats, and other nutrients listed on the label are based on one serving of the item. Many items contain more than one serving per package. Check the total grams (g) of carbs in one serving. Check the number of grams of saturated fats and trans fats in one serving. Choose foods that have a low amount or none of these fats. Check the number of milligrams (mg) of salt (sodium) in one serving. Most people should limit total sodium intake to less than 2,300 mg per day. Always check the nutrition information of foods labeled as "low-fat" or "nonfat." These foods may be higher in added sugar or refined carbs and should be avoided. Talk to your dietitian to identify your daily goals for nutrients listed on the label. Shopping Avoid buying canned, pre-made, or processed foods. These foods tend to be high  in fat, sodium, and added sugar. Shop around the outside edge of the grocery store. This is where you will most often find fresh fruits and vegetables, bulk grains, fresh meats, and fresh dairy products. Cooking Use low-heat cooking methods, such as baking, instead of high-heat cooking methods, such as deep frying. Cook using healthy oils, such as olive, canola, or sunflower oil. Avoid cooking with butter, cream, or high-fat meats. Meal planning Eat meals and  snacks regularly, preferably at the same times every day. Avoid going long periods of time without eating. Eat foods that are high in fiber, such as fresh fruits, vegetables, beans, and whole grains. Eat 4-6 oz (112-168 g) of lean protein each day, such as lean meat, chicken, fish, eggs, or tofu. One ounce (oz) (28 g) of lean protein is equal to: 1 oz (28 g) of meat, chicken, or fish. 1 egg.  cup (62 g) of tofu. Eat some foods each day that contain healthy fats, such as avocado, nuts, seeds, and fish. What foods should I eat? Fruits Berries. Apples. Oranges. Peaches. Apricots. Plums. Grapes. Mangoes. Papayas. Pomegranates. Kiwi. Cherries. Vegetables Leafy greens, including lettuce, spinach, kale, chard, collard greens, mustard greens, and cabbage. Beets. Cauliflower. Broccoli. Carrots. Green beans. Tomatoes. Peppers. Onions. Cucumbers. Brussels sprouts. Grains Whole grains, such as whole-wheat or whole-grain bread, crackers, tortillas, cereal, and pasta. Unsweetened oatmeal. Quinoa. Brown or wild rice. Meats and other proteins Seafood. Poultry without skin. Lean cuts of poultry and beef. Tofu. Nuts. Seeds. Dairy Low-fat or fat-free dairy products such as milk, yogurt, and cheese. The items listed above may not be a complete list of foods and beverages you can eat and drink. Contact a dietitian for more information. What foods should I avoid? Fruits Fruits canned with syrup. Vegetables Canned vegetables. Frozen vegetables with butter or cream sauce. Grains Refined white flour and flour products such as bread, pasta, snack foods, and cereals. Avoid all processed foods. Meats and other proteins Fatty cuts of meat. Poultry with skin. Breaded or fried meats. Processed meat. Avoid saturated fats. Dairy Full-fat yogurt, cheese, or milk. Beverages Sweetened drinks, such as soda or iced tea. The items listed above may not be a complete list of foods and beverages you should avoid. Contact a  dietitian for more information. Questions to ask a health care provider Do I need to meet with a certified diabetes care and education specialist? Do I need to meet with a dietitian? What number can I call if I have questions? When are the best times to check my blood glucose? Where to find more information: American Diabetes Association: diabetes.org Academy of Nutrition and Dietetics: eatright.[employer] of Diabetes and Digestive and Kidney Diseases: StageSync.si Association of Diabetes Care & Education Specialists: diabeteseducator.org Summary It is important to have healthy eating habits because your blood sugar (glucose) levels are greatly affected by what you eat and drink. It is important to use alcohol carefully. A healthy meal plan will help you manage your blood glucose and lower your risk of heart disease. Your health care provider may recommend that you work with a dietitian to make a meal plan that is best for you. This information is not intended to replace advice given to you by your health care provider. Make sure you discuss any questions you have with your health care provider. Document Revised: 11/27/2019 Document Reviewed: 11/27/2019 Elsevier Patient Education  2023 [employer].

## 2022-09-16 ENCOUNTER — Encounter: Payer: Self-pay | Admitting: Nurse Practitioner

## 2022-09-16 ENCOUNTER — Ambulatory Visit (INDEPENDENT_AMBULATORY_CARE_PROVIDER_SITE_OTHER): Payer: Self-pay | Admitting: Nurse Practitioner

## 2022-09-16 VITALS — BP 140/80 | HR 87 | Temp 97.9°F | Ht 67.99 in | Wt 257.7 lb

## 2022-09-16 DIAGNOSIS — Z7901 Long term (current) use of anticoagulants: Secondary | ICD-10-CM

## 2022-09-16 DIAGNOSIS — M545 Low back pain, unspecified: Secondary | ICD-10-CM

## 2022-09-16 DIAGNOSIS — E1129 Type 2 diabetes mellitus with other diabetic kidney complication: Secondary | ICD-10-CM

## 2022-09-16 DIAGNOSIS — Z952 Presence of prosthetic heart valve: Secondary | ICD-10-CM

## 2022-09-16 DIAGNOSIS — G8929 Other chronic pain: Secondary | ICD-10-CM

## 2022-09-16 LAB — COAGUCHEK XS/INR WAIVED
INR: 5.9 (ref 0.9–1.1)
Prothrombin Time: 70.6 s

## 2022-09-16 NOTE — Assessment & Plan Note (Signed)
Chronic, ongoing with Coumadin use.  INR today trend up to 5.9, has been taking Ibuprofen. Have advised him to immediately STOP Ibuprofen due to risk for bleeding with this and start Tylenol or OTC Voltaren gel or Icy/Hot patches.  Remain off Fenofibrate. Elected to continue HOLD Coumadin for 2 days and then restart at 7 MG daily.  Return Monday for INR check. Return sooner to office if any bleeding or concerns.

## 2022-09-16 NOTE — Assessment & Plan Note (Signed)
Ongoing -- do not take Ibuprofen.  May take Tylenol as needed + use Icy/Hot patches or Voltaren gel.  Alternate ice and heat.  Consider PT in future once insurance on board.

## 2022-09-16 NOTE — Assessment & Plan Note (Signed)
Refer to mechanical valve plan of care. ?

## 2022-09-16 NOTE — Progress Notes (Signed)
   BP (!) 140/80 (BP Location: Left Arm)   Pulse 87   Temp 97.9 F (36.6 C) (Oral)   Ht 5' 7.99" (1.727 m)   Wt 257 lb 11.2 oz (116.9 kg)   SpO2 95%   BMI 39.19 kg/m    Subjective:    Patient ID: Rodney Chary [name], male    DOB: 09-Mar-1961, 62 y.o.   MRN: [id number]  CC: Coumadin management  HPI: This patient is a 62 y.o. male who presents for coumadin management. The expected duration of coumadin treatment is lifelong. The reason for anticoagulation is  mechanical heart valve. Follows with cardiology, Dr. Darrold Junker, last on 09/14/2022, no changes, but he told patient he could take Viagra.  Echo 03/13/2020 with LVEF greater then 55%, stable valve.  Fenofibrate stopped months back due to elevation in INR with this on board in past and risk for interaction.    Has been taking 800 MG Ibuprofen for his lower back pain -- once or twice a day.  He recently was fired from job, prior to this was lifting heavy at work.  Has never been fired in his life.  INR today is elevated at 5.9, denies bleeding.  Present Coumadin dose: 8 MG daily Goal: 2.5-3.5 The patient does not have an active anticoagulation episode. Excessive bruising: no Nose bleeding: no Rectal bleeding: no Eating diet with consistent amounts of foods containing Vitamin K:no Any recent antibiotic use? yes, as above  Relevant past medical, surgical, family and social history reviewed and updated as indicated. Interim medical history since our last visit reviewed. Allergies and medications reviewed and updated.  ROS: Per HPI unless specifically indicated above     Objective:    BP (!) 140/80 (BP Location: Left Arm)   Pulse 87   Temp 97.9 F (36.6 C) (Oral)   Ht 5' 7.99" (1.727 m)   Wt 257 lb 11.2 oz (116.9 kg)   SpO2 95%   BMI 39.19 kg/m   Wt Readings from Last 3 Encounters:  09/16/22 257 lb 11.2 oz (116.9 kg)  08/19/22 258 lb 1.6 oz (117.1 kg)  07/13/22 257 lb 11.2 oz (116.9 kg)    General: Well appearing, well  nourished in no distress.  Normal mood and affect. Skin: No excessive bruising or rash  Last INR: 2.0  Last CBC:  Lab Results  Component Value Date   WBC 9.9 04/11/2022   HGB 16.1 04/11/2022   HCT 50.0 04/11/2022   MCV 87.1 04/11/2022   PLT 191 04/11/2022    Results for orders placed or performed in visit on 09/16/22  CoaguChek XS/INR Waived  Result Value Ref Range   INR 5.9 (HH) 0.9 - 1.1   Prothrombin Time 70.6 sec    Assessment:     ICD-10-CM   1. History of mechanical aortic valve replacement  Z95.2 CoaguChek XS/INR Waived    CoaguChek XS/INR Waived    CANCELED: CoaguChek XS/INR Waived    2. Current use of long term anticoagulation  Z79.01 CoaguChek XS/INR Waived    CoaguChek XS/INR Waived    CANCELED: CoaguChek XS/INR Waived    3. Chronic bilateral low back pain without sciatica  M54.50    G89.29       Plan:   Discussed current plan face-to-face with patient. For coumadin dosing, hold for 2 days and then restart at 7 MG daily.  Will plan to recheck INR on Monday.

## 2022-09-19 ENCOUNTER — Other Ambulatory Visit: Payer: Self-pay

## 2022-09-19 ENCOUNTER — Other Ambulatory Visit: Payer: 59

## 2022-09-19 DIAGNOSIS — Z7901 Long term (current) use of anticoagulants: Secondary | ICD-10-CM

## 2022-09-19 DIAGNOSIS — Z952 Presence of prosthetic heart valve: Secondary | ICD-10-CM

## 2022-09-19 LAB — COAGUCHEK XS/INR WAIVED
INR: 1.8 — ABNORMAL HIGH (ref 0.9–1.1)
Prothrombin Time: 21.8 s

## 2022-09-19 NOTE — Progress Notes (Signed)
Please let Rodney Dixon know his INR has come down!!  Good news.  It is now too low at 1.8, but better then the last one at 5.9.  Right now I want him to go to 7.5 MG Coumadin daily (this is one of his 5 MG and one of his 2.5 MG pills) --- if he needs refills let me know.  I would like him to return in one week for visit with me and INR check.  Please schedule.  Also tell him to please ensure he talks to the team that will be calling him to assist getting Medicaid and assist with medication costs.  Any questions?

## 2022-09-19 NOTE — Progress Notes (Signed)
   09/19/2022  Patient ID: Rodney Chary [name], male   DOB: 1960-08-09, 62 y.o.   MRN: [id number]  Patient outreach to schedule telephone visit to assist with affordability of medications.  Scheduled for Tuesday 5/14 at 9am.  Lenna Gilford, PharmD, DPLA

## 2022-09-20 ENCOUNTER — Other Ambulatory Visit: Payer: Self-pay

## 2022-09-20 NOTE — Progress Notes (Signed)
09/20/2022 Name: Rodney EVELYN [name] MRN: [id number] DOB: 11-01-1960  Chief Complaint  Patient presents with   Medication Adherence   Rodney Chary [name] is a 62 y.o. year old male who presented for a telephone visit.   They were referred to the pharmacist by their PCP for assistance in managing complex medication management.   Patient is participating in a Managed Medicaid Plan:  No  Subjective: Telephone visit with patient to discuss adherence and affordability of medications based on clinic request from PCP, Jolene Cannady.  Care Team: Primary Care Provider: Marjie Skiff, NP ; Next Scheduled Visit: 5/23  Medication Access/Adherence Current Pharmacy:  [id number] - [place], [place] - [address] [place] [address] Phone: [phone] Fax: [phone] -Patient reports affordability concerns with their medications: Yes  -Patient reports access/transportation concerns to their pharmacy: No  -Patient reports adherence concerns with their medications:  Yes    Medication Management: Current adherence strategy: patient does not use weekly pill organizer -Patient reports Fair adherence to medications -Patient reports the following barriers to adherence: affordability and understanding of regimen -Patient recently discovered he does have insurance coverage through the marketplace -Reviewed current medication list and identified the following discrepancies:  1. Patient has continued to take fenofibrate up until our discussion 5/14- endorses being uncertain if he was supposed to stop it  2. Not taking atorvastatin 80mg  but not sure why  3. Not taking Marcelline Deist due to cost  4. Taking 54 units of NPH each morning versus prescribed 50 units  4. Rarely checks BG at home b/c Dexcom "did not work well for him" and buys test strips OTC (costly) -Currently using Ozempic samples provided by [facility]  Objective: Lab Results  Component Value Date   HGBA1C 11.7 (H) 08/19/2022    Lab Results  Component Value Date   CREATININE 1.24 08/19/2022   BUN 23 08/19/2022   NA 137 08/19/2022   K 3.9 08/19/2022   CL 98 08/19/2022   CO2 20 08/19/2022   Lab Results  Component Value Date   CHOL 141 08/19/2022   HDL 28 (L) 08/19/2022   LDLCALC 36 08/19/2022   TRIG 552 (HH) 08/19/2022   CHOLHDL 7.4 (H) 06/17/2020   Medications Reviewed Today     Reviewed by Lenna Gilford, RPH (Pharmacist) on 09/20/22 at 0915  Med List Status: <None>   Medication Order Taking? Sig Documenting Provider Last Dose Status Informant  albuterol (VENTOLIN HFA) 108 (90 Base) MCG/ACT inhaler [id number] Yes TAKE 2 PUFFS BY MOUTH EVERY 6 HOURS AS NEEDED Cannady, Jolene T, NP Taking Active   amLODipine (NORVASC) 5 MG tablet [id number] Yes Take 1 tablet (5 mg total) by mouth daily. Aura Dials T, NP Taking Active   atorvastatin (LIPITOR) 80 MG tablet [id number] No Take 1 tablet (80 mg total) by mouth daily. STOP 40 MG ATORVASTATIN AND START THIS DOSE.  Patient not taking: Reported on 09/20/2022   Marjie Skiff, NP Not Taking Active            Med Note Littie Deeds, Vonnie Ligman A   Tue Sep 20, 2022  9:08 AM) Does not have this dose at home  benazepril (LOTENSIN) 40 MG tablet [id number] Yes Take 1 tablet (40 mg total) by mouth daily. Aura Dials T, NP Taking Active   carvedilol (COREG) 25 MG tablet [id number] Yes TAKE 1 TABLET (25 MG TOTAL) BY MOUTH 2 (TWO) TIMES DAILY WITH A MEAL. Marjie Skiff, NP Taking Active  Continuous Blood Gluc Sensor (FREESTYLE LIBRE 14 DAY SENSOR) [place] [id number] No To check blood sugar four times a day.  Patient not taking: Reported on 09/20/2022   Marjie Skiff, NP Not Taking Active            Med Note Rodney Niemann A   Tue Sep 20, 2022  9:09 AM) Did not seem to work well  dapagliflozin propanediol (FARXIGA) 10 MG TABS tablet [id number] No Take 1 tablet (10 mg total) by mouth daily.  Patient not taking: Reported on 09/20/2022   Marjie Skiff, NP Not Taking  Active            Med Note Littie Deeds, Aiyannah Fayad A   Tue Sep 20, 2022  9:09 AM) Does not have at home  gabapentin (NEURONTIN) 600 MG tablet [id number] Yes Take 600 MG (one tablet) by mouth in morning, then 600 MG (one tablet) by mouth at lunch, and then 1200 MG (two tablets) by mouth at night before bedtime. Aura Dials T, NP Taking Active   Glucose Blood (BLOOD GLUCOSE TEST STRIPS) STRP [id number] Yes 1 strip by In Vitro route daily. Steele Sizer, MD Taking Active Self           Med Note Rodney Niemann A   Tue Sep 20, 2022  9:11 AM) Reli-On Prime  hydrochlorothiazide (HYDRODIURIL) 25 MG tablet [id number] Yes TAKE 1 TABLET (25 MG TOTAL) BY MOUTH DAILY. Aura Dials T, NP Taking Active   Insulin NPH, Human,, Isophane, (NOVOLIN N FLEXPEN) 100 UNIT/ML Ulice Brilliant [id number] Yes Inject 50 Units into the skin every morning. And pen needles 1/day Marjie Skiff, NP Taking Active            Med Note Littie Deeds, Alilah Mcmeans A   Tue Sep 20, 2022  9:12 AM) 54 units in the morning  Insulin Pen Needle 32G X 4 MM MISC [id number] Yes Use with insulin pen dosing McElwee, Lauren A, NP Taking Active   meclizine (ANTIVERT) 12.5 MG tablet [id number] Yes Take 1 tablet (12.5 mg total) by mouth 3 (three) times daily as needed for dizziness. Aura Dials T, NP Taking Active   Semaglutide,0.25 or 0.5MG /DOS, (OZEMPIC, 0.25 OR 0.5 MG/DOSE,) 2 MG/1.5ML SOPN [id number] Yes Inject 0.5 mg into the skin once a week. [provider] Taking Active            Med Note Rodney Niemann A   Tue Sep 20, 2022  9:13 AM) Samples through June  tamsulosin (FLOMAX) 0.4 MG CAPS capsule [id number] Yes TAKE 1 CAPSULE BY MOUTH EVERY DAY AFTER SUPPER Cannady, Jolene T, NP Taking Active   warfarin (COUMADIN) 10 MG tablet [id number] No Take 10 mg by mouth daily.  Patient not taking: Reported on 09/20/2022   [provider] Not Taking Active   warfarin (COUMADIN) 2.5 MG tablet [id number] No TAKE 1 TABLET BY MOUTH EVERY DAY  Patient not  taking: Reported on 09/20/2022   Aura Dials T, NP Not Taking Active   warfarin (COUMADIN) 4 MG tablet [id number] Yes Take 1 tablet (4 mg total) by mouth daily. Aura Dials T, NP Taking Active            Med Note Littie Deeds, Rj Pedrosa A   Tue Sep 20, 2022  9:14 AM) Taking 8mg  currently   warfarin (COUMADIN) 5 MG tablet [id number] No START TAKING 9 MG DAILY -- ONE 4 MG PILL AND ONE 5 MG PILL.  Patient not taking: Reported on 09/20/2022   Marjie Skiff, NP Not  Taking Active            Assessment/Plan:   Medication Management: - Currently strategy insufficient to maintain appropriate adherence to prescribed medication regimen - Suggested use of weekly pill box to organize medications - Contacted pharmacy to verify insurance information is on file - Requested refills to be processed for atorvastatin, Ozempic, and Farxiga - Atorvastatin going through for $3/30 days - Ozempic requires a PA, Trulicity preferred by insurance; but patient tried in the past and struggled with administration of the device.  I am pursuing a PA currently. Marcelline Deist going through but >$400; online formulary states [place] Pepper is preferred.  Patient is okay with changing, so pending rx for Jardiance 25mg  daily and will also obtain copay card. - Insurance prefers Accu Check [employer] diabetic testing supplies; pending prescriptions for Jolene to sign - Patient has stopped fenofibrate and will resume atorvastatin  Follow Up Plan: Will check on price of Jardiance and testing supplies at pharmacy, as well as any other medications in process for patient once orders are sent.  Will also follow PA status and keep patient informed.  Once medication issues sorted out, will schedule routine follow-up for BG and adherence.  Lenna Gilford, PharmD, DPLA

## 2022-09-21 ENCOUNTER — Telehealth: Payer: Self-pay

## 2022-09-21 NOTE — Telephone Encounter (Signed)
   CCM RN Visit Note   09-21-2022 Name: Rodney KAWALEC [name] MRN: [id number]      DOB: Mar 20, 1961  Subjective: Rodney Chary [name] is a 62 y.o. year old male who is a primary care patient of Aura Dials, NP The patient was referred to the Chronic Care Management team for assistance with care management needs subsequent to provider initiation of CCM services and plan of care.      An unsuccessful telephone outreach was attempted today to contact the patient about Chronic Care Management needs.    Plan:The care management team will reach out to the patient again over the next 30 days.  Alto Denver RN, MSN, CCM RN Care Manager  Chronic Care Management Direct Number: [phone]

## 2022-09-22 MED ORDER — ACCU-CHEK AVIVA PLUS VI STRP: ORAL_STRIP | 12 refills | Status: DC

## 2022-09-22 MED ORDER — ACCU-CHEK AVIVA PLUS W/DEVICE KIT: PACK | 0 refills | Status: AC

## 2022-09-22 MED ORDER — EMPAGLIFLOZIN 25 MG PO TABS: 25.0000 mg | ORAL_TABLET | Freq: Every day | ORAL | 11 refills | Status: DC

## 2022-09-22 MED ORDER — ACCU-CHEK SOFTCLIX LANCETS MISC
12 refills | Status: DC
Start: 2022-09-22 — End: 2023-09-20

## 2022-09-23 ENCOUNTER — Telehealth: Payer: Self-pay

## 2022-09-23 NOTE — Telephone Encounter (Signed)
PA started for Jardiance 25mg  through Covermy meds. Awaiting on determination  

## 2022-09-23 NOTE — Progress Notes (Signed)
   09/23/2022  Patient ID: Rodney Chary [name], male   DOB: 05-01-1961, 62 y.o.   MRN: [id number]  [facility] and testing supplies  Testing supplies are covered by plan and in process:  lancets $11.64, strips $19.99, meter on order so price not available.  Will let patient know; if not affordable, he can continue to purchase OTC.  Jardiance requiring a PA and that has been sent to [facility] to complete; will make sure office has received and is working on.  Ozempic PA in determination phase currently.  If PA's are denied for Jardiance and/or Ozempic, I will look I will look into other means of assistance (grant programs +/- copay cards).  Lenna Gilford, PharmD, DPLA

## 2022-09-23 NOTE — Progress Notes (Signed)
  Care Coordination Note  09/23/2022 Name: ADLAI GRAEVE [name] MRN: [id number] DOB: [birth year]  Rodney Chary [name] is a 62 y.o. year old male who is a primary care patient of Marjie Skiff, NP and is actively engaged with the Chronic Care Management team. I reached out to Rodney Chary [name] by phone today to assist with re-scheduling an initial visit with the RN Case Manager  Follow up plan: Unsuccessful telephone outreach attempt made. The care management team will reach out to the patient again over the next 7 days.  If patient returns call to provider office, please advise to call CCM Care Guide Penne Lash  at [phone]  Penne Lash, RMA Care Guide [facility]  [place], [place] [address] Direct Dial: [phone] [email or web address]

## 2022-09-25 NOTE — Patient Instructions (Signed)
Warfarin Blood Tests You will learn about the importance of your warfarin dosing, testing, and precautions. Also, signs that your warfarin dose is too high. To view the content, go to this web address: [email or web address]  This video will expire on: 04/20/2024. If you need access to this video following this date, please reach out to the healthcare provider who assigned it to you. This information is not intended to replace advice given to you by your health care provider. Make sure you discuss any questions you have with your health care provider. Elsevier Patient Education  2023 Elsevier Inc.  

## 2022-09-26 ENCOUNTER — Telehealth: Payer: Self-pay

## 2022-09-27 ENCOUNTER — Telehealth: Payer: Self-pay

## 2022-09-27 NOTE — Telephone Encounter (Signed)
PA started for Jardiance through Covermy meds. Awaiting on determination

## 2022-09-28 ENCOUNTER — Telehealth: Payer: Self-pay

## 2022-09-28 NOTE — Telephone Encounter (Signed)
PA for Jardiance through Covermy meds has been approved

## 2022-09-28 NOTE — Progress Notes (Signed)
   09/28/2022  Patient ID: [name], male   DOB: 1960-11-04, 62 y.o.   MRN: [id number]  Patient outreach to follow-up on status of prior authorizations.  Ozempic PA has been denied, and plan prefers Trulicity.  Patient would like for me to pursue an appeal based on previous difficult with administration of Trulicity and current drug shortage.  I will complete appeal form and send to office to fax into insurance.  Advised patient that if this is denied, our only feasible option may be Trulicity; but I am happy to see him in person to educate on pen use.  Jardiance prior authorization is still in process, and I have a copay card to apply if/when approved.  [employer] testing supplies are on profile at pharmacy for when he needs these, but he states he has plenty on hand for now.  Will follow-up with patient when Jardiance and Ozempic outcomes are known and go from there.  Lenna Gilford, PharmD, DPLA

## 2022-09-29 ENCOUNTER — Ambulatory Visit (INDEPENDENT_AMBULATORY_CARE_PROVIDER_SITE_OTHER): Payer: 59 | Admitting: Nurse Practitioner

## 2022-09-29 ENCOUNTER — Encounter: Payer: Self-pay | Admitting: Nurse Practitioner

## 2022-09-29 VITALS — BP 121/75 | HR 85 | Temp 97.4°F | Ht 67.99 in | Wt 259.8 lb

## 2022-09-29 DIAGNOSIS — Z952 Presence of prosthetic heart valve: Secondary | ICD-10-CM

## 2022-09-29 DIAGNOSIS — Z7901 Long term (current) use of anticoagulants: Secondary | ICD-10-CM

## 2022-09-29 LAB — COAGUCHEK XS/INR WAIVED
INR: 2.3 — ABNORMAL HIGH (ref 0.9–1.1)
Prothrombin Time: 27.5 s

## 2022-09-29 MED ORDER — SILDENAFIL CITRATE 100 MG PO TABS: 50.0000 mg | ORAL_TABLET | Freq: Every day | ORAL | 0 refills | Status: DC | PRN

## 2022-09-29 NOTE — Assessment & Plan Note (Signed)
Chronic, ongoing with Coumadin use.  INR today 2.3, improving. Have advised him not to take Ibuprofen due to risk for bleeding with this and only use Tylenol or OTC Voltaren gel or Icy/Hot patches.  Remain off Fenofibrate. Elected to continue 7.5 MG Coumadin daily.  Return INR in 4 weeks. Return sooner to office if any bleeding or concerns.

## 2022-09-29 NOTE — Assessment & Plan Note (Signed)
Refer to mechanical valve plan of care. ?

## 2022-09-29 NOTE — Progress Notes (Signed)
   BP 121/75   Pulse 85   Temp (!) 97.4 F (36.3 C) (Oral)   Ht 5' 7.99" (1.727 m)   Wt 259 lb 12.8 oz (117.8 kg)   SpO2 98%   BMI 39.51 kg/m    Subjective:    Patient ID: Rodney Chary [name], male    DOB: 1960-09-12, 62 y.o.   MRN: [id number]  CC: Coumadin management  HPI: This patient is a 62 y.o. male who presents for coumadin management. The expected duration of coumadin treatment is lifelong. The reason for anticoagulation is  mechanical heart valve. Follows with cardiology, Dr. Darrold Junker, last on 09/14/2022, no changes, but he told patient he could take Viagra.  Echo 03/13/2020 with LVEF greater then 55%, stable valve.    Present Coumadin dose: 7.5 MG daily Goal: 2.5-3.5 The patient does not have an active anticoagulation episode. Excessive bruising: no Nose bleeding: no Rectal bleeding: no Eating diet with consistent amounts of foods containing Vitamin K:no Any recent antibiotic use? no  Relevant past medical, surgical, family and social history reviewed and updated as indicated. Interim medical history since our last visit reviewed. Allergies and medications reviewed and updated.  ROS: Per HPI unless specifically indicated above     Objective:    BP 121/75   Pulse 85   Temp (!) 97.4 F (36.3 C) (Oral)   Ht 5' 7.99" (1.727 m)   Wt 259 lb 12.8 oz (117.8 kg)   SpO2 98%   BMI 39.51 kg/m   Wt Readings from Last 3 Encounters:  09/29/22 259 lb 12.8 oz (117.8 kg)  09/16/22 257 lb 11.2 oz (116.9 kg)  08/19/22 258 lb 1.6 oz (117.1 kg)    General: Well appearing, well nourished in no distress.  Normal mood and affect. Skin: No excessive bruising or rash  Last INR: 1.8  Last CBC:  Lab Results  Component Value Date   WBC 9.9 04/11/2022   HGB 16.1 04/11/2022   HCT 50.0 04/11/2022   MCV 87.1 04/11/2022   PLT 191 04/11/2022    Results for orders placed or performed in visit on 09/19/22  CoaguChek XS/INR Waived  Result Value Ref Range   INR 1.8 (H) 0.9 - 1.1    Prothrombin Time 21.8 sec    Assessment:     ICD-10-CM   1. History of mechanical aortic valve replacement  Z95.2 CoaguChek XS/INR Waived    2. Current use of long term anticoagulation  Z79.01 CoaguChek XS/INR Waived      Plan:   Discussed current plan face-to-face with patient. For coumadin dosing, continue 7.5 MG dosing as is near goal and recently started this.  Will plan to recheck INR in 4 weeks.

## 2022-10-06 NOTE — Progress Notes (Signed)
  Chronic Care Management Note  10/06/2022 Name: Rodney CREDIT [name] MRN: [id number] DOB: [birth year]  [name] is a 62 y.o. year old male who is a primary care patient of Marjie Skiff, NP and is actively engaged with the Chronic Care Management team. I reached out to [name] by phone today to assist with re-scheduling an initial visit with the RN Case Manager  Follow up plan: Unsuccessful telephone outreach attempt made The care management team will reach out to the patient again over the next 7 days.  If patient returns call to provider office, please advise to call CCM Care Guide Penne Lash  at [phone]  Penne Lash, RMA Care Guide [facility]  [place], [place] [address] Direct Dial: [phone] [email or web address]

## 2022-10-07 ENCOUNTER — Ambulatory Visit (INDEPENDENT_AMBULATORY_CARE_PROVIDER_SITE_OTHER): Payer: Self-pay | Admitting: Physician Assistant

## 2022-10-07 ENCOUNTER — Encounter: Payer: Self-pay | Admitting: Physician Assistant

## 2022-10-07 VITALS — BP 126/86 | HR 88 | Temp 97.6°F | Ht 67.99 in | Wt 261.0 lb

## 2022-10-07 DIAGNOSIS — M5442 Lumbago with sciatica, left side: Secondary | ICD-10-CM

## 2022-10-07 MED ORDER — LIDOCAINE 5 % EX PTCH
1.0000 | MEDICATED_PATCH | CUTANEOUS | 0 refills | Status: DC
Start: 2022-10-07 — End: 2023-01-11

## 2022-10-07 NOTE — Patient Instructions (Addendum)
Based on your symptoms and physical exam I believe the following is the cause of your concern today Back pain likely secondary to a strain of your back muscles  I recommend the following at this time to help relieve that discomfort:  Rest Warm compresses to the area (20 minutes on, minimum of 30 minutes off) I have sent in a script for Lidocaine patches- please apply one every 24 hours as needed for pain  Tylenol is preferred due to your kidney function Gentle stretches and exercises that I have included in your paperwork Try to reduce excess strain to the area and rest as much as possible  Wear supportive shoes and, if you must lift anything, use proper lifting techniques that spare your back.   If these measures do not lead to improvement in your symptoms over the next 2-4 weeks please let us know

## 2022-10-07 NOTE — Progress Notes (Unsigned)
Acute Office Visit   Patient: Rodney HEDGES [name]   DOB: Oct 21, 1960   62 y.o. Male  MRN: [id number] Visit Date: 10/07/2022  Today's healthcare provider: Oswaldo Conroy Amado Andal, PA-C  Introduced myself to the patient as a Secondary school teacher and provided education on APPs in clinical practice.    Chief Complaint  Patient presents with   Back Pain    Mid lower back pain started about 2 days ago.   Subjective    HPI HPI     Back Pain    Additional comments: Mid lower back pain started about 2 days ago.      Last edited by Sherolyn Buba, CMA on 10/07/2022  8:37 AM.        Back Pain  Onset: sudden  Duration: 2 days ago  Location: midline lower back pain  Radiation: sudden sharp radiation down left leg  Pain level and character: sitting its at a 5/10 but standing and being active will get up to 8-10/10  Pain is worse in the AM when he gets out of bed  He denies trauma or injury, denies recent heavy lifting or falls  Other associated symptoms: denies saddle anesthesia, incontinence, or numbness, weakness  Interventions: Tylenol, Voltaren gel  Alleviating: sitting  Aggravating: standing and exertion       Medications: Outpatient Medications Prior to Visit  Medication Sig   Accu-Chek Softclix Lancets lancets Use to check blood fasting blood glucose daily   albuterol (VENTOLIN HFA) 108 (90 Base) MCG/ACT inhaler TAKE 2 PUFFS BY MOUTH EVERY 6 HOURS AS NEEDED   amLODipine (NORVASC) 5 MG tablet Take 1 tablet (5 mg total) by mouth daily.   atorvastatin (LIPITOR) 80 MG tablet Take 1 tablet (80 mg total) by mouth daily. STOP 40 MG ATORVASTATIN AND START THIS DOSE.   benazepril (LOTENSIN) 40 MG tablet Take 1 tablet (40 mg total) by mouth daily.   Blood Glucose Monitoring Suppl (ACCU-CHEK AVIVA PLUS) w/Device KIT Use to check fasting blood glucose daily   carvedilol (COREG) 25 MG tablet TAKE 1 TABLET (25 MG TOTAL) BY MOUTH 2 (TWO) TIMES DAILY WITH A MEAL.   empagliflozin (JARDIANCE) 25 MG  TABS tablet Take 1 tablet (25 mg total) by mouth daily before breakfast. To replace [place].   gabapentin (NEURONTIN) 600 MG tablet Take 600 MG (one tablet) by mouth in morning, then 600 MG (one tablet) by mouth at lunch, and then 1200 MG (two tablets) by mouth at night before bedtime.   glucose blood (ACCU-CHEK AVIVA PLUS) test strip Use to check blood fasting blood glucose daily   hydrochlorothiazide (HYDRODIURIL) 25 MG tablet TAKE 1 TABLET (25 MG TOTAL) BY MOUTH DAILY.   Insulin NPH, Human,, Isophane, (NOVOLIN N FLEXPEN) 100 UNIT/ML Kiwkpen Inject 50 Units into the skin every morning. And pen needles 1/day   Insulin Pen Needle 32G X 4 MM MISC Use with insulin pen dosing   meclizine (ANTIVERT) 12.5 MG tablet Take 1 tablet (12.5 mg total) by mouth 3 (three) times daily as needed for dizziness.   Semaglutide,0.25 or 0.5MG /DOS, (OZEMPIC, 0.25 OR 0.5 MG/DOSE,) 2 MG/1.5ML SOPN Inject 0.5 mg into the skin once a week.   sildenafil (VIAGRA) 100 MG tablet Take 0.5-1 tablets (50-100 mg total) by mouth daily as needed for erectile dysfunction.   tamsulosin (FLOMAX) 0.4 MG CAPS capsule TAKE 1 CAPSULE BY MOUTH EVERY DAY AFTER SUPPER   warfarin (COUMADIN) 10 MG tablet Take 10 mg by mouth  daily.   warfarin (COUMADIN) 2.5 MG tablet TAKE 1 TABLET BY MOUTH EVERY DAY   warfarin (COUMADIN) 4 MG tablet Take 1 tablet (4 mg total) by mouth daily.   warfarin (COUMADIN) 5 MG tablet START TAKING 9 MG DAILY -- ONE 4 MG PILL AND ONE 5 MG PILL.   No facility-administered medications prior to visit.    Review of Systems  {Labs  Heme  Chem  Endocrine  Serology  Results Review (optional):23779}   Objective    BP 126/86   Pulse 88   Temp 97.6 F (36.4 C) (Oral)   Ht 5' 7.99" (1.727 m)   Wt 261 lb (118.4 kg)   SpO2 99%   BMI 39.69 kg/m  {Show previous vital signs (optional):23777}  Physical Exam Musculoskeletal:     Thoracic back: Normal range of motion.     Lumbar back: Decreased range of motion.      Comments: Flexion is reduced Lateral rotation and lateral flexion toward the left is painful   Lower extremity strength 5/5         No results found for any visits on 10/07/22.  Assessment & Plan      No follow-ups on file.        Problem List Items Addressed This Visit       Other   Low back pain - Primary   Relevant Medications   lidocaine (LIDODERM) 5 %   Other Relevant Orders   Ambulatory referral to Physical Therapy     No follow-ups on file.   I, Veleda Mun E Joesiah Lonon, PA-C, have reviewed all documentation for this visit. The documentation on 10/07/22 for the exam, diagnosis, procedures, and orders are all accurate and complete.   Jacquelin Hawking, MHS, PA-C [facility]

## 2022-10-10 NOTE — Assessment & Plan Note (Signed)
Acute, recurrent Patient reports that back pain started about 2 days ago along lower midline aspect with intermittent sharp radiation down his left leg He denies trauma, injury, falls He states that pain is worse when he gets out of bed in the morning and is usually exacerbated by standing I have reviewed patient's most recent lab work and his diabetes does not appear well-controlled and he also has stage III kidney disease.  These conditions limit use of steroids as well as NSAIDs which we discussed during his appointment At this time I recommend that he uses Tylenol and I will provide some stretches for him to do at home.  Will also provide a prescription for lidocaine 5% patches.  I also recommend he continues to use Voltaren gel.  We discussed at length a referral to physical therapy which I think would benefit him given that this is a recurrent concern.  Referral was placed today. We reviewed return and ED precautions Recommend follow-up as needed for persistent or progressing symptoms

## 2022-10-18 NOTE — Telephone Encounter (Signed)
Noted  

## 2022-10-18 NOTE — Progress Notes (Signed)
  Care Coordination Note  10/18/2022 Name: Rodney LASECKI [name] MRN: [id number] DOB: 03/01/1961  Rodney Chary [name] is a 62 y.o. year old male who is a primary care patient of Marjie Skiff, NP and is actively engaged with the Chronic Care Management team. I reached out to Rodney Chary [name] by phone today to assist with re-scheduling an initial visit with the RN Case Manager  Follow up plan: Unable to make contact on outreach attempts x 3. PCP Marjie Skiff, NP notified via routed documentation in medical record.   Penne Lash, RMA Care Guide [facility]  [place], [place] [address] Direct Dial: [phone] [email or web address]

## 2022-10-23 NOTE — Patient Instructions (Addendum)
Increase Coumadin to 8 MG two days a week and 7.5 MG the remainder.  Warfarin Life Changes You will learn about lifestyle changes and precautions required when taking warfarin.  To view the content, go to this web address: [email or web address]  This video will expire on: 07/06/2024. If you need access to this video following this date, please reach out to the healthcare provider who assigned it to you. This information is not intended to replace advice given to you by your health care provider. Make sure you discuss any questions you have with your health care provider. Elsevier Patient Education  2024 [employer].

## 2022-10-26 ENCOUNTER — Other Ambulatory Visit: Payer: Self-pay | Admitting: Nurse Practitioner

## 2022-10-26 NOTE — Telephone Encounter (Signed)
Requested Prescriptions  Pending Prescriptions Disp Refills   atorvastatin (LIPITOR) 80 MG tablet [Pharmacy Med Name: ATORVASTATIN 80 MG TABLET] 90 tablet 4    Sig: TAKE 1 TABLET (80 MG TOTAL) BY MOUTH DAILY. STOP 40 MG ATORVASTATIN AND START THIS DOSE.     Cardiovascular:  Antilipid - Statins Failed - 10/26/2022  2:15 AM      Failed - Lipid Panel in normal range within the last 12 months    Cholesterol, Total  Date Value Ref Range Status  08/19/2022 141 100 - 199 mg/dL Final   LDL Chol Calc (NIH)  Date Value Ref Range Status  08/19/2022 36 0 - 99 mg/dL Final   HDL  Date Value Ref Range Status  08/19/2022 28 (L) >39 mg/dL Final   Triglycerides  Date Value Ref Range Status  08/19/2022 552 (HH) 0 - 149 mg/dL Final         Passed - Patient is not pregnant      Passed - Valid encounter within last 12 months    Recent Outpatient Visits           2 weeks ago Acute midline low back pain with left-sided sciatica   [place] [facility] Mecum, Erin E, PA-C   3 weeks ago History of mechanical aortic valve replacement   [place] [facility] [place], Lorenzo T, NP   1 month ago History of mechanical aortic valve replacement   [place] [facility] [place], [place] T, NP   2 months ago Type 2 diabetes mellitus with proteinuria (HCC)   [place] [facility] [place], Corrie Dandy T, NP   3 months ago History of mechanical aortic valve replacement   [place] [facility] [place], [name], NP       Future Appointments             In 2 days Cann[place], [name], NP [place] [facility], [facility]   In 1 month [place], [name], NP [place] [facility], [facility]

## 2022-10-27 ENCOUNTER — Other Ambulatory Visit: Payer: Self-pay | Admitting: Nurse Practitioner

## 2022-10-27 NOTE — Telephone Encounter (Signed)
Requested Prescriptions  Pending Prescriptions Disp Refills   atorvastatin (LIPITOR) 80 MG tablet [Pharmacy Med Name: ATORVASTATIN 80 MG TABLET] 90 tablet 4    Sig: TAKE 1 TABLET (80 MG TOTAL) BY MOUTH DAILY. STOP 40 MG ATORVASTATIN AND START THIS DOSE.     Cardiovascular:  Antilipid - Statins Failed - 10/27/2022  9:09 AM      Failed - Lipid Panel in normal range within the last 12 months    Cholesterol, Total  Date Value Ref Range Status  08/19/2022 141 100 - 199 mg/dL Final   LDL Chol Calc (NIH)  Date Value Ref Range Status  08/19/2022 36 0 - 99 mg/dL Final   HDL  Date Value Ref Range Status  08/19/2022 28 (L) >39 mg/dL Final   Triglycerides  Date Value Ref Range Status  08/19/2022 552 (HH) 0 - 149 mg/dL Final         Passed - Patient is not pregnant      Passed - Valid encounter within last 12 months    Recent Outpatient Visits           2 weeks ago Acute midline low back pain with left-sided sciatica   [place] [facility] Mecum, Erin E, PA-C   4 weeks ago History of mechanical aortic valve replacement   [place] [facility] [place], [place] T, NP   1 month ago History of mechanical aortic valve replacement   [place] [facility] [place], Day T, NP   2 months ago Type 2 diabetes mellitus with proteinuria (HCC)   [place] [facility] [place], Corrie Dandy T, NP   3 months ago History of mechanical aortic valve replacement   [place] [facility] [place], [name], NP       Future Appointments             [place], [name], NP  [facility], [facility]   In 1 month [place], [name], NP  [facility], [facility]

## 2022-10-28 ENCOUNTER — Encounter: Payer: Self-pay | Admitting: Nurse Practitioner

## 2022-10-28 ENCOUNTER — Ambulatory Visit (INDEPENDENT_AMBULATORY_CARE_PROVIDER_SITE_OTHER): Payer: 59 | Admitting: Nurse Practitioner

## 2022-10-28 VITALS — BP 133/83 | HR 79 | Temp 97.6°F | Ht 67.99 in | Wt 261.6 lb

## 2022-10-28 DIAGNOSIS — Z7901 Long term (current) use of anticoagulants: Secondary | ICD-10-CM

## 2022-10-28 DIAGNOSIS — Z952 Presence of prosthetic heart valve: Secondary | ICD-10-CM

## 2022-10-28 LAB — COAGUCHEK XS/INR WAIVED
INR: 2.2 — ABNORMAL HIGH (ref 0.9–1.1)
Prothrombin Time: 26 s

## 2022-10-28 MED ORDER — WARFARIN SODIUM 4 MG PO TABS: 4.0000 mg | ORAL_TABLET | Freq: Every day | ORAL | 4 refills | Status: DC

## 2022-10-28 NOTE — Assessment & Plan Note (Signed)
Refer to mechanical valve plan of care. ?

## 2022-10-28 NOTE — Progress Notes (Signed)
   BP 133/83   Pulse 79   Temp 97.6 F (36.4 C) (Oral)   Ht 5' 7.99" (1.727 m)   Wt 261 lb 9.6 oz (118.7 kg)   SpO2 96%   BMI 39.79 kg/m    Subjective:    Patient ID: Rodney Chary [name], male    DOB: 31-Oct-1960, 62 y.o.   MRN: [id number]  CC: Coumadin management  HPI: This patient is a 62 y.o. male who presents for coumadin management. The expected duration of coumadin treatment is lifelong. The reason for anticoagulation is  mechanical heart valve. Follows with cardiology, Dr. Darrold Junker, last on 09/14/2022, no changes.  Echo 03/13/2020 with LVEF greater then 55%, stable valve.    Present Coumadin dose: 7.5 MG daily Goal: 2.5-3.5 The patient does not have an active anticoagulation episode. Excessive bruising: no Nose bleeding: no Rectal bleeding: no Eating diet with consistent amounts of foods containing Vitamin K:no Any recent antibiotic use? no  Relevant past medical, surgical, family and social history reviewed and updated as indicated. Interim medical history since our last visit reviewed. Allergies and medications reviewed and updated.  ROS: Per HPI unless specifically indicated above     Objective:    BP 133/83   Pulse 79   Temp 97.6 F (36.4 C) (Oral)   Ht 5' 7.99" (1.727 m)   Wt 261 lb 9.6 oz (118.7 kg)   SpO2 96%   BMI 39.79 kg/m   Wt Readings from Last 3 Encounters:  10/28/22 261 lb 9.6 oz (118.7 kg)  10/07/22 261 lb (118.4 kg)  09/29/22 259 lb 12.8 oz (117.8 kg)    General: Well appearing, well nourished in no distress.  Normal mood and affect. Skin: No excessive bruising or rash  Last INR: 2.3  Last CBC:  Lab Results  Component Value Date   WBC 9.9 04/11/2022   HGB 16.1 04/11/2022   HCT 50.0 04/11/2022   MCV 87.1 04/11/2022   PLT 191 04/11/2022    Results for orders placed or performed in visit on 09/29/22  CoaguChek XS/INR Waived  Result Value Ref Range   INR 2.3 (H) 0.9 - 1.1   Prothrombin Time 27.5 sec    Assessment:     ICD-10-CM    1. History of mechanical aortic valve replacement  Z95.2     2. Current use of long term anticoagulation  Z79.01 CoaguChek XS/INR Waived      Plan:   Discussed current plan face-to-face with patient. For coumadin dosing, increase to 8 MG two days a week and 7.5 MG the remainder.  Will plan to recheck INR in 2 weeks.

## 2022-10-28 NOTE — Assessment & Plan Note (Signed)
Chronic, ongoing with Coumadin use.  INR today 2.2, remaining below goal. Have advised him not to take Ibuprofen due to risk for bleeding with this and only use Tylenol or OTC Voltaren gel or Icy/Hot patches.  Remain off Fenofibrate. Elected to increase Coumadin to 8 MG two days a week and 7.5 MG Coumadin the remainder -- equals 1 MG increase total weekly.  Return INR in 2 weeks. Return sooner to office if any bleeding or concerns.

## 2022-10-31 ENCOUNTER — Other Ambulatory Visit: Payer: Self-pay | Admitting: Nurse Practitioner

## 2022-11-01 NOTE — Telephone Encounter (Signed)
Requested Prescriptions  Pending Prescriptions Disp Refills   atorvastatin (LIPITOR) 80 MG tablet [Pharmacy Med Name: ATORVASTATIN 80 MG TABLET] 90 tablet 4    Sig: TAKE 1 TABLET (80 MG TOTAL) BY MOUTH DAILY. STOP 40 MG ATORVASTATIN AND START THIS DOSE.     Cardiovascular:  Antilipid - Statins Failed - 10/31/2022  2:36 PM      Failed - Lipid Panel in normal range within the last 12 months    Cholesterol, Total  Date Value Ref Range Status  08/19/2022 141 100 - 199 mg/dL Final   LDL Chol Calc (NIH)  Date Value Ref Range Status  08/19/2022 36 0 - 99 mg/dL Final   HDL  Date Value Ref Range Status  08/19/2022 28 (L) >39 mg/dL Final   Triglycerides  Date Value Ref Range Status  08/19/2022 552 (HH) 0 - 149 mg/dL Final         Passed - Patient is not pregnant      Passed - Valid encounter within last 12 months    Recent Outpatient Visits           4 days ago History of mechanical aortic valve replacement   [place] [facility] [place], [place] T, NP   3 weeks ago Acute midline low back pain with left-sided sciatica   [place] [facility] Mecum, Erin E, PA-C   1 month ago History of mechanical aortic valve replacement   [place] [facility] [place], [place] T, NP   1 month ago History of mechanical aortic valve replacement   [place] [facility] [place], [place] T, NP   2 months ago Type 2 diabetes mellitus with proteinuria (HCC)   [place] [facility] [place], [name], NP       Future Appointments             In 1 month Cannady, [name], NP [place] [facility], [facility]

## 2022-11-02 ENCOUNTER — Telehealth: Payer: Self-pay | Admitting: Nurse Practitioner

## 2022-11-02 NOTE — Telephone Encounter (Signed)
Copied from CRM [id number]. Topic: General - Other >> Nov 02, 2022  9:58 AM Turkey B wrote: Reason for CRM: Pt called in says can't afford his meds, so is seeking help with this. Please call back

## 2022-11-03 ENCOUNTER — Telehealth: Payer: Self-pay

## 2022-11-03 NOTE — Progress Notes (Signed)
   11/03/2022  Patient ID: Rodney Chary [name], male   DOB: December 27, 1960, 62 y.o.   MRN: [id number]  S/O: Patient outreach in response to message received from PCP, Rodney Dixon, stating patient currently does not have insurance and has been without some medications.  Medication Access/Adherence -Patient states Rodney Dixon coverage will be reactivated starting 7/1, and he recently picked up 3 medications [facility] -Endorses having all active medications on hand with enough to last until insurance is active; and upon reviewing all medications he has at home, he does seem to have everything except for hydrochlorothiazide 25mg .  He states he is not taking this medication. -Rodney Dixon does not believe plan will cover Ozempic, and he is now out of sample provided by [facility]  A/P:  Medication Access/Adherence -No chart notes reflect patient was to stop hydrochlorothiazide, and it looks like a refill [facility] at [facility].  Will consult with PCP and notify patient if this is to be refilled. -Telephone visit scheduled 7/8 to obtain insurance information, so I can contact plan about formulary.  May need to redo PA for Jardiance and will see if/what GLP1 is covered.  Rodney Dixon, PharmD, DPLA

## 2022-11-07 ENCOUNTER — Telehealth: Payer: Self-pay

## 2022-11-07 NOTE — Progress Notes (Signed)
   11/07/2022  Patient ID: Rodney Chary [name], male   DOB: Sep 17, 1960, 62 y.o.   MRN: [id number]  PCP, Aura Dials, verified that [name] is supposed to be taking hydrochlorothiazide 25mg  daily.  [facility]ed [facility], and they state this was actually filled and picked up 6/26.    Called patient to verify he has the hydrochlorothiazide 25mg  on hand; he does, and he is taking daily.  Lenna Gilford, PharmD, DPLA

## 2022-11-14 ENCOUNTER — Other Ambulatory Visit: Payer: Self-pay | Admitting: Nurse Practitioner

## 2022-11-14 ENCOUNTER — Other Ambulatory Visit: Payer: Self-pay

## 2022-11-14 DIAGNOSIS — Z952 Presence of prosthetic heart valve: Secondary | ICD-10-CM

## 2022-11-14 LAB — COAGUCHEK XS/INR WAIVED
INR: 2 — ABNORMAL HIGH (ref 0.9–1.1)
Prothrombin Time: 24.3 s

## 2022-11-14 NOTE — Progress Notes (Signed)
   11/14/2022  Patient ID: Rodney Chary [name], male   DOB: 1960/06/09, 62 y.o.   MRN: [id number]  Patient outreach to see if Mr. Leaming has received his new insurance card.  Patient has not received the information, but our system is pulling in active coverage information.  Patient does not need any medication refills at this time.  I am following up with him next Monday to check on insurance and need for refills.  If he needs something filled but has yet to receive his card, I can provide the pharmacy with information our system is showing as active coverage.  Lenna Gilford, PharmD, DPLA

## 2022-11-14 NOTE — Progress Notes (Signed)
Good afternoon, please call Rodney Dixon and let him know INR is still low at 2.  I need him to increase Coumadin to 8 MG daily and recheck in 2 weeks in office please.

## 2022-11-18 ENCOUNTER — Encounter: Payer: Self-pay | Admitting: Nurse Practitioner

## 2022-11-18 ENCOUNTER — Ambulatory Visit: Payer: 59 | Admitting: Nurse Practitioner

## 2022-11-18 VITALS — BP 136/74 | HR 78 | Temp 98.3°F | Ht 67.99 in | Wt 260.4 lb

## 2022-11-18 DIAGNOSIS — L02818 Cutaneous abscess of other sites: Secondary | ICD-10-CM | POA: Diagnosis not present

## 2022-11-18 DIAGNOSIS — L0291 Cutaneous abscess, unspecified: Secondary | ICD-10-CM | POA: Insufficient documentation

## 2022-11-18 MED ORDER — SILDENAFIL CITRATE 100 MG PO TABS: 50.0000 mg | ORAL_TABLET | Freq: Every day | ORAL | 0 refills | Status: AC | PRN

## 2022-11-18 MED ORDER — DOXYCYCLINE HYCLATE 100 MG PO TABS: 100.0000 mg | ORAL_TABLET | Freq: Two times a day (BID) | ORAL | 0 refills | Status: AC

## 2022-11-18 MED ORDER — EMPAGLIFLOZIN 25 MG PO TABS: 25.0000 mg | ORAL_TABLET | Freq: Every day | ORAL | 11 refills | Status: DC

## 2022-11-18 MED ORDER — MUPIROCIN 2 % EX OINT: 1.0000 | TOPICAL_OINTMENT | Freq: Two times a day (BID) | CUTANEOUS | 5 refills | Status: DC

## 2022-11-18 NOTE — Assessment & Plan Note (Signed)
Multiple, x 3 present = 2 under right axilla and 1 upper right back.  No draining today, diabetic.  Discussed with him.  Recommend warm compresses at home and will start Doxycycline 100 MG BID + Mupirocin for possible MRSA.  Educated him on this.  He takes Coumadin, recent INR under goal at 2.  Recommend he reduce Coumadin to 7 MG, currently taking 8 MG, daily while on Coumadin due to interaction risk.  Educated him on this, if any bleeding to immediately hold Coumadin and alert provider or go to ER.

## 2022-11-18 NOTE — Patient Instructions (Addendum)
Use warm compresses on areas.  Lower your Coumadin to 7 MG daily while on Doxycycline.    Diabetes Mellitus Basics  Diabetes mellitus, or diabetes, is a long-term (chronic) disease. It occurs when the body does not properly use sugar (glucose) that is released from food after you eat. Diabetes mellitus may be caused by one or both of these problems: Your pancreas does not make enough of a hormone called insulin. Your body does not react in a normal way to the insulin that it makes. Insulin lets glucose enter cells in your body. This gives you energy. If you have diabetes, glucose cannot get into cells. This causes high blood glucose (hyperglycemia). How to treat and manage diabetes You may need to take insulin or other diabetes medicines daily to keep your glucose in balance. If you are prescribed insulin, you will learn how to give yourself insulin by injection. You may need to adjust the amount of insulin you take based on the foods that you eat. You will need to check your blood glucose levels using a glucose monitor as told by your health care provider. The readings can help determine if you have low or high blood glucose. Generally, you should have these blood glucose levels: Before meals (preprandial): 80-130 mg/dL (1.6-1.0 mmol/L). After meals (postprandial): below 180 mg/dL (10 mmol/L). Hemoglobin A1c (HbA1c) level: less than 7%. Your health care provider will set treatment goals for you. Keep all follow-up visits. This is important. Follow these instructions at home: Diabetes medicines Take your diabetes medicines every day as told by your health care provider. List your diabetes medicines here: Name of medicine: ______________________________ Amount (dose): _______________ Time (a.m./p.m.): _______________ Notes: ___________________________________ Name of medicine: ______________________________ Amount (dose): _______________ Time (a.m./p.m.): _______________ Notes:  ___________________________________ Name of medicine: ______________________________ Amount (dose): _______________ Time (a.m./p.m.): _______________ Notes: ___________________________________ Insulin If you use insulin, list the types of insulin you use here: Insulin type: ______________________________ Amount (dose): _______________ Time (a.m./p.m.): _______________Notes: ___________________________________ Insulin type: ______________________________ Amount (dose): _______________ Time (a.m./p.m.): _______________ Notes: ___________________________________ Insulin type: ______________________________ Amount (dose): _______________ Time (a.m./p.m.): _______________ Notes: ___________________________________ Insulin type: ______________________________ Amount (dose): _______________ Time (a.m./p.m.): _______________ Notes: ___________________________________ Insulin type: ______________________________ Amount (dose): _______________ Time (a.m./p.m.): _______________ Notes: ___________________________________ Managing blood glucose  Check your blood glucose levels using a glucose monitor as told by your health care provider. Write down the times that you check your glucose levels here: Time: _______________ Notes: ___________________________________ Time: _______________ Notes: ___________________________________ Time: _______________ Notes: ___________________________________ Time: _______________ Notes: ___________________________________ Time: _______________ Notes: ___________________________________ Time: _______________ Notes: ___________________________________  Low blood glucose Low blood glucose (hypoglycemia) is when glucose is at or below 70 mg/dL (3.9 mmol/L). Symptoms may include: Feeling: Hungry. Sweaty and clammy. Irritable or easily upset. Dizzy. Sleepy. Having: A fast heartbeat. A headache. A change in your vision. Numbness around the mouth, lips, or  tongue. Having trouble with: Moving (coordination). Sleeping. Treating low blood glucose To treat low blood glucose, eat or drink something containing sugar right away. If you can think clearly and swallow safely, follow the 15:15 rule: Take 15 grams of a fast-acting carb (carbohydrate), as told by your health care provider. Some fast-acting carbs are: Glucose tablets: take 3-4 tablets. Hard candy: eat 3-5 pieces. Fruit juice: drink 4 oz (120 mL). Regular (not diet) soda: drink 4-6 oz (120-180 mL). Honey or sugar: eat 1 Tbsp (15 mL). Check your blood glucose levels 15 minutes after you take the carb. If your glucose is still at or below 70 mg/dL (3.9 mmol/L),  take 15 grams of a carb again. If your glucose does not go above 70 mg/dL (3.9 mmol/L) after 3 tries, get help right away. After your glucose goes back to normal, eat a meal or a snack within 1 hour. Treating very low blood glucose If your glucose is at or below 54 mg/dL (3 mmol/L), you have very low blood glucose (severe hypoglycemia). This is an emergency. Do not wait to see if the symptoms will go away. Get medical help right away. Call your local emergency services (911 in the [place] Do not drive yourself to the hospital. Questions to ask your health care provider Should I talk with a diabetes educator? What equipment will I need to care for myself at home? What diabetes medicines do I need? When should I take them? How often do I need to check my blood glucose levels? What number can I call if I have questions? When is my follow-up visit? Where can I find a support group for people with diabetes? Where to find more information American Diabetes Association: www.diabetes.org Association of Diabetes Care and Education Specialists: www.diabeteseducator.org Contact a health care provider if: Your blood glucose is at or above 240 mg/dL (40.9 mmol/L) for 2 days in a row. You have been sick or have had a fever for 2 days or more,  and you are not getting better. You have any of these problems for more than 6 hours: You cannot eat or drink. You feel nauseous. You vomit. You have diarrhea. Get help right away if: Your blood glucose is lower than 54 mg/dL (3 mmol/L). You get confused. You have trouble thinking clearly. You have trouble breathing. These symptoms may represent a serious problem that is an emergency. Do not wait to see if the symptoms will go away. Get medical help right away. Call your local emergency services (911 in the [place] Do not drive yourself to the hospital. Summary Diabetes mellitus is a chronic disease that occurs when the body does not properly use sugar (glucose) that is released from food after you eat. Take insulin and diabetes medicines as told. Check your blood glucose every day, as often as told. Keep all follow-up visits. This is important. This information is not intended to replace advice given to you by your health care provider. Make sure you discuss any questions you have with your health care provider. Document Revised: 08/27/2019 Document Reviewed: 08/27/2019 Elsevier Patient Education  2024 [employer].

## 2022-11-18 NOTE — Progress Notes (Signed)
BP 136/74 (BP Location: Left Arm, Patient Position: Sitting, Cuff Size: Large)   Pulse 78   Temp 98.3 F (36.8 C) (Oral)   Ht 5' 7.99" (1.727 m)   Wt 260 lb 6.4 oz (118.1 kg)   SpO2 96%   BMI 39.60 kg/m    Subjective:    Patient ID: Rodney Chary [name], male    DOB: 1960-12-22, 62 y.o.   MRN: [id number]  HPI: Anderson Lepera. is a 62 y.o. male  Chief Complaint  Patient presents with   Recurrent Skin Infections    Under right arm for past week   SKIN INFECTION Under right axilla and to upper back, recurrent in nature.  Current has been present over past week. Has underlying diabetes and recent A1c 11.7% due to poor adherence to regimen. Duration: days Location: under right axilla and upper right back History of trauma in area: no Pain: no Quality: no Severity: none Redness: yes Swelling: yes Oozing: yes -- wife drained yellow fluid out of one Pus: no Fevers: no Nausea/vomiting: no Status: stable Treatments attempted: triple abx  Tetanus: UTD   Relevant past medical, surgical, family and social history reviewed and updated as indicated. Interim medical history since our last visit reviewed. Allergies and medications reviewed and updated.  Review of Systems  Constitutional:  Negative for activity change, diaphoresis, fatigue and fever.  Respiratory:  Negative for cough, chest tightness, shortness of breath and wheezing.   Cardiovascular:  Negative for chest pain, palpitations and leg swelling.  Gastrointestinal: Negative.   Endocrine: Negative for polydipsia, polyphagia and polyuria.  Genitourinary:  Negative for urgency.  Skin:  Positive for wound.  Neurological:  Negative for dizziness, facial asymmetry, speech difficulty, weakness and headaches.  Psychiatric/Behavioral: Negative.      Per HPI unless specifically indicated above     Objective:    BP 136/74 (BP Location: Left Arm, Patient Position: Sitting, Cuff Size: Large)   Pulse 78   Temp 98.3 F  (36.8 C) (Oral)   Ht 5' 7.99" (1.727 m)   Wt 260 lb 6.4 oz (118.1 kg)   SpO2 96%   BMI 39.60 kg/m   Wt Readings from Last 3 Encounters:  11/18/22 260 lb 6.4 oz (118.1 kg)  10/28/22 261 lb 9.6 oz (118.7 kg)  10/07/22 261 lb (118.4 kg)    Physical Exam Vitals and nursing note reviewed.  Constitutional:      General: He is awake. He is not in acute distress.    Appearance: He is well-developed and well-groomed. He is morbidly obese. He is not ill-appearing.  HENT:     Head: Normocephalic and atraumatic.     Right Ear: Hearing normal. No drainage.     Left Ear: Hearing normal. No drainage.  Eyes:     General: Lids are normal.        Right eye: No discharge.        Left eye: No discharge.     Conjunctiva/sclera: Conjunctivae normal.     Pupils: Pupils are equal, round, and reactive to light.  Neck:     Thyroid: No thyromegaly.     Vascular: No carotid bruit or JVD.  Cardiovascular:     Rate and Rhythm: Normal rate and regular rhythm.     Heart sounds: Normal heart sounds, S1 normal and S2 normal. No murmur heard.    No gallop.  Pulmonary:     Effort: Pulmonary effort is normal. No accessory muscle usage or respiratory distress.  Breath sounds: Normal breath sounds.  Abdominal:     General: Bowel sounds are normal.     Palpations: Abdomen is soft.  Musculoskeletal:        General: Normal range of motion.     Right shoulder: Normal.     Left shoulder: Normal.     Cervical back: Normal range of motion and neck supple.     Right lower leg: No edema.     Left lower leg: No edema.  Skin:    General: Skin is warm and dry.     Capillary Refill: Capillary refill takes less than 2 seconds.     Findings: Abscess present. No rash.     Comments: Two abscesses under right axilla, upper aspect.  One with moderate erythema and mild tenderness, warm to touch.  No drainage.  The second is smaller with no erythema or drainage.   Abscess on right upper back with moderate erythema and  crusting to top.  Warm to touch.  No tenderness.    Neurological:     Mental Status: He is alert and oriented to person, place, and time.     Cranial Nerves: Cranial nerves 2-12 are intact.     Motor: Motor function is intact.     Coordination: Coordination is intact.     Gait: Gait is intact.     Deep Tendon Reflexes: Reflexes are normal and symmetric.  Psychiatric:        Attention and Perception: Attention normal.        Mood and Affect: Mood normal.        Speech: Speech normal.        Behavior: Behavior normal. Behavior is cooperative.        Thought Content: Thought content normal.     Results for orders placed or performed in visit on 11/14/22  CoaguChek XS/INR Waived (STAT)  Result Value Ref Range   INR 2.0 (H) 0.9 - 1.1   Prothrombin Time 24.3 sec      Assessment & Plan:   Problem List Items Addressed This Visit       Musculoskeletal and Integument   Skin abscess - Primary    Multiple, x 3 present = 2 under right axilla and 1 upper right back.  No draining today, diabetic.  Discussed with him.  Recommend warm compresses at home and will start Doxycycline 100 MG BID + Mupirocin for possible MRSA.  Educated him on this.  He takes Coumadin, recent INR under goal at 2.  Recommend he reduce Coumadin to 7 MG, currently taking 8 MG, daily while on Coumadin due to interaction risk.  Educated him on this, if any bleeding to immediately hold Coumadin and alert provider or go to ER.        Follow up plan: Return in about 1 week (around 11/25/2022) for Abscesses and INR check.

## 2022-11-22 ENCOUNTER — Other Ambulatory Visit: Payer: Self-pay

## 2022-11-24 NOTE — Progress Notes (Unsigned)
   11/24/2022  Patient ID: Rodney Chary [name], male   DOB: Nov 27, 1960, 62 y.o.   MRN: [id number]  S/O Telephone visit to get Rodney Dixon new insurance information and check coverage for his medications  Medication Access/Adherence -Contacted [facility], and they have his new insurance information -Ozempic is not covered on the plan; Trulicity is the preferred GLP1.  Rodney Dixon has used Trulicity before and is averse to the injection technique.  Even being the preferred product on his plan, this would still cost $932/month; and a copay card would only save $150/month. -Patient currently has Ozempic sample provided by [facility] and is taking 0.5mg  weekly -[place] Pepper is a preferred product on his plan; but even on the insurance, it would be $583/month.  A copay card would save $175/month -Plan representative states Novolin N Flexpen is $93.58, but a copay card should take this to $35/month -Diabetic testing supplies are also covered, but the test strips alone are $149.6 for a box of 100 -Rodney Dixon has a $4500 deductible for the year; once this is met he will pay 40% coinsurance for brand medications -Insurance representative states there is also a monthly premium that has not been paid, so patient responsibility is %100 until it has been paid for  A/P  Medication Access/Adherence -Patient contacting insurance to check on premium b/c he did not think he had one.  He will also see if once paid, if copay/coinsurance amounts will decrease -I encouraged him to discuss other plan options, also; because it is unlikely this deductible will be met before EOY.  Brand-name medications would still be very costly even once the deductible is met, and patient paying %40 coinsurance.  Follow-up:  Rodney Dixon will call me Friday after speaking with his insurance plan  Rodney Dixon, PharmD, DPLA

## 2022-11-27 NOTE — Patient Instructions (Signed)
Skin Abscess  A skin abscess is an infected spot of skin. It can have pus in it. An abscess can happen in any part of your body. Some abscesses break open (rupture) on their own. Most keep getting worse unless they are treated. If your abscess is not treated, the infection can spread deeper into your body and blood. This can make you feel sick. What are the causes? Germs that enter your skin. This may happen if you have: A cut or scrape. A wound from a needle or an insect bite. Blocked oil or sweat glands. A problem with the spot where your hair goes into your skin. A fluid-filled sac called a cyst under your skin. What increases the risk? Having problems with how your blood moves through your body. Having a weak body defense system (immune system). Having diabetes. Having dry and irritated skin. Needing to get shots often. Putting drugs into your body with a needle. Having a splinter or something else in your skin. Smoking. What are the signs or symptoms? A firm bump under your skin that hurts. A bump with pus at the top. Redness and swelling. Warm or tender spots. A sore on the skin. How is this treated? You may need to: Put a heat pack or a warm, wet washcloth on the spot. Have the pus drained. Take antibiotics. Follow these instructions at home: Medicines Take over-the-counter and prescription medicines only as told by your doctor. If you were prescribed antibiotics, take them as told by your doctor. Do not stop taking them even if you start to feel better. Abscess care  If you have an abscess that has not drained, put heat on it. Use the heat source that your doctor recommends, such as a moist heat pack or a heating pad. Place a towel between your skin and the heat source. Leave the heat on for 20-30 minutes. If your skin turns bright red, take off the heat right away to prevent burns. The risk of burns is higher if you cannot feel pain, heat, or cold. Follow  instructions from your doctor about how to take care of your abscess. Make sure you: Cover the abscess with a bandage. Wash your hands with soap and water for at least 20 seconds before and after you change your bandage. If you cannot use soap and water, use hand sanitizer. Change your bandage as told by your doctor. Check your abscess every day for signs that the infection is getting worse. Check for: More redness, swelling, or pain. More fluid or blood. Warmth. More pus or a worse smell. General instructions To keep the infection from spreading: Do not share personal items or towels. Do not go in a hot tub with others. Avoid making skin contact with others. Be careful when you get rid of used bandages or any pus from the abscess. Do not smoke or use any products that contain nicotine or tobacco. If you need help quitting, ask your doctor. Contact a doctor if: You see red streaks on your skin near the abscess. You have any signs of worse infection. You vomit every time you eat or drink. You have a fever, chills, or muscle aches. The cyst or abscess comes back. Get help right away if: You have very bad pain. You make less pee (urine) than normal. This information is not intended to replace advice given to you by your health care provider. Make sure you discuss any questions you have with your health care provider. Document Revised: 12/08/2021  Document Reviewed: 12/08/2021 Elsevier Patient Education  2024 [employer].

## 2022-11-28 ENCOUNTER — Telehealth: Payer: Self-pay

## 2022-11-28 ENCOUNTER — Encounter: Payer: Self-pay | Admitting: Nurse Practitioner

## 2022-11-28 ENCOUNTER — Ambulatory Visit: Payer: 59 | Admitting: Nurse Practitioner

## 2022-11-28 VITALS — BP 132/72 | HR 83 | Temp 97.9°F | Ht 67.99 in | Wt 258.8 lb

## 2022-11-28 DIAGNOSIS — L02818 Cutaneous abscess of other sites: Secondary | ICD-10-CM | POA: Diagnosis not present

## 2022-11-28 DIAGNOSIS — Z7901 Long term (current) use of anticoagulants: Secondary | ICD-10-CM

## 2022-11-28 DIAGNOSIS — Z952 Presence of prosthetic heart valve: Secondary | ICD-10-CM | POA: Diagnosis not present

## 2022-11-28 LAB — COAGUCHEK XS/INR WAIVED
INR: 2.6 — ABNORMAL HIGH (ref 0.9–1.1)
Prothrombin Time: 31.2 s

## 2022-11-28 NOTE — Assessment & Plan Note (Signed)
Chronic, ongoing with Coumadin use.  INR today 2.6, at 2.5 to 3.5 goal. Not to take Ibuprofen due to risk for bleeding with this and only use Tylenol or OTC Voltaren gel or Icy/Hot patches.  Remain off Fenofibrate. Elected to continue Coumadin 7.5 MG daily at this time.  Return INR in 2 weeks. Return sooner to office if any bleeding or concerns.

## 2022-11-28 NOTE — Progress Notes (Unsigned)
   11/28/2022  Patient ID: Rodney Chary [name], male   DOB: Sep 17, 1960, 62 y.o.   MRN: [id number]  S/O  Returning Rodney Dixon call from Friday.  My system was down from [employer], so I was not available when he tried to reach out to follow-up with his insurance plan information.  Medication Access/Adherence -Rodney Dixon and wife, Sedalia Muta, called insurance and were able to determine that he does have a monthly premium.  They state that they started talked about other plan options, though; and neve found out what his premium is. -They did discuss a BCBS EPO Blue Home plan where generics would be no cost, and other brand medications would be 50% coinsurance -We discussed Medicaid eligibility previously, but I did revisit that.  Based on wife's income, they are just above the HHI cutoff.  Diane states they applied a few months ago, and he was denied. -There are currently no diabetes disease state grants open to assist with medication copays  A/P  Medication Access/Adherence -Based on current plan, patient likely will not be able to continue treatment with GLP1 or SGLT2, so we discussed generic alternatives.  Rodney Dixon is not sure what he has tried in the past except for metformin, and that was stopped due to decline in kidney function that resolved once metformin was stopped. -He has 1 box left of insulin, and I will get a copay card and give to pharmacy to make this $35/month -He will likely be best off getting diabetic testing supplies OTC:  Embrace meter can be purchased for approximately $10, and #100 strips and lancets would be around $15 -Medication alternatives that would be more cost-effective for Rodney Dixon include:  increasing/changing daily insulin regimen, as this will be $35/month regardless of dose/amount; adding on a sulfonylurea or insulin sensitizer like pioglitazone.  Sulfonylureas- while effective in decreasing A1c- come with risks including weight gain and hypoglycemia.  Insulin sensitizers may cause fluid retention, heart failure, weight gain, and bone fracture; but they may also improve lipids, decrease chance of MI, and reduce A1c 0.5-1.4%  Follow-up:  Will consult Jolene on generic alternatives to determine diabetes management plan moving forward  Lenna Gilford, PharmD, DPLA

## 2022-11-28 NOTE — Assessment & Plan Note (Addendum)
Multiple, x 3 present = 2 under right axilla and 1 upper right back.  Areas are improving and have drained on own.  Recommend warm compresses at home and continue Mupirocin for possible MRSA to the upper back area.  Educated him on this.

## 2022-11-28 NOTE — Progress Notes (Signed)
   BP 132/72 (BP Location: Left Arm, Patient Position: Sitting, Cuff Size: Large)   Pulse 83   Temp 97.9 F (36.6 C) (Oral)   Ht 5' 7.99" (1.727 m)   Wt 258 lb 12.8 oz (117.4 kg)   SpO2 94%   BMI 39.36 kg/m    Subjective:    Patient ID: Rodney Chary [name], male    DOB: 10-03-1960, 62 y.o.   MRN: [id number]  CC: Coumadin management and abscess follow-up  HPI: This patient is a 62 y.o. male who presents for coumadin management. The expected duration of coumadin treatment is lifelong. The reason for anticoagulation is  mechanical heart valve. Follows with cardiology, Dr. Darrold Junker, last on 09/14/2022, no changes.  Echo 03/13/2020 with LVEF greater then 55%, stable valve.  Recently placed on abx therapy due to abscesses and reduced Coumadin at the time, although he has been taking 7.5 MG daily.  He reports abscesses have improved and are gone.    Present Coumadin dose: 7.5 MG daily Goal: 2.5-3.5 The patient does not have an active anticoagulation episode. Excessive bruising: no Nose bleeding: no Rectal bleeding: no Eating diet with consistent amounts of foods containing Vitamin K:no Any recent antibiotic use? no  Relevant past medical, surgical, family and social history reviewed and updated as indicated. Interim medical history since our last visit reviewed. Allergies and medications reviewed and updated.  ROS: Per HPI unless specifically indicated above     Objective:    BP 132/72 (BP Location: Left Arm, Patient Position: Sitting, Cuff Size: Large)   Pulse 83   Temp 97.9 F (36.6 C) (Oral)   Ht 5' 7.99" (1.727 m)   Wt 258 lb 12.8 oz (117.4 kg)   SpO2 94%   BMI 39.36 kg/m   Wt Readings from Last 3 Encounters:  11/28/22 258 lb 12.8 oz (117.4 kg)  11/18/22 260 lb 6.4 oz (118.1 kg)  10/28/22 261 lb 9.6 oz (118.7 kg)    General: Well appearing, well nourished in no distress.  Normal mood and affect. Skin: No excessive bruising or rash -- abscesses have improved  Last INR:  2.0  Last CBC:  Lab Results  Component Value Date   WBC 9.9 04/11/2022   HGB 16.1 04/11/2022   HCT 50.0 04/11/2022   MCV 87.1 04/11/2022   PLT 191 04/11/2022    Results for orders placed or performed in visit on 11/28/22  CoaguChek XS/INR Waived  Result Value Ref Range   INR 2.6 (H) 0.9 - 1.1   Prothrombin Time 31.2 sec    Assessment:     ICD-10-CM   1. History of mechanical aortic valve replacement  Z95.2 CoaguChek XS/INR Waived    2. Current use of long term anticoagulation  Z79.01 CoaguChek XS/INR Waived    3. Cutaneous abscess of other site  L02.818       Plan:   Discussed current plan face-to-face with patient. For coumadin dosing, will continue 7.5 MG daily at this time.  Will plan to recheck INR in 2 weeks.

## 2022-11-28 NOTE — Assessment & Plan Note (Signed)
Refer to mechanical valve plan of care. 

## 2022-11-29 NOTE — Progress Notes (Signed)
   11/29/2022  Patient ID: Rodney Chary [name], male   DOB: 16-Dec-1960, 62 y.o.   MRN: [id number]  Upon going to obtain copay card for patient's Novolin N, I discovered [facility] does not offer for this particular insulin.  Copay cards are available for Novolog Mix 70/30, Tresiba (insulin degludec), and insulin aspart.  For adherence purposes, Rodney Dixon may be best for this patient versus insulin requiring multiple daily dosing.  Lenna Gilford, PharmD, DPLA

## 2022-11-30 ENCOUNTER — Ambulatory Visit: Payer: 59 | Admitting: Nurse Practitioner

## 2022-11-30 ENCOUNTER — Telehealth: Payer: Self-pay

## 2022-11-30 NOTE — Progress Notes (Signed)
   11/30/2022  Patient ID: Rodney Chary [name], male   DOB: 1960-06-05, 62 y.o.   MRN: [id number]  Patient outreach to inform Rodney Dixon that PCP, Rodney Dixon, has been made aware of current insurance formulary, cost of current medications, and generic alternatives covered by his plan.  She will discuss diabetes management plan with him at his upcoming visit.  I also discussed alternative testing supplies that would be cheaper to buy outright OTC versus through insurance as a prescription.  He already has a meter that he buys supplies for OTC at [facility].  I also made sure patient has medications to last until he sees Rodney Dixon 8/6.  He is down to 1 box of insulin, but based on day supply this will last at least 3 more weeks.  Rodney Dixon to reach out to me after their appointment if I can assist or follow-up with Rodney Dixon.  Lenna Gilford, PharmD, DPLA

## 2022-12-05 ENCOUNTER — Other Ambulatory Visit: Payer: Self-pay | Admitting: Nurse Practitioner

## 2022-12-06 NOTE — Telephone Encounter (Signed)
Requested Prescriptions  Pending Prescriptions Disp Refills   Insulin NPH, Human,, Isophane, (NOVOLIN N FLEXPEN) 100 UNIT/ML [place] Med Name: NOVOLIN N 100 UNIT/ML FLEXPEN] 15 mL 0    Sig: INJECT 50 UNITS INTO THE SKIN EVERY MORNING.     Endocrinology:  Diabetes - Insulins Failed - 12/05/2022  6:08 PM      Failed - HBA1C is between 0 and 7.9 and within 180 days    Hemoglobin A1C  Date Value Ref Range Status  12/16/2015 10.4  Final   HB A1C (BAYER DCA - WAIVED)  Date Value Ref Range Status  08/19/2022 11.7 (H) 4.8 - 5.6 % Final    Comment:             Prediabetes: 5.7 - 6.4          Diabetes: >6.4          Glycemic control for adults with diabetes: <7.0          Passed - Valid encounter within last 6 months    Recent Outpatient Visits           1 week ago History of mechanical aortic valve replacement   [place] [facility] [place], Corrie Dandy T, NP   2 weeks ago Cutaneous abscess of other site   [place] [facility] [place], [place] T, NP   1 month ago History of mechanical aortic valve replacement   [place] [facility] [place], [place] T, NP   2 months ago Acute midline low back pain with left-sided sciatica   [place] [facility] [place], Oswaldo Conroy, PA-C   2 months ago History of mechanical aortic valve replacement   [place] [facility] [place], [name], NP       Future Appointments             In 1 week [place], [name], NP  [facility], [facility]

## 2022-12-12 ENCOUNTER — Ambulatory Visit: Payer: Self-pay | Admitting: Nurse Practitioner

## 2022-12-12 ENCOUNTER — Telehealth: Payer: Self-pay | Admitting: Nurse Practitioner

## 2022-12-12 NOTE — Telephone Encounter (Unsigned)
Copied from CRM [id number]. Topic: General - Other >> Dec 12, 2022  1:41 PM Franchot Heidelberg wrote: Reason for CRM: [employer] called reporting that they need to speak to the clinic regarding appt/insurance information. Please advise >> Dec 12, 2022  1:44 PM Franchot Heidelberg wrote: Ellene Route number : [phone] >> Dec 12, 2022  1:43 PM Franchot Heidelberg wrote: [phone] Kennith Gain from [place] billing

## 2022-12-13 ENCOUNTER — Ambulatory Visit: Payer: 59 | Admitting: Nurse Practitioner

## 2022-12-13 DIAGNOSIS — E785 Hyperlipidemia, unspecified: Secondary | ICD-10-CM

## 2022-12-13 DIAGNOSIS — Z8673 Personal history of transient ischemic attack (TIA), and cerebral infarction without residual deficits: Secondary | ICD-10-CM

## 2022-12-13 DIAGNOSIS — E1122 Type 2 diabetes mellitus with diabetic chronic kidney disease: Secondary | ICD-10-CM

## 2022-12-13 DIAGNOSIS — Z952 Presence of prosthetic heart valve: Secondary | ICD-10-CM

## 2022-12-13 DIAGNOSIS — E1142 Type 2 diabetes mellitus with diabetic polyneuropathy: Secondary | ICD-10-CM

## 2022-12-13 DIAGNOSIS — D6869 Other thrombophilia: Secondary | ICD-10-CM

## 2022-12-13 DIAGNOSIS — Z794 Long term (current) use of insulin: Secondary | ICD-10-CM

## 2022-12-13 DIAGNOSIS — Z91148 Patient's other noncompliance with medication regimen for other reason: Secondary | ICD-10-CM

## 2022-12-13 DIAGNOSIS — Z7901 Long term (current) use of anticoagulants: Secondary | ICD-10-CM

## 2022-12-13 DIAGNOSIS — E1159 Type 2 diabetes mellitus with other circulatory complications: Secondary | ICD-10-CM

## 2022-12-13 NOTE — Telephone Encounter (Signed)
Called and spoke with [facility]. Provided the representative with the patient's member ID number on their insurance card. Representative states that the insurance is still not going through for the patient. Patient will be billed for the labs per the representative.

## 2022-12-14 ENCOUNTER — Telehealth: Payer: Self-pay | Admitting: Nurse Practitioner

## 2022-12-14 ENCOUNTER — Telehealth: Payer: Self-pay

## 2022-12-14 NOTE — Progress Notes (Signed)
   12/14/2022  Patient ID: Rodney Chary [name], male   DOB: 02/23/1961, 62 y.o.   MRN: [id number] S/O Patient outreach in reponse to clinic routed requtes stating Rodney Dixon called [facility] asking for a call back from me today.  Medication Access/Adherence -Patient is asking about the $35 cap on the cost of insulin -States he was told by [facility] today that he no longer has insurance coverage again -He is not without any of his medications at this time, and the pharmacy was able to bill to his insurance this week  Medication Access/Adherence -Suggested that he call his plan to check on coverage status -We discussed eligiblity for [facility], and he would qualify if he no longer has coverage  Follow-up:  I will call him Friday to check on insurance coverage- if none, will assist in applying for [facility].  Lenna Gilford, PharmD, DPLA

## 2022-12-14 NOTE — Telephone Encounter (Unsigned)
Copied from CRM [id number]. Topic: General - Other >> Dec 14, 2022 12:49 PM [name] wrote: Reason for CRM: Pt is calling wanting a message to be sent to the office for Rodney Dixon to give him a call back. Pt did not disclose why he need to speak with Rodney Dixon.

## 2022-12-15 ENCOUNTER — Other Ambulatory Visit: Payer: Self-pay

## 2022-12-16 ENCOUNTER — Other Ambulatory Visit: Payer: Self-pay

## 2022-12-16 NOTE — Progress Notes (Unsigned)
   12/16/2022  Patient ID: Rodney Chary [name], male   DOB: Nov 22, 1960, 62 y.o.   MRN: [id number]  Telephone follow-up to check on insurance status.  -Patient does NOT have active insurance -He has applied for Medicaid and been denied since beginning of 2024, but we did review his eligibility during the call.  Total household income does exceed the maximum allowed for Medicaid, so he would not qualify. -We discussed [place] [facility] free pharmacy program, and he would qualify for this.  I am contacting the program to mail an application to his home- verified his mailing address.   -I am forwarding my note to LCSW to see if there are known resources to assist with other medical care/needs outside of prescriptions since he likely won't be able to afford office visits out of pocket -Will follow-up with him in 7-10 days to make sure application was received.  I have also given my direct number, so he can contact me if assistance completing the application is needed.  Lenna Gilford, PharmD, DPLA

## 2022-12-21 ENCOUNTER — Other Ambulatory Visit: Payer: Self-pay | Admitting: Nurse Practitioner

## 2022-12-23 NOTE — Telephone Encounter (Signed)
Requested Prescriptions  Pending Prescriptions Disp Refills   Insulin NPH, Human,, Isophane, (NOVOLIN N FLEXPEN) 100 UNIT/ML [place] Med Name: NOVOLIN N 100 UNIT/ML FLEXPEN] 15 mL 0    Sig: INJECT 50 UNITS INTO THE SKIN EVERY MORNING.     Endocrinology:  Diabetes - Insulins Failed - 12/21/2022  6:29 PM      Failed - HBA1C is between 0 and 7.9 and within 180 days    Hemoglobin A1C  Date Value Ref Range Status  12/16/2015 10.4  Final   HB A1C (BAYER DCA - WAIVED)  Date Value Ref Range Status  08/19/2022 11.7 (H) 4.8 - 5.6 % Final    Comment:             Prediabetes: 5.7 - 6.4          Diabetes: >6.4          Glycemic control for adults with diabetes: <7.0          Passed - Valid encounter within last 6 months    Recent Outpatient Visits           3 weeks ago History of mechanical aortic valve replacement   [place] [facility] [place], Corrie Dandy T, NP   1 month ago Cutaneous abscess of other site   [facility] [place], [place] T, NP   1 month ago History of mechanical aortic valve replacement   [place] [facility] [place], Vander[place] T, NP   2 months ago Acute midline low back pain with left-sided sciatica   [place] [facility] Mecum, Erin E, PA-C   2 months ago History of mechanical aortic valve replacement   [place] [facility] [place], [name], NP

## 2022-12-28 ENCOUNTER — Telehealth: Payer: Self-pay

## 2022-12-28 ENCOUNTER — Other Ambulatory Visit: Payer: Self-pay

## 2022-12-28 NOTE — Progress Notes (Signed)
   12/28/2022  Patient ID: Rodney Chary [name], male   DOB: [birth year], 62 y.o.   MRN: [id number]  Outreach attempt to see if [name] received the [facility] application I requested on his behalf and also provide some resources for medical care for patients without insurance.  I was not able to reach him or leave a voicemail, but I will try to check in again later this week.  Lenna Gilford, PharmD, DPLA

## 2022-12-28 NOTE — Progress Notes (Signed)
   12/28/2022  Patient ID: Rodney Chary [name], male   DOB: 06-09-1960, 62 y.o.   MRN: [id number]  Mr. Klingsporn returned my call from earlier.  -He has received the [place] [facility] Application and plans to complete and submit via email tomorrow -He currently has all medications on hand and endorses taking as prescribed -I provided him with the number to the [facility] and suggested he call for an appointment since he is supposed to be having a follow-up with Jolene 9/4 and likely will not be able to go due to lack of insurance -I am going to follow-up with case manager to get additional charity care information to share with him -Advised him to call me if he starts to run low on any medications or needs assistance with Med Assist application  Will follow-up in 2 weeks to see if he has heard from program.  Lenna Gilford, PharmD, DPLA

## 2022-12-30 ENCOUNTER — Other Ambulatory Visit: Payer: Self-pay

## 2023-01-09 NOTE — Patient Instructions (Incomplete)
Diabetes Mellitus Basics  Diabetes mellitus, or diabetes, is a long-term (chronic) disease. It occurs when the body does not properly use sugar (glucose) that is released from food after you eat. Diabetes mellitus may be caused by one or both of these problems: Your pancreas does not make enough of a hormone called insulin. Your body does not react in a normal way to the insulin that it makes. Insulin lets glucose enter cells in your body. This gives you energy. If you have diabetes, glucose cannot get into cells. This causes high blood glucose (hyperglycemia). How to treat and manage diabetes You may need to take insulin or other diabetes medicines daily to keep your glucose in balance. If you are prescribed insulin, you will learn how to give yourself insulin by injection. You may need to adjust the amount of insulin you take based on the foods that you eat. You will need to check your blood glucose levels using a glucose monitor as told by your health care provider. The readings can help determine if you have low or high blood glucose. Generally, you should have these blood glucose levels: Before meals (preprandial): 80-130 mg/dL (4.4-7.2 mmol/L). After meals (postprandial): below 180 mg/dL (10 mmol/L). Hemoglobin A1c (HbA1c) level: less than 7%. Your health care provider will set treatment goals for you. Keep all follow-up visits. This is important. Follow these instructions at home: Diabetes medicines Take your diabetes medicines every day as told by your health care provider. List your diabetes medicines here: Name of medicine: ______________________________ Amount (dose): _______________ Time (a.m./p.m.): _______________ Notes: ___________________________________ Name of medicine: ______________________________ Amount (dose): _______________ Time (a.m./p.m.): _______________ Notes: ___________________________________ Name of medicine: ______________________________ Amount (dose):  _______________ Time (a.m./p.m.): _______________ Notes: ___________________________________ Insulin If you use insulin, list the types of insulin you use here: Insulin type: ______________________________ Amount (dose): _______________ Time (a.m./p.m.): _______________Notes: ___________________________________ Insulin type: ______________________________ Amount (dose): _______________ Time (a.m./p.m.): _______________ Notes: ___________________________________ Insulin type: ______________________________ Amount (dose): _______________ Time (a.m./p.m.): _______________ Notes: ___________________________________ Insulin type: ______________________________ Amount (dose): _______________ Time (a.m./p.m.): _______________ Notes: ___________________________________ Insulin type: ______________________________ Amount (dose): _______________ Time (a.m./p.m.): _______________ Notes: ___________________________________ Managing blood glucose  Check your blood glucose levels using a glucose monitor as told by your health care provider. Write down the times that you check your glucose levels here: Time: _______________ Notes: ___________________________________ Time: _______________ Notes: ___________________________________ Time: _______________ Notes: ___________________________________ Time: _______________ Notes: ___________________________________ Time: _______________ Notes: ___________________________________ Time: _______________ Notes: ___________________________________  Low blood glucose Low blood glucose (hypoglycemia) is when glucose is at or below 70 mg/dL (3.9 mmol/L). Symptoms may include: Feeling: Hungry. Sweaty and clammy. Irritable or easily upset. Dizzy. Sleepy. Having: A fast heartbeat. A headache. A change in your vision. Numbness around the mouth, lips, or tongue. Having trouble with: Moving (coordination). Sleeping. Treating low blood glucose To treat low blood  glucose, eat or drink something containing sugar right away. If you can think clearly and swallow safely, follow the 15:15 rule: Take 15 grams of a fast-acting carb (carbohydrate), as told by your health care provider. Some fast-acting carbs are: Glucose tablets: take 3-4 tablets. Hard candy: eat 3-5 pieces. Fruit juice: drink 4 oz (120 mL). Regular (not diet) soda: drink 4-6 oz (120-180 mL). Honey or sugar: eat 1 Tbsp (15 mL). Check your blood glucose levels 15 minutes after you take the carb. If your glucose is still at or below 70 mg/dL (3.9 mmol/L), take 15 grams of a carb again. If your glucose does not go above 70 mg/dL (3.9 mmol/L) after   3 tries, get help right away. After your glucose goes back to normal, eat a meal or a snack within 1 hour. Treating very low blood glucose If your glucose is at or below 54 mg/dL (3 mmol/L), you have very low blood glucose (severe hypoglycemia). This is an emergency. Do not wait to see if the symptoms will go away. Get medical help right away. Call your local emergency services (911 in the [place] Do not drive yourself to the hospital. Questions to ask your health care provider Should I talk with a diabetes educator? What equipment will I need to care for myself at home? What diabetes medicines do I need? When should I take them? How often do I need to check my blood glucose levels? What number can I call if I have questions? When is my follow-up visit? Where can I find a support group for people with diabetes? Where to find more information American Diabetes Association: www.diabetes.org Association of Diabetes Care and Education Specialists: www.diabeteseducator.org Contact a health care provider if: Your blood glucose is at or above 240 mg/dL (13.3 mmol/L) for 2 days in a row. You have been sick or have had a fever for 2 days or more, and you are not getting better. You have any of these problems for more than 6 hours: You cannot eat or  drink. You feel nauseous. You vomit. You have diarrhea. Get help right away if: Your blood glucose is lower than 54 mg/dL (3 mmol/L). You get confused. You have trouble thinking clearly. You have trouble breathing. These symptoms may represent a serious problem that is an emergency. Do not wait to see if the symptoms will go away. Get medical help right away. Call your local emergency services (911 in the [place] Do not drive yourself to the hospital. Summary Diabetes mellitus is a chronic disease that occurs when the body does not properly use sugar (glucose) that is released from food after you eat. Take insulin and diabetes medicines as told. Check your blood glucose every day, as often as told. Keep all follow-up visits. This is important. This information is not intended to replace advice given to you by your health care provider. Make sure you discuss any questions you have with your health care provider. Document Revised: 08/27/2019 Document Reviewed: 08/27/2019 Elsevier Patient Education  2024 Elsevier Inc.  

## 2023-01-11 ENCOUNTER — Encounter: Payer: Self-pay | Admitting: Nurse Practitioner

## 2023-01-11 ENCOUNTER — Other Ambulatory Visit: Payer: Self-pay

## 2023-01-11 ENCOUNTER — Ambulatory Visit (INDEPENDENT_AMBULATORY_CARE_PROVIDER_SITE_OTHER): Payer: Self-pay | Admitting: Nurse Practitioner

## 2023-01-11 VITALS — BP 132/78 | HR 76 | Temp 97.6°F | Ht 68.0 in | Wt 260.0 lb

## 2023-01-11 DIAGNOSIS — I482 Chronic atrial fibrillation, unspecified: Secondary | ICD-10-CM

## 2023-01-11 DIAGNOSIS — Z794 Long term (current) use of insulin: Secondary | ICD-10-CM | POA: Diagnosis not present

## 2023-01-11 DIAGNOSIS — E1122 Type 2 diabetes mellitus with diabetic chronic kidney disease: Secondary | ICD-10-CM

## 2023-01-11 DIAGNOSIS — Z7901 Long term (current) use of anticoagulants: Secondary | ICD-10-CM

## 2023-01-11 DIAGNOSIS — E1129 Type 2 diabetes mellitus with other diabetic kidney complication: Secondary | ICD-10-CM | POA: Diagnosis not present

## 2023-01-11 DIAGNOSIS — Z2821 Immunization not carried out because of patient refusal: Secondary | ICD-10-CM

## 2023-01-11 DIAGNOSIS — E1169 Type 2 diabetes mellitus with other specified complication: Secondary | ICD-10-CM | POA: Diagnosis not present

## 2023-01-11 DIAGNOSIS — R809 Proteinuria, unspecified: Secondary | ICD-10-CM | POA: Diagnosis not present

## 2023-01-11 DIAGNOSIS — Z952 Presence of prosthetic heart valve: Secondary | ICD-10-CM

## 2023-01-11 DIAGNOSIS — N1831 Chronic kidney disease, stage 3a: Secondary | ICD-10-CM

## 2023-01-11 DIAGNOSIS — I152 Hypertension secondary to endocrine disorders: Secondary | ICD-10-CM | POA: Diagnosis not present

## 2023-01-11 DIAGNOSIS — E1142 Type 2 diabetes mellitus with diabetic polyneuropathy: Secondary | ICD-10-CM

## 2023-01-11 DIAGNOSIS — E1159 Type 2 diabetes mellitus with other circulatory complications: Secondary | ICD-10-CM | POA: Diagnosis not present

## 2023-01-11 DIAGNOSIS — E785 Hyperlipidemia, unspecified: Secondary | ICD-10-CM

## 2023-01-11 DIAGNOSIS — Z91148 Patient's other noncompliance with medication regimen for other reason: Secondary | ICD-10-CM

## 2023-01-11 LAB — COAGUCHEK XS/INR WAIVED
INR: 2.2 — ABNORMAL HIGH (ref 0.9–1.1)
Prothrombin Time: 26.4 s

## 2023-01-11 LAB — BAYER DCA HB A1C WAIVED: HB A1C (BAYER DCA - WAIVED): 11.3 % — ABNORMAL HIGH (ref 4.8–5.6)

## 2023-01-11 MED ORDER — ATORVASTATIN CALCIUM 80 MG PO TABS: 80.0000 mg | ORAL_TABLET | Freq: Every day | ORAL | 2 refills | Status: DC

## 2023-01-11 NOTE — Assessment & Plan Note (Signed)
Chronic, ongoing with ongoing A1c elevations - NPH only without insurance at this time = 11.3% today.  Urine ALB 150 April 2024.  Not adherent with diet. Is a challenging case as would benefit endocrinology, however currently without insurance.  Has spoken with [facility] and PCP.  At this time recommend he attend [facility] or [facility], which would lower OOP costs and may be able to provide endocrinology visits which is much needed.  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 - Continue NPH with increase to 55 units and continue Ozempic.  Can not afford Jardiance.  Will work with [facility] ongoing. - Continue Benazepril for kidney protection.   - Focus heavily on diabetic diet and modest weight loss, decreasing carb and sugar intake at home -- have discussed at length at visits.   - Up to date foot exam, not up to date eye exam -- recommend he obtain. - Would benefit from [facility] - Recommend he check BS 3-4 times a day and document for next visit for review.  Will continue to work with CCM team to ensure adherence to medication regimen and diet, which is a major issue.   Discussed at length his A1C goal for stroke prevention being 6.5% or less.

## 2023-01-11 NOTE — Assessment & Plan Note (Signed)
Chronic, ongoing.  Refer to diabetes plan of care for further. 

## 2023-01-11 NOTE — Assessment & Plan Note (Signed)
Chronic, ongoing with Coumadin use.  Continue collaboration with cardiology, last note reviewed.  Continue all medications.  Plan on recheck INR in 4 weeks.

## 2023-01-11 NOTE — Assessment & Plan Note (Signed)
Chronic, ongoing with Coumadin use.  INR today 2.2, slightly below 2.5 to 3.5 goal. Not to take Ibuprofen due to risk for bleeding with this and only use Tylenol or OTC Voltaren gel or Icy/Hot patches.  Remain off Fenofibrate. Elected to continue Coumadin 7.5 MG daily at this time.  Return INR in 4 weeks. Return sooner to office if any bleeding or concerns.

## 2023-01-11 NOTE — Assessment & Plan Note (Signed)
Refer to diabetes with CKD plan of care. °

## 2023-01-11 NOTE — Assessment & Plan Note (Signed)
Refer to diabetes with CKD plan of care.  Continue Gabapentin and renal dose as needed in future.

## 2023-01-11 NOTE — Assessment & Plan Note (Signed)
Chronic, ongoing.  Continue current medication regimen and adjust as needed. Lipid panel today. 

## 2023-01-11 NOTE — Assessment & Plan Note (Signed)
This is complicated by loss of insurance and no current job, which decreased ability to attend specialist visits and obtain certain medications.  At this time recommend he attend [facility] or [facility], which would lower OOP costs and may be able to provide endocrinology visits which is much needed.  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 Discussed he could always return to this PCP in future if wishes, but at this time for increased access and assistance with costs would benefit from clinics mentioned above. [facility] has also discussed these options on calls.

## 2023-01-11 NOTE — Assessment & Plan Note (Signed)
Refer to mechanical valve plan of care. 

## 2023-01-11 NOTE — Progress Notes (Unsigned)
   01/11/2023  Patient ID: Rodney Chary [name], male   DOB: [birth year], 62 y.o.   MRN: [id number]  Patient outreach to follow-up on status of [place] [facility] Application  -Patient mailed [place] [facility] application but has not heard back- I contacted the program and was told his application was denied due to having current insurance coveage -Contacted [employer] plan, and patient's coverage is active per representative -Patient was on the way to see PCP when we spoke, so I sent her a message to notify her -She also provided him with [facility] information and [facility] information through [facility]  -Follow-up call was made to patient, and he was not aware insurance was active; but he does state medication refills have continued to be affordable -He is currently not out of any of his medications -Patient going to follow-up with insurance and will let me know if I can further assist with medication access needs  Lenna Gilford, PharmD, DPLA

## 2023-01-11 NOTE — Assessment & Plan Note (Signed)
BMI 39.53 with T2DM, HTN/HLD.  Recommended eating smaller high protein, low fat meals more frequently and exercising 30 mins a day 5 times a week with a goal of 10-15lb weight loss in the next 3 months. Patient voiced their understanding and motivation to adhere to these recommendations.

## 2023-01-11 NOTE — Progress Notes (Signed)
BP 132/78   Pulse 76   Temp 97.6 F (36.4 C) (Oral)   Ht 5\' 8"  (1.727 m)   Wt 260 lb (117.9 kg)   SpO2 97%   BMI 39.53 kg/m    Subjective:    Patient ID: Rodney Chary [name], male    DOB: 1961/02/18, 62 y.o.   MRN: [id number]  HPI: Rodney Dixon. is a 62 y.o. male  Chief Complaint  Patient presents with   Diabetes    No recent eye exam per patient   Hyperlipidemia   Hypertension   Wife at bedside to assist.  DIABETES A1c April was 11.7% due to not taking medications consistently.    Saw endocrinology, Dr. Everardo All, last on 01/26/21 and to be taking current medications: Jardiance 25 MG (not taking), NPH (currently taking 60 units of this -- was on 50 units prior), Ozempic 0.5 MG (refuses Trulicity even when Ozempic not covered as needle hurts with injection -- took Ozempic 2 days ago).  Has not returned to endo, missed December 2023 visit.  Have discussed with him at all recent visits to reschedule, which he has not.  On visit 08/27/21 placed referral to Dr. Patrecia Pace locally, as patient reported it would be easier to have provider here and [facility] discharged him in past, however Dr. Patrecia Pace retired.  Currently is self pay without insurance coverage and not taking some of his ordered medications due to this -- is working with Sabino Niemann PharmD.  No current job.  He has endorsed being an "eater" + enjoying sweets.  Recently ate pizza and sugar went up to 300 range, then drank water and came down to 246.     Continues on Gabapentin, currently taking only 1200 MG before bed -- not taking day time doses. Hypoglycemic episodes: no Polydipsia/polyuria: no Visual disturbance: no Chest pain: no Paresthesias: no Glucose Monitoring: yes  Accucheck frequency: occasional 300 range   Fasting glucose:   Post prandial:  Evening:  Before meals: Taking Insulin?: yes NPH 50 units  Long acting insulin:  Short acting insulin:  Blood Pressure Monitoring: not checking Retinal  Examination: Not up to Date Foot Exam: Up to Date Pneumovax: Up to Date Influenza: Up to Date Aspirin: no   HYPERTENSION / HYPERLIPIDEMIA He is to continue Benazepril, HCTZ, Amlodipine, and Carvedilol. Continues on Atorvastatin. Last echo noted normal LV function and EF >55% 03/13/2020. Cardiology last 01/24/22 -- they continue to recommend sleep study, which he has been unable to obtain. Satisfied with current treatment? yes Duration of hypertension: chronic BP monitoring frequency: not checking BP range:  BP medication side effects: no Duration of hyperlipidemia: chronic Cholesterol medication side effects: no Cholesterol supplements: none Medication compliance: good compliance Aspirin: no Recent stressors: no Recurrent headaches: no Visual changes: no Palpitations: no Dyspnea: no Chest pain: no Lower extremity edema: no Dizzy/lightheaded: yes  CHRONIC KIDNEY DISEASE Remains stable on recent labs. CKD status: stable Medications renally dose: yes Previous renal evaluation: no Pneumovax:  Up to Date Influenza Vaccine:  Up to Date   ATRIAL FIBRILLATION Currently takes 7.5 MG daily with recent INR 2.6.  Takes this daily without missed doses  Saw neurology last on 03/30/21 for history of stroke.  Has dizziness ongoing on occasion -- CT remains reassuring on 05/12/21. Atrial fibrillation status: stable Satisfied with current treatment: yes  Medication side effects:  no Medication compliance: good compliance Etiology of atrial fibrillation: unknown Palpitations:  no Chest pain:  no Dyspnea on exertion:  no Orthopnea:  no Syncope:  no Edema:  no Ventricular rate control: B-blocker Anti-coagulation: warfarin   Relevant past medical, surgical, family and social history reviewed and updated as indicated. Interim medical history since our last visit reviewed. Allergies and medications reviewed and updated.  Review of Systems  Constitutional:  Negative for activity change,  diaphoresis, fatigue and fever.  Respiratory:  Negative for cough, chest tightness, shortness of breath and wheezing.   Cardiovascular:  Negative for chest pain, palpitations and leg swelling.  Gastrointestinal: Negative.   Endocrine: Negative for polydipsia, polyphagia and polyuria.  Genitourinary:  Negative for urgency.  Neurological:  Negative for dizziness, facial asymmetry, speech difficulty, weakness and headaches.  Psychiatric/Behavioral: Negative.      Per HPI unless specifically indicated above     Objective:    BP 132/78   Pulse 76   Temp 97.6 F (36.4 C) (Oral)   Ht 5\' 8"  (1.727 m)   Wt 260 lb (117.9 kg)   SpO2 97%   BMI 39.53 kg/m   Wt Readings from Last 3 Encounters:  01/11/23 260 lb (117.9 kg)  11/28/22 258 lb 12.8 oz (117.4 kg)  11/18/22 260 lb 6.4 oz (118.1 kg)    Physical Exam Vitals and nursing note reviewed.  Constitutional:      General: He is awake. He is not in acute distress.    Appearance: He is well-developed and well-groomed. He is morbidly obese. He is not ill-appearing.  HENT:     Head: Normocephalic and atraumatic.     Right Ear: Hearing normal. No drainage.     Left Ear: Hearing normal. No drainage.  Eyes:     General: Lids are normal.        Right eye: No discharge.        Left eye: No discharge.     Conjunctiva/sclera: Conjunctivae normal.     Pupils: Pupils are equal, round, and reactive to light.  Neck:     Thyroid: No thyromegaly.     Vascular: No carotid bruit or JVD.  Cardiovascular:     Rate and Rhythm: Normal rate and regular rhythm.     Heart sounds: Normal heart sounds, S1 normal and S2 normal. No murmur heard.    No gallop.  Pulmonary:     Effort: Pulmonary effort is normal. No accessory muscle usage or respiratory distress.     Breath sounds: Normal breath sounds.  Abdominal:     General: Bowel sounds are normal.     Palpations: Abdomen is soft.  Musculoskeletal:        General: Normal range of motion.     Right  shoulder: Normal.     Left shoulder: Normal.     Cervical back: Normal range of motion and neck supple.     Right lower leg: No edema.     Left lower leg: No edema.  Skin:    General: Skin is warm and dry.     Capillary Refill: Capillary refill takes less than 2 seconds.     Findings: No rash.  Neurological:     Mental Status: He is alert and oriented to person, place, and time.     Cranial Nerves: Cranial nerves 2-12 are intact.     Motor: Motor function is intact.     Coordination: Coordination is intact.     Gait: Gait is intact.     Deep Tendon Reflexes: Reflexes are normal and symmetric.  Psychiatric:        Attention  and Perception: Attention normal.        Mood and Affect: Mood normal.        Speech: Speech normal.        Behavior: Behavior normal. Behavior is cooperative.        Thought Content: Thought content normal.    Results for orders placed or performed in visit on 01/11/23  Bayer DCA Hb A1c Waived  Result Value Ref Range   HB A1C (BAYER DCA - WAIVED) 11.3 (H) 4.8 - 5.6 %  CoaguChek XS/INR Waived  Result Value Ref Range   INR 2.2 (H) 0.9 - 1.1   Prothrombin Time 26.4 sec      Assessment & Plan:   Problem List Items Addressed This Visit       Cardiovascular and Mediastinum   Chronic atrial fibrillation (HCC) (Chronic)    Chronic, ongoing with Coumadin use.  Continue collaboration with cardiology, last note reviewed.  Continue all medications.  Plan on recheck INR in 4 weeks.      Relevant Medications   atorvastatin (LIPITOR) 80 MG tablet   Hypertension associated with type 2 diabetes mellitus (HCC) (Chronic)    Chronic, ongoing.  BP at goal today.  Continue Benazepril, HCTZ, Coreg + Amlodipine, educated him on this medication and use + side effects.  Highly recommend he monitor BP at home at least a few mornings a week + focus on DASH diet.  Discussed with him stroke prevention goal for BP <130/80.  LABS: CMP.  Continue cardiology collaboration.  [facility]  [facility].        Relevant Medications   atorvastatin (LIPITOR) 80 MG tablet   Other Relevant Orders   Bayer DCA Hb A1c Waived (Completed)   Comprehensive metabolic panel     Endocrine   Hyperlipidemia associated with type 2 diabetes mellitus (HCC) (Chronic)    Chronic, ongoing.  Continue current medication regimen and adjust as needed.  Lipid panel today.      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  Urine ALB 150 April 2024.  Not adherent with diet. Is a challenging case as would benefit endocrinology, however currently without insurance.  Has spoken with [facility] and PCP.  At this time recommend he attend [facility] or [facility], which would lower OOP costs and may be able to provide endocrinology visits which is much needed.  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 - Continue NPH with increase to 55 units and continue Ozempic.  Can not afford Jardiance.  Will work with [facility] Pharm[facility] ongoing. - Continue Benazepril for kidney protection.   - Focus heavily on diabetic diet and modest weight loss, decreasing carb and sugar intake at home -- have discussed at length at visits.   - Up to date foot exam, not up to date eye exam -- recommend he obtain. - Would benefit from [facility] - Recommend he check BS 3-4 times a day and document for next visit for review.  Will continue  to work with CCM team to ensure adherence to medication regimen and diet, which is a major issue.   Discussed at length his A1C goal for  stroke prevention being 6.5% or less.        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  Continue Gabapentin and renal dose as needed in future.      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      Relevant Medications   atorvastatin (LIPITOR) 80 MG tablet   Other Relevant Orders   Bayer DCA Hb A1c Waived (Completed)     Other   Current use of long term anticoagulation (Chronic)    Refer to mechanical valve plan of care.      Relevant Orders   CoaguChek XS/INR Waived (Completed)   History of mechanical aortic valve replacement (Chronic)    Chronic, ongoing with Coumadin use.  INR today 2.2, slightly below 2.5 to 3.5 goal. Not to take Ibuprofen due to risk for bleeding with this and only use Tylenol or OTC Voltaren gel or Icy/Hot patches.  Remain off Fenofibrate. Elected to continue Coumadin 7.5 MG daily at this time.  Return INR in 4 weeks. Return sooner to office if any bleeding or concerns.      Relevant Orders   CoaguChek XS/INR Waived (Completed)   Long-term insulin use (HCC) (Chronic)    Chronic, ongoing.  Refer to diabetes plan of care for further.      Relevant Orders   Bayer DCA Hb A1c Waived (Completed)   Morbid obesity (HCC) (Chronic)    BMI 39.53 with T2DM, HTN/HLD.  Recommended eating smaller high protein, low fat meals more frequently and exercising 30 mins a day 5 times a week with a goal of 10-15lb weight loss in the next 3 months. Patient voiced their understanding and motivation to adhere to these recommendations.       Noncompliance  w/medication treatment due to intermit use of medication (Chronic)    This is complicated by loss of insurance and no current job, which decreased ability to attend specialist visits and obtain certain medications.  At this time recommend he attend [facility] or [facility], which would lower OOP costs and may be able to provide endocrinology visits which is much needed.  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 Discussed he could always return to this PCP in future if wishes, but at this time for increased access and assistance with costs would benefit from clinics mentioned above. [facility] has also discussed these options on calls.      Other Visit Diagnoses     Flu vaccine refused       Refuses flu vaccine, discussed with patient.        Follow up plan: Return in 4 weeks (on 02/08/2023) for Have recommended [facility] or [facility] as no insurance at present and needs  endo.

## 2023-01-11 NOTE — Assessment & Plan Note (Signed)
Chronic, ongoing.  BP at goal today.  Continue Benazepril, HCTZ, Coreg + Amlodipine, educated him on this medication and use + side effects.  Highly recommend he monitor BP at home at least a few mornings a week + focus on DASH diet.  Discussed with him stroke prevention goal for BP <130/80.  LABS: CMP.  Continue cardiology collaboration.  Continue to collaborate with [facility] PharmD.

## 2023-01-12 LAB — LIPID PANEL W/O CHOL/HDL RATIO
Cholesterol, Total: 142 mg/dL (ref 100–199)
HDL: 26 mg/dL — ABNORMAL LOW (ref >39)
LDL Chol Calc (NIH): 67 mg/dL (ref 0–99)
Triglycerides: 305 mg/dL — ABNORMAL HIGH (ref 0–149)
VLDL Cholesterol Cal: 49 mg/dL — ABNORMAL HIGH (ref 5–40)

## 2023-01-12 LAB — COMPREHENSIVE METABOLIC PANEL
ALT: 22 IU/L (ref 0–44)
AST: 16 IU/L (ref 0–40)
Albumin: 4.3 g/dL (ref 3.9–4.9)
Alkaline Phosphatase: 105 IU/L (ref 44–121)
BUN/Creatinine Ratio: 29 — ABNORMAL HIGH (ref 10–24)
BUN: 37 mg/dL — ABNORMAL HIGH (ref 8–27)
Bilirubin Total: 0.5 mg/dL (ref 0.0–1.2)
CO2: 25 mmol/L (ref 20–29)
Calcium: 9.4 mg/dL (ref 8.6–10.2)
Chloride: 95 mmol/L — ABNORMAL LOW (ref 96–106)
Creatinine, Ser: 1.26 mg/dL (ref 0.76–1.27)
Globulin, Total: 3 g/dL (ref 1.5–4.5)
Glucose: 288 mg/dL — ABNORMAL HIGH (ref 70–99)
Potassium: 4.4 mmol/L (ref 3.5–5.2)
Sodium: 134 mmol/L (ref 134–144)
Total Protein: 7.3 g/dL (ref 6.0–8.5)
eGFR: 64 mL/min/{1.73_m2} (ref >59)

## 2023-01-12 NOTE — Progress Notes (Signed)
Good afternoon, please let Armad know his labs have returned and overall kidney and liver function remain stable.  Cholesterol levels continue to show stable LDL, bad cholesterol, but triglycerides remain elevated -- continue all current medications at this time.  Let me know what you find out from [facility] or [facility].  I also had sent separate message to front desk staff to give you local veteran group phone number. Keep being awesome!!  Thank you for allowing me to participate in your care.  I appreciate you. Kindest regards, Tarris Delbene

## 2023-01-13 NOTE — Progress Notes (Signed)
Appointment has been made pt stated that he will try the clinics before calling the [place]

## 2023-01-22 ENCOUNTER — Other Ambulatory Visit: Payer: Self-pay | Admitting: Nurse Practitioner

## 2023-01-22 DIAGNOSIS — I152 Hypertension secondary to endocrine disorders: Secondary | ICD-10-CM

## 2023-01-24 NOTE — Telephone Encounter (Signed)
Requested Prescriptions  Pending Prescriptions Disp Refills   carvedilol (COREG) 25 MG tablet [Pharmacy Med Name: CARVEDILOL 25 MG TABLET] 180 tablet 3    Sig: TAKE 1 TABLET (25 MG TOTAL) BY MOUTH TWICE A DAY WITH MEALS     Cardiovascular: Beta Blockers 3 Passed - 01/22/2023  6:14 PM      Passed - Cr in normal range and within 360 days    Creatinine, Ser  Date Value Ref Range Status  01/11/2023 1.26 0.76 - 1.27 mg/dL Final         Passed - AST in normal range and within 360 days    AST  Date Value Ref Range Status  01/11/2023 16 0 - 40 IU/L Final         Passed - ALT in normal range and within 360 days    ALT  Date Value Ref Range Status  01/11/2023 22 0 - 44 IU/L Final         Passed - Last BP in normal range    BP Readings from Last 1 Encounters:  01/11/23 132/78         Passed - Last Heart Rate in normal range    Pulse Readings from Last 1 Encounters:  01/11/23 76         Passed - Valid encounter within last 6 months    Recent Outpatient Visits           1 week ago Type 2 diabetes mellitus with proteinuria (HCC)   [place] [facility] [place], [place] T, NP   1 month ago History of mechanical aortic valve replacement   [place] [facility] [place], [place] T, NP   2 months ago Cutaneous abscess of other site   [place] [facility] [place], [place] T, NP   2 months ago History of mechanical aortic valve replacement   [place] [facility] [place], [place] T, NP   3 months ago Acute midline low back pain with left-sided sciatica   [place] [facility] [place], Oswaldo Conroy, PA-C       Future Appointments             In 2 weeks Cannady, [name], NP  [facility] [place], [facility]

## 2023-02-01 ENCOUNTER — Other Ambulatory Visit: Payer: Self-pay | Admitting: Nurse Practitioner

## 2023-02-02 NOTE — Telephone Encounter (Signed)
Requested Prescriptions  Pending Prescriptions Disp Refills   Insulin NPH, Human,, Isophane, (NOVOLIN N FLEXPEN) 100 UNIT/ML [place] Med Name: NOVOLIN N 100 UNIT/ML FLEXPEN] 15 mL 0    Sig: INJECT 50 UNITS INTO THE SKIN EVERY MORNING.     Endocrinology:  Diabetes - Insulins Failed - 02/01/2023 11:30 AM      Failed - HBA1C is between 0 and 7.9 and within 180 days    Hemoglobin A1C  Date Value Ref Range Status  12/16/2015 10.4  Final   HB A1C (BAYER DCA - WAIVED)  Date Value Ref Range Status  01/11/2023 11.3 (H) 4.8 - 5.6 % Final    Comment:             Prediabetes: 5.7 - 6.4          Diabetes: >6.4          Glycemic control for adults with diabetes: <7.0          Passed - Valid encounter within last 6 months    Recent Outpatient Visits           3 weeks ago Type 2 diabetes mellitus with proteinuria (HCC)   [facility] [place], [place] T, NP   2 months ago History of mechanical aortic valve replacement   [facility] [place], Corrie Dandy T, NP   2 months ago Cutaneous abscess of other site   [place] [facility] [place], [place] T, NP   3 months ago History of mechanical aortic valve replacement   [facility] [place], [place] T, NP   3 months ago Acute midline low back pain with left-sided sciatica   [place] [facility] [place]ecum, Oswaldo Conroy, PA-C       Future Appointments             In 1 week Cannady, [name], NP [place] [facility], [facility]

## 2023-02-05 NOTE — Patient Instructions (Signed)
Vitamin K Foods and Warfarin Warfarin is a blood thinner (anticoagulant). Anticoagulant medicines help prevent blood clots from forming or getting bigger. Warfarin works by blocking the activity of vitamin K. Vitamin K promotes normal blood clotting. When you take warfarin, problems can occur from suddenly increasing or decreasing the amount of vitamin K that you eat from one day to the next. These problems can occur due to varying levels of warfarin in your blood. Problems may include blood clots or bleeding. What are tips for eating the right amount of vitamin K? Reading food labels Know which foods contain vitamin K. Read food labels. Use the lists below to understand serving sizes and the amount of vitamin K in one serving. If you take a multivitamin that contains vitamin K, be sure to take it every day. Meal planning To avoid problems when taking warfarin: Eat a balanced diet that includes: Fresh fruits and vegetables. Whole grains. Low-fat dairy products. Lean proteins, such as fish, eggs, and lean cuts of meat. Avoid major changes in your diet. If you are going to change your diet, talk with your health care provider before making changes. Keep your intake of vitamin K consistent from day to day. Avoid eating large amounts of vitamin K one day and small amounts of vitamin K the next day. Work with a dietitian to develop a meal plan that works best for you.  What foods are high in vitamin K? Foods that are high in vitamin K contain more than 100 mcg (micrograms) per serving. These include: Broccoli (cooked from fresh) -  cup (78 g) has 110 mcg. Brussels sprouts (cooked from fresh) -  cup (78 g) has 109 mcg. Greens, beet (cooked from fresh) -  cup (72 g) has 350 mcg. Greens, collard (cooked from fresh) -  cup (66 g) has 263 mcg. Greens, turnip (cooked from fresh) -  cup (72 g) has 265 mcg. Green onions or scallions -  cup (50 g) has 105 mcg. Kale (cooked from fresh) -  cup (68  g) has 536 mcg. Parsley (raw) - 10 sprigs (10 g) has 164 mcg. Spinach (cooked from fresh) -  cup (90 g) has 444 mcg. Swiss chard (cooked from fresh) -  cup (88 g) has 287 mcg. The items listed above may not be a complete list of foods high in Vitamin K. Actual amounts of Vitamin K may differ depending on processing. Contact a dietitian for more information. What foods have a moderate amount of vitamin K? Foods that have a moderate amount of vitamin K contain 25-100 mcg per serving. These include: Asparagus (cooked from fresh) - 4 spears (60 g) have 30 mcg. Black-eyed peas (dried) -  cup (85 g) has 32 mcg. Cabbage (cooked from fresh) -  cup (78 g) has 84 mcg. Cabbage (raw) -  cup (35 g) has 26 mcg. Kiwi fruit - 1 medium (69 g) has 27 mcg. Lettuce (raw) - 1 cup (36 g) has 45 mcg. Okra (cooked from fresh) -  cup (80 g) has 32 mcg. Prunes (dried) - 5 prunes (47 g) have 25 mcg. Tuna, light, canned in oil - 3 oz (85 g) has 37 mcg. Watercress (raw) - 1 cup (34 g) has 85 mcg. The items listed above may not be a complete list of foods with a moderate amount of Vitamin K. Actual amounts of Vitamin K may differ depending on processing. Contact a dietitian for more information. What foods are low in vitamin K? Foods low in   vitamin K contain less than 25 mcg per serving. These include: Artichoke - 1 medium (128 g) has 18 mcg. Avocado - 1 oz (21 g) has 6 mcg. Blueberries -  cup (73 g) has 14 mcg. Carrots (cooked from fresh) -  cup (78 g) has 11 mcg. Cauliflower (raw) -  cup (54 g) has 8 mcg. Cucumber with peel (raw) -  cup (52 g) has 9 mcg. Grapes -  cup (76 g) has 12 mcg. Mango - 1 medium (207 g) has 9 mcg. Mixed nuts - 1 cup (142 g) has 17 mcg. Pear - 1 medium (178 g) has 8 mcg. Peas (cooked from fresh) -  cup (80 g) has 20 mcg. Pickled cucumber - 1 spear (65 g) has 11 mcg. Sauerkraut (canned) -  cup (118 g) has 16 mcg. Soybeans (cooked from fresh) -  cup (86 g) has 16 mcg. Tomato  (raw) - 1 medium (123 g) has 10 mcg. Tomato sauce (raw) -  cup (123 g) has 17 mcg. The items listed above may not be a complete list of foods low in Vitamin K. Actual amounts of Vitamin K may differ depending on processing. Contact a dietitian for more information. What foods do not have vitamin K? If a food contains less than 5 mcg per serving, it is considered to have no vitamin K. These foods include: Bread and cereal products. Cheese. Eggs. Fish and shellfish. Meat and poultry. Milk and dairy products. Seeds, such as sunflower or pumpkin seeds. The items listed above may not be a complete list of foods that do not have vitamin K. Actual amounts of vitamin K may differ depending on processing. Contact a dietitian for more information. Summary Warfarin is an anticoagulant that prevents blood clots from forming or getting bigger by blocking the activity of vitamin K. It is important to know the amount of vitamin K that is in the foods you eat and to keep your intake of vitamin K consistent from day to day. Avoid major changes in your diet. If you are going to change your diet, talk with your health care provider before making changes. This information is not intended to replace advice given to you by your health care provider. Make sure you discuss any questions you have with your health care provider. Document Revised: 07/01/2020 Document Reviewed: 07/01/2020 Elsevier Patient Education  2024 Elsevier Inc.  

## 2023-02-10 ENCOUNTER — Ambulatory Visit: Payer: 59 | Admitting: Nurse Practitioner

## 2023-02-10 ENCOUNTER — Encounter: Payer: Self-pay | Admitting: Nurse Practitioner

## 2023-02-10 VITALS — BP 136/82 | HR 86 | Temp 97.6°F | Ht 68.0 in | Wt 261.0 lb

## 2023-02-10 DIAGNOSIS — Z952 Presence of prosthetic heart valve: Secondary | ICD-10-CM

## 2023-02-10 DIAGNOSIS — Z7901 Long term (current) use of anticoagulants: Secondary | ICD-10-CM | POA: Diagnosis not present

## 2023-02-10 LAB — COAGUCHEK XS/INR WAIVED
INR: 2.1 — ABNORMAL HIGH (ref 0.9–1.1)
Prothrombin Time: 25.1 s

## 2023-02-10 NOTE — Progress Notes (Signed)
BP 136/82   Pulse 86   Temp 97.6 F (36.4 C)   Ht 5\' 8"  (1.727 m)   Wt 261 lb (118.4 kg)   SpO2 97%   BMI 39.68 kg/m    Subjective:    Patient ID: Rodney Chary [name], male    DOB: February 25, 1961, 62 y.o.   MRN: [id number]  CC: Coumadin management and abscess follow-up  HPI: This patient is a 62 y.o. male who presents for coumadin management. The expected duration of coumadin treatment is lifelong. The reason for anticoagulation is  mechanical heart valve. Follows with cardiology, Dr. Darrold Junker, last on 09/14/2022, no changes.  Echo 03/13/2020 with LVEF greater then 55%, stable valve.    Present Coumadin dose: 7.5 MG daily Goal: 2.5-3.5 The patient does not have an active anticoagulation episode. Excessive bruising: no Nose bleeding: no Rectal bleeding: no Eating diet with consistent amounts of foods containing Vitamin K:no Any recent antibiotic use? no  Relevant past medical, surgical, family and social history reviewed and updated as indicated. Interim medical history since our last visit reviewed. Allergies and medications reviewed and updated.  ROS: Per HPI unless specifically indicated above     Objective:    BP 136/82   Pulse 86   Temp 97.6 F (36.4 C)   Ht 5\' 8"  (1.727 m)   Wt 261 lb (118.4 kg)   SpO2 97%   BMI 39.68 kg/m   Wt Readings from Last 3 Encounters:  02/10/23 261 lb (118.4 kg)  01/11/23 260 lb (117.9 kg)  11/28/22 258 lb 12.8 oz (117.4 kg)    General: Well appearing, well nourished in no distress.  Normal mood and affect. Skin: No excessive bruising or rash  Last INR: 2.2  Last CBC:  Lab Results  Component Value Date   WBC 9.9 04/11/2022   HGB 16.1 04/11/2022   HCT 50.0 04/11/2022   MCV 87.1 04/11/2022   PLT 191 04/11/2022    Results for orders placed or performed in visit on 01/11/23  Bayer DCA Hb A1c Waived  Result Value Ref Range   HB A1C (BAYER DCA - WAIVED) 11.3 (H) 4.8 - 5.6 %  CoaguChek XS/INR Waived  Result Value Ref Range   INR  2.2 (H) 0.9 - 1.1   Prothrombin Time 26.4 sec  Comprehensive metabolic panel  Result Value Ref Range   Glucose 288 (H) 70 - 99 mg/dL   BUN 37 (H) 8 - 27 mg/dL   Creatinine, Ser 4.09 0.76 - 1.27 mg/dL   eGFR 64 >81 XB/JYN/8.29   BUN/Creatinine Ratio 29 (H) 10 - 24   Sodium 134 134 - 144 mmol/L   Potassium 4.4 3.5 - 5.2 mmol/L   Chloride 95 (L) 96 - 106 mmol/L   CO2 25 20 - 29 mmol/L   Calcium 9.4 8.6 - 10.2 mg/dL   Total Protein 7.3 6.0 - 8.5 g/dL   Albumin 4.3 3.9 - 4.9 g/dL   Globulin, Total 3.0 1.5 - 4.5 g/dL   Bilirubin Total 0.5 0.0 - 1.2 mg/dL   Alkaline Phosphatase 105 44 - 121 IU/L   AST 16 0 - 40 IU/L   ALT 22 0 - 44 IU/L  Lipid Panel w/o Chol/HDL Ratio  Result Value Ref Range   Cholesterol, Total 142 100 - 199 mg/dL   Triglycerides 562 (H) 0 - 149 mg/dL   HDL 26 (L) >13 mg/dL   VLDL Cholesterol Cal 49 (H) 5 - 40 mg/dL   LDL Chol Calc (NIH)  67 0 - 99 mg/dL    Assessment:     [id number]   1. History of mechanical aortic valve replacement  Z95.2 CoaguChek XS/INR Waived    2. Current use of long term anticoagulation  Z79.01 CoaguChek XS/INR Waived      Plan:   Discussed current plan face-to-face with patient. For coumadin dosing, will change Coumadin to 7.5 MG 6 days a week and 8 MG one day a week.  Will plan to recheck INR in 2 weeks.

## 2023-02-10 NOTE — Assessment & Plan Note (Signed)
Refer to mechanical valve plan of care. 

## 2023-02-10 NOTE — Assessment & Plan Note (Addendum)
Chronic, ongoing with Coumadin use.  INR today 2.1, slightly below 2.5 to 3.5 goal. Not to take Ibuprofen due to risk for bleeding with this and only use Tylenol or OTC Voltaren gel or Icy/Hot patches.  Remain off Fenofibrate. Elected to change Coumadin to 7.5 MG 6 days a week and 8 MG one day a week. Return INR in 2 weeks. Return sooner to office if any bleeding or concerns.

## 2023-02-25 NOTE — Patient Instructions (Signed)
Vitamin K Foods and Warfarin Warfarin is a blood thinner (anticoagulant). Anticoagulant medicines help prevent blood clots from forming or getting bigger. Warfarin works by blocking the activity of vitamin K. Vitamin K promotes normal blood clotting. When you take warfarin, problems can occur from suddenly increasing or decreasing the amount of vitamin K that you eat from one day to the next. These problems can occur due to varying levels of warfarin in your blood. Problems may include blood clots or bleeding. What are tips for eating the right amount of vitamin K? Reading food labels Know which foods contain vitamin K. Read food labels. Use the lists below to understand serving sizes and the amount of vitamin K in one serving. If you take a multivitamin that contains vitamin K, be sure to take it every day. Meal planning To avoid problems when taking warfarin: Eat a balanced diet that includes: Fresh fruits and vegetables. Whole grains. Low-fat dairy products. Lean proteins, such as fish, eggs, and lean cuts of meat. Avoid major changes in your diet. If you are going to change your diet, talk with your health care provider before making changes. Keep your intake of vitamin K consistent from day to day. Avoid eating large amounts of vitamin K one day and small amounts of vitamin K the next day. Work with a dietitian to develop a meal plan that works best for you.  What foods are high in vitamin K? Foods that are high in vitamin K contain more than 100 mcg (micrograms) per serving. These include: Broccoli (cooked from fresh) -  cup (78 g) has 110 mcg. Brussels sprouts (cooked from fresh) -  cup (78 g) has 109 mcg. Greens, beet (cooked from fresh) -  cup (72 g) has 350 mcg. Greens, collard (cooked from fresh) -  cup (66 g) has 263 mcg. Greens, turnip (cooked from fresh) -  cup (72 g) has 265 mcg. Green onions or scallions -  cup (50 g) has 105 mcg. Kale (cooked from fresh) -  cup (68  g) has 536 mcg. Parsley (raw) - 10 sprigs (10 g) has 164 mcg. Spinach (cooked from fresh) -  cup (90 g) has 444 mcg. Swiss chard (cooked from fresh) -  cup (88 g) has 287 mcg. The items listed above may not be a complete list of foods high in Vitamin K. Actual amounts of Vitamin K may differ depending on processing. Contact a dietitian for more information. What foods have a moderate amount of vitamin K? Foods that have a moderate amount of vitamin K contain 25-100 mcg per serving. These include: Asparagus (cooked from fresh) - 4 spears (60 g) have 30 mcg. Black-eyed peas (dried) -  cup (85 g) has 32 mcg. Cabbage (cooked from fresh) -  cup (78 g) has 84 mcg. Cabbage (raw) -  cup (35 g) has 26 mcg. Kiwi fruit - 1 medium (69 g) has 27 mcg. Lettuce (raw) - 1 cup (36 g) has 45 mcg. Okra (cooked from fresh) -  cup (80 g) has 32 mcg. Prunes (dried) - 5 prunes (47 g) have 25 mcg. Tuna, light, canned in oil - 3 oz (85 g) has 37 mcg. Watercress (raw) - 1 cup (34 g) has 85 mcg. The items listed above may not be a complete list of foods with a moderate amount of Vitamin K. Actual amounts of Vitamin K may differ depending on processing. Contact a dietitian for more information. What foods are low in vitamin K? Foods low in   vitamin K contain less than 25 mcg per serving. These include: Artichoke - 1 medium (128 g) has 18 mcg. Avocado - 1 oz (21 g) has 6 mcg. Blueberries -  cup (73 g) has 14 mcg. Carrots (cooked from fresh) -  cup (78 g) has 11 mcg. Cauliflower (raw) -  cup (54 g) has 8 mcg. Cucumber with peel (raw) -  cup (52 g) has 9 mcg. Grapes -  cup (76 g) has 12 mcg. Mango - 1 medium (207 g) has 9 mcg. Mixed nuts - 1 cup (142 g) has 17 mcg. Pear - 1 medium (178 g) has 8 mcg. Peas (cooked from fresh) -  cup (80 g) has 20 mcg. Pickled cucumber - 1 spear (65 g) has 11 mcg. Sauerkraut (canned) -  cup (118 g) has 16 mcg. Soybeans (cooked from fresh) -  cup (86 g) has 16 mcg. Tomato  (raw) - 1 medium (123 g) has 10 mcg. Tomato sauce (raw) -  cup (123 g) has 17 mcg. The items listed above may not be a complete list of foods low in Vitamin K. Actual amounts of Vitamin K may differ depending on processing. Contact a dietitian for more information. What foods do not have vitamin K? If a food contains less than 5 mcg per serving, it is considered to have no vitamin K. These foods include: Bread and cereal products. Cheese. Eggs. Fish and shellfish. Meat and poultry. Milk and dairy products. Seeds, such as sunflower or pumpkin seeds. The items listed above may not be a complete list of foods that do not have vitamin K. Actual amounts of vitamin K may differ depending on processing. Contact a dietitian for more information. Summary Warfarin is an anticoagulant that prevents blood clots from forming or getting bigger by blocking the activity of vitamin K. It is important to know the amount of vitamin K that is in the foods you eat and to keep your intake of vitamin K consistent from day to day. Avoid major changes in your diet. If you are going to change your diet, talk with your health care provider before making changes. This information is not intended to replace advice given to you by your health care provider. Make sure you discuss any questions you have with your health care provider. Document Revised: 07/01/2020 Document Reviewed: 07/01/2020 Elsevier Patient Education  2024 Elsevier Inc.  

## 2023-02-27 ENCOUNTER — Encounter: Payer: Self-pay | Admitting: Nurse Practitioner

## 2023-02-27 ENCOUNTER — Ambulatory Visit: Payer: 59 | Admitting: Nurse Practitioner

## 2023-02-27 VITALS — BP 135/80 | HR 73 | Temp 98.4°F | Wt 262.8 lb

## 2023-02-27 DIAGNOSIS — Z952 Presence of prosthetic heart valve: Secondary | ICD-10-CM

## 2023-02-27 DIAGNOSIS — E1159 Type 2 diabetes mellitus with other circulatory complications: Secondary | ICD-10-CM

## 2023-02-27 DIAGNOSIS — I152 Hypertension secondary to endocrine disorders: Secondary | ICD-10-CM

## 2023-02-27 DIAGNOSIS — Z7901 Long term (current) use of anticoagulants: Secondary | ICD-10-CM

## 2023-02-27 DIAGNOSIS — Z7984 Long term (current) use of oral hypoglycemic drugs: Secondary | ICD-10-CM | POA: Diagnosis not present

## 2023-02-27 LAB — COAGUCHEK XS/INR WAIVED
INR: 1.8 — ABNORMAL HIGH (ref 0.9–1.1)
Prothrombin Time: 21.6 s

## 2023-02-27 MED ORDER — WARFARIN SODIUM 5 MG PO TABS: ORAL_TABLET | ORAL | 4 refills | Status: DC

## 2023-02-27 MED ORDER — HYDROCHLOROTHIAZIDE 25 MG PO TABS: 25.0000 mg | ORAL_TABLET | Freq: Every day | ORAL | 4 refills | Status: DC

## 2023-02-27 NOTE — Assessment & Plan Note (Signed)
Chronic, ongoing with Coumadin use.  INR today 1.8,  below 2.5 to 3.5 goal due to not taking as was instructed. Not to take Ibuprofen due to risk for bleeding with this and only use Tylenol or OTC Voltaren gel or Icy/Hot patches.  Remain off Fenofibrate. Elected to change Coumadin to 7.5 MG 6 days a week and 8 MG one day a week. Return INR in 2 weeks. Return sooner to office if any bleeding or concerns.

## 2023-02-27 NOTE — Progress Notes (Signed)
BP 135/80   Pulse 73   Temp 98.4 F (36.9 C) (Oral)   Wt 262 lb 12.8 oz (119.2 kg)   SpO2 96%   BMI 39.96 kg/m    Subjective:    Patient ID: Rodney Chary [name], male    DOB: 1960/09/06, 62 y.o.   MRN: [id number]  CC: Coumadin management and abscess follow-up  HPI: This patient is a 61 y.o. male who presents for coumadin management. The expected duration of coumadin treatment is lifelong. The reason for anticoagulation is  mechanical heart valve. Follows with cardiology, Dr. Darrold Junker, last on 09/14/2022, no changes.  Echo 03/13/2020 with LVEF greater then 55%, stable valve.  Ran out of 5 MG tablets.  Present Coumadin dose: taking 8 MG every 4 days and 6.5 MG the rest -- was to be taking 7.5 MG six days a week and 8 MG one day a week Goal: 2.5-3.5 The patient does not have an active anticoagulation episode. Excessive bruising: no Nose bleeding: no Rectal bleeding: no Eating diet with consistent amounts of foods containing Vitamin K:no Any recent antibiotic use? no  Relevant past medical, surgical, family and social history reviewed and updated as indicated. Interim medical history since our last visit reviewed. Allergies and medications reviewed and updated.  ROS: Per HPI unless specifically indicated above     Objective:    BP 135/80   Pulse 73   Temp 98.4 F (36.9 C) (Oral)   Wt 262 lb 12.8 oz (119.2 kg)   SpO2 96%   BMI 39.96 kg/m   Wt Readings from Last 3 Encounters:  02/27/23 262 lb 12.8 oz (119.2 kg)  02/10/23 261 lb (118.4 kg)  01/11/23 260 lb (117.9 kg)    General: Well appearing, well nourished in no distress.  Normal mood and affect. Skin: No excessive bruising or rash  Last INR: 2.1  Last CBC:  Lab Results  Component Value Date   WBC 9.9 04/11/2022   HGB 16.1 04/11/2022   HCT 50.0 04/11/2022   MCV 87.1 04/11/2022   PLT 191 04/11/2022    Results for orders placed or performed in visit on 02/10/23  CoaguChek XS/INR Waived  Result Value Ref Range    INR 2.1 (H) 0.9 - 1.1   Prothrombin Time 25.1 sec    Assessment:     ICD-10-CM   1. History of mechanical aortic valve replacement  Z95.2 CoaguChek XS/INR Waived    2. Current use of long term anticoagulation  Z79.01 CoaguChek XS/INR Waived    3. Hypertension associated with type 2 diabetes mellitus (HCC)  E11.59 hydrochlorothiazide (HYDRODIURIL) 25 MG tablet   I15.2    Refills on medication sent      Plan:   Discussed current plan face-to-face with patient. For coumadin dosing, will change Coumadin to 7.5 MG 6 days a week and 8 MG one day a week.  Will plan to recheck INR in 2 weeks.

## 2023-02-27 NOTE — Assessment & Plan Note (Signed)
Refer to mechanical valve plan of care. 

## 2023-03-10 ENCOUNTER — Ambulatory Visit (INDEPENDENT_AMBULATORY_CARE_PROVIDER_SITE_OTHER): Payer: 59 | Admitting: Family Medicine

## 2023-03-10 VITALS — BP 142/80 | HR 73 | Temp 98.6°F | Ht 69.69 in | Wt 263.0 lb

## 2023-03-10 DIAGNOSIS — J069 Acute upper respiratory infection, unspecified: Secondary | ICD-10-CM | POA: Diagnosis not present

## 2023-03-10 MED ORDER — FLUTICASONE PROPIONATE 50 MCG/ACT NA SUSP: 2.0000 | Freq: Every day | NASAL | 2 refills | Status: DC

## 2023-03-10 MED ORDER — HYDROCOD POLI-CHLORPHE POLI ER 10-8 MG/5ML PO SUER: 5.0000 mL | Freq: Two times a day (BID) | ORAL | 0 refills | Status: DC | PRN

## 2023-03-10 NOTE — Patient Instructions (Addendum)
Take Tussionex twice daily as needed for cough Try over the counter Flonase for nasal congestion Try Mucinex over the counter for congestion  Try warm liquids such as hot tea and soup to help thin mucus.  Continue albuterol use as needed

## 2023-03-10 NOTE — Progress Notes (Unsigned)
BP (!) 142/80   Pulse 73   Temp 98.6 F (37 C) (Oral)   Ht 5' 9.69" (1.77 m)   Wt 263 lb (119.3 kg)   SpO2 94%   BMI 38.08 kg/m    Subjective:    Patient ID: Rodney Chary [name], male    DOB: 03/30/1961, 62 y.o.   MRN: [id number]  HPI: Rodney Dixon. is a 62 y.o. male  Chief Complaint  Patient presents with   Cough   UPPER RESPIRATORY TRACT INFECTION Started 2 days ago.  Worst symptom: Cough that is keeping him up at night Fever: no Cough: yes productive Shortness of breath: no Wheezing: Yes while sleeping at night Chest pain: no Chest tightness: no Chest congestion: no Nasal congestion: yes Runny nose: yes Post nasal drip: no Sneezing: yes Sore throat: yes one day ago Swollen glands: no Sinus pressure: no Headache: no Face pain: no Toothache: no Ear pain: no  Ear pressure: no  Eyes red/itching:no Eye drainage/crusting: watery eyes Vomiting: no Rash: no Fatigue: yes Sick contacts: yes exposed to toddler grandson that was sick Strep contacts: no  Context: stable Recurrent sinusitis: no Relief with OTC cold/cough medications:  midly   Treatments attempted:  Nyquil, inhaler, and mucinex    Relevant past medical, surgical, family and social history reviewed and updated as indicated. Interim medical history since our last visit reviewed. Allergies and medications reviewed and updated.  Review of Systems  Constitutional:  Positive for fatigue. Negative for chills and fever.  HENT:  Positive for congestion, rhinorrhea, sneezing and sore throat. Negative for ear pain, postnasal drip, sinus pressure and sinus pain.   Eyes:  Positive for discharge. Negative for redness and itching.  Respiratory:  Positive for cough and wheezing. Negative for chest tightness and shortness of breath.   Cardiovascular:  Negative for chest pain.  Gastrointestinal:  Negative for vomiting.  Skin:  Negative for rash.  Neurological:  Negative for headaches.    Per HPI unless  specifically indicated above     Objective:    BP (!) 142/80   Pulse 73   Temp 98.6 F (37 C) (Oral)   Ht 5' 9.69" (1.77 m)   Wt 263 lb (119.3 kg)   SpO2 94%   BMI 38.08 kg/m   Wt Readings from Last 3 Encounters:  03/10/23 263 lb (119.3 kg)  02/27/23 262 lb 12.8 oz (119.2 kg)  02/10/23 261 lb (118.4 kg)    Physical Exam Vitals and nursing note reviewed.  Constitutional:      General: He is not in acute distress.    Appearance: Normal appearance. He is obese. He is not ill-appearing, toxic-appearing or diaphoretic.  HENT:     Head: Normocephalic and atraumatic.     Right Ear: Tympanic membrane, ear canal and external ear normal. There is no impacted cerumen. Tympanic membrane is not erythematous.     Left Ear: Tympanic membrane, ear canal and external ear normal. There is no impacted cerumen. Tympanic membrane is not erythematous.     Nose: Congestion and rhinorrhea present.     Right Turbinates: Pale. Not swollen.     Left Turbinates: Pale. Not swollen.     Right Sinus: No maxillary sinus tenderness or frontal sinus tenderness.     Left Sinus: No maxillary sinus tenderness or frontal sinus tenderness.     Mouth/Throat:     Mouth: Mucous membranes are moist.     Pharynx: Oropharynx is clear. No oropharyngeal exudate, posterior  oropharyngeal erythema or postnasal drip.  Eyes:     General: No scleral icterus.       Right eye: No discharge.        Left eye: No discharge.     Extraocular Movements: Extraocular movements intact.     Conjunctiva/sclera: Conjunctivae normal.     Pupils: Pupils are equal, round, and reactive to light.  Neck:     Vascular: No carotid bruit.  Cardiovascular:     Rate and Rhythm: Normal rate and regular rhythm.     Pulses: Normal pulses.          Radial pulses are 2+ on the right side and 2+ on the left side.       Posterior tibial pulses are 2+ on the right side and 2+ on the left side.     Heart sounds: Normal heart sounds. No murmur heard.     No friction rub. No gallop.  Pulmonary:     Effort: Pulmonary effort is normal. No respiratory distress.     Breath sounds: No stridor. Examination of the right-upper field reveals wheezing. Examination of the left-upper field reveals wheezing. Wheezing present. No rhonchi or rales.     Comments: Inspiratory wheeze Chest:     Chest wall: No tenderness.  Musculoskeletal:        General: Normal range of motion.     Cervical back: Normal range of motion and neck supple. No rigidity. No muscular tenderness.  Lymphadenopathy:     Cervical: No cervical adenopathy.  Skin:    General: Skin is warm and dry.     Capillary Refill: Capillary refill takes less than 2 seconds.     Coloration: Skin is not jaundiced or pale.     Findings: No bruising, erythema, lesion or rash.  Neurological:     General: No focal deficit present.     Mental Status: He is alert and oriented to person, place, and time. Mental status is at baseline.     Cranial Nerves: No cranial nerve deficit.     Sensory: No sensory deficit.     Motor: No weakness.     Coordination: Coordination normal.     Gait: Gait normal.     Deep Tendon Reflexes: Reflexes normal.  Psychiatric:        Mood and Affect: Mood normal.        Behavior: Behavior normal.        Thought Content: Thought content normal.        Judgment: Judgment normal.     Results for orders placed or performed in visit on 03/10/23  Rapid Strep screen([facility])   Specimen: Other   Other  Result Value Ref Range   Strep Gp A Ag, IA W/Reflex Negative Negative  Novel Coronavirus, NAA ([facility])   Specimen: Nasopharyngeal(NP) swabs in vial transport medium  Result Value Ref Range   SARS-CoV-2, NAA Not Detected Not Detected  Culture, Group A Strep   Other  Result Value Ref Range   Strep A Culture Comment   Veritor Flu A/B Waived  Result Value Ref Range   Influenza A Negative Negative   Influenza B Negative Negative      Assessment & Plan:    Problem List Items Addressed This Visit     Acute URI - Primary    Acute, ongoing . Strep and Flu -, awaiting COVID. Recommend Tussionex as needed for cough that is keeping him up at night, mucinex for congestion, warm liquids such as hot  tea/soup, Flonase for nasal congestion, and plenty of rest and hydration. Return as needed, call sooner if concerns arise.       Relevant Orders   Veritor Flu A/B Waived (Completed)   Rapid Strep screen([facility]) (Completed)   Novel Coronavirus, NAA ([facility]) (Completed)     Follow up plan: Return if symptoms worsen or fail to improve.

## 2023-03-10 NOTE — Assessment & Plan Note (Signed)
Acute, ongoing . Strep and Flu -, awaiting COVID. Recommend Tussionex as needed for cough that is keeping him up at night, mucinex for congestion, warm liquids such as hot tea/soup, Flonase for nasal congestion, and plenty of rest and hydration. Return as needed, call sooner if concerns arise.

## 2023-03-11 LAB — NOVEL CORONAVIRUS, NAA: SARS-CoV-2, NAA: NOT DETECTED

## 2023-03-13 ENCOUNTER — Ambulatory Visit: Payer: 59 | Admitting: Nurse Practitioner

## 2023-03-13 DIAGNOSIS — Z952 Presence of prosthetic heart valve: Secondary | ICD-10-CM

## 2023-03-13 DIAGNOSIS — Z7901 Long term (current) use of anticoagulants: Secondary | ICD-10-CM

## 2023-03-14 LAB — VERITOR FLU A/B WAIVED
Influenza A: NEGATIVE
Influenza B: NEGATIVE

## 2023-03-14 LAB — RAPID STREP SCREEN (MED CTR MEBANE ONLY): Strep Gp A Ag, IA W/Reflex: NEGATIVE

## 2023-03-14 LAB — CULTURE, GROUP A STREP: Strep A Culture: NEGATIVE

## 2023-03-14 NOTE — Progress Notes (Signed)
Hi, can you let patient know his COVID and strep culture results have returned negative. Thank you!

## 2023-03-17 ENCOUNTER — Encounter: Payer: Self-pay | Admitting: Nurse Practitioner

## 2023-03-17 ENCOUNTER — Ambulatory Visit: Payer: 59 | Admitting: Nurse Practitioner

## 2023-03-17 VITALS — BP 128/82 | HR 70 | Temp 98.2°F | Ht 69.6 in | Wt 258.4 lb

## 2023-03-17 DIAGNOSIS — E785 Hyperlipidemia, unspecified: Secondary | ICD-10-CM | POA: Diagnosis not present

## 2023-03-17 DIAGNOSIS — I10 Essential (primary) hypertension: Secondary | ICD-10-CM | POA: Diagnosis not present

## 2023-03-17 DIAGNOSIS — R052 Subacute cough: Secondary | ICD-10-CM | POA: Diagnosis not present

## 2023-03-17 DIAGNOSIS — E1169 Type 2 diabetes mellitus with other specified complication: Secondary | ICD-10-CM | POA: Diagnosis not present

## 2023-03-17 DIAGNOSIS — Z954 Presence of other heart-valve replacement: Secondary | ICD-10-CM | POA: Diagnosis not present

## 2023-03-17 DIAGNOSIS — I482 Chronic atrial fibrillation, unspecified: Secondary | ICD-10-CM | POA: Diagnosis not present

## 2023-03-17 DIAGNOSIS — Z952 Presence of prosthetic heart valve: Secondary | ICD-10-CM | POA: Diagnosis not present

## 2023-03-17 DIAGNOSIS — Z7901 Long term (current) use of anticoagulants: Secondary | ICD-10-CM | POA: Diagnosis not present

## 2023-03-17 DIAGNOSIS — E119 Type 2 diabetes mellitus without complications: Secondary | ICD-10-CM | POA: Diagnosis not present

## 2023-03-17 LAB — COAGUCHEK XS/INR WAIVED
INR: 2.3 — ABNORMAL HIGH (ref 0.9–1.1)
Prothrombin Time: 27.1 s

## 2023-03-17 MED ORDER — CEPHALEXIN 500 MG PO CAPS: 500.0000 mg | ORAL_CAPSULE | Freq: Two times a day (BID) | ORAL | 0 refills | Status: AC

## 2023-03-17 NOTE — Assessment & Plan Note (Signed)
Acute and ongoing for 9 days at this time.  Some wheezes noted on exam, but no rhonchi.  Since going on day 9 will start Keflex 500 MG BID and reduce Coumadin a little to help while on abx therapy.  Recommend: - Increased rest - Increasing Fluids - Acetaminophen as needed for fever/pain.  - Salt water gargling, chloraseptic spray and throat lozenges - Mucinex or Coricidin - Saline sinus flushes or a neti pot.  - Humidifying the air.

## 2023-03-17 NOTE — Assessment & Plan Note (Signed)
Refer to mechanical valve plan of care. 

## 2023-03-17 NOTE — Patient Instructions (Signed)
Warfarin Blood Tests You will learn about the importance of your warfarin dosing, testing, and precautions. Also, signs that your warfarin dose is too high. To view the content, go to this web address: [email or web address]  This video will expire on: 10/30/2024. If you need access to this video following this date, please reach out to the healthcare provider who assigned it to you. This information is not intended to replace advice given to you by your health care provider. Make sure you discuss any questions you have with your health care provider. Elsevier Patient Education  2024 [employer].

## 2023-03-17 NOTE — Assessment & Plan Note (Signed)
Chronic, ongoing with Coumadin use.  INR today 2.3,  improving but still a little below 2.5 to 3.5 goal. Am currently placing on short course of abx therapy for ongoing cough x 9 days.  Recommend he reduce Coumadin to 7.5 MG daily due to risk of abx elevating INR.  Not to take Ibuprofen due to risk for bleeding with this and only use Tylenol or OTC Voltaren gel or Icy/Hot patches.  Remain off Fenofibrate. Return sooner to office if any bleeding or concerns.

## 2023-03-17 NOTE — Progress Notes (Signed)
BP 128/82 (BP Location: Left Arm, Patient Position: Sitting)   Pulse 70   Temp 98.2 F (36.8 C) (Oral)   Ht 5' 9.6" (1.768 m)   Wt 258 lb 6.4 oz (117.2 kg)   SpO2 96%   BMI 37.50 kg/m    Subjective:    Patient ID: Rodney Chary [name], male    DOB: [birth year], 62 y.o.   MRN: [id number]  CC: Coumadin management  HPI: This patient is a 62 y.o. male who presents for coumadin management. The expected duration of coumadin treatment is lifelong The reason for anticoagulation is  mechanical heart valve. Follows with cardiology, Dr. Darrold Junker, last on 09/14/2022, no changes.  Echo 03/13/2020 with LVEF greater then 55%, stable valve.    Currently has ongoing URI, was seen on 03/10/23 for this but was on day 2 of illness and did not get abx which he was upset about.  Explained this to him.  Was given Tussionex for symptoms relief.  Is now on day 9 and starting to feel a bit better, is eating now and drinking coffee.  Still has cough and is a little wheezy.  Has been using Albuterol inhaler, which helps some.    Present Coumadin dose: 7.5 MG 6 days a week and 8 MG one day a week  Goal: 2.5-3.5 The patient does not have an active anticoagulation episode. Excessive bruising: no Nose bleeding: no Rectal bleeding: no Prolonged menstrual cycles: N/A Eating diet with consistent amounts of foods containing Vitamin K:no Any recent antibiotic use? no  Relevant past medical, surgical, family and social history reviewed and updated as indicated. Interim medical history since our last visit reviewed. Allergies and medications reviewed and updated.  ROS: Per HPI unless specifically indicated above     Objective:    BP 128/82 (BP Location: Left Arm, Patient Position: Sitting)   Pulse 70   Temp 98.2 F (36.8 C) (Oral)   Ht 5' 9.6" (1.768 m)   Wt 258 lb 6.4 oz (117.2 kg)   SpO2 96%   BMI 37.50 kg/m   Wt Readings from Last 3 Encounters:  03/17/23 258 lb 6.4 oz (117.2 kg)  03/10/23 263 lb (119.3 kg)   02/27/23 262 lb 12.8 oz (119.2 kg)     General: Well appearing, well nourished in no distress.  Normal mood and affect. Skin: No excessive bruising or rash  Last INR: 1.8    Last CBC:  Lab Results  Component Value Date   WBC 9.9 04/11/2022   HGB 16.1 04/11/2022   HCT 50.0 04/11/2022   MCV 87.1 04/11/2022   PLT 191 04/11/2022    Results for orders placed or performed in visit on 03/10/23  Rapid Strep screen([facility])   Specimen: Other   Other  Result Value Ref Range   Strep Gp A Ag, IA W/Reflex Negative Negative  Novel Coronavirus, NAA ([facility])   Specimen: Nasopharyngeal(NP) swabs in vial transport medium  Result Value Ref Range   SARS-CoV-2, NAA Not Detected Not Detected  Culture, Group A Strep   Other  Result Value Ref Range   Strep A Culture Negative   Veritor Flu A/B Waived  Result Value Ref Range   Influenza A Negative Negative   Influenza B Negative Negative       Assessment:     ICD-10-CM   1. History of mechanical aortic valve replacement  Z95.2 CoaguChek XS/INR Waived (STAT)    2. Current use of long term anticoagulation  Z79.01  3. Subacute cough  R05.2       Plan:   Discussed current plan face-to-face with patient. For coumadin dosing, elected to reduce to 7.5 MG daily as is starting abx therapy. Will plan to recheck INR in 1 week.

## 2023-03-23 ENCOUNTER — Other Ambulatory Visit: Payer: Self-pay | Admitting: Nurse Practitioner

## 2023-03-23 NOTE — Telephone Encounter (Signed)
Requested medication (s) are due for refill today:yes  Requested medication (s) are on the active medication list:yes  Last refill:  02/02/23 15 ml  Future visit scheduled: yes  Notes to clinic:  lab outside normal limits   Requested Prescriptions  Pending Prescriptions Disp Refills   NOVOLIN N FLEXPEN 100 UNIT/ML FlexPen [Pharmacy Med Name: NOVOLIN N 100 UNIT/ML FLEXPEN]  1    Sig: INJECT 50 UNITS INTO THE SKIN EVERY MORNING.     Endocrinology:  Diabetes - Insulins Failed - 03/23/2023  8:30 AM      Failed - HBA1C is between 0 and 7.9 and within 180 days    Hemoglobin A1C  Date Value Ref Range Status  12/16/2015 10.4  Final   HB A1C (BAYER DCA - WAIVED)  Date Value Ref Range Status  01/11/2023 11.3 (H) 4.8 - 5.6 % Final    Comment:             Prediabetes: 5.7 - 6.4          Diabetes: >6.4          Glycemic control for adults with diabetes: <7.0          Passed - Valid encounter within last 6 months    Recent Outpatient Visits           6 days ago History of mechanical aortic valve replacement   [place] [facility] [place], Corrie Dandy T, NP   1 week ago Acute URI   [place] [facility] [place], Sherran Needs, NP   3 weeks ago History of mechanical aortic valve replacement   [place] [facility] [place], Grant T, NP   1 month ago History of mechanical aortic valve replacement   [place] [facility] [place], Kermit T, NP   2 months ago Type 2 diabetes mellitus with proteinuria (HCC)   [place] [facility] [place], [name], NP       Future Appointments             In 1 week [place], [name], NP [facility], [facility]

## 2023-03-26 NOTE — Patient Instructions (Signed)
Prothrombin Time, International Normalized Ratio Test Why am I having this test? A prothrombin time (pro-time, PT) and international normalized ratio (INR) test measures how long it takes your blood to clot. You may have this test if: You have certain medical conditions that cause abnormal bleeding or blood clotting. These can include: Liver disease. Systemic infection (sepsis). Bleeding disorders that are passed from parent to child (inherited). You are taking a medicine to prevent excessive blood clotting (anticoagulant), such as warfarin. If you are taking warfarin, you will likely be asked to have this test done at regular intervals. The results of this test will help your health care provider determine what dose of warfarin you need based on how quickly or slowly your blood clots. It is very important to have this test done as often as your health care provider recommends. What is being tested? A prothrombin time (pro-time, PT) test measures how many seconds it takes your blood to clot. The international normalized ratio (INR) is a calculation of blood clotting time based on your PT result. Most labs report both PT and INR values when reporting blood clotting times. What kind of sample is taken?  A blood sample is required for this test. It is usually collected by inserting a needle into a blood vessel. It may also be done by taking a blood sample from your finger (fingerstick). Tell a health care provider about: Any bleeding problems you have. All medicines you are taking, including vitamins, herbs, eye drops, creams, and over-the-counter medicines. Do not stop, add, or change any medicines without letting your health care provider know. The foods you regularly eat, especially foods that contain moderate or high amounts of vitamin K. It is important to eat a consistent amount of foods rich in vitamin K. Let your health care provider know if you have recently changed your diet. If you drink  alcohol. This can affect your lab results. How are the results reported? Your test results will be reported as values. Your health care provider will compare your results to normal ranges that were established after testing a large group of people (reference ranges). Reference ranges may vary among labs and hospitals. For this test, common reference ranges are: Without anticoagulant treatment (control value): 11.0-12.5 seconds; 85-100%. INR: 0.8-1.1. If you are taking warfarin, talk with your health care provider about what your INR result should be. Generally, an INR of 2.0-3.0 is desired for blood clot prevention. This depends on your medical conditions. What do the results mean? A higher than normal PT or INR means that your blood takes longer to form a clot. This can result from: Certain medicines, such as antibiotics or warfarin. Liver disease. Lack of certain proteins that form clots (coagulation factors). Lack of some vitamins. A lower than normal PT or INR means that your blood can form a clot easily. This can result from: Supplements that contain vitamin K. Medicines that contain estrogen, such as birth control pills or hormone replacement. Cancer. If you are taking warfarin or another anticoagulant, your results may be different. Talk with your health care provider about what your test results should be and what they mean. Questions to ask your health care provider Ask your health care provider or the department that is doing the test: When will my results be ready? How will I get my results? What are my treatment options? What other tests do I need? What are my next steps? Summary A prothrombin time (pro-time, PT) test measures how many seconds  it takes your blood to clot. The international normalized ratio (INR) is a calculation of blood clotting time based on your PT result. You may have this test if you have a medical condition that causes abnormal bleeding or blood clotting,  or if you are taking a medicine to prevent abnormal blood clotting. A test result that is higher than normal indicates that your blood is taking longer to form a clot. This result may occur because you lack some vitamins, take certain medicines, or have certain medical conditions. If you are taking warfarin, talk with your health care provider about what your result should be. Talk with your health care provider about what your results mean. This information is not intended to replace advice given to you by your health care provider. Make sure you discuss any questions you have with your health care provider. Document Revised: 11/12/2020 Document Reviewed: 11/12/2020 Elsevier Patient Education  2024 [employer].

## 2023-03-31 ENCOUNTER — Encounter: Payer: Self-pay | Admitting: Nurse Practitioner

## 2023-03-31 ENCOUNTER — Ambulatory Visit (INDEPENDENT_AMBULATORY_CARE_PROVIDER_SITE_OTHER): Payer: 59 | Admitting: Nurse Practitioner

## 2023-03-31 VITALS — BP 136/72 | HR 73 | Temp 97.9°F | Wt 261.0 lb

## 2023-03-31 DIAGNOSIS — Z952 Presence of prosthetic heart valve: Secondary | ICD-10-CM | POA: Diagnosis not present

## 2023-03-31 DIAGNOSIS — Z7901 Long term (current) use of anticoagulants: Secondary | ICD-10-CM | POA: Diagnosis not present

## 2023-03-31 LAB — COAGUCHEK XS/INR WAIVED
INR: 2.7 — ABNORMAL HIGH (ref 0.9–1.1)
Prothrombin Time: 32.8 s

## 2023-03-31 NOTE — Progress Notes (Signed)
   BP 136/72 (BP Location: Left Arm, Patient Position: Sitting)   Pulse 73   Temp 97.9 F (36.6 C) (Oral)   Wt 261 lb (118.4 kg)   SpO2 97%   BMI 37.88 kg/m    Subjective:    Patient ID: Rodney Chary [name], male    DOB: 30-Jun-1960, 62 y.o.   MRN: [id number]  CC: Coumadin management  HPI: This patient is a 62 y.o. male who presents for coumadin management. The expected duration of coumadin treatment is lifelong The reason for anticoagulation is  mechanical heart valve. Follows with cardiology, Dr. Darrold Junker, last on 03/17/2023, no changes.  Echo 03/13/2020 with LVEF greater then 55%, stable valve.    Present Coumadin dose: 7.5 MG daily Goal: 2.5-3.5 The patient does not have an active anticoagulation episode. Excessive bruising: no Nose bleeding: no Rectal bleeding: no Prolonged menstrual cycles: N/A Eating diet with consistent amounts of foods containing Vitamin K:no Any recent antibiotic use? yes  Relevant past medical, surgical, family and social history reviewed and updated as indicated. Interim medical history since our last visit reviewed. Allergies and medications reviewed and updated.  ROS: Per HPI unless specifically indicated above     Objective:    BP 136/72 (BP Location: Left Arm, Patient Position: Sitting)   Pulse 73   Temp 97.9 F (36.6 C) (Oral)   Wt 261 lb (118.4 kg)   SpO2 97%   BMI 37.88 kg/m   Wt Readings from Last 3 Encounters:  03/31/23 261 lb (118.4 kg)  03/17/23 258 lb 6.4 oz (117.2 kg)  03/10/23 263 lb (119.3 kg)     General: Well appearing, well nourished in no distress.  Normal mood and affect. Skin: No excessive bruising or rash  Last INR: 2.3    Last CBC:  Lab Results  Component Value Date   WBC 9.9 04/11/2022   HGB 16.1 04/11/2022   HCT 50.0 04/11/2022   MCV 87.1 04/11/2022   PLT 191 04/11/2022    Results for orders placed or performed in visit on 03/31/23  CoaguChek XS/INR Waived  Result Value Ref Range   INR 2.7 (H) 0.9 -  1.1   Prothrombin Time 32.8 sec       Assessment:     ICD-10-CM   1. Current use of long term anticoagulation  Z79.01 CoaguChek XS/INR Waived    2. History of mechanical aortic valve replacement  Z95.2 CoaguChek XS/INR Waived      Plan:   Discussed current plan face-to-face with patient. For coumadin dosing, elected to continue 7.5 MG daily. Will plan to recheck INR in 4 weeks.

## 2023-03-31 NOTE — Assessment & Plan Note (Signed)
Chronic, ongoing with Coumadin use.  INR today 2.7, at 2.5 to 3.5 goal. Continue Coumadin 7.5 MG daily.  Not to take Ibuprofen due to risk for bleeding with this and only use Tylenol or OTC Voltaren gel or Icy/Hot patches.  Remain off Fenofibrate. Return sooner to office if any bleeding or concerns.

## 2023-03-31 NOTE — Assessment & Plan Note (Signed)
Refer to mechanical valve plan of care.

## 2023-04-23 NOTE — Patient Instructions (Addendum)
Start taking Coumadin 7.5 MG 5 days a week and 8 MG twice a week.  Diabetes Mellitus and Foot Care Diabetes, also called diabetes mellitus, may cause problems with your feet and legs because of poor blood flow (circulation). Poor circulation may make your skin: Become thinner and drier. Break more easily. Heal more slowly. Peel and crack. You may also have nerve damage (neuropathy). This can cause decreased feeling in your legs and feet. This means that you may not notice minor injuries to your feet that could lead to more serious problems. Finding and treating problems early is the best way to prevent future foot problems. How to care for your feet Foot hygiene  Wash your feet daily with warm water and mild soap. Do not use hot water. Then, pat your feet and the areas between your toes until they are fully dry. Do not soak your feet. This can dry your skin. Trim your toenails straight across. Do not dig under them or around the cuticle. File the edges of your nails with an emery board or nail file. Apply a moisturizing lotion or petroleum jelly to the skin on your feet and to dry, brittle toenails. Use lotion that does not contain alcohol and is unscented. Do not apply lotion between your toes. Shoes and socks Wear clean socks or stockings every day. Make sure they are not too tight. Do not wear knee-high stockings. These may decrease blood flow to your legs. Wear shoes that fit well and have enough cushioning. Always look in your shoes before you put them on to be sure there are no objects inside. To break in new shoes, wear them for just a few hours a day. This prevents injuries on your feet. Wounds, scrapes, corns, and calluses  Check your feet daily for blisters, cuts, bruises, sores, and redness. If you cannot see the bottom of your feet, use a mirror or ask someone for help. Do not cut off corns or calluses or try to remove them with medicine. If you find a minor scrape, cut, or break  in the skin on your feet, keep it and the skin around it clean and dry. You may clean these areas with mild soap and water. Do not clean the area with peroxide, alcohol, or iodine. If you have a wound, scrape, corn, or callus on your foot, look at it several times a day to make sure it is healing and not infected. Check for: Redness, swelling, or pain. Fluid or blood. Warmth. Pus or a bad smell. General tips Do not cross your legs. This may decrease blood flow to your feet. Do not use heating pads or hot water bottles on your feet. They may burn your skin. If you have lost feeling in your feet or legs, you may not know this is happening until it is too late. Protect your feet from hot and cold by wearing shoes, such as at the beach or on hot pavement. Schedule a complete foot exam at least once a year or more often if you have foot problems. Report any cuts, sores, or bruises to your health care provider right away. Where to find more information American Diabetes Association: diabetes.org Association of Diabetes Care & Education Specialists: diabeteseducator.org Contact a health care provider if: You have a condition that increases your risk of infection, and you have any cuts, sores, or bruises on your feet. You have an injury that is not healing. You have redness on your legs or feet. You feel  burning or tingling in your legs or feet. You have pain or cramps in your legs and feet. Your legs or feet are numb. Your feet always feel cold. You have pain around any toenails. Get help right away if: You have a wound, scrape, corn, or callus on your foot and: You have signs of infection. You have a fever. You have a red line going up your leg. This information is not intended to replace advice given to you by your health care provider. Make sure you discuss any questions you have with your health care provider. Document Revised: 10/27/2021 Document Reviewed: 10/27/2021 Elsevier Patient  Education  2024 [employer].

## 2023-04-28 ENCOUNTER — Encounter: Payer: Self-pay | Admitting: Nurse Practitioner

## 2023-04-28 ENCOUNTER — Ambulatory Visit (INDEPENDENT_AMBULATORY_CARE_PROVIDER_SITE_OTHER): Payer: 59 | Admitting: Nurse Practitioner

## 2023-04-28 VITALS — BP 127/85 | HR 76 | Temp 97.7°F | Ht 69.5 in | Wt 260.0 lb

## 2023-04-28 DIAGNOSIS — N400 Enlarged prostate without lower urinary tract symptoms: Secondary | ICD-10-CM | POA: Diagnosis not present

## 2023-04-28 DIAGNOSIS — Z7901 Long term (current) use of anticoagulants: Secondary | ICD-10-CM

## 2023-04-28 DIAGNOSIS — E785 Hyperlipidemia, unspecified: Secondary | ICD-10-CM | POA: Diagnosis not present

## 2023-04-28 DIAGNOSIS — E1142 Type 2 diabetes mellitus with diabetic polyneuropathy: Secondary | ICD-10-CM

## 2023-04-28 DIAGNOSIS — Z952 Presence of prosthetic heart valve: Secondary | ICD-10-CM

## 2023-04-28 DIAGNOSIS — Z794 Long term (current) use of insulin: Secondary | ICD-10-CM

## 2023-04-28 DIAGNOSIS — D6869 Other thrombophilia: Secondary | ICD-10-CM

## 2023-04-28 DIAGNOSIS — I152 Hypertension secondary to endocrine disorders: Secondary | ICD-10-CM | POA: Diagnosis not present

## 2023-04-28 DIAGNOSIS — E1159 Type 2 diabetes mellitus with other circulatory complications: Secondary | ICD-10-CM | POA: Diagnosis not present

## 2023-04-28 DIAGNOSIS — E1169 Type 2 diabetes mellitus with other specified complication: Secondary | ICD-10-CM

## 2023-04-28 DIAGNOSIS — I482 Chronic atrial fibrillation, unspecified: Secondary | ICD-10-CM | POA: Diagnosis not present

## 2023-04-28 DIAGNOSIS — R809 Proteinuria, unspecified: Secondary | ICD-10-CM | POA: Diagnosis not present

## 2023-04-28 DIAGNOSIS — Z91148 Patient's other noncompliance with medication regimen for other reason: Secondary | ICD-10-CM

## 2023-04-28 DIAGNOSIS — E1129 Type 2 diabetes mellitus with other diabetic kidney complication: Secondary | ICD-10-CM | POA: Diagnosis not present

## 2023-04-28 LAB — BAYER DCA HB A1C WAIVED: HB A1C (BAYER DCA - WAIVED): 11.6 % — ABNORMAL HIGH (ref 4.8–5.6)

## 2023-04-28 LAB — COAGUCHEK XS/INR WAIVED
INR: 1.5 — ABNORMAL HIGH (ref 0.9–1.1)
Prothrombin Time: 18.4 s

## 2023-04-28 MED ORDER — EMPAGLIFLOZIN 25 MG PO TABS: 25.0000 mg | ORAL_TABLET | Freq: Every day | ORAL | 11 refills | Status: DC

## 2023-04-28 NOTE — Assessment & Plan Note (Signed)
Ongoing issue, have had many lengthy discussions with patient about this.  He is currently only taking NPH insulin and did not alert provider that Jardiance nor Ozempic were at pharmacy.  Not checking sugars at home.  Refuses to use Trulicity or return to long acting insulin with meal time insulin.  Refuses to return to endocrinology, as was discharged from local one.  Poor eating habits.  [facility].

## 2023-04-28 NOTE — Assessment & Plan Note (Signed)
Chronic, ongoing with Coumadin use.  INR today 1.5 which is trend down, at 2.5 to 3.5 goal. Increase Coumadin to 7.5 MG five days a week and 8 MG two days a week.  Not to take Ibuprofen due to risk for bleeding with this and only use Tylenol or OTC Voltaren gel or Icy/Hot patches.  Remain off Fenofibrate. Return in 2 weeks for recheck.

## 2023-04-28 NOTE — Assessment & Plan Note (Signed)
Refer to diabetes with CKD plan of care.  Continue Gabapentin and renal dose as needed in future.

## 2023-04-28 NOTE — Assessment & Plan Note (Addendum)
Chronic, stable.  No medications.  PSA today.

## 2023-04-28 NOTE — Progress Notes (Signed)
BP 127/85   Pulse 76   Temp 97.7 F (36.5 C) (Oral)   Ht 5' 9.5" (1.765 m)   Wt 260 lb (117.9 kg)   SpO2 93%   BMI 37.84 kg/m    Subjective:    Patient ID: Rodney Chary [name], male    DOB: 08-01-1960, 62 y.o.   MRN: [id number]  HPI: Rodney Dixon. is a 62 y.o. male  Chief Complaint  Patient presents with   Diabetes    No recent eye exam per patient   Hyperlipidemia   Hypertension   DIABETES A1c September was 11.3% due to not taking medications consistently.  Last saw endocrinology, Dr. Everardo All, 01/26/21, have advised him to return which he has not. To be taking Jardiance 25 MG (not taking, he reports they have never filled), NPH (currently taking 60 units of this), Ozempic 0.5 MG (he is not taking as insurance not covering, he refuses to use Trulicity). Is working with Sabino Niemann PharmD to help with adherence and any medication assistance.  He is an "eater" and endorses enjoying sweets.  He also discussed that he knows much of poor control is his fault and in new year he wants to change diet ways.  On visit 08/27/21 placed referral to Dr. Patrecia Pace locally, as patient reported it would be easier to have provider here and [facility] discharged him from practice in past.  Dr. Patrecia Pace retired. Metformin in past caused kidney issues and once stopped his kidneys improved.  Continues to take Gabapentin, currently taking only 1200 MG before bed -- does not take day time doses. Hypoglycemic episodes: no Polydipsia/polyuria: no Visual disturbance: no Chest pain: no Paresthesias: no Glucose Monitoring: yes  Accucheck frequency: not checking  Fasting glucose:   Post prandial:  Evening:  Before meals: Taking Insulin?: yes NPH 60 units  Long acting insulin:  Short acting insulin:  Blood Pressure Monitoring: not checking Retinal Examination: Not up to Date Foot Exam: Up to Date Pneumovax: Up to Date Influenza: Not Up To Date Aspirin: no   HYPERTENSION /  HYPERLIPIDEMIA Taking Benazepril, HCTZ, Amlodipine, Carvedilol, and Atorvastatin. Echo noted normal LV function and EF >55% 03/13/2020. Saw cardiology 03/17/23 with no changes made. Duration of hypertension: chronic BP monitoring frequency: not checking BP range:  BP medication side effects: no Duration of hyperlipidemia: chronic Cholesterol medication side effects: no Cholesterol supplements: none Medication compliance: good compliance Aspirin: no Recent stressors: no Recurrent headaches: no Visual changes: no Palpitations: no Dyspnea: no Chest pain: no Lower extremity edema: no Dizzy/lightheaded: yes  ATRIAL FIBRILLATION Taking 7.5 MG Coumadin daily with recent INR 2.7. History of stroke and saw neurology last 03/30/21.  CT remains reassuring on 05/12/21. Has intermittent dizziness with this. Atrial fibrillation status: stable Satisfied with current treatment: yes  Medication side effects:  no Medication compliance: good compliance Etiology of atrial fibrillation: unknown Palpitations:  no Chest pain:  no Dyspnea on exertion:  no Orthopnea:  no Syncope:  no Edema:  no Ventricular rate control: B-blocker Anti-coagulation: warfarin   Relevant past medical, surgical, family and social history reviewed and updated as indicated. Interim medical history since our last visit reviewed. Allergies and medications reviewed and updated.  Review of Systems  Constitutional:  Negative for activity change, diaphoresis, fatigue and fever.  Respiratory:  Negative for cough, chest tightness, shortness of breath and wheezing.   Cardiovascular:  Negative for chest pain, palpitations and leg swelling.  Gastrointestinal: Negative.   Endocrine: Negative for polydipsia, polyphagia and  polyuria.  Genitourinary:  Negative for urgency.  Neurological:  Negative for dizziness, facial asymmetry, speech difficulty, weakness and headaches.  Psychiatric/Behavioral: Negative.     Per HPI unless  specifically indicated above     Objective:    BP 127/85   Pulse 76   Temp 97.7 F (36.5 C) (Oral)   Ht 5' 9.5" (1.765 m)   Wt 260 lb (117.9 kg)   SpO2 93%   BMI 37.84 kg/m   Wt Readings from Last 3 Encounters:  04/28/23 260 lb (117.9 kg)  03/31/23 261 lb (118.4 kg)  03/17/23 258 lb 6.4 oz (117.2 kg)    Physical Exam Vitals and nursing note reviewed.  Constitutional:      General: He is awake. He is not in acute distress.    Appearance: He is well-developed and well-groomed. He is morbidly obese. He is not ill-appearing.  HENT:     Head: Normocephalic and atraumatic.     Right Ear: Hearing normal. No drainage.     Left Ear: Hearing normal. No drainage.  Eyes:     General: Lids are normal.        Right eye: No discharge.        Left eye: No discharge.     Conjunctiva/sclera: Conjunctivae normal.     Pupils: Pupils are equal, round, and reactive to light.  Neck:     Thyroid: No thyromegaly.     Vascular: No carotid bruit or JVD.  Cardiovascular:     Rate and Rhythm: Normal rate and regular rhythm.     Heart sounds: Normal heart sounds, S1 normal and S2 normal. No murmur heard.    No gallop.  Pulmonary:     Effort: Pulmonary effort is normal. No accessory muscle usage or respiratory distress.     Breath sounds: Normal breath sounds.  Abdominal:     General: Bowel sounds are normal.     Palpations: Abdomen is soft.  Musculoskeletal:        General: Normal range of motion.     Right shoulder: Normal.     Left shoulder: Normal.     Cervical back: Normal range of motion and neck supple.     Right lower leg: No edema.     Left lower leg: No edema.  Skin:    General: Skin is warm and dry.     Capillary Refill: Capillary refill takes less than 2 seconds.     Findings: No rash.  Neurological:     Mental Status: He is alert and oriented to person, place, and time.     Cranial Nerves: Cranial nerves 2-12 are intact.     Motor: Motor function is intact.      Coordination: Coordination is intact.     Gait: Gait is intact.     Deep Tendon Reflexes: Reflexes are normal and symmetric.  Psychiatric:        Attention and Perception: Attention normal.        Mood and Affect: Mood normal.        Speech: Speech normal.        Behavior: Behavior normal. Behavior is cooperative.        Thought Content: Thought content normal.    Diabetic Foot Exam - Simple   Simple Foot Form Visual Inspection See comments: Yes Sensation Testing See comments: Yes Pulse Check Posterior Tibialis and Dorsalis pulse intact bilaterally: Yes Comments Dry feet.  Sensation decreased.     Results for orders placed or  performed in visit on 04/28/23  Bayer DCA Hb A1c Waived   Collection Time: 04/28/23  2:19 PM  Result Value Ref Range   HB A1C (BAYER DCA - WAIVED) 11.6 (H) 4.8 - 5.6 %  CoaguChek XS/INR Waived   Collection Time: 04/28/23  2:19 PM  Result Value Ref Range   INR 1.5 (H) 0.9 - 1.1   Prothrombin Time 18.4 sec      Assessment & Plan:   Problem List Items Addressed This Visit       Cardiovascular and Mediastinum   Chronic atrial fibrillation (HCC) (Chronic)   Chronic, ongoing with Coumadin use.  Continue collaboration with cardiology, last note reviewed.  Continue all medications.  Plan on recheck INR in 2 weeks.      Hypertension associated with type 2 diabetes mellitus (HCC) (Chronic)   Chronic, ongoing.  BP at goal today.  Continue Benazepril, HCTZ, Coreg + Amlodipine, educated him on this medication and use + side effects.  Highly recommend he monitor BP at home at least a few mornings a week + focus on DASH diet.  Discussed with him stroke prevention goal for BP <130/80.  LABS: CMP, TSH.  Continue cardiology collaboration.  Continue to collaborate with [facility] PharmD.        Relevant Medications   empagliflozin (JARDIANCE) 25 MG TABS tablet   Other Relevant Orders   Bayer DCA Hb A1c Waived (Completed)   CBC with Differential/Platelet    Comprehensive metabolic panel   TSH     Endocrine   Hyperlipidemia associated with type 2 diabetes mellitus (HCC) (Chronic)   Chronic, ongoing.  Continue current medication regimen and adjust as needed.  Lipid panel today.      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  Continue Gabapentin and renal dose as needed in future.      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  Poorly controlled due to non adherence and poor diet.  A1c today 11.6%, last was 11.3%.  He did not alert provider that Jardiance and Ozempic were not at pharmacy, is only taking NPH.  Has been discharged from local endocrinologist and does not want to go back to [place].  Refuses to trial a return to long acting insulin with meal time.  Refuses to take Trulicity.  Cannot take Metformin as caused reduced kidney function.  Cannot take Glipizide due to risk for interaction with Coumadin. For now continue NPH 60 units and have reached out to [facility] PharmD on Jardiance assist and possibly assist for either Ozempic or Rybelsus. Discussed at length his very high risk with poor diabetes control - we have had multiple discussions on this.   - Refuses vaccinations - ACE and statin on board - Needs eye exam.  Foot exam up to date.      Relevant Medications   empagliflozin (JARDIANCE) 25 MG TABS tablet   Other Relevant Orders   Bayer DCA Hb A1c Waived (Completed)   Comprehensive metabolic panel  Genitourinary   Benign prostatic hyperplasia without lower urinary  tract symptoms   Chronic, stable.  No medications.  PSA today.      Relevant Orders   PSA     Hematopoietic and Hemostatic   Acquired thrombophilia (HCC) (Chronic)   Monitor CBC with use of daily Coumadin for mechanical valve and a-fib.  Denies any current bleeding episodes.  Check CBC annually, December.      Relevant Orders   CBC with Differential/Platelet     Other   Current use of long term anticoagulation (Chronic)   Refer to mechanical valve plan of care.      Relevant Orders   CoaguChek XS/INR Waived (Completed)   History of mechanical aortic valve replacement (Chronic)   Chronic, ongoing with Coumadin use.  INR today 1.5 which is trend down, at 2.5 to 3.5 goal. Increase Coumadin to 7.5 MG five days a week and 8 MG two days a week.  Not to take Ibuprofen due to risk for bleeding with this and only use Tylenol or OTC Voltaren gel or Icy/Hot patches.  Remain off Fenofibrate. Return in 2 weeks for recheck.      Relevant Orders   CoaguChek XS/INR Waived (Completed)   Long-term insulin use (HCC) (Chronic)   Chronic, ongoing.  Refer to diabetes plan of care for further.      Relevant Orders   Bayer DCA Hb A1c Waived (Completed)   Morbid obesity (HCC) (Chronic)   BMI 37.84 with T2DM, HTN/HLD.  Recommended eating smaller high protein, low fat meals more frequently and exercising 30 mins a day 5 times a week with a goal of 10-15lb weight loss in the next 3 months. Patient voiced their understanding and motivation to adhere to these recommendations.       Relevant Medications   empagliflozin (JARDIANCE) 25 MG TABS tablet   Noncompliance w/medication treatment due to intermit use of medication (Chronic)   Ongoing issue, have had many lengthy discussions with patient about this.  He is currently only taking NPH insulin and did not alert provider that Jardiance nor Ozempic were at pharmacy.  Not checking sugars at home.  Refuses to use Trulicity or return to long acting insulin  with meal time insulin.  Refuses to return to endocrinology, as was discharged from local one.  Poor eating habits.  Continue to work with [facility] PharmD.        Follow up plan: Return in about 2 weeks (around 05/12/2023) for INR CHECK.

## 2023-04-28 NOTE — Assessment & Plan Note (Signed)
BMI 37.84 with T2DM, HTN/HLD.  Recommended eating smaller high protein, low fat meals more frequently and exercising 30 mins a day 5 times a week with a goal of 10-15lb weight loss in the next 3 months. Patient voiced their understanding and motivation to adhere to these recommendations.

## 2023-04-28 NOTE — Assessment & Plan Note (Signed)
Refer to mechanical valve plan of care. 

## 2023-04-28 NOTE — Assessment & Plan Note (Signed)
Monitor CBC with use of daily Coumadin for mechanical valve and a-fib.  Denies any current bleeding episodes.  Check CBC annually, December.

## 2023-04-28 NOTE — Assessment & Plan Note (Signed)
Chronic, ongoing.  Continue current medication regimen and adjust as needed. Lipid panel today. 

## 2023-04-28 NOTE — Assessment & Plan Note (Signed)
Chronic, ongoing.  Poorly controlled due to non adherence and poor diet.  A1c today 11.6%, last was 11.3%.  He did not alert provider that Jardiance and Ozempic were not at pharmacy, is only taking NPH.  Has been discharged from local endocrinologist and does not want to go back to [place].  Refuses to trial a return to long acting insulin with meal time.  Refuses to take Trulicity.  Cannot take Metformin as caused reduced kidney function.  Cannot take Glipizide due to risk for interaction with Coumadin. For now continue NPH 60 units and have reached out to [facility] PharmD on Jardiance assist and possibly assist for either Ozempic or Rybelsus. Discussed at length his very high risk with poor diabetes control - we have had multiple discussions on this.   - Refuses vaccinations - ACE and statin on board - Needs eye exam.  Foot exam up to date.

## 2023-04-28 NOTE — Assessment & Plan Note (Signed)
Chronic, ongoing.  Refer to diabetes plan of care for further. 

## 2023-04-28 NOTE — Assessment & Plan Note (Signed)
Chronic, ongoing with Coumadin use.  Continue collaboration with cardiology, last note reviewed.  Continue all medications.  Plan on recheck INR in 2 weeks.

## 2023-04-28 NOTE — Assessment & Plan Note (Signed)
Chronic, ongoing.  BP at goal today.  Continue Benazepril, HCTZ, Coreg + Amlodipine, educated him on this medication and use + side effects.  Highly recommend he monitor BP at home at least a few mornings a week + focus on DASH diet.  Discussed with him stroke prevention goal for BP <130/80.  LABS: CMP, TSH.  Continue cardiology collaboration.  Continue to collaborate with [facility] PharmD.

## 2023-04-29 LAB — COMPREHENSIVE METABOLIC PANEL
ALT: 33 [IU]/L (ref 0–44)
AST: 20 [IU]/L (ref 0–40)
Albumin: 4.3 g/dL (ref 3.9–4.9)
Alkaline Phosphatase: 106 [IU]/L (ref 44–121)
BUN/Creatinine Ratio: 16 (ref 10–24)
BUN: 16 mg/dL (ref 8–27)
Bilirubin Total: 0.4 mg/dL (ref 0.0–1.2)
CO2: 25 mmol/L (ref 20–29)
Calcium: 9.9 mg/dL (ref 8.6–10.2)
Chloride: 100 mmol/L (ref 96–106)
Creatinine, Ser: 1.03 mg/dL (ref 0.76–1.27)
Globulin, Total: 2.9 g/dL (ref 1.5–4.5)
Glucose: 158 mg/dL — ABNORMAL HIGH (ref 70–99)
Potassium: 4.1 mmol/L (ref 3.5–5.2)
Sodium: 139 mmol/L (ref 134–144)
Total Protein: 7.2 g/dL (ref 6.0–8.5)
eGFR: 82 mL/min/{1.73_m2} (ref >59)

## 2023-04-29 LAB — CBC WITH DIFFERENTIAL/PLATELET
Basophils Absolute: 0.1 10*3/uL (ref 0.0–0.2)
Basos: 1 %
EOS (ABSOLUTE): 0.2 10*3/uL (ref 0.0–0.4)
Eos: 3 %
Hematocrit: 44.3 % (ref 37.5–51.0)
Hemoglobin: 14.7 g/dL (ref 13.0–17.7)
Immature Grans (Abs): 0.1 10*3/uL (ref 0.0–0.1)
Immature Granulocytes: 1 %
Lymphocytes Absolute: 2.3 10*3/uL (ref 0.7–3.1)
Lymphs: 26 %
MCH: 29 pg (ref 26.6–33.0)
MCHC: 33.2 g/dL (ref 31.5–35.7)
MCV: 87 fL (ref 79–97)
Monocytes Absolute: 0.6 10*3/uL (ref 0.1–0.9)
Monocytes: 6 %
Neutrophils Absolute: 5.6 10*3/uL (ref 1.4–7.0)
Neutrophils: 63 %
Platelets: 223 10*3/uL (ref 150–450)
RBC: 5.07 x10E6/uL (ref 4.14–5.80)
RDW: 12.7 % (ref 11.6–15.4)
WBC: 8.8 10*3/uL (ref 3.4–10.8)

## 2023-04-29 LAB — LIPID PANEL W/O CHOL/HDL RATIO
Cholesterol, Total: 130 mg/dL (ref 100–199)
HDL: 25 mg/dL — ABNORMAL LOW (ref >39)
LDL Chol Calc (NIH): 58 mg/dL (ref 0–99)
Triglycerides: 300 mg/dL — ABNORMAL HIGH (ref 0–149)
VLDL Cholesterol Cal: 47 mg/dL — ABNORMAL HIGH (ref 5–40)

## 2023-04-29 LAB — PSA: Prostate Specific Ag, Serum: 0.5 ng/mL (ref 0.0–4.0)

## 2023-04-29 LAB — TSH: TSH: 1 u[IU]/mL (ref 0.450–4.500)

## 2023-04-29 NOTE — Progress Notes (Signed)
Good morning, please let Rodney Dixon know his labs have returned: - Kidney and liver function are normal. - Lipid panel shows some elevation in triglycerides, but remainder is stable.  Continue statin therapy. - Remainder of labs are stable.  Any questions? Rodney Dixon will be reaching out to you about medication and what we can do as we need to get that A1c down. Keep being amazing!!  Thank you for allowing me to participate in your care.  I appreciate you. Kindest regards, Dmari Schubring

## 2023-05-04 ENCOUNTER — Telehealth: Payer: Self-pay

## 2023-05-05 ENCOUNTER — Other Ambulatory Visit: Payer: Self-pay

## 2023-05-05 ENCOUNTER — Other Ambulatory Visit: Payer: Self-pay | Admitting: Nurse Practitioner

## 2023-05-05 NOTE — Progress Notes (Signed)
ERROR duplicate

## 2023-05-05 NOTE — Progress Notes (Signed)
   05/05/2023  Patient ID: Rodney Chary [name], male   DOB: Jan 14, 1961, 62 y.o.   MRN: [id number]  S/O Telephone visit to follow-up on management of T2DM and adherence/affordability of medications  Diabetes Management -Current medications:  Novolin N Flexpen now using 60 units daily -Patient has been prescribed GLP1's in the past but could not maintain use due to cost -Recently prescribed Jardiance, but this was never picked up/started due to cost -Patient also states Novolin N has been $50/month- expecting $35 -A1c 12/20 11.6% -Patient declines basal/bolus insulin regimen -PCP does not wish to use sulfonylureas or TZD's due to warfarin use -Patient's insurance is currently active, and he is keeping tor he same plan for 2025  A/P  Diabetes Management -Uncontrolled -Contacted patient's insurance to assess any changes to plan for next year.  Patient will still have $4500 deductible that must be met before prescriptions are covered by the plan. -GLP1's (all per representative) are covered with a prior authorization; but copays will still be very expensive until deductible is met.  This is the same for SGLT2 medications. -There are currently no copay cards that pay more than $150-175 per month on GLP1's or SGLT2's.  I have seen copay card for Trulicity pay more, but patient has refused this medication in the past due to injection technique. -Certain insulins are included in $35 cap- Basaglar and Apidra are two of these, which could be options if patient open to basal/bolus regimen -Could see if Evaristo Bury copay card would take copay down drastically if patient agreeable to revisiting the medication -Other affordable options are limited to medications with generics available (sulfonylureas or TZD's).  If either initiated, I would recommend starting with low dose and titrating slowly.  BG and INR should be closely monitored if sulfonylurea started -Overall recommendation would be changing insulin to  Lantus daily and Apidra TID with meals; or changing long-acting insulin to Latus and re-trial of Trulicity if copay card made affordable  Follow-up:  12/30 to further discuss options with patient  Lenna Gilford, PharmD, DPLA

## 2023-05-08 ENCOUNTER — Other Ambulatory Visit: Payer: Self-pay | Admitting: Nurse Practitioner

## 2023-05-08 ENCOUNTER — Other Ambulatory Visit: Payer: Self-pay

## 2023-05-08 MED ORDER — INSULIN GLULISINE 100 UNIT/ML SOLOSTAR PEN: 5.0000 [IU] | PEN_INJECTOR | Freq: Three times a day (TID) | SUBCUTANEOUS | 2 refills | Status: DC

## 2023-05-08 MED ORDER — INSULIN PEN NEEDLE 32G X 4 MM MISC: 4 refills | Status: AC

## 2023-05-08 NOTE — Progress Notes (Signed)
   05/08/2023  Patient ID: Rodney Chary [name], male   DOB: 06/07/1960, 62 y.o.   MRN: [id number]  Telephone visit to follow-up on coverage/cost of diabetes medications to develop diabetes management plan.  -Informed patient that most cost affective approach with his insurance will be basal/bolus regimen using Basaglar once daily and Apidra TID with meals -Patient is agreeable to plan, but he did just pick up refill of Novolin N that he is currently using 60 units daily of -Recommend going ahead and starting Apidra at 5 units TID with meals- patient sees PCP again in 2 weeks, and insulin doses can be adjusted based on BG readings -Ultimately recommend 50:50 basal/bolus regimen to prevent over-basalization -Once basal dosing is adjusted, new prescription for Basaglar will need to be sent to pharmacy since Novolin N has not been $35 for patient -Updated directions for pen needles will need to be sent to the pharmacy- order pending for PCP to sign if in agreement  Telephone follow-up scheduled with patient in 4 weeks  Lenna Gilford, PharmD, DPLA

## 2023-05-10 NOTE — Telephone Encounter (Signed)
 Meclizine  dc'd 11/18/22 Amlodipine  04/19/22 #90 4 RF (should have enough until 07/2023 Requested Prescriptions  Refused Prescriptions Disp Refills   meclizine  (ANTIVERT ) 12.5 MG tablet [Pharmacy Med Name: MECLIZINE  12.5 MG TABLET] 30 tablet 5    Sig: TAKE 1 TABLET BY MOUTH 3 TIMES DAILY AS NEEDED FOR DIZZINESS.     Not Delegated - Gastroenterology: Antiemetics Failed - 05/10/2023 12:27 PM      Failed - This refill cannot be delegated      Passed - Valid encounter within last 6 months    Recent Outpatient Visits           1 week ago Type 2 diabetes mellitus with proteinuria (HCC)   [place] [facility], Melanie T, NP   1 month ago Current use of long term anticoagulation   [place] [facility] [place], [place] T, NP   1 month ago History of mechanical aortic valve replacement   [place] [facility] [place], Melanie T, NP   2 months ago Acute URI   [place] [facility], Hyla Givens, NP   2 months ago History of mechanical aortic valve replacement   [facility] [place], Melanie DASEN, NP       Future Appointments             In 1 week Cannady, Jolene T, NP [place] [facility]             amLODipine  (NORVASC ) 5 MG tablet [Pharmacy Med Name: AMLODIPINE  BESYLATE 5 MG TAB] 30 tablet 14    Sig: TAKE 1 TABLET (5 MG TOTAL) BY MOUTH DAILY.     Cardiovascular: Calcium  Channel Blockers 2 Passed - 05/10/2023 12:27 PM      Passed - Last BP in normal range    BP Readings from Last 1 Encounters:  04/28/23 127/85         Passed - Last Heart Rate in normal range    Pulse Readings from Last 1 Encounters:  04/28/23 76         Passed - Valid encounter within last 6 months    Recent Outpatient Visits           1 week ago Type 2 diabetes mellitus with proteinuria (HCC)   [place] [facility] [place], Melanie T, NP   1 month ago Current use of long term  anticoagulation   [place] [facility] [place], Melanie T, NP   1 month ago History of mechanical aortic valve replacement   [place] [facility] [place], Melanie T, NP   2 months ago Acute URI   [place] [facility] [place], Hyla Givens, NP   2 months ago History of mechanical aortic valve replacement   [place] [facility] [place], Melanie DASEN, NP       Future Appointments             In 1 week Cannady, Jolene T, NP [place] [facility]

## 2023-05-12 NOTE — Telephone Encounter (Signed)
 Pharmacy comment: Alternative Requested:NOT COVERED.

## 2023-05-15 ENCOUNTER — Other Ambulatory Visit: Payer: Self-pay

## 2023-05-15 ENCOUNTER — Other Ambulatory Visit: Payer: Self-pay | Admitting: Nurse Practitioner

## 2023-05-15 MED ORDER — INSULIN GLULISINE 100 UNIT/ML SOLOSTAR PEN: PEN_INJECTOR | SUBCUTANEOUS | 2 refills | Status: DC

## 2023-05-15 MED ORDER — FIASP FLEXTOUCH 100 UNIT/ML ~~LOC~~ SOPN: 5.0000 [IU] | PEN_INJECTOR | Freq: Three times a day (TID) | SUBCUTANEOUS | 1 refills | Status: AC

## 2023-05-15 NOTE — Progress Notes (Signed)
   05/15/2023  Patient ID: Rodney ONEIDA Kobus [name], male   DOB: 01-21-1961, 63 y.o.   MRN: [id number]  Fiasp  prescription sent to [facility] along with manufacturer copay card information.  Contacted [facility], and the Fiasp  is going through on insurance; pharmacy states there is no copay.  Follow-up already scheduled with patient in 3 weeks.  Rodney Dixon, PharmD, DPLA

## 2023-05-15 NOTE — Telephone Encounter (Signed)
 Can medication be changed or should a PA be completed?

## 2023-05-15 NOTE — Progress Notes (Signed)
   05/15/2023  Patient ID: Rodney ONEIDA Kobus [name], male   DOB: 25-Apr-1961, 63 y.o.   MRN: [id number]  [facility] received request from [facility] to change Apidra , because insurance was not covering this medication.  It appears Admelog  or Fiasp  are the covered rapid acting insulins on the patient's plan.  Both insulins have copay cards to help with cost.  Provider has sent in prescription for Fiasp  to replace the Apidra .  Called [facility] to check on coverage/copay, but it also appears prescription was sent in again for Apidra  instead of Fiasp .  Pending order for PCP to sign for Fiasp  5 units TID with meals.  Rodney Dixon, PharmD, DPLA

## 2023-05-20 NOTE — Patient Instructions (Addendum)
 Check blood sugar every morning with goal <130 and two hours after a meal with goal <180.  Also check if you get dizzy + ensure drinking plenty of water daily while taking Jardiance .  Increase Coumadin  to 8 MG three days a week and 7.5 MG four days a week.  Vitamin K Foods and Warfarin Warfarin is a blood thinner (anticoagulant). Anticoagulant medicines help prevent blood clots from forming or getting bigger. Warfarin works by blocking the activity of vitamin K. Vitamin K promotes normal blood clotting. When you take warfarin, problems can occur from suddenly increasing or decreasing the amount of vitamin K that you eat from one day to the next. These problems can occur due to varying levels of warfarin in your blood. Problems may include blood clots or bleeding. What are tips for eating the right amount of vitamin K? Reading food labels Know which foods contain vitamin K. Read food labels. Use the lists below to understand serving sizes and the amount of vitamin K in one serving. If you take a multivitamin that contains vitamin K, be sure to take it every day. Meal planning To avoid problems when taking warfarin: Eat a balanced diet that includes: Fresh fruits and vegetables. Whole grains. Low-fat dairy products. Lean proteins, such as fish, eggs, and lean cuts of meat. Avoid major changes in your diet. If you are going to change your diet, talk with your health care provider before making changes. Keep your intake of vitamin K consistent from day to day. Avoid eating large amounts of vitamin K one day and small amounts of vitamin K the next day. Work with a dietitian to develop a meal plan that works best for you.  What foods are high in vitamin K? Foods that are high in vitamin K contain more than 100 mcg (micrograms) per serving. These include: Broccoli (cooked from fresh) -  cup (78 g) has 110 mcg. Brussels sprouts (cooked from fresh) -  cup (78 g) has 109 mcg. Greens, beet (cooked  from fresh) -  cup (72 g) has 350 mcg. Greens, collard (cooked from fresh) -  cup (66 g) has 263 mcg. Greens, turnip (cooked from fresh) -  cup (72 g) has 265 mcg. Green onions or scallions -  cup (50 g) has 105 mcg. Kale (cooked from fresh) -  cup (68 g) has 536 mcg. Parsley (raw) - 10 sprigs (10 g) has 164 mcg. Spinach (cooked from fresh) -  cup (90 g) has 444 mcg. Swiss chard (cooked from fresh) -  cup (88 g) has 287 mcg. The items listed above may not be a complete list of foods high in Vitamin K. Actual amounts of Vitamin K may differ depending on processing. Contact a dietitian for more information. What foods have a moderate amount of vitamin K? Foods that have a moderate amount of vitamin K contain 25-100 mcg per serving. These include: Asparagus (cooked from fresh) - 4 spears (60 g) have 30 mcg. Black-eyed peas (dried) -  cup (85 g) has 32 mcg. Cabbage (cooked from fresh) -  cup (78 g) has 84 mcg. Cabbage (raw) -  cup (35 g) has 26 mcg. Kiwi fruit - 1 medium (69 g) has 27 mcg. Lettuce (raw) - 1 cup (36 g) has 45 mcg. Okra (cooked from fresh) -  cup (80 g) has 32 mcg. Prunes (dried) - 5 prunes (47 g) have 25 mcg. Tuna, light, canned in oil - 3 oz (85 g) has 37 mcg. Watercress (raw) -  1 cup (34 g) has 85 mcg. The items listed above may not be a complete list of foods with a moderate amount of Vitamin K. Actual amounts of Vitamin K may differ depending on processing. Contact a dietitian for more information. What foods are low in vitamin K? Foods low in vitamin K contain less than 25 mcg per serving. These include: Artichoke - 1 medium (128 g) has 18 mcg. Avocado - 1 oz (21 g) has 6 mcg. Blueberries -  cup (73 g) has 14 mcg. Carrots (cooked from fresh) -  cup (78 g) has 11 mcg. Cauliflower (raw) -  cup (54 g) has 8 mcg. Cucumber with peel (raw) -  cup (52 g) has 9 mcg. Grapes -  cup (76 g) has 12 mcg. Mango - 1 medium (207 g) has 9 mcg. Mixed nuts - 1 cup (142 g)  has 17 mcg. Pear - 1 medium (178 g) has 8 mcg. Peas (cooked from fresh) -  cup (80 g) has 20 mcg. Pickled cucumber - 1 spear (65 g) has 11 mcg. Sauerkraut (canned) -  cup (118 g) has 16 mcg. Soybeans (cooked from fresh) -  cup (86 g) has 16 mcg. Tomato (raw) - 1 medium (123 g) has 10 mcg. Tomato sauce (raw) -  cup (123 g) has 17 mcg. The items listed above may not be a complete list of foods low in Vitamin K. Actual amounts of Vitamin K may differ depending on processing. Contact a dietitian for more information. What foods do not have vitamin K? If a food contains less than 5 mcg per serving, it is considered to have no vitamin K. These foods include: Bread and cereal products. Cheese. Eggs. Fish and shellfish. Meat and poultry. Milk and dairy products. Seeds, such as sunflower or pumpkin seeds. The items listed above may not be a complete list of foods that do not have vitamin K. Actual amounts of vitamin K may differ depending on processing. Contact a dietitian for more information. Summary Warfarin is an anticoagulant that prevents blood clots from forming or getting bigger by blocking the activity of vitamin K. It is important to know the amount of vitamin K that is in the foods you eat and to keep your intake of vitamin K consistent from day to day. Avoid major changes in your diet. If you are going to change your diet, talk with your health care provider before making changes. This information is not intended to replace advice given to you by your health care provider. Make sure you discuss any questions you have with your health care provider. Document Revised: 07/01/2020 Document Reviewed: 07/01/2020 Elsevier Patient Education  2024 [employer].

## 2023-05-22 ENCOUNTER — Ambulatory Visit (INDEPENDENT_AMBULATORY_CARE_PROVIDER_SITE_OTHER): Payer: 59 | Admitting: Nurse Practitioner

## 2023-05-22 ENCOUNTER — Encounter: Payer: Self-pay | Admitting: Nurse Practitioner

## 2023-05-22 VITALS — BP 133/78 | HR 78 | Temp 98.2°F | Resp 18 | Wt 254.8 lb

## 2023-05-22 DIAGNOSIS — Z952 Presence of prosthetic heart valve: Secondary | ICD-10-CM | POA: Diagnosis not present

## 2023-05-22 DIAGNOSIS — Z7901 Long term (current) use of anticoagulants: Secondary | ICD-10-CM | POA: Diagnosis not present

## 2023-05-22 DIAGNOSIS — R809 Proteinuria, unspecified: Secondary | ICD-10-CM

## 2023-05-22 DIAGNOSIS — Z794 Long term (current) use of insulin: Secondary | ICD-10-CM | POA: Diagnosis not present

## 2023-05-22 DIAGNOSIS — E1129 Type 2 diabetes mellitus with other diabetic kidney complication: Secondary | ICD-10-CM | POA: Diagnosis not present

## 2023-05-22 LAB — COAGUCHEK XS/INR WAIVED
INR: 1.9 — ABNORMAL HIGH (ref 0.9–1.1)
Prothrombin Time: 22.4 s

## 2023-05-22 MED ORDER — ATORVASTATIN CALCIUM 80 MG PO TABS: 80.0000 mg | ORAL_TABLET | Freq: Every day | ORAL | 2 refills | Status: DC

## 2023-05-22 MED ORDER — WARFARIN SODIUM 2.5 MG PO TABS: 2.5000 mg | ORAL_TABLET | Freq: Every day | ORAL | 4 refills | Status: DC

## 2023-05-22 MED ORDER — AMLODIPINE BESYLATE 5 MG PO TABS: 5.0000 mg | ORAL_TABLET | Freq: Every day | ORAL | 4 refills | Status: DC

## 2023-05-22 NOTE — Assessment & Plan Note (Signed)
 Refer to mechanical valve plan of care.

## 2023-05-22 NOTE — Assessment & Plan Note (Signed)
 Chronic, ongoing with Coumadin  use.  INR today 1.9 which is slight trend up, at 2.5 to 3.5 goal. Increase Coumadin  to 8 MG three days a week and 7.5 MG four days a week.  Not to take Ibuprofen due to risk for bleeding with this and only use Tylenol  or OTC Voltaren gel or Icy/Hot patches.  Remain off Fenofibrate . Return in 2 weeks for recheck.

## 2023-05-22 NOTE — Assessment & Plan Note (Addendum)
 Chronic, ongoing.  Poorly controlled due to non adherence and poor diet.  A1c recent 11.6%, last was 11.3%.  He did not alert provider that Jardiance  and Ozempic  were not at pharmacy, is only taking NPH.  Has been discharged from local endocrinologist and does not want to go back to [place].  Cannot take Metformin  as caused reduced kidney function.  Cannot take Glipizide due to risk for interaction with Coumadin . For now continue NPH 60 units and Jardiance  + collaboration with [facility] PharmD.  Jardiance  is offering benefit to his BP.  Recommend he hydrate well with this and check sugars if gets dizzy.  If BS 90 to 100 range then reduce insulin  dosing. Discussed at length his very high risk with poor diabetes control - we have had multiple discussions on this.   - Refuses vaccinations - ACE and statin on board - Needs eye exam.  Foot exam up to date.

## 2023-05-22 NOTE — Progress Notes (Signed)
 BP 133/78 (BP Location: Left Arm, Patient Position: Sitting)   Pulse 78   Temp 98.2 F (36.8 C) (Oral)   Resp 18   Wt 254 lb 12.8 oz (115.6 kg)   SpO2 91%   BMI 37.09 kg/m    Subjective:    Patient ID: Rodney ONEIDA Kobus [name], male    DOB: 01-23-1961, 63 y.o.   MRN: [id number]  CC: Coumadin  management  HPI: This patient is a 63 y.o. male who presents for coumadin  management. The expected duration of coumadin  treatment is lifelong The reason for anticoagulation is  mechanical heart valve. Follows with cardiology, Dr. Ammon, last on 03/17/2023, no changes.  Echo 03/13/2020 with LVEF greater then 55%, stable valve.    Present Coumadin  dose: 7.5 MG daily Goal: 2.5-3.5 The patient does not have an active anticoagulation episode. Excessive bruising: no Nose bleeding: no Rectal bleeding: no Prolonged menstrual cycles: N/A Eating diet with consistent amounts of foods containing Vitamin K:yes Any recent antibiotic use? no  Relevant past medical, surgical, family and social history reviewed and updated as indicated. Interim medical history since our last visit reviewed. Allergies and medications reviewed and updated.  ROS: Per HPI unless specifically indicated above     Objective:    BP 133/78 (BP Location: Left Arm, Patient Position: Sitting)   Pulse 78   Temp 98.2 F (36.8 C) (Oral)   Resp 18   Wt 254 lb 12.8 oz (115.6 kg)   SpO2 91%   BMI 37.09 kg/m   Wt Readings from Last 3 Encounters:  05/22/23 254 lb 12.8 oz (115.6 kg)  04/28/23 260 lb (117.9 kg)  03/31/23 261 lb (118.4 kg)     General: Well appearing, well nourished in no distress.  Normal mood and affect. Skin: No excessive bruising or rash  Last INR: 1.5    Last CBC:  Lab Results  Component Value Date   WBC 8.8 04/28/2023   HGB 14.7 04/28/2023   HCT 44.3 04/28/2023   MCV 87 04/28/2023   PLT 223 04/28/2023    Results for orders placed or performed in visit on 04/28/23  Bayer DCA Hb A1c Waived    Collection Time: 04/28/23  2:19 PM  Result Value Ref Range   HB A1C (BAYER DCA - WAIVED) 11.6 (H) 4.8 - 5.6 %  CoaguChek XS/INR Waived   Collection Time: 04/28/23  2:19 PM  Result Value Ref Range   INR 1.5 (H) 0.9 - 1.1   Prothrombin Time 18.4 sec  CBC with Differential/Platelet   Collection Time: 04/28/23  2:20 PM  Result Value Ref Range   WBC 8.8 3.4 - 10.8 x10E3/uL   RBC 5.07 4.14 - 5.80 x10E6/uL   Hemoglobin 14.7 13.0 - 17.7 g/dL   Hematocrit 55.6 62.4 - 51.0 %   MCV 87 79 - 97 fL   MCH 29.0 26.6 - 33.0 pg   MCHC 33.2 31.5 - 35.7 g/dL   RDW 87.2 88.3 - 84.5 %   Platelets 223 150 - 450 x10E3/uL   Neutrophils 63 Not Estab. %   Lymphs 26 Not Estab. %   Monocytes 6 Not Estab. %   Eos 3 Not Estab. %   Basos 1 Not Estab. %   Neutrophils Absolute 5.6 1.4 - 7.0 x10E3/uL   Lymphocytes Absolute 2.3 0.7 - 3.1 x10E3/uL   Monocytes Absolute 0.6 0.1 - 0.9 x10E3/uL   EOS (ABSOLUTE) 0.2 0.0 - 0.4 x10E3/uL   Basophils Absolute 0.1 0.0 - 0.2 x10E3/uL  Immature Granulocytes 1 Not Estab. %   Immature Grans (Abs) 0.1 0.0 - 0.1 x10E3/uL  Comprehensive metabolic panel   Collection Time: 04/28/23  2:20 PM  Result Value Ref Range   Glucose 158 (H) 70 - 99 mg/dL   BUN 16 8 - 27 mg/dL   Creatinine, Ser 8.96 0.76 - 1.27 mg/dL   eGFR 82 >40 fO/fpw/8.26   BUN/Creatinine Ratio 16 10 - 24   Sodium 139 134 - 144 mmol/L   Potassium 4.1 3.5 - 5.2 mmol/L   Chloride 100 96 - 106 mmol/L   CO2 25 20 - 29 mmol/L   Calcium  9.9 8.6 - 10.2 mg/dL   Total Protein 7.2 6.0 - 8.5 g/dL   Albumin 4.3 3.9 - 4.9 g/dL   Globulin, Total 2.9 1.5 - 4.5 g/dL   Bilirubin Total 0.4 0.0 - 1.2 mg/dL   Alkaline Phosphatase 106 44 - 121 IU/L   AST 20 0 - 40 IU/L   ALT 33 0 - 44 IU/L  TSH   Collection Time: 04/28/23  2:20 PM  Result Value Ref Range   TSH 1.000 0.450 - 4.500 uIU/mL  PSA   Collection Time: 04/28/23  2:20 PM  Result Value Ref Range   Prostate Specific Ag, Serum 0.5 0.0 - 4.0 ng/mL  Lipid Panel w/o  Chol/HDL Ratio   Collection Time: 04/28/23  2:20 PM  Result Value Ref Range   Cholesterol, Total 130 100 - 199 mg/dL   Triglycerides 699 (H) 0 - 149 mg/dL   HDL 25 (L) >60 mg/dL   VLDL Cholesterol Cal 47 (H) 5 - 40 mg/dL   LDL Chol Calc (NIH) 58 0 - 99 mg/dL       Assessment:     [id number]   1. History of mechanical aortic valve replacement  Z95.2 CoaguChek XS/INR Waived    2. Current use of long term anticoagulation  Z79.01 CoaguChek XS/INR Waived    3. Type 2 diabetes mellitus with proteinuria (HCC)  E11.29    R80.9       Plan:   Discussed current plan face-to-face with patient. For coumadin  dosing, elected to change dose to 8 MG three days a week and 7.5 MG four days a week. Will plan to recheck INR in 2 weeks.

## 2023-06-04 NOTE — Patient Instructions (Incomplete)
Vitamin K Foods and Warfarin Warfarin is a blood thinner (anticoagulant). Anticoagulant medicines help prevent blood clots from forming or getting bigger. Warfarin works by blocking the activity of vitamin K. Vitamin K promotes normal blood clotting. When you take warfarin, problems can occur from suddenly increasing or decreasing the amount of vitamin K that you eat from one day to the next. These problems can occur due to varying levels of warfarin in your blood. Problems may include blood clots or bleeding. What are tips for eating the right amount of vitamin K? Reading food labels Know which foods contain vitamin K. Read food labels. Use the lists below to understand serving sizes and the amount of vitamin K in one serving. If you take a multivitamin that contains vitamin K, be sure to take it every day. Meal planning To avoid problems when taking warfarin: Eat a balanced diet that includes: Fresh fruits and vegetables. Whole grains. Low-fat dairy products. Lean proteins, such as fish, eggs, and lean cuts of meat. Avoid major changes in your diet. If you are going to change your diet, talk with your health care provider before making changes. Keep your intake of vitamin K consistent from day to day. Avoid eating large amounts of vitamin K one day and small amounts of vitamin K the next day. Work with a dietitian to develop a meal plan that works best for you.  What foods are high in vitamin K? Foods that are high in vitamin K contain more than 100 mcg (micrograms) per serving. These include: Broccoli (cooked from fresh) -  cup (78 g) has 110 mcg. Brussels sprouts (cooked from fresh) -  cup (78 g) has 109 mcg. Greens, beet (cooked from fresh) -  cup (72 g) has 350 mcg. Greens, collard (cooked from fresh) -  cup (66 g) has 263 mcg. Greens, turnip (cooked from fresh) -  cup (72 g) has 265 mcg. Green onions or scallions -  cup (50 g) has 105 mcg. Kale (cooked from fresh) -  cup (68  g) has 536 mcg. Parsley (raw) - 10 sprigs (10 g) has 164 mcg. Spinach (cooked from fresh) -  cup (90 g) has 444 mcg. Swiss chard (cooked from fresh) -  cup (88 g) has 287 mcg. The items listed above may not be a complete list of foods high in Vitamin K. Actual amounts of Vitamin K may differ depending on processing. Contact a dietitian for more information. What foods have a moderate amount of vitamin K? Foods that have a moderate amount of vitamin K contain 25-100 mcg per serving. These include: Asparagus (cooked from fresh) - 4 spears (60 g) have 30 mcg. Black-eyed peas (dried) -  cup (85 g) has 32 mcg. Cabbage (cooked from fresh) -  cup (78 g) has 84 mcg. Cabbage (raw) -  cup (35 g) has 26 mcg. Kiwi fruit - 1 medium (69 g) has 27 mcg. Lettuce (raw) - 1 cup (36 g) has 45 mcg. Okra (cooked from fresh) -  cup (80 g) has 32 mcg. Prunes (dried) - 5 prunes (47 g) have 25 mcg. Tuna, light, canned in oil - 3 oz (85 g) has 37 mcg. Watercress (raw) - 1 cup (34 g) has 85 mcg. The items listed above may not be a complete list of foods with a moderate amount of Vitamin K. Actual amounts of Vitamin K may differ depending on processing. Contact a dietitian for more information. What foods are low in vitamin K? Foods low in  vitamin K contain less than 25 mcg per serving. These include: Artichoke - 1 medium (128 g) has 18 mcg. Avocado - 1 oz (21 g) has 6 mcg. Blueberries -  cup (73 g) has 14 mcg. Carrots (cooked from fresh) -  cup (78 g) has 11 mcg. Cauliflower (raw) -  cup (54 g) has 8 mcg. Cucumber with peel (raw) -  cup (52 g) has 9 mcg. Grapes -  cup (76 g) has 12 mcg. Mango - 1 medium (207 g) has 9 mcg. Mixed nuts - 1 cup (142 g) has 17 mcg. Pear - 1 medium (178 g) has 8 mcg. Peas (cooked from fresh) -  cup (80 g) has 20 mcg. Pickled cucumber - 1 spear (65 g) has 11 mcg. Sauerkraut (canned) -  cup (118 g) has 16 mcg. Soybeans (cooked from fresh) -  cup (86 g) has 16 mcg. Tomato  (raw) - 1 medium (123 g) has 10 mcg. Tomato sauce (raw) -  cup (123 g) has 17 mcg. The items listed above may not be a complete list of foods low in Vitamin K. Actual amounts of Vitamin K may differ depending on processing. Contact a dietitian for more information. What foods do not have vitamin K? If a food contains less than 5 mcg per serving, it is considered to have no vitamin K. These foods include: Bread and cereal products. Cheese. Eggs. Fish and shellfish. Meat and poultry. Milk and dairy products. Seeds, such as sunflower or pumpkin seeds. The items listed above may not be a complete list of foods that do not have vitamin K. Actual amounts of vitamin K may differ depending on processing. Contact a dietitian for more information. Summary Warfarin is an anticoagulant that prevents blood clots from forming or getting bigger by blocking the activity of vitamin K. It is important to know the amount of vitamin K that is in the foods you eat and to keep your intake of vitamin K consistent from day to day. Avoid major changes in your diet. If you are going to change your diet, talk with your health care provider before making changes. This information is not intended to replace advice given to you by your health care provider. Make sure you discuss any questions you have with your health care provider. Document Revised: 07/01/2020 Document Reviewed: 07/01/2020 Elsevier Patient Education  2024 [employer].

## 2023-06-05 ENCOUNTER — Other Ambulatory Visit: Payer: Self-pay

## 2023-06-05 NOTE — Progress Notes (Signed)
   06/05/2023  Patient ID: [name], male   DOB: 13-Jan-1961, 63 y.o.   MRN: [id number]  Subjective/objective Patient out reach to follow-up on control of type 2 diabetes  Diabetes management plan -Current medications: Jardiance 25 mg daily, Novolin N 60 units daily -Patient's insulin regimen was recently adjusted to Basaglar 60 units daily and Fiasp 5 units 3 times daily with meals, but patient is using the rest of his Novolin N FlexPen's on hand before switching to this regimen -Mr. Bolander states he has a 1 week supply left of the Novolin N flex pens -Patient endorses having Accu-[employer]k [employer] testing supplies at home, but he has not been checking home BG -A1c in December was 11.6%-this is up from 11.3% in September -Mr. Kluth endorses tolerating Jardiance 25 mg daily well, and he states he has lost about 6 pounds since using this medication -Patient previously on GLP-1 therapy, but affordability was a limiting factor to this based on patient co-pays with his insurance plan  Assessment/plan  Diabetes management plan -Currently uncontrolled with A1c > 7% -Informed Mr. Gruenwald [facility] and Fiasp-initiate once Novolin in flex pens have been used up.  Contact me if there are any issues in filling these medications. -Continue Jardiance 25 mg daily -Instructed patient to check fasting blood glucose daily as well as a 2-hour postprandial reading and record  Follow-up: 4 weeks  Lenna Gilford, PharmD, DPLA

## 2023-06-07 ENCOUNTER — Ambulatory Visit (INDEPENDENT_AMBULATORY_CARE_PROVIDER_SITE_OTHER): Payer: 59 | Admitting: Nurse Practitioner

## 2023-06-07 ENCOUNTER — Encounter: Payer: Self-pay | Admitting: Nurse Practitioner

## 2023-06-07 VITALS — BP 148/73 | HR 83 | Temp 98.7°F | Ht 65.0 in | Wt 259.5 lb

## 2023-06-07 DIAGNOSIS — Z952 Presence of prosthetic heart valve: Secondary | ICD-10-CM | POA: Diagnosis not present

## 2023-06-07 DIAGNOSIS — R234 Changes in skin texture: Secondary | ICD-10-CM

## 2023-06-07 DIAGNOSIS — Z7901 Long term (current) use of anticoagulants: Secondary | ICD-10-CM | POA: Diagnosis not present

## 2023-06-07 LAB — COAGUCHEK XS/INR WAIVED
INR: 1.7 — ABNORMAL HIGH (ref 0.9–1.1)
Prothrombin Time: 20.1 s

## 2023-06-07 MED ORDER — UREA 10 HYDRATING 10 % EX CREA: TOPICAL_CREAM | CUTANEOUS | 0 refills | Status: DC | PRN

## 2023-06-07 NOTE — Assessment & Plan Note (Signed)
Ongoing to both heels.  Sent in urea cream to initiate to both heels and placed referral to podiatry for further assessment and recommendations.

## 2023-06-07 NOTE — Progress Notes (Signed)
   BP (!) 148/73   Pulse 83   Temp 98.7 F (37.1 C) (Oral)   Ht 5\' 5"  (1.651 m)   Wt 259 lb 8 oz (117.7 kg)   SpO2 94%   BMI 43.18 kg/m    Subjective:    Patient ID: Rodney Chary [name], male    DOB: 1960/12/19, 63 y.o.   MRN: [id number]  CC: Coumadin management  HPI: This patient is a 63 y.o. male who presents for coumadin management. The expected duration of coumadin treatment is lifelong The reason for anticoagulation is  mechanical heart valve. Follows with cardiology, Dr. Darrold Junker, last on 03/17/2023, no changes. Echo 03/13/2020 with LVEF greater then 55%, stable valve.   Wants to have feet looked at today, reports that feet are cracking with his dry skin.  Is diabetic, at baseline does have dry skin to both feet.  Present Coumadin dose: 8 MG three days a week and 7.5 MG four days a week Goal: 2.5-3.5 The patient does not have an active anticoagulation episode. Excessive bruising: no Nose bleeding: yes Rectal bleeding: no Prolonged menstrual cycles: N/A Eating diet with consistent amounts of foods containing Vitamin K:no Any recent antibiotic use? no  Relevant past medical, surgical, family and social history reviewed and updated as indicated. Interim medical history since our last visit reviewed. Allergies and medications reviewed and updated.  ROS: Per HPI unless specifically indicated above     Objective:    BP (!) 148/73   Pulse 83   Temp 98.7 F (37.1 C) (Oral)   Ht 5\' 5"  (1.651 m)   Wt 259 lb 8 oz (117.7 kg)   SpO2 94%   BMI 43.18 kg/m   Wt Readings from Last 3 Encounters:  06/07/23 259 lb 8 oz (117.7 kg)  05/22/23 254 lb 12.8 oz (115.6 kg)  04/28/23 260 lb (117.9 kg)     General: Well appearing, well nourished in no distress.  Normal mood and affect. Skin: No excessive bruising or rash. Feet: Sensation intact both feet, thick/yellow toenails, dry cracked skin to both heels with fissures present.  Pulse DP and PT bilateral 1+.  Last INR: 1.9  Last  CBC:  Lab Results  Component Value Date   WBC 8.8 04/28/2023   HGB 14.7 04/28/2023   HCT 44.3 04/28/2023   MCV 87 04/28/2023   PLT 223 04/28/2023    Results for orders placed or performed in visit on 05/22/23  CoaguChek XS/INR Waived   Collection Time: 05/22/23  3:34 PM  Result Value Ref Range   INR 1.9 (H) 0.9 - 1.1   Prothrombin Time 22.4 sec       Assessment:     ICD-10-CM   1. History of mechanical aortic valve replacement  Z95.2 CoaguChek XS/INR Waived    2. Current use of long term anticoagulation  Z79.01 CoaguChek XS/INR Waived    3. Cracked skin on feet  R23.4 Ambulatory referral to Podiatry      Plan:   Discussed current plan face-to-face with patient. For coumadin dosing, elected to increase to 8 MG four days a week and 7.5 MG three days a week. Will plan to recheck INR in 1 week.

## 2023-06-07 NOTE — Assessment & Plan Note (Signed)
Chronic, ongoing with Coumadin use. Was not taking Coumadin as instructed last visit. INR today 1.7 which is a trend down, not at 2.5 to 3.5 goal. Increase to 8 MG four days a week and 7.5 MG three days a week, put this on his wrap up sheet.  Not to take Ibuprofen due to risk for bleeding with this and only use Tylenol or OTC Voltaren gel or Icy/Hot patches.  Remain off Fenofibrate. Repeat INR in one week.

## 2023-06-07 NOTE — Assessment & Plan Note (Signed)
Refer to mechanical valve plan of care.

## 2023-06-08 ENCOUNTER — Other Ambulatory Visit: Payer: Self-pay

## 2023-06-08 ENCOUNTER — Other Ambulatory Visit: Payer: Self-pay | Admitting: Nurse Practitioner

## 2023-06-08 ENCOUNTER — Telehealth: Payer: Self-pay

## 2023-06-08 MED ORDER — BASAGLAR KWIKPEN 100 UNIT/ML ~~LOC~~ SOPN: 60.0000 [IU] | PEN_INJECTOR | Freq: Every day | SUBCUTANEOUS | 1 refills | Status: DC

## 2023-06-08 NOTE — Progress Notes (Signed)
   06/08/2023  Patient ID: [name], male   DOB: [birth year], 63 y.o.   MRN: [id number]  Returning patient's call from earlier.  Mr. Carsey [facility], and [facility] did not have [facility] ready for [facility], [facility], HCT[facility], or pen needles to inject insulin.  It does not appear that a prescription for [name] was ever sent to the pharmacy; it was listed on patient's profile as a historical med only.  Coordinated with patient's PCP, and [facility]  [facility] [place] [facility] not [facility] Fiasp in stock, so [facility] [place], at patient's request per pharmacy.    [facility] [place] [facility] not [facility] in [facility], but the pharmacy in [place] does; so I requested they have that location fill this as well.    Patient does have refills on file for pen needles, so these will be filled at [facility] location, too.  I requested that the pharmacy also send the prescription for Basaglar to the [facility] location; so the patient only has to make 1 pharmacy stop.  Will call [facility] [place] tomorrow to verify prescriptions are ready and check co-pays, then will inform patient.  Lenna Gilford, PharmD, DPLA

## 2023-06-09 ENCOUNTER — Telehealth: Payer: Self-pay

## 2023-06-09 NOTE — Progress Notes (Signed)
   06/09/2023  Patient ID: Rodney Chary [name], male   DOB: [birth year], 63 y.o.   MRN: [id number]  [facility] [place] [facility] make sure [facility] is working on patient's prescriptions for [employer], pen needles and hydrochlorothiazide.  Basaglar had to be ordered, but medication should be arriving to day; and patient's co-pay will be $13.48.  [name] is filled and ready, and co-pay on insurance and manufacture co-pay card is $25.50.  This location had hydrochlorothiazide 25 mg in stock, so this prescription is also ready for patient to pick up.  Pen needle prescription is also ready, but co-pay on and insurance is $52; pharmacy staff will show patient over-the-counter option that is $9.99.  Patient is aware of all above information and plans to pick prescriptions up later today once [name] has arrived.  Telephone follow-up is already scheduled with Mr. Lazalde for 2/24.  Lenna Gilford, PharmD, DPLA

## 2023-06-09 NOTE — Telephone Encounter (Signed)
Requested medication (s) are due for refill today - no  Requested medication (s) are on the active medication list -no  Future visit scheduled -yes  Last refill: unsure  Notes to clinic: non delegated Rx  Requested Prescriptions  Pending Prescriptions Disp Refills   meclizine (ANTIVERT) 12.5 MG tablet [Pharmacy Med Name: MECLIZINE 12.5 MG TABLET] 30 tablet 5    Sig: TAKE 1 TABLET BY MOUTH 3 TIMES DAILY AS NEEDED FOR DIZZINESS.     Not Delegated - Gastroenterology: Antiemetics Failed - 06/09/2023  8:52 AM      Failed - This refill cannot be delegated      Passed - Valid encounter within last 6 months    Recent Outpatient Visits           2 days ago History of mechanical aortic valve replacement   [facility] [employer], Rodney Dandy T, NP   2 weeks ago History of mechanical aortic valve replacement   [place] [facility] [place], Rodney T, NP   1 month ago Type 2 diabetes mellitus with proteinuria (HCC)   [place] [facility] [place], Rodney Dandy T, NP   2 months ago Current use of long term anticoagulation   [place] [facility] [place], Rodney [place] T, NP   2 months ago History of mechanical aortic valve replacement   [place] [facility] [place], Rodney Rank, NP       Future Appointments             In 5 days Rodney Skiff, NP [place] [facility], [facility]               Requested Prescriptions  Pending Prescriptions Disp Refills   meclizine (ANTIVERT) 12.5 MG tablet [Pharmacy Med Name: MECLIZINE 12.5 MG TABLET] 30 tablet 5    Sig: TAKE 1 TABLET BY MOUTH 3 TIMES DAILY AS NEEDED FOR DIZZINESS.     Not Delegated - Gastroenterology: Antiemetics Failed - 06/09/2023  8:52 AM      Failed - This refill cannot be delegated      Passed - Valid encounter within last 6 months    Recent Outpatient Visits           2 days ago History of mechanical aortic valve replacement   [place]  [facility] [place], Rodney Dandy T, NP   2 weeks ago History of mechanical aortic valve replacement   [place] [facility] [place], Kearny T, NP   1 month ago Type 2 diabetes mellitus with proteinuria (HCC)   [facility] [place], Rodney Dandy T, NP   2 months ago Current use of long term anticoagulation   [place] [facility] [place], Rodney Dandy T, NP   2 months ago History of mechanical aortic valve replacement   [facility] [place], Rodney Rank, NP       Future Appointments             In 5 days [place], Rodney Rank, NP [facility]

## 2023-06-11 NOTE — Patient Instructions (Addendum)
 Start 8 MG Coumadin  daily and return in one week.  Heart-Healthy Eating Plan Many factors influence your heart health, including eating and exercise habits. Heart health is also called coronary health. Coronary risk increases with abnormal blood fat (lipid) levels. A heart-healthy eating plan includes limiting unhealthy fats, increasing healthy fats, limiting salt (sodium) intake, and making other diet and lifestyle changes. What is my plan? Your health care provider may recommend that: You limit your fat intake to _________% or less of your total calories each day. You limit your saturated fat intake to _________% or less of your total calories each day. You limit the amount of cholesterol in your diet to less than _________ mg per day. You limit the amount of sodium in your diet to less than _________ mg per day. What are tips for following this plan? Cooking Cook foods using methods other than frying. Baking, boiling, grilling, and broiling are all good options. Other ways to reduce fat include: Removing the skin from poultry. Removing all visible fats from meats. Steaming vegetables in water or broth. Meal planning  At meals, imagine dividing your plate into fourths: Fill one-half of your plate with vegetables and green salads. Fill one-fourth of your plate with whole grains. Fill one-fourth of your plate with lean protein foods. Eat 2-4 cups of vegetables per day. One cup of vegetables equals 1 cup (91 g) broccoli or cauliflower florets, 2 medium carrots, 1 large bell pepper, 1 large sweet potato, 1 large tomato, 1 medium white potato, 2 cups (150 g) raw leafy greens. Eat 1-2 cups of fruit per day. One cup of fruit equals 1 small apple, 1 large banana, 1 cup (237 g) mixed fruit, 1 large orange,  cup (82 g) dried fruit, 1 cup (240 mL) 100% fruit juice. Eat more foods that contain soluble fiber. Examples include apples, broccoli, carrots, beans, peas, and barley. Aim to get 25-30 g of  fiber per day. Increase your consumption of legumes, nuts, and seeds to 4-5 servings per week. One serving of dried beans or legumes equals  cup (90 g) cooked, 1 serving of nuts is  oz (12 almonds, 24 pistachios, or 7 walnut halves), and 1 serving of seeds equals  oz (8 g). Fats Choose healthy fats more often. Choose monounsaturated and polyunsaturated fats, such as olive and canola oils, avocado oil, flaxseeds, walnuts, almonds, and seeds. Eat more omega-3 fats. Choose salmon, mackerel, sardines, tuna, flaxseed oil, and ground flaxseeds. Aim to eat fish at least 2 times each week. Check food labels carefully to identify foods with trans fats or high amounts of saturated fat. Limit saturated fats. These are found in animal products, such as meats, butter, and cream. Plant sources of saturated fats include palm oil, palm kernel oil, and coconut oil. Avoid foods with partially hydrogenated oils in them. These contain trans fats. Examples are stick margarine, some tub margarines, cookies, crackers, and other baked goods. Avoid fried foods. General information Eat more home-cooked food and less restaurant, buffet, and fast food. Limit or avoid alcohol. Limit foods that are high in added sugar and simple starches such as foods made using white refined flour (white breads, pastries, sweets). Lose weight if you are overweight. Losing just 5-10% of your body weight can help your overall health and prevent diseases such as diabetes and heart disease. Monitor your sodium intake, especially if you have high blood pressure. Talk with your health care provider about your sodium intake. Try to incorporate more vegetarian meals weekly.  What foods should I eat? Fruits All fresh, canned (in natural juice), or frozen fruits. Vegetables Fresh or frozen vegetables (raw, steamed, roasted, or grilled). Green salads. Grains Most grains. Choose whole wheat and whole grains most of the time. Rice and pasta,  including brown rice and pastas made with whole wheat. Meats and other proteins Lean, well-trimmed beef, veal, pork, and lamb. Chicken and turkey without skin. All fish and shellfish. Wild duck, rabbit, pheasant, and venison. Egg whites or low-cholesterol egg substitutes. Dried beans, peas, lentils, and tofu. Seeds and most nuts. Dairy Low-fat or nonfat cheeses, including ricotta and mozzarella. Skim or 1% milk (liquid, powdered, or evaporated). Buttermilk made with low-fat milk. Nonfat or low-fat yogurt. Fats and oils Non-hydrogenated (trans-free) margarines. Vegetable oils, including soybean, sesame, sunflower, olive, avocado, peanut, safflower, corn, canola, and cottonseed. Salad dressings or mayonnaise made with a vegetable oil. Beverages Water (mineral or sparkling). Coffee and tea. Unsweetened ice tea. Diet beverages. Sweets and desserts Sherbet, gelatin, and fruit ice. Small amounts of dark chocolate. Limit all sweets and desserts. Seasonings and condiments All seasonings and condiments. The items listed above may not be a complete list of foods and beverages you can eat. Contact a dietitian for more options. What foods should I avoid? Fruits Canned fruit in heavy syrup. Fruit in cream or butter sauce. Fried fruit. Limit coconut. Vegetables Vegetables cooked in cheese, cream, or butter sauce. Fried vegetables. Grains Breads made with saturated or trans fats, oils, or whole milk. Croissants. Sweet rolls. Donuts. High-fat crackers, such as cheese crackers and chips. Meats and other proteins Fatty meats, such as hot dogs, ribs, sausage, bacon, rib-eye roast or steak. High-fat deli meats, such as salami and bologna. Caviar. Domestic duck and goose. Organ meats, such as liver. Dairy Cream, sour cream, cream cheese, and creamed cottage cheese. Whole-milk cheeses. Whole or 2% milk (liquid, evaporated, or condensed). Whole buttermilk. Cream sauce or high-fat cheese sauce. Whole-milk  yogurt. Fats and oils Meat fat, or shortening. Cocoa butter, hydrogenated oils, palm oil, coconut oil, palm kernel oil. Solid fats and shortenings, including bacon fat, salt pork, lard, and butter. Nondairy cream substitutes. Salad dressings with cheese or sour cream. Beverages Regular sodas and any drinks with added sugar. Sweets and desserts Frosting. Pudding. Cookies. Cakes. Pies. Milk chocolate or white chocolate. Buttered syrups. Full-fat ice cream or ice cream drinks. The items listed above may not be a complete list of foods and beverages to avoid. Contact a dietitian for more information. Summary Heart-healthy meal planning includes limiting unhealthy fats, increasing healthy fats, limiting salt (sodium) intake and making other diet and lifestyle changes. Lose weight if you are overweight. Losing just 5-10% of your body weight can help your overall health and prevent diseases such as diabetes and heart disease. Focus on eating a balance of foods, including fruits and vegetables, low-fat or nonfat dairy, lean protein, nuts and legumes, whole grains, and heart-healthy oils and fats. This information is not intended to replace advice given to you by your health care provider. Make sure you discuss any questions you have with your health care provider. Document Revised: 05/31/2021 Document Reviewed: 05/31/2021 Elsevier Patient Education  2024 [employer].

## 2023-06-12 ENCOUNTER — Telehealth: Payer: Self-pay | Admitting: Nurse Practitioner

## 2023-06-12 ENCOUNTER — Telehealth: Payer: Self-pay

## 2023-06-12 NOTE — Progress Notes (Signed)
   06/12/2023  Patient ID: Rodney Chary [name], male   DOB: 1960/10/28, 63 y.o.   MRN: [id number]  Returning missed call/voicemail from patient.  [facility] [place] [facility] Basaglar [facility], but this did not come in.  I contacted the pharmacy, and medication was added to order too late to arrive today; so this will arrive tomorrow.  There are no issues with back order per the pharmacy.  Contacted Mr. Cada to make him aware of this, and he states he will get a text message tomorrow when that is there for pickup.  Patient did find a pen of Novolin N in his fridge he did not know he had.  He is going to use 30 units of this this evening, and he has been using the Fiasp 5 units 3 times daily with meals.  Fasting blood glucose this morning was 259.  We already have a follow-up scheduled the end of this month.  Lenna Gilford, PharmD, DPLA

## 2023-06-12 NOTE — Telephone Encounter (Signed)
Pt called in says the new med, he is supposed to be taking for insulin, long acting insulin ,24hr . He doesn't know the name of it. He states the pharmacy doesn't have this at all. Please cb

## 2023-06-13 ENCOUNTER — Telehealth: Payer: Self-pay

## 2023-06-13 NOTE — Progress Notes (Signed)
   06/13/2023  Patient ID: Rodney ONEIDA Kobus [name], male   DOB: Oct 21, 1960, 63 y.o.   MRN: [id number]  Returning missed call/voicemail from patient, and [facility]; and his co-pay was going to be over $300.  [facility], and they were not billing this to a co-pay card; provided information, and now co-pay is going to be $35.  Pharmacy originally stated the medication was going through for around $13; unsure where variation is coming from, but patient and endorses affordability of $35 co-pay.  He plans to pick medication up and start tomorrow.  Channing DELENA Mealing, PharmD, DPLA

## 2023-06-13 NOTE — Telephone Encounter (Signed)
See phone encounter from [place] regarding this medication.

## 2023-06-14 ENCOUNTER — Encounter: Payer: Self-pay | Admitting: Nurse Practitioner

## 2023-06-14 ENCOUNTER — Ambulatory Visit (INDEPENDENT_AMBULATORY_CARE_PROVIDER_SITE_OTHER): Payer: 59 | Admitting: Nurse Practitioner

## 2023-06-14 ENCOUNTER — Other Ambulatory Visit: Payer: Self-pay | Admitting: Nurse Practitioner

## 2023-06-14 VITALS — BP 136/78 | HR 73 | Temp 97.4°F | Ht 65.0 in | Wt 255.4 lb

## 2023-06-14 DIAGNOSIS — Z952 Presence of prosthetic heart valve: Secondary | ICD-10-CM | POA: Diagnosis not present

## 2023-06-14 DIAGNOSIS — Z7901 Long term (current) use of anticoagulants: Secondary | ICD-10-CM

## 2023-06-14 LAB — COAGUCHEK XS/INR WAIVED
INR: 1.6 — ABNORMAL HIGH (ref 0.9–1.1)
Prothrombin Time: 19.7 s

## 2023-06-14 MED ORDER — WARFARIN SODIUM 4 MG PO TABS: 4.0000 mg | ORAL_TABLET | Freq: Every day | ORAL | 4 refills | Status: DC

## 2023-06-14 MED ORDER — BASAGLAR KWIKPEN 100 UNIT/ML ~~LOC~~ SOPN: 60.0000 [IU] | PEN_INJECTOR | Freq: Every day | SUBCUTANEOUS | 1 refills | Status: DC

## 2023-06-14 NOTE — Assessment & Plan Note (Signed)
 Refer to mechanical valve plan of care.

## 2023-06-14 NOTE — Progress Notes (Signed)
   BP 136/78 (BP Location: Left Arm, Patient Position: Sitting, Cuff Size: Large)   Pulse 73   Temp (!) 97.4 F (36.3 C) (Oral)   Ht 5' 5 (1.651 m)   Wt 255 lb 6.4 oz (115.8 kg)   SpO2 95%   BMI 42.50 kg/m    Subjective:    Patient ID: Rodney ONEIDA Kobus [name], male    DOB: 10-27-1960, 63 y.o.   MRN: [id number]  CC: Coumadin  management  HPI: This patient is a 63 y.o. male who presents for coumadin  management. The expected duration of coumadin  treatment is lifelong The reason for anticoagulation is  mechanical heart valve. Follows with cardiology, Dr. Ammon, last on 03/17/2023, no changes. Echo 03/13/2020 with LVEF greater then 55%, stable valve.   Present Coumadin  dose: 8 MG four days a week and 7.5 MG three days a week Goal: 2.5-3.5 The patient does not have an active anticoagulation episode. Excessive bruising: no Nose bleeding: yes Rectal bleeding: no Prolonged menstrual cycles: N/A Eating diet with consistent amounts of foods containing Vitamin K:no Any recent antibiotic use? no  Relevant past medical, surgical, family and social history reviewed and updated as indicated. Interim medical history since our last visit reviewed. Allergies and medications reviewed and updated.  ROS: Per HPI unless specifically indicated above     Objective:    BP 136/78 (BP Location: Left Arm, Patient Position: Sitting, Cuff Size: Large)   Pulse 73   Temp (!) 97.4 F (36.3 C) (Oral)   Ht 5' 5 (1.651 m)   Wt 255 lb 6.4 oz (115.8 kg)   SpO2 95%   BMI 42.50 kg/m   Wt Readings from Last 3 Encounters:  06/14/23 255 lb 6.4 oz (115.8 kg)  06/07/23 259 lb 8 oz (117.7 kg)  05/22/23 254 lb 12.8 oz (115.6 kg)     General: Well appearing, well nourished in no distress.  Normal mood and affect. Skin: No excessive bruising or rash.  Last INR: 1.7  Last CBC:  Lab Results  Component Value Date   WBC 8.8 04/28/2023   HGB 14.7 04/28/2023   HCT 44.3 04/28/2023   MCV 87 04/28/2023   PLT 223  04/28/2023    Results for orders placed or performed in visit on 06/07/23  CoaguChek XS/INR Waived   Collection Time: 06/07/23  3:19 PM  Result Value Ref Range   INR 1.7 (H) 0.9 - 1.1   Prothrombin Time 20.1 sec       Assessment:     ICD-10-CM   1. History of mechanical aortic valve replacement  Z95.2 CoaguChek XS/INR Waived    2. Current use of long term anticoagulation  Z79.01 CoaguChek XS/INR Waived      Plan:   Discussed current plan face-to-face with patient. For coumadin  dosing, elected to increase to 8 MG daily. Will plan to recheck INR in 1 week.

## 2023-06-14 NOTE — Assessment & Plan Note (Signed)
 Chronic, ongoing with Coumadin  use. INR 1.6 today, slight trend down. Increase to 8 MG Coumadin  every day, put this on his wrap up sheet.  Not to take Ibuprofen due to risk for bleeding with this and only use Tylenol  or OTC Voltaren gel or Icy/Hot patches.  Remain off Fenofibrate . Repeat INR in one week.

## 2023-06-15 NOTE — Telephone Encounter (Signed)
 Requested medications are due for refill today.  no  Requested medications are on the active medications list.  Urea  is  Last refill. Urea  06/07/2023, Meclizine  04/19/2022  Future visit scheduled.   yes  Notes to clinic.  Meclizine  was d/c'd  - refill /refusal not delegated. Urea  was recently refilled 06/07/2023 Please review.    Requested Prescriptions  Pending Prescriptions Disp Refills   meclizine  (ANTIVERT ) 12.5 MG tablet [Pharmacy Med Name: MECLIZINE  12.5 MG TABLET] 30 tablet 5    Sig: TAKE 1 TABLET BY MOUTH 3 TIMES DAILY AS NEEDED FOR DIZZINESS.     Not Delegated - Gastroenterology: Antiemetics Failed - 06/15/2023  4:35 PM      Failed - This refill cannot be delegated      Passed - Valid encounter within last 6 months    Recent Outpatient Visits           1 week ago History of mechanical aortic valve replacement   [place] [facility] [place], Melanie T, NP   3 weeks ago History of mechanical aortic valve replacement   [place] [facility] [place], [place] T, NP   1 month ago Type 2 diabetes mellitus with proteinuria (HCC)   [place] [facility] [place], Melanie T, NP   2 months ago Current use of long term anticoagulation   [place] [facility] [place], [place] T, NP   3 months ago History of mechanical aortic valve replacement   [place] [facility] [place], Melanie DASEN, NP       Future Appointments             In 6 days [place], Jolene T, NP [place] [facility], [facility]             UREA  10 HYDRATING 10 % cream [Pharmacy Med Name: UREA  10% CREAM] 85 g     Sig: Apply topically as needed (to both heels on feet).     Off-Protocol Failed - 06/15/2023  4:35 PM      Failed - Medication not assigned to a protocol, review manually.      Passed - Valid encounter within last 12 months    Recent Outpatient Visits           1 week ago History of mechanical aortic valve replacement    [place] [facility] [place], [place] T, NP   3 weeks ago History of mechanical aortic valve replacement   [place] [facility] [place], [place] T, NP   1 month ago Type 2 diabetes mellitus with proteinuria (HCC)   [place] [facility] [place], Melanie T, NP   2 months ago Current use of long term anticoagulation   [place] [facility] [place], Melanie T, NP   3 months ago History of mechanical aortic valve replacement   [place] [facility] Melanie DASEN, NP       Future Appointments             In 6 days [place], Jolene T, NP [place] [facility], [facility]           Dermatology:  Other Passed - 06/15/2023  4:35 PM      Passed - Valid encounter within last 12 months    Recent Outpatient Visits           1 week ago History of mechanical aortic valve replacement   [place] [facility] [place],  Melanie DASEN, NP   3 weeks ago History of mechanical aortic valve replacement   [place] [facility] [place], [place] T, NP   1 month ago Type 2 diabetes mellitus with proteinuria (HCC)   [place] [facility] [place], Melanie T, NP   2 months ago Current use of long term anticoagulation   [place] [facility] [place], Melanie T, NP   3 months ago History of mechanical aortic valve replacement   [place] [facility] [place], Melanie DASEN, NP       Future Appointments             In 6 days [place], Jolene T, NP [place] [facility], [facility]

## 2023-06-18 NOTE — Patient Instructions (Signed)
Vitamin K Foods and Warfarin Warfarin is a blood thinner (anticoagulant). Anticoagulant medicines help prevent blood clots from forming or getting bigger. Warfarin works by blocking the activity of vitamin K. Vitamin K promotes normal blood clotting. When you take warfarin, problems can occur from suddenly increasing or decreasing the amount of vitamin K that you eat from one day to the next. These problems can occur due to varying levels of warfarin in your blood. Problems may include blood clots or bleeding. What are tips for eating the right amount of vitamin K? Reading food labels Know which foods contain vitamin K. Read food labels. Use the lists below to understand serving sizes and the amount of vitamin K in one serving. If you take a multivitamin that contains vitamin K, be sure to take it every day. Meal planning To avoid problems when taking warfarin: Eat a balanced diet that includes: Fresh fruits and vegetables. Whole grains. Low-fat dairy products. Lean proteins, such as fish, eggs, and lean cuts of meat. Avoid major changes in your diet. If you are going to change your diet, talk with your health care provider before making changes. Keep your intake of vitamin K consistent from day to day. Avoid eating large amounts of vitamin K one day and small amounts of vitamin K the next day. Work with a dietitian to develop a meal plan that works best for you.  What foods are high in vitamin K? Foods that are high in vitamin K contain more than 100 mcg (micrograms) per serving. These include: Broccoli (cooked from fresh) -  cup (78 g) has 110 mcg. Brussels sprouts (cooked from fresh) -  cup (78 g) has 109 mcg. Greens, beet (cooked from fresh) -  cup (72 g) has 350 mcg. Greens, collard (cooked from fresh) -  cup (66 g) has 263 mcg. Greens, turnip (cooked from fresh) -  cup (72 g) has 265 mcg. Green onions or scallions -  cup (50 g) has 105 mcg. Kale (cooked from fresh) -  cup (68  g) has 536 mcg. Parsley (raw) - 10 sprigs (10 g) has 164 mcg. Spinach (cooked from fresh) -  cup (90 g) has 444 mcg. Swiss chard (cooked from fresh) -  cup (88 g) has 287 mcg. The items listed above may not be a complete list of foods high in Vitamin K. Actual amounts of Vitamin K may differ depending on processing. Contact a dietitian for more information. What foods have a moderate amount of vitamin K? Foods that have a moderate amount of vitamin K contain 25-100 mcg per serving. These include: Asparagus (cooked from fresh) - 4 spears (60 g) have 30 mcg. Black-eyed peas (dried) -  cup (85 g) has 32 mcg. Cabbage (cooked from fresh) -  cup (78 g) has 84 mcg. Cabbage (raw) -  cup (35 g) has 26 mcg. Kiwi fruit - 1 medium (69 g) has 27 mcg. Lettuce (raw) - 1 cup (36 g) has 45 mcg. Okra (cooked from fresh) -  cup (80 g) has 32 mcg. Prunes (dried) - 5 prunes (47 g) have 25 mcg. Tuna, light, canned in oil - 3 oz (85 g) has 37 mcg. Watercress (raw) - 1 cup (34 g) has 85 mcg. The items listed above may not be a complete list of foods with a moderate amount of Vitamin K. Actual amounts of Vitamin K may differ depending on processing. Contact a dietitian for more information. What foods are low in vitamin K? Foods low in  vitamin K contain less than 25 mcg per serving. These include: Artichoke - 1 medium (128 g) has 18 mcg. Avocado - 1 oz (21 g) has 6 mcg. Blueberries -  cup (73 g) has 14 mcg. Carrots (cooked from fresh) -  cup (78 g) has 11 mcg. Cauliflower (raw) -  cup (54 g) has 8 mcg. Cucumber with peel (raw) -  cup (52 g) has 9 mcg. Grapes -  cup (76 g) has 12 mcg. Mango - 1 medium (207 g) has 9 mcg. Mixed nuts - 1 cup (142 g) has 17 mcg. Pear - 1 medium (178 g) has 8 mcg. Peas (cooked from fresh) -  cup (80 g) has 20 mcg. Pickled cucumber - 1 spear (65 g) has 11 mcg. Sauerkraut (canned) -  cup (118 g) has 16 mcg. Soybeans (cooked from fresh) -  cup (86 g) has 16 mcg. Tomato  (raw) - 1 medium (123 g) has 10 mcg. Tomato sauce (raw) -  cup (123 g) has 17 mcg. The items listed above may not be a complete list of foods low in Vitamin K. Actual amounts of Vitamin K may differ depending on processing. Contact a dietitian for more information. What foods do not have vitamin K? If a food contains less than 5 mcg per serving, it is considered to have no vitamin K. These foods include: Bread and cereal products. Cheese. Eggs. Fish and shellfish. Meat and poultry. Milk and dairy products. Seeds, such as sunflower or pumpkin seeds. The items listed above may not be a complete list of foods that do not have vitamin K. Actual amounts of vitamin K may differ depending on processing. Contact a dietitian for more information. Summary Warfarin is an anticoagulant that prevents blood clots from forming or getting bigger by blocking the activity of vitamin K. It is important to know the amount of vitamin K that is in the foods you eat and to keep your intake of vitamin K consistent from day to day. Avoid major changes in your diet. If you are going to change your diet, talk with your health care provider before making changes. This information is not intended to replace advice given to you by your health care provider. Make sure you discuss any questions you have with your health care provider. Document Revised: 07/01/2020 Document Reviewed: 07/01/2020 Elsevier Patient Education  2024 [employer].

## 2023-06-20 ENCOUNTER — Other Ambulatory Visit: Payer: Self-pay

## 2023-06-20 MED ORDER — FLUTICASONE PROPIONATE 50 MCG/ACT NA SUSP: 2.0000 | Freq: Every day | NASAL | 2 refills | Status: DC

## 2023-06-21 ENCOUNTER — Encounter: Payer: Self-pay | Admitting: Nurse Practitioner

## 2023-06-21 ENCOUNTER — Ambulatory Visit (INDEPENDENT_AMBULATORY_CARE_PROVIDER_SITE_OTHER): Payer: 59 | Admitting: Nurse Practitioner

## 2023-06-21 VITALS — BP 119/74 | HR 85 | Temp 99.8°F | Wt 255.0 lb

## 2023-06-21 DIAGNOSIS — Z952 Presence of prosthetic heart valve: Secondary | ICD-10-CM

## 2023-06-21 DIAGNOSIS — J101 Influenza due to other identified influenza virus with other respiratory manifestations: Secondary | ICD-10-CM | POA: Diagnosis not present

## 2023-06-21 DIAGNOSIS — Z7901 Long term (current) use of anticoagulants: Secondary | ICD-10-CM

## 2023-06-21 LAB — COAGUCHEK XS/INR WAIVED
INR: 1.3 — ABNORMAL HIGH (ref 0.9–1.1)
Prothrombin Time: 15.6 s

## 2023-06-21 MED ORDER — OSELTAMIVIR PHOSPHATE 75 MG PO CAPS: 75.0000 mg | ORAL_CAPSULE | Freq: Two times a day (BID) | ORAL | 0 refills | Status: AC

## 2023-06-21 NOTE — Assessment & Plan Note (Signed)
Refer to mechanical valve plan of care.

## 2023-06-21 NOTE — Assessment & Plan Note (Signed)
Chronic, ongoing with Coumadin use. INR 1.3 today, trend down, currently has Influenza A. Will maintain 8 MG Coumadin every day as starting Tamiflu which can enhance effect of Coumadin -- concern for increase at this time due to this.  Not to take Ibuprofen due to risk for bleeding with this and only use Tylenol or OTC Voltaren gel or Icy/Hot patches.  Remain off Fenofibrate. Repeat INR in one 5 days after Tamiflu complete.

## 2023-06-21 NOTE — Progress Notes (Signed)
   BP 119/74   Pulse 85   Temp 99.8 F (37.7 C) (Oral)   Wt 255 lb (115.7 kg)   SpO2 93%   BMI 42.43 kg/m    Subjective:    Patient ID: Rodney Chary [name], male    DOB: [birth year], 63 y.o.   MRN: [id number]  CC: Coumadin management  HPI: This patient is a 63 y.o. male who presents for coumadin management. The expected duration of coumadin treatment is lifelong The reason for anticoagulation is  mechanical heart valve. Follows with cardiology, Dr. Darrold Junker, last on 03/17/2023, no changes. Echo 03/13/2020 with LVEF greater then 55%, stable valve. He reports he is taking medication as instructed, but has been sick.  No new medications or changes in diet.  Concerned for flu as was exposed 2 days ago, granddaughters were sick with flu.  Denies SOB, CP.  Has cough, runny nose, congestion, fever, fatigue, nausea, cough, headache.  Present Coumadin dose: 8 MG daily Goal: 2.5-3.5 The patient does not have an active anticoagulation episode. Excessive bruising: no Nose bleeding: yes Rectal bleeding: no Prolonged menstrual cycles: N/A Eating diet with consistent amounts of foods containing Vitamin K:no Any recent antibiotic use? no  Relevant past medical, surgical, family and social history reviewed and updated as indicated. Interim medical history since our last visit reviewed. Allergies and medications reviewed and updated.  ROS: Per HPI unless specifically indicated above     Objective:    BP 119/74   Pulse 85   Temp 99.8 F (37.7 C) (Oral)   Wt 255 lb (115.7 kg)   SpO2 93%   BMI 42.43 kg/m   Wt Readings from Last 3 Encounters:  06/21/23 255 lb (115.7 kg)  06/14/23 255 lb 6.4 oz (115.8 kg)  06/07/23 259 lb 8 oz (117.7 kg)     General: Ill appearing, well nourished in no distress.  Normal mood and affect. Skin: No excessive bruising or rash. Pulmonary: Lung sounds clear throughout Cardiac: S1, S1 with regular rate.  Last INR: 1.6  Last CBC:  Lab Results  Component Value  Date   WBC 8.8 04/28/2023   HGB 14.7 04/28/2023   HCT 44.3 04/28/2023   MCV 87 04/28/2023   PLT 223 04/28/2023    Results for orders placed or performed in visit on 06/21/23  CoaguChek XS/INR Waived   Collection Time: 06/21/23  3:33 PM  Result Value Ref Range   INR 1.3 (H) 0.9 - 1.1   Prothrombin Time 15.6 sec       Assessment:     ICD-10-CM   1. History of mechanical aortic valve replacement  Z95.2 CoaguChek XS/INR Waived    2. Current use of long term anticoagulation  Z79.01     3. Influenza A  J10.1 Veritor Flu A/B Waived      Plan:   Discussed current plan face-to-face with patient. For coumadin dosing, elected to increase to 8 MG daily. Will plan to recheck INR in 1 week.

## 2023-06-21 NOTE — Assessment & Plan Note (Signed)
Acute, starting a little less than 48 hours ago.  + on testing today.  Start Tamiflu 75 MG BID for 5 days.  Tylenol as needed for fever.  May use OTC Coricidin for symptoms relief and Flonase nasal spray. Ensure plenty of rest and fluid intake.  If any worsening symptoms immediately go to ER.

## 2023-06-22 LAB — VERITOR FLU A/B WAIVED
Influenza A: POSITIVE — AB
Influenza B: NEGATIVE

## 2023-06-24 NOTE — Patient Instructions (Signed)

## 2023-06-26 ENCOUNTER — Encounter: Payer: Self-pay | Admitting: Nurse Practitioner

## 2023-06-26 ENCOUNTER — Ambulatory Visit (INDEPENDENT_AMBULATORY_CARE_PROVIDER_SITE_OTHER): Payer: 59 | Admitting: Nurse Practitioner

## 2023-06-26 VITALS — BP 128/78 | HR 77 | Temp 98.7°F | Ht 65.0 in | Wt 252.8 lb

## 2023-06-26 DIAGNOSIS — Z7901 Long term (current) use of anticoagulants: Secondary | ICD-10-CM

## 2023-06-26 DIAGNOSIS — Z952 Presence of prosthetic heart valve: Secondary | ICD-10-CM | POA: Diagnosis not present

## 2023-06-26 NOTE — Assessment & Plan Note (Signed)
 Refer to mechanical valve plan of care.

## 2023-06-26 NOTE — Progress Notes (Signed)
   BP 128/78 (BP Location: Left Arm, Patient Position: Sitting, Cuff Size: Large)   Pulse 77   Temp 98.7 F (37.1 C) (Oral)   Ht 5\' 5"  (1.651 m)   Wt 252 lb 12.8 oz (114.7 kg)   SpO2 93%   BMI 42.07 kg/m    Subjective:    Patient ID: Rodney Chary [name], male    DOB: May 18, 1960, 63 y.o.   MRN: [id number]  CC: Coumadin management  HPI: This patient is a 63 y.o. male who presents for coumadin management. The expected duration of coumadin treatment is lifelong The reason for anticoagulation is  mechanical heart valve. Follows with cardiology, Dr. Darrold Junker, last on 03/17/2023, no changes. Echo 03/13/2020 with LVEF greater then 55%, stable valve. Just completed Tamiflu and is feeling better.  Present Coumadin dose: 8 MG daily Goal: 2.5-3.5 The patient does not have an active anticoagulation episode. Excessive bruising: no Nose bleeding: no Rectal bleeding: no Prolonged menstrual cycles: N/A Eating diet with consistent amounts of foods containing Vitamin K:no Any recent antibiotic use? no  Relevant past medical, surgical, family and social history reviewed and updated as indicated. Interim medical history since our last visit reviewed. Allergies and medications reviewed and updated.  ROS: Per HPI unless specifically indicated above     Objective:    BP 128/78 (BP Location: Left Arm, Patient Position: Sitting, Cuff Size: Large)   Pulse 77   Temp 98.7 F (37.1 C) (Oral)   Ht 5\' 5"  (1.651 m)   Wt 252 lb 12.8 oz (114.7 kg)   SpO2 93%   BMI 42.07 kg/m   Wt Readings from Last 3 Encounters:  06/26/23 252 lb 12.8 oz (114.7 kg)  06/21/23 255 lb (115.7 kg)  06/14/23 255 lb 6.4 oz (115.8 kg)     General: Well appearing, well nourished in no distress.  Normal mood and affect. Skin: No excessive bruising or rash.  Last INR: 1.3  Last CBC:  Lab Results  Component Value Date   WBC 8.8 04/28/2023   HGB 14.7 04/28/2023   HCT 44.3 04/28/2023   MCV 87 04/28/2023   PLT 223 04/28/2023     Results for orders placed or performed in visit on 06/21/23  CoaguChek XS/INR Waived   Collection Time: 06/21/23  3:33 PM  Result Value Ref Range   INR 1.3 (H) 0.9 - 1.1   Prothrombin Time 15.6 sec  Veritor Flu A/B Waived   Collection Time: 06/21/23  4:07 PM  Result Value Ref Range   Influenza A Positive (A) Negative   Influenza B Negative Negative       Assessment:     ICD-10-CM   1. History of mechanical aortic valve replacement  Z95.2 CoaguChek XS/INR Waived    2. Current use of long term anticoagulation  Z79.01 CoaguChek XS/INR Waived      Plan:   Discussed current plan face-to-face with patient. For coumadin dosing, elected to increase to 8 MG daily. Will plan to recheck INR in March 2025.

## 2023-06-26 NOTE — Assessment & Plan Note (Signed)
Chronic, ongoing with Coumadin use. INR 2.7 today, improved and at goal.  Continue Coumadin 8 MG daily.  Not to take Ibuprofen due to risk for bleeding with this and only use Tylenol or OTC Voltaren gel or Icy/Hot patches.  Remain off Fenofibrate. Repeat INR in March 2025.

## 2023-06-27 LAB — COAGUCHEK XS/INR WAIVED
INR: 2.7 — ABNORMAL HIGH (ref 0.9–1.1)
Prothrombin Time: 32.9 s

## 2023-07-03 ENCOUNTER — Ambulatory Visit: Payer: 59 | Admitting: Podiatry

## 2023-07-03 ENCOUNTER — Encounter: Payer: Self-pay | Admitting: Podiatry

## 2023-07-03 ENCOUNTER — Other Ambulatory Visit: Payer: Self-pay

## 2023-07-03 DIAGNOSIS — E1142 Type 2 diabetes mellitus with diabetic polyneuropathy: Secondary | ICD-10-CM | POA: Diagnosis not present

## 2023-07-03 DIAGNOSIS — R234 Changes in skin texture: Secondary | ICD-10-CM | POA: Diagnosis not present

## 2023-07-03 NOTE — Progress Notes (Signed)
   07/03/2023  Patient ID: [name], male   DOB: 1961/01/07, 63 y.o.   MRN: [id number]  Subjective/Objective Telephone visit to follow-up on management of T2DM  Diabetes Management Plan -Current medications:  Basaglar 60 units daily, Fiasp 12 units TID with meals, Jardiance 25mg  daily -Patient is monitoring home BG regularly, and endorses FBG averaging 130 and 2hr post-prandial >200 but does not provide exact values -Does not endorse any s/sx of hypoglycemia  Assessment/Plan  Diabetes Management Plan -Increase Fiasp to 14 units TID with meals, continue Basaglar 60 units daily, continue Jardiance 25mg  daily -Continue to monitor FBG and 2h post-prandial BG -Patient sees PCP again 4/7 and will be due for A1c  Follow-up:  4/2  Lenna Gilford, PharmD, DPLA

## 2023-07-04 NOTE — Progress Notes (Signed)
 Subjective:  Patient ID: Rodney Chary [name], male    DOB: 1960/06/02,  MRN: [id number] HPI Chief Complaint  Patient presents with   Skin Problem    Heels bilateral - cracked, thick skin x years, sometimes bleeds, PCP gave cream and took some of the skin off that helped some, concerned about red dots dorsal forefoot bilateral as well, diabetic - last A1c was 11.5   New Patient (Initial Visit)    63 y.o. male presents with the above complaint.   ROS: Denies fever chills nausea vomit muscle aches pains calf pain back pain chest pain shortness of breath.  Past Medical History:  Diagnosis Date   Allergy    Diabetes mellitus without complication (HCC)    Hyperlipidemia    Hypertension    Past Surgical History:  Procedure Laterality Date   CARDIAC VALVE REPLACEMENT     COLONOSCOPY WITH PROPOFOL N/A 08/19/2019   Procedure: COLONOSCOPY WITH PROPOFOL;  Surgeon: [name], MD;  Location[facility] ENDOSCOPY;  Service: Gastroenterology;  Laterality: N/A;    Current Outpatient Medications:    Accu-Chek Softclix Lancets lancets, Use to check blood fasting blood glucose daily, Disp: 100 each, Rfl: 12   albuterol (VENTOLIN HFA) 108 (90 Base) MCG/ACT inhaler, TAKE 2 PUFFS BY MOUTH EVERY 6 HOURS AS NEEDED, Disp: 18 each, Rfl: 0   amLODipine (NORVASC) 5 MG tablet, Take 1 tablet (5 mg total) by mouth daily., Disp: 90 tablet, Rfl: 4   atorvastatin (LIPITOR) 80 MG tablet, Take 1 tablet (80 mg total) by mouth daily., Disp: 90 tablet, Rfl: 2   benazepril (LOTENSIN) 40 MG tablet, Take 1 tablet (40 mg total) by mouth daily., Disp: 90 tablet, Rfl: 4   Blood Glucose Monitoring Suppl (ACCU-CHEK AVIVA PLUS) w/Device KIT, Use to check fasting blood glucose daily, Disp: 1 kit, Rfl: 0   carvedilol (COREG) 25 MG tablet, TAKE 1 TABLET (25 MG TOTAL) BY MOUTH TWICE A DAY WITH MEALS, Disp: 180 tablet, Rfl: 3   gabapentin (NEURONTIN) 600 MG tablet, Take 600 MG (one tablet) by mouth in morning, then 600 MG (one tablet) by  mouth at lunch, and then 1200 MG (two tablets) by mouth at night before bedtime., Disp: 360 tablet, Rfl: 4   glucose blood (ACCU-CHEK AVIVA PLUS) test strip, Use to check blood fasting blood glucose daily, Disp: 100 each, Rfl: 12   hydrochlorothiazide (HYDRODIURIL) 25 MG tablet, Take 1 tablet (25 mg total) by mouth daily., Disp: 90 tablet, Rfl: 4   insulin aspart (FIASP FLEXTOUCH) 100 UNIT/ML FlexTouch Pen, Inject 5 Units into the skin with breakfast, with lunch, and with evening meal. Please apply manufacturer copay card:bin [id number], pcn cnrx, id [id number], grp [id number] (Patient taking differently: Inject 14 Units into the skin with breakfast, with lunch, and with evening meal. Please apply manufacturer copay card:bin [id number], pcn cnrx, id [id number], grp [id number]), Disp: 15 mL, Rfl: 1   Insulin Glargine (BASAGLAR KWIKPEN) 100 UNIT/ML, Inject 60 Units into the skin daily., Disp: 18 mL, Rfl: 1   Insulin Pen Needle 32G X 4 MM MISC, Use to inject insulin 4 times daily, Disp: 100 each, Rfl: 4   JARDIANCE 25 MG TABS tablet, Take 25 mg by mouth every morning., Disp: , Rfl:    mupirocin ointment (BACTROBAN) 2 %, Apply 1 Application topically 2 (two) times daily., Disp: 22 g, Rfl: 5   sildenafil (VIAGRA) 100 MG tablet, Take 0.5-1 tablets (50-100 mg total) by mouth daily as needed for erectile dysfunction., Disp: 3 tablet, Rfl:  0   urea (UREA 10 HYDRATING) 10 % cream, Apply topically as needed (to both heels on feet)., Disp: 71 g, Rfl: 0   warfarin (COUMADIN) 10 MG tablet, Take 10 mg by mouth daily., Disp: , Rfl:    warfarin (COUMADIN) 2.5 MG tablet, Take 1 tablet (2.5 mg total) by mouth daily., Disp: 90 tablet, Rfl: 4   warfarin (COUMADIN) 4 MG tablet, Take 1 tablet (4 mg total) by mouth daily., Disp: 90 tablet, Rfl: 4   warfarin (COUMADIN) 5 MG tablet, START TAKING 9 MG DAILY -- ONE 4 MG PILL AND ONE 5 MG PILL., Disp: 90 tablet, Rfl: 4  No Known Allergies Review of Systems Objective:  There were no  vitals filed for this visit.  General: Well developed, nourished, in no acute distress, alert and oriented x3   Dermatological: Skin is warm, dry and supple bilateral. Nails x 10 are well maintained; remaining integument appears unremarkable at this time. There are no open sores, no preulcerative lesions, no rash or signs of infection present.  Callus multiple skin fissures to the plantar posterior heels nonbleeding no signs of infection at this point.  Vascular: Dorsalis Pedis artery and Posterior Tibial artery pedal pulses are 2/4 bilateral with immedate capillary fill time. Pedal hair growth present. No varicosities and no lower extremity edema present bilateral.   Neruologic: Grossly intact via light touch bilateral. Vibratory intact via tuning fork bilateral. Protective threshold with Semmes Wienstein monofilament intact to all pedal sites bilateral. Patellar and Achilles deep tendon reflexes 2+ bilateral. No Babinski or clonus noted bilateral.   Musculoskeletal: No gross boney pedal deformities bilateral. No pain, crepitus, or limitation noted with foot and ankle range of motion bilateral. Muscular strength 5/5 in all groups tested bilateral.  Gait: Unassisted, Nonantalgic.    Radiographs:  None taken  Assessment & Plan:   Assessment: Diabetes mellitus i without complications.  Skin fissures bilateral heel.  Plan: Recommended O'Keefe's hand and foot cream under occlusion for 2 weeks at night and follow-up with me every 6 months for diabetic check.     [name], [place]

## 2023-07-10 ENCOUNTER — Telehealth: Payer: Self-pay

## 2023-07-10 NOTE — Progress Notes (Signed)
   07/10/2023  Patient ID: Rodney Chary [name], male   DOB: [birth year], 63 y.o.   MRN: [id number]  Returning missed call/voicemail from patient.  Patient is needing refill for Basaglar, and [facility] states copay will be >$600. [facility]ed [facility], and they state Basaglar is billing to copay card; but the medication needs to be ordered.  Jardiance coupon is not working, stating  fills exceed;so the copay for this medication is >$600.  Contacted copay card program, and patient's card is active (ID #[id number]).  Program states pharmacy needs to call [phone] for assistance processing.  [facility]ed [facility], and Basaglar came in the order and is going through on insurance and copay card for $35.  London Pepper was not being processed to the correct copay card- I provided processing information, but refill is $419- likely due to deductible that has not been met.  Contacted patient to make him aware, and he has already filled Jardiance in 2025 at a $10 copay.  I will call insurance tomorrow to see why cost has increased so much between last fill and now.    Lenna Gilford, PharmD, DPLA

## 2023-07-11 ENCOUNTER — Telehealth: Payer: Self-pay

## 2023-07-11 NOTE — Progress Notes (Signed)
   07/11/2023  Patient ID: Rodney Chary [name], male   DOB: 1960/08/12, 63 y.o.   MRN: [id number]  Contacted patient insurance plan to inquire about cost increase of Jardiance 25mg  since this was $10 last month when patient refilled the medication.  Insurance states patient needs to contact member services at [phone], because a premium payment may have been missed that is causing medications to go through at 100% medication cost for patient currently.  Contact patient to inform and provider member services number.   Lenna Gilford, PharmD, DPLA

## 2023-08-05 DIAGNOSIS — E119 Type 2 diabetes mellitus without complications: Secondary | ICD-10-CM | POA: Insufficient documentation

## 2023-08-08 ENCOUNTER — Other Ambulatory Visit: Payer: Self-pay

## 2023-08-09 NOTE — Progress Notes (Signed)
   08/09/2023  Patient ID: Rodney Chary [name], male   DOB: Jan 14, 1961, 63 y.o.   MRN: [id number]  Subjective/Objective Telephone visit to follow-up on management of diabetes   Diabetes Management Plan -Current medications:  Basaglar 60 units daily, Fiasp 14 units TID with meals -Patient is also prescribed Jardiance and was able to get for $25 previously with insurance and copay card.  However, copay recently increased to >$400; and patient has not filled since end of January.  Insurance stated premium was not up to date, and patient states he has not yet called them to address. -Patient endorses BG readings as high as 300 recently -Purchased diabetic cook book and plans to initiate dietary modifications to help with BG control -A1c 11.6% on 04/28/23  Assessment/Plan  Diabetes Management Plan -Uncontrolled -Patient sees PCP again 4/7 and is due for A1c -Unless insurance premium caught up, likely that insulin and generic medications will be the affordable treatment options at this time -If A1c >7%, consider increasing insulins 10-20%  Follow-up:  4 weeks  Lenna Gilford, PharmD, DPLA

## 2023-08-14 ENCOUNTER — Ambulatory Visit: Payer: 59 | Admitting: Nurse Practitioner

## 2023-08-14 DIAGNOSIS — I482 Chronic atrial fibrillation, unspecified: Secondary | ICD-10-CM

## 2023-08-14 DIAGNOSIS — Z7901 Long term (current) use of anticoagulants: Secondary | ICD-10-CM

## 2023-08-14 DIAGNOSIS — E1129 Type 2 diabetes mellitus with other diabetic kidney complication: Secondary | ICD-10-CM

## 2023-08-14 DIAGNOSIS — D6869 Other thrombophilia: Secondary | ICD-10-CM

## 2023-08-14 DIAGNOSIS — E119 Type 2 diabetes mellitus without complications: Secondary | ICD-10-CM

## 2023-08-14 DIAGNOSIS — Z952 Presence of prosthetic heart valve: Secondary | ICD-10-CM

## 2023-08-14 DIAGNOSIS — Z8673 Personal history of transient ischemic attack (TIA), and cerebral infarction without residual deficits: Secondary | ICD-10-CM

## 2023-08-14 DIAGNOSIS — E1169 Type 2 diabetes mellitus with other specified complication: Secondary | ICD-10-CM

## 2023-08-14 DIAGNOSIS — I152 Hypertension secondary to endocrine disorders: Secondary | ICD-10-CM

## 2023-08-14 DIAGNOSIS — E1142 Type 2 diabetes mellitus with diabetic polyneuropathy: Secondary | ICD-10-CM

## 2023-08-14 DIAGNOSIS — Z91148 Patient's other noncompliance with medication regimen for other reason: Secondary | ICD-10-CM

## 2023-08-15 ENCOUNTER — Other Ambulatory Visit: Payer: Self-pay

## 2023-08-15 NOTE — Progress Notes (Signed)
   08/15/2023  Patient ID: Rodney Chary [name], male   DOB: 1961-03-14, 63 y.o.   MRN: [id number]  Returning missed call/voicemail from patient.  [name] contacted his insurance plan and states plan has been reinstated, as he will pay his deductible at the end of this month.  [facility] request Jardiance refill be processed to insurance and manufacturer copay [employer] is coming back as not active coverage.  Since patient paying deductible at end of month, it is likely insurance will not be reinstated until 5/1.  Contacted patient, and he verified insurance told him plan would be reinstated now versus 5/1.  Will follow-up with insurance, because he has scheduled a follow-up with PCP later this month, too- will not be able to go if insurance not active.  Lenna Gilford, PharmD, DPLA

## 2023-08-17 ENCOUNTER — Telehealth: Payer: Self-pay

## 2023-08-17 NOTE — Progress Notes (Signed)
   08/17/2023  Patient ID: Rodney Chary [name], male   DOB: April 27, 1961, 64 y.o.   MRN: [id number]  Attempted eligibility check, and patient's account is still coming up as having no active prescription drug coverage.  [facility] Help Desk to verify information, and patient's plan is inactive.  Attempted to contact Rodney Dixon to inform him and verify, once again, he would not qualify for [facility] Managed Medicaid.  I was not able to reach him or leave a voicemail, so I will try to call again tomorrow.  Lenna Gilford, PharmD, DPLA

## 2023-08-18 ENCOUNTER — Telehealth: Payer: Self-pay

## 2023-08-18 NOTE — Progress Notes (Signed)
   08/18/2023  Patient ID: Rodney Chary [name], male   DOB: 26-Oct-1960, 63 y.o.   MRN: [id number]  Patient outreach to inform [name] that [address] insurance is still deactivated- he was under the impression it had been reinstated.  I also checked his eligibility for Medicaid just to verify nothing has changed, but HHI is over amount to qualify.  Patient will call insurance plan to follow-up Monday.  Lenna Gilford, PharmD, DPLA

## 2023-08-25 ENCOUNTER — Other Ambulatory Visit: Payer: Self-pay | Admitting: Nurse Practitioner

## 2023-08-25 ENCOUNTER — Ambulatory Visit: Payer: Self-pay | Admitting: Nurse Practitioner

## 2023-08-25 NOTE — Telephone Encounter (Signed)
 Requested Prescriptions  Pending Prescriptions Disp Refills   Insulin  Glargine (BASAGLAR  KWIKPEN) 100 UNIT/ML [Pharmacy Med Name: BASAGLAR  100 UNIT/ML KWIKPEN] 18 mL 1    Sig: INJECT 60 UNITS INTO THE SKIN DAILY     Endocrinology:  Diabetes - Insulins Failed - 08/25/2023 12:42 PM      Failed - HBA1C is between 0 and 7.9 and within 180 days    Hemoglobin A1C  Date Value Ref Range Status  12/16/2015 10.4  Final   HB A1C (BAYER DCA - WAIVED)  Date Value Ref Range Status  04/28/2023 11.6 (H) 4.8 - 5.6 % Final    Comment:             Prediabetes: 5.7 - 6.4          Diabetes: >6.4          Glycemic control for adults with diabetes: <7.0          Passed - Valid encounter within last 6 months    Recent Outpatient Visits           2 months ago History of mechanical aortic valve replacement   [place] [facility] [place], Sanjuan Crumbly T, NP   2 months ago History of mechanical aortic valve replacement   [place] [facility] [place], Epes T, NP   2 months ago History of mechanical aortic valve replacement   [place] [facility] [place], Lavelle Posey, NP

## 2023-09-04 ENCOUNTER — Other Ambulatory Visit: Payer: Self-pay

## 2023-09-04 ENCOUNTER — Telehealth: Payer: Self-pay

## 2023-09-04 ENCOUNTER — Observation Stay
Admission: EM | Admit: 2023-09-04 | Discharge: 2023-09-05 | Disposition: A | Discharge status: Home / Self Care | Attending: Internal Medicine | Admitting: Internal Medicine

## 2023-09-04 ENCOUNTER — Observation Stay

## 2023-09-04 ENCOUNTER — Emergency Department

## 2023-09-04 DIAGNOSIS — I10 Essential (primary) hypertension: Secondary | ICD-10-CM | POA: Diagnosis not present

## 2023-09-04 DIAGNOSIS — Z952 Presence of prosthetic heart valve: Secondary | ICD-10-CM

## 2023-09-04 DIAGNOSIS — Z7982 Long term (current) use of aspirin: Secondary | ICD-10-CM | POA: Diagnosis not present

## 2023-09-04 DIAGNOSIS — R4701 Aphasia: Secondary | ICD-10-CM | POA: Diagnosis present

## 2023-09-04 DIAGNOSIS — I152 Hypertension secondary to endocrine disorders: Secondary | ICD-10-CM | POA: Diagnosis present

## 2023-09-04 DIAGNOSIS — E66813 Obesity, class 3: Secondary | ICD-10-CM | POA: Diagnosis present

## 2023-09-04 DIAGNOSIS — E1165 Type 2 diabetes mellitus with hyperglycemia: Secondary | ICD-10-CM

## 2023-09-04 DIAGNOSIS — I480 Paroxysmal atrial fibrillation: Secondary | ICD-10-CM | POA: Insufficient documentation

## 2023-09-04 DIAGNOSIS — G459 Transient cerebral ischemic attack, unspecified: Secondary | ICD-10-CM | POA: Diagnosis not present

## 2023-09-04 DIAGNOSIS — Z79899 Other long term (current) drug therapy: Secondary | ICD-10-CM | POA: Insufficient documentation

## 2023-09-04 DIAGNOSIS — Z8673 Personal history of transient ischemic attack (TIA), and cerebral infarction without residual deficits: Secondary | ICD-10-CM | POA: Diagnosis not present

## 2023-09-04 DIAGNOSIS — R791 Abnormal coagulation profile: Secondary | ICD-10-CM | POA: Diagnosis not present

## 2023-09-04 DIAGNOSIS — Z6841 Body Mass Index (BMI) 40.0 and over, adult: Secondary | ICD-10-CM | POA: Diagnosis not present

## 2023-09-04 DIAGNOSIS — N400 Enlarged prostate without lower urinary tract symptoms: Secondary | ICD-10-CM | POA: Diagnosis not present

## 2023-09-04 DIAGNOSIS — Z7901 Long term (current) use of anticoagulants: Secondary | ICD-10-CM | POA: Diagnosis not present

## 2023-09-04 DIAGNOSIS — Z794 Long term (current) use of insulin: Secondary | ICD-10-CM | POA: Diagnosis not present

## 2023-09-04 DIAGNOSIS — E1159 Type 2 diabetes mellitus with other circulatory complications: Secondary | ICD-10-CM | POA: Diagnosis present

## 2023-09-04 LAB — CBC
HCT: 48.6 % (ref 39.0–52.0)
Hemoglobin: 16.3 g/dL (ref 13.0–17.0)
MCH: 28.4 pg (ref 26.0–34.0)
MCHC: 33.5 g/dL (ref 30.0–36.0)
MCV: 84.8 fL (ref 80.0–100.0)
Platelets: 197 10*3/uL (ref 150–400)
RBC: 5.73 MIL/uL (ref 4.22–5.81)
RDW: 12.3 % (ref 11.5–15.5)
WBC: 10.9 10*3/uL — ABNORMAL HIGH (ref 4.0–10.5)
nRBC: 0 % (ref 0.0–0.2)

## 2023-09-04 LAB — ETHANOL: Alcohol, Ethyl (B): <15 mg/dL (ref <15)

## 2023-09-04 LAB — COMPREHENSIVE METABOLIC PANEL WITH GFR
ALT: 35 U/L (ref 0–44)
AST: 28 U/L (ref 15–41)
Albumin: 4.1 g/dL (ref 3.5–5.0)
Alkaline Phosphatase: 79 U/L (ref 38–126)
Anion gap: 10 (ref 5–15)
BUN: 22 mg/dL (ref 8–23)
CO2: 26 mmol/L (ref 22–32)
Calcium: 9.2 mg/dL (ref 8.9–10.3)
Chloride: 97 mmol/L — ABNORMAL LOW (ref 98–111)
Creatinine, Ser: 1 mg/dL (ref 0.61–1.24)
GFR, Estimated: >60 mL/min (ref >60)
Glucose, Bld: 231 mg/dL — ABNORMAL HIGH (ref 70–99)
Potassium: 3.6 mmol/L (ref 3.5–5.1)
Sodium: 133 mmol/L — ABNORMAL LOW (ref 135–145)
Total Bilirubin: 0.6 mg/dL (ref 0.0–1.2)
Total Protein: 7.8 g/dL (ref 6.5–8.1)

## 2023-09-04 LAB — DIFFERENTIAL
Abs Immature Granulocytes: 0.05 10*3/uL (ref 0.00–0.07)
Basophils Absolute: 0.1 10*3/uL (ref 0.0–0.1)
Basophils Relative: 1 %
Eosinophils Absolute: 0.5 10*3/uL (ref 0.0–0.5)
Eosinophils Relative: 5 %
Immature Granulocytes: 1 %
Lymphocytes Relative: 21 %
Lymphs Abs: 2.2 10*3/uL (ref 0.7–4.0)
Monocytes Absolute: 0.7 10*3/uL (ref 0.1–1.0)
Monocytes Relative: 6 %
Neutro Abs: 7.3 10*3/uL (ref 1.7–7.7)
Neutrophils Relative %: 66 %

## 2023-09-04 LAB — APTT: aPTT: 31 s (ref 24–36)

## 2023-09-04 LAB — PROTIME-INR
INR: 1.4 — ABNORMAL HIGH (ref 0.8–1.2)
Prothrombin Time: 17 s — ABNORMAL HIGH (ref 11.4–15.2)

## 2023-09-04 LAB — CBG MONITORING, ED: Glucose-Capillary: 216 mg/dL — ABNORMAL HIGH (ref 70–99)

## 2023-09-04 MED ORDER — ACETAMINOPHEN 325 MG PO TABS: 650.0000 mg | ORAL_TABLET | ORAL | Status: DC | PRN

## 2023-09-04 MED ORDER — INSULIN ASPART 100 UNIT/ML IJ SOLN
0.0000 [IU] | Freq: Three times a day (TID) | INTRAMUSCULAR | Status: DC
  Administered 2023-09-05 (×2): 11 [IU] via SUBCUTANEOUS
  Filled 2023-09-04 (×2): qty 1

## 2023-09-04 MED ORDER — WARFARIN - PHARMACIST DOSING INPATIENT
Freq: Every day | Status: DC
  Filled 2023-09-04: qty 1

## 2023-09-04 MED ORDER — GADOBUTROL 1 MMOL/ML IV SOLN
10.0000 mL | Freq: Once | INTRAVENOUS | Status: AC | PRN
  Administered 2023-09-04: 10 mL via INTRAVENOUS

## 2023-09-04 MED ORDER — STROKE: EARLY STAGES OF RECOVERY BOOK: Freq: Once | Status: AC

## 2023-09-04 MED ORDER — ATORVASTATIN CALCIUM 20 MG PO TABS
80.0000 mg | ORAL_TABLET | Freq: Every day | ORAL | Status: DC
  Administered 2023-09-05: 80 mg via ORAL
  Filled 2023-09-04: qty 4

## 2023-09-04 MED ORDER — HEPARIN (PORCINE) 25000 UT/250ML-% IV SOLN
1850.0000 [IU]/h | INTRAVENOUS | Status: DC
  Administered 2023-09-04: 1400 [IU]/h via INTRAVENOUS
  Filled 2023-09-04: qty 250

## 2023-09-04 MED ORDER — ACETAMINOPHEN 650 MG RE SUPP: 650.0000 mg | RECTAL | Status: DC | PRN

## 2023-09-04 MED ORDER — ALBUTEROL SULFATE (2.5 MG/3ML) 0.083% IN NEBU: 2.5000 mg | INHALATION_SOLUTION | Freq: Four times a day (QID) | RESPIRATORY_TRACT | Status: DC | PRN

## 2023-09-04 MED ORDER — WARFARIN SODIUM 10 MG PO TABS
10.0000 mg | ORAL_TABLET | ORAL | Status: AC
  Administered 2023-09-04: 10 mg via ORAL
  Filled 2023-09-04: qty 1

## 2023-09-04 MED ORDER — INSULIN GLARGINE-YFGN 100 UNIT/ML ~~LOC~~ SOLN
60.0000 [IU] | Freq: Every day | SUBCUTANEOUS | Status: DC
  Administered 2023-09-05: 60 [IU] via SUBCUTANEOUS
  Filled 2023-09-04: qty 0.6

## 2023-09-04 MED ORDER — EMPAGLIFLOZIN 25 MG PO TABS
25.0000 mg | ORAL_TABLET | Freq: Every morning | ORAL | Status: DC
  Administered 2023-09-05: 25 mg via ORAL
  Filled 2023-09-04: qty 1

## 2023-09-04 MED ORDER — ASPIRIN 81 MG PO CHEW
324.0000 mg | CHEWABLE_TABLET | Freq: Once | ORAL | Status: AC
  Administered 2023-09-04: 324 mg via ORAL
  Filled 2023-09-04: qty 4

## 2023-09-04 MED ORDER — ACETAMINOPHEN 160 MG/5ML PO SOLN: 650.0000 mg | ORAL | Status: DC | PRN

## 2023-09-04 MED ORDER — INSULIN ASPART 100 UNIT/ML IJ SOLN
0.0000 [IU] | Freq: Every day | INTRAMUSCULAR | Status: DC
  Administered 2023-09-04: 2 [IU] via SUBCUTANEOUS
  Filled 2023-09-04: qty 1

## 2023-09-04 NOTE — Assessment & Plan Note (Addendum)
 BP elevated to 188/98 Hold antihypertensives to allow for permissive hypertension

## 2023-09-04 NOTE — Consult Note (Addendum)
 PHARMACY - ANTICOAGULATION CONSULT NOTE  Pharmacy Consult for warfarin and heparin infusion Indication:  mechanical valve and Afib  No Known Allergies  Patient Measurements: Height: 5\' 8"  (172.7 cm) Weight: 118.2 kg (260 lb 9.6 oz) IBW/kg (Calculated) : 68.4 HEPARIN DW (KG): 95.3  Vital Signs: Temp: 98.1 F (36.7 C) (04/28 1720) Temp Source: Oral (04/28 1720) BP: 188/98 (04/28 2030) Pulse Rate: 76 (04/28 2030)  Labs: Recent Labs    09/04/23 1726  HGB 16.3  HCT 48.6  PLT 197  APTT 31  LABPROT 17.0*  INR 1.4*  CREATININE 1.00    Estimated Creatinine Clearance: 94.4 mL/min (by C-G formula based on SCr of 1 mg/dL).   Medical History: Past Medical History:  Diagnosis Date   Allergy    Diabetes mellitus without complication (HCC)    Hyperlipidemia    Hypertension     PTA warfarin dose Warfarin 8mg  po daily - last INR (POC) 2.7 on 2/17 Patient reports last warfarin dose yesterday 4/27  Assessment: 63 yo male presents to ED with aphasia. Noted PMH includes bicuspid aortic valve with aortic valve mechanical replacement on Coumadin , paroxysmal atrial fibrillation, prior CVA, hypertension, and hyperlipidemia.   Pharmacy consulted to manage warfarin while inpatient and bridge with heparin infusion until INR at therapeutic range. INR 1.4 on admission and reported therapy compliance by patient. NO BOLUS per MD consult and cva symptoms on admission.  Goal of Therapy:  INR 2.5 - 3.5 Heparin level 0.3-0.5  Plan:  Warfarin 10mg  po today only Start heparin infusion at 1400 units/hr Check heparin level in 6 hours and daily while on heparin Continue to monitor H&H and platelets  Undine Nealis Rodriguez-Guzman PharmD, BCPS 09/04/2023 8:44 PM

## 2023-09-04 NOTE — Assessment & Plan Note (Signed)
 Blood glucose 231 Basal insulin  with sliding scale coverage Continue home Jardiance 

## 2023-09-04 NOTE — Assessment & Plan Note (Addendum)
 History of stroke on imaging Patient arrived within tPA window but symptoms completely resolved by arrival Admitted for stroke workup as follows Permissive hypertension for first 24-48 hrs post stroke onset: Prn Labetalol IV or Vasotec IV If BP greater than 220/120  Statins for LDL goal less than 70 ASA 81mg  daily, holding off on Plavix due to increased bleeding risk as patient will be on heparin drip until INR is therapeutic Telemetry, echo , MRI, MRA Avoid dextrose containing fluids, Maintain euglycemia, euthermia Neuro checks q4 hrs x 24 hrs and then per shift Head of bed 30 degrees Physical therapy/Occupational therapy/Speech therapy if failed dysphagia screen Neurology consult to follow

## 2023-09-04 NOTE — Assessment & Plan Note (Signed)
 Resume meds when verified

## 2023-09-04 NOTE — H&P (Incomplete)
 History and Physical    Patient: Rodney Dixon WUJ:[id number] DOB: [birth year] DOA: 09/04/2023 DOS: the patient was seen and examined on 09/04/2023 PCP: Lemar Pyles, NP  Patient coming from: Home  Chief Complaint:  Chief Complaint  Patient presents with   Aphasia    HPI: Rodney Dixon. is a 63 y.o. male with medical history significant for Insulin -dependent type 2 diabetes, HTN, OSA, morbid obesity, mechanical aortic valve since 2006 on Coumadin , paroxysmal A-fib and prior CVA on past imaging, being admitted for stroke workup.  Wife at bedside who gives a history states that they were talking and his words suddenly became garbled and incomprehensible.  She states he was speaking fluently but she could not make sense of what he was saying.  The episode lasted 15 to 20 minutes.  He endorsed a mild headache.  He denies visual disturbance, one-sided denies numbness or tingling.  The symptoms have since completely resolved.  He was previously in his usual state of health. ED course and data review: BP 177/98 with otherwise normal vitals. Labs notable for blood glucose of 231 and subtherapeutic INR of 1.4 Other labs including CBC, CMP, EtOH for the most part unremarkable.  EKG, Personally viewed and interpreted showing NSR at 77 with no concerning ST-T wave changes CT head showed old lacunar infarcts, stable punctate calcifications but no acute intracranial findings.  Patient treated with chewable aspirin Hospitalist consulted for admission.     Review of Systems: As mentioned in the history of present illness. All other systems reviewed and are negative.  Past Medical History:  Diagnosis Date   Allergy    Diabetes mellitus without complication (HCC)    Hyperlipidemia    Hypertension    Past Surgical History:  Procedure Laterality Date   CARDIAC VALVE REPLACEMENT     COLONOSCOPY WITH PROPOFOL  N/A 08/19/2019   Procedure: COLONOSCOPY WITH PROPOFOL ;  Surgeon: Luke Salaam,  MD;  Location: [facility] ENDOSCOPY;  Service: Gastroenterology;  Laterality: N/A;   Social History:  reports that he has never smoked. He has never used smokeless tobacco. He reports that he does not drink alcohol and does not use drugs.  No Known Allergies  Family History  Problem Relation Age of Onset   Diabetes Mother    Heart disease Mother    Hyperlipidemia Mother    Hypertension Mother     Prior to Admission medications   Medication Sig Start Date End Date Taking? Authorizing Provider  amLODipine  (NORVASC ) 5 MG tablet Take 1 tablet (5 mg total) by mouth daily. 05/22/23  Yes Cannady, Jolene T, NP  atorvastatin  (LIPITOR) 80 MG tablet Take 1 tablet (80 mg total) by mouth daily. 05/22/23  Yes Cannady, Jolene T, NP  carvedilol  (COREG ) 25 MG tablet TAKE 1 TABLET (25 MG TOTAL) BY MOUTH TWICE A DAY WITH MEALS 01/24/23  Yes Cannady, Jolene T, NP  gabapentin  (NEURONTIN ) 600 MG tablet Take 600 MG (one tablet) by mouth in morning, then 600 MG (one tablet) by mouth at lunch, and then 1200 MG (two tablets) by mouth at night before bedtime. Patient taking differently: Take 600 mg by mouth at bedtime. Take 600 MG at night 08/19/22  Yes Cannady, Jolene T, NP  hydrochlorothiazide  (HYDRODIURIL ) 25 MG tablet Take 1 tablet (25 mg total) by mouth daily. 02/27/23  Yes Cannady, Jolene T, NP  warfarin (COUMADIN ) 10 MG tablet Take 8 mg by mouth daily.   Yes [provider]  Accu-Chek Softclix Lancets lancets Use to check  blood fasting blood glucose daily 09/22/22   Cannady, Jolene T, NP  albuterol  (VENTOLIN  HFA) 108 (90 Base) MCG/ACT inhaler TAKE 2 PUFFS BY MOUTH EVERY 6 HOURS AS NEEDED 08/10/22   Cannady, Jolene T, NP  benazepril  (LOTENSIN ) 40 MG tablet Take 1 tablet (40 mg total) by mouth daily. 08/19/22   Cannady, Jolene T, NP  Blood Glucose Monitoring Suppl (ACCU-CHEK AVIVA PLUS) w/Device KIT Use to check fasting blood glucose daily 09/22/22   Cannady, Jolene T, NP  glucose blood (ACCU-CHEK AVIVA PLUS) test  strip Use to check blood fasting blood glucose daily 09/22/22   Cannady, Jolene T, NP  insulin  aspart (FIASP  FLEXTOUCH) 100 UNIT/ML FlexTouch Pen Inject 5 Units into the skin with breakfast, with lunch, and with evening meal. Please apply manufacturer copay card:bin [id number], pcn cnrx, id [id number], grp [id number] Patient taking differently: Inject 14 Units into the skin with breakfast, with lunch, and with evening meal. Please apply manufacturer copay card:bin [id number], pcn cnrx, id [id number], grp [id number] 05/15/23   Cannady, Jolene T, NP  Insulin  Glargine (BASAGLAR  KWIKPEN) 100 UNIT/ML INJECT 60 UNITS INTO THE SKIN DAILY 08/25/23   Cannady, Jolene T, NP  Insulin  Pen Needle 32G X 4 MM MISC Use to inject insulin  4 times daily 05/08/23   Cannady, Jolene T, NP  JARDIANCE  25 MG TABS tablet Take 25 mg by mouth every morning. Patient not taking: Reported on 08/08/2023 05/08/23   [provider]  mupirocin  ointment (BACTROBAN ) 2 % Apply 1 Application topically 2 (two) times daily. 11/18/22   Cannady, Jolene T, NP  sildenafil  (VIAGRA ) 100 MG tablet Take 0.5-1 tablets (50-100 mg total) by mouth daily as needed for erectile dysfunction. 11/18/22   Cannady, Jolene T, NP  urea  (UREA  10 HYDRATING) 10 % cream Apply topically as needed (to both heels on feet). 06/07/23   Cannady, Jolene T, NP  warfarin (COUMADIN ) 2.5 MG tablet Take 1 tablet (2.5 mg total) by mouth daily. 05/22/23   Cannady, Jolene T, NP  warfarin (COUMADIN ) 4 MG tablet Take 1 tablet (4 mg total) by mouth daily. 06/14/23   Cannady, Jolene T, NP  warfarin (COUMADIN ) 5 MG tablet START TAKING 9 MG DAILY -- ONE 4 MG PILL AND ONE 5 MG PILL. 02/27/23   Lemar Pyles, NP    Physical Exam: Vitals:   09/04/23 1720 09/04/23 1945 09/04/23 2030 09/04/23 2039  BP: (!) 177/98 (!) 217/108 (!) 188/98   Pulse: 95 78 76   Resp: 20 18 10    Temp: 98.1 F (36.7 C)     TempSrc: Oral     SpO2: 98% 95% 95%   Weight:    118.2 kg  Height:    5\' 8"  (1.727 m)    Physical Exam Vitals and nursing note reviewed.  Constitutional:      General: He is not in acute distress. HENT:     Head: Normocephalic and atraumatic.  Cardiovascular:     Rate and Rhythm: Normal rate and regular rhythm.     Heart sounds: Normal heart sounds.  Pulmonary:     Effort: Pulmonary effort is normal.     Breath sounds: Normal breath sounds.  Abdominal:     Palpations: Abdomen is soft.     Tenderness: There is no abdominal tenderness.  Neurological:     General: No focal deficit present.     Mental Status: Mental status is at baseline.     Labs on Admission: I have personally reviewed following labs and imaging studies  CBC: Recent Labs  Lab 09/04/23 1726  WBC 10.9*  NEUTROABS 7.3  HGB 16.3  HCT 48.6  MCV 84.8  PLT 197   Basic Metabolic Panel: Recent Labs  Lab 09/04/23 1726  NA 133*  K 3.6  CL 97*  CO2 26  GLUCOSE 231*  BUN 22  CREATININE 1.00  CALCIUM  9.2   GFR: Estimated Creatinine Clearance: 94.4 mL/min (by C-G formula based on SCr of 1 mg/dL). Liver Function Tests: Recent Labs  Lab 09/04/23 1726  AST 28  ALT 35  ALKPHOS 79  BILITOT 0.6  PROT 7.8  ALBUMIN 4.1   No results for input(s): "LIPASE", "AMYLASE" in the last 168 hours. No results for input(s): "AMMONIA" in the last 168 hours. Coagulation Profile: Recent Labs  Lab 09/04/23 1726  INR 1.4*   Cardiac Enzymes: No results for input(s): "CKTOTAL", "CKMB", "CKMBINDEX", "TROPONINI" in the last 168 hours. BNP (last 3 results) No results for input(s): "PROBNP" in the last 8760 hours. HbA1C: No results for input(s): "HGBA1C" in the last 72 hours. CBG: No results for input(s): "GLUCAP" in the last 168 hours. Lipid Profile: No results for input(s): "CHOL", "HDL", "LDLCALC", "TRIG", "CHOLHDL", "LDLDIRECT" in the last 72 hours. Thyroid  Function Tests: No results for input(s): "TSH", "T4TOTAL", "FREET4", "T3FREE", "THYROIDAB" in the last 72 hours. Anemia Panel: No results for  input(s): "VITAMINB12", "FOLATE", "FERRITIN", "TIBC", "IRON", "RETICCTPCT" in the last 72 hours. Urine analysis:    Component Value Date/Time   APPEARANCEUR Clear 07/26/2017 0957   GLUCOSEU 3+ (A) 07/26/2017 0957   BILIRUBINUR Negative 07/26/2017 0957   PROTEINUR Negative 07/26/2017 0957   NITRITE Negative 07/26/2017 0957   LEUKOCYTESUR Negative 07/26/2017 0957    Radiological Exams on Admission: CT HEAD WO CONTRAST Result Date: 09/04/2023 CLINICAL DATA:  TIA.  Slurred speech EXAM: CT HEAD WITHOUT CONTRAST TECHNIQUE: Contiguous axial images were obtained from the base of the skull through the vertex without intravenous contrast. RADIATION DOSE REDUCTION: This exam was performed according to the departmental dose-optimization program which includes automated exposure control, adjustment of the mA and/or kV according to patient size and/or use of iterative reconstruction technique. COMPARISON:  CT 05/12/2021, 02/07/2020 FINDINGS: Brain: Again demonstrated lacunar infarctions in the head of the caudate nucleus on the LEFT. Scattered punctate calcifications in the cerebral cortex are not changed over several comparison exams. No CT evidence of acute infarction. No midline shift or mass effect. No extra-axial fluid collections. Vascular: No hyperdense vessel or unexpected calcification. Skull: Normal. Negative for fracture or focal lesion. Sinuses/Orbits: Paranasal sinuses and mastoid air cells are clear. Orbits are clear. Other: None. IMPRESSION: 1. No acute intracranial findings. 2. Old lacunar infarctions in the head of the caudate nucleus on the LEFT. 3. Scattered punctate calcifications in the cerebral cortex are not changed over several comparison exams. Electronically Signed   By: Deboraha Fallow M.D.   On: 09/04/2023 18:45   Data Reviewed for HPI: Relevant notes from primary care and specialist visits, past discharge summaries as available in EHR, including Care Everywhere. Prior diagnostic  testing as pertinent to current admission diagnoses Updated medications and problem lists for reconciliation ED course, including vitals, labs, imaging, treatment and response to treatment Triage notes, nursing and pharmacy notes and ED provider's notes Notable results as noted above in HPI   Assessment and Plan: * TIA (transient ischemic attack) History of stroke on imaging Patient arrived within tPA window but symptoms completely resolved by arrival Admitted for stroke workup as follows Permissive hypertension for first  24-48 hrs post stroke onset: Prn Labetalol IV or Vasotec IV If BP greater than 220/120  Statins for LDL goal less than 70 ASA 81mg  daily, holding off on Plavix due to increased bleeding risk as patient will be on heparin drip until INR is therapeutic Telemetry, echo , MRI, MRA Avoid dextrose containing fluids, Maintain euglycemia, euthermia Neuro checks q4 hrs x 24 hrs and then per shift Head of bed 30 degrees Physical therapy/Occupational therapy/Speech therapy if failed dysphagia screen Neurology consult to follow   Subtherapeutic international normalized ratio (INR) History of mechanical aortic valve placement in 2006 INR 1.4 Will start heparin drip without bolus to bridge to therapeutic INR Pharmacy consulted to manage both Coumadin  and heparin Monitor for any changes in neurologic status anticoagulation but at this time we believe benefit outweighs risk and mechanical heart  Uncontrolled type 2 diabetes mellitus with hyperglycemia, with long-term current use of insulin  (HCC) Blood glucose 231 Basal insulin  with sliding scale coverage Continue home Jardiance   Primary hypertension BP elevated to 188/98 Hold antihypertensives to allow for permissive hypertension  Benign prostatic hyperplasia without lower urinary tract symptoms Resume meds when verified  Obesity, Class III, BMI 40-49.9 (morbid obesity) (HCC) Complicating factor to overall prognosis and  care     DVT prophylaxis: heparin infusion  Consults: Neurology, Dr. Cleone Dad  Advance Care Planning: full code  Family Communication: Wife at bedside  Disposition Plan: Back to previous home environment  Severity of Illness: The appropriate patient status for this patient is OBSERVATION. Observation status is judged to be reasonable and necessary in order to provide the required intensity of service to ensure the patient's safety. The patient's presenting symptoms, physical exam findings, and initial radiographic and laboratory data in the context of their medical condition is felt to place them at decreased risk for further clinical deterioration. Furthermore, it is anticipated that the patient will be medically stable for discharge from the hospital within 2 midnights of admission.   Author: Lanetta Pion, MD 09/04/2023 8:43 PM  For on call review [email or web address].

## 2023-09-04 NOTE — Assessment & Plan Note (Signed)
 Complicating factor to overall prognosis and care

## 2023-09-04 NOTE — Progress Notes (Signed)
   09/04/2023  Patient ID: Rodney Coombs [name], male   DOB: Jul 19, 1960, 63 y.o.   MRN: [id number]  Outreach attempt to return missed call/voicemail from patient earlier today.  I was not able to reach the patient, and he does not have voicemail set up.  I will try to contact again tomorrow.  Linn Rich, PharmD, DPLA

## 2023-09-04 NOTE — ED Notes (Signed)
 ED TO INPATIENT HANDOFF REPORT  ED Nurse Name and Phone #: [phone]  S Name/Age/Gender Rodney Coombs [name] 63 y.o. male Room/Bed: ED30A/ED30A  Code Status   Code Status: Full Code  Home/SNF/Other Home Patient oriented to: self, place, time, and situation Is this baseline? Yes   Triage Complete: Triage complete  Chief Complaint TIA (transient ischemic attack) [G45.9]  Triage Note Pt to ED via POV from home. Pt reports around 3:50pm started to have slurred speech and HA. Slurred speech has resolved. Pt reports still has a 5/10 HA. No N/V/D, vision changes, CP, SOB or weakness/numbness. Pt speech clear on arrival. Pt A&Ox4. Pt is on blood thinner from 2 prior CVAs  EDP consulted    Allergies No Known Allergies  Level of Care/Admitting Diagnosis ED Disposition     ED Disposition  Admit   Condition  --   Comment  Hospital Area: [facility] [[id number]]  Level of Care: Telemetry Medical [104]  Covid Evaluation: Asymptomatic - no recent exposure (last 10 days) testing not required  Diagnosis: TIA (transient ischemic attack) [409811]  Admitting Physician: Lanetta Pion [[id number]]  Attending Physician: Lanetta Pion [[id number]]          B Medical/Surgery History Past Medical History:  Diagnosis Date   Allergy    Diabetes mellitus without complication (HCC)    Hyperlipidemia    Hypertension    Past Surgical History:  Procedure Laterality Date   CARDIAC VALVE REPLACEMENT     COLONOSCOPY WITH PROPOFOL  N/A 08/19/2019   Procedure: COLONOSCOPY WITH PROPOFOL ;  Surgeon: Luke Salaam, MD;  Location: [facility] ENDOSCOPY;  Service: Gastroenterology;  Laterality: N/A;     A IV Location/Drains/Wounds Patient Lines/Drains/Airways Status     Active Line/Drains/Airways     Name Placement date Placement time Site Days   Peripheral IV 09/04/23 20 G Left;Posterior Hand 09/04/23  2133  Hand  less than 1   Peripheral IV 09/04/23 20 G Anterior;Distal;Right;Upper Arm  09/04/23  2137  Arm  less than 1            Intake/Output Last 24 hours No intake or output data in the 24 hours ending 09/04/23 2230  Labs/Imaging Results for orders placed or performed during the hospital encounter of 09/04/23 (from the past 48 hours)  Protime-INR     Status: Abnormal   Collection Time: 09/04/23  5:26 PM  Result Value Ref Range   Prothrombin Time 17.0 (H) 11.4 - 15.2 seconds   INR 1.4 (H) 0.8 - 1.2    Comment: (NOTE) INR goal varies based on device and disease states. Performed at [facility], [address] [place], [place] [address]   APTT     Status: None   Collection Time: 09/04/23  5:26 PM  Result Value Ref Range   aPTT 31 24 - 36 seconds    Comment: Performed at [facility], [address] [place], [place] [address]  CBC     Status: Abnormal   Collection Time: 09/04/23  5:26 PM  Result Value Ref Range   WBC 10.9 (H) 4.0 - 10.5 K/uL   RBC 5.73 4.22 - 5.81 MIL/uL   Hemoglobin 16.3 13.0 - 17.0 g/dL   HCT 52.8 41.3 - 24.4 %   MCV 84.8 80.0 - 100.0 fL   MCH 28.4 26.0 - 34.0 pg   MCHC 33.5 30.0 - 36.0 g/dL   RDW 01.0 27.2 - 53.6 %   Platelets 197 150 - 400 K/uL  nRBC 0.0 0.0 - 0.2 %    Comment: Performed at [facility], [address] [place], [place] [address]  Differential     Status: None   Collection Time: 09/04/23  5:26 PM  Result Value Ref Range   Neutrophils Relative % 66 %   Neutro Abs 7.3 1.7 - 7.7 K/uL   Lymphocytes Relative 21 %   Lymphs Abs 2.2 0.7 - 4.0 K/uL   Monocytes Relative 6 %   Monocytes Absolute 0.7 0.1 - 1.0 K/uL   Eosinophils Relative 5 %   Eosinophils Absolute 0.5 0.0 - 0.5 K/uL   Basophils Relative 1 %   Basophils Absolute 0.1 0.0 - 0.1 K/uL   Immature Granulocytes 1 %   Abs Immature Granulocytes 0.05 0.00 - 0.07 K/uL    Comment: Performed at [facility], [address] [place], [place] [address]  Comprehensive metabolic panel     Status: Abnormal   Collection Time:  09/04/23  5:26 PM  Result Value Ref Range   Sodium 133 (L) 135 - 145 mmol/L   Potassium 3.6 3.5 - 5.1 mmol/L   Chloride 97 (L) 98 - 111 mmol/L   CO2 26 22 - 32 mmol/L   Glucose, Bld 231 (H) 70 - 99 mg/dL    Comment: Glucose reference range applies only to samples taken after fasting for at least 8 hours.   BUN 22 8 - 23 mg/dL   Creatinine, Ser 0.98 0.61 - 1.24 mg/dL   Calcium  9.2 8.9 - 10.3 mg/dL   Total Protein 7.8 6.5 - 8.1 g/dL   Albumin 4.1 3.5 - 5.0 g/dL   AST 28 15 - 41 U/L   ALT 35 0 - 44 U/L   Alkaline Phosphatase 79 38 - 126 U/L   Total Bilirubin 0.6 0.0 - 1.2 mg/dL   GFR, Estimated >11 >91 mL/min    Comment: (NOTE) Calculated using the CKD-EPI Creatinine Equation (2021)    Anion gap 10 5 - 15    Comment: Performed at [facility], [address] [place], [place] [address]  Ethanol     Status: None   Collection Time: 09/04/23  5:26 PM  Result Value Ref Range   Alcohol, Ethyl (B) <15 <15 mg/dL    Comment: Please note change in reference range. (NOTE) For medical purposes only. Performed at [facility], [address] [place], [place] [address]   CBG monitoring, ED     Status: Abnormal   Collection Time: 09/04/23  9:41 PM  Result Value Ref Range   Glucose-Capillary 216 (H) 70 - 99 mg/dL    Comment: Glucose reference range applies only to samples taken after fasting for at least 8 hours.   CT HEAD WO CONTRAST Result Date: 09/04/2023 CLINICAL DATA:  TIA.  Slurred speech EXAM: CT HEAD WITHOUT CONTRAST TECHNIQUE: Contiguous axial images were obtained from the base of the skull through the vertex without intravenous contrast. RADIATION DOSE REDUCTION: This exam was performed according to the departmental dose-optimization program which includes automated exposure control, adjustment of the mA and/or kV according to patient size and/or use of iterative reconstruction technique. COMPARISON:  CT 05/12/2021, 02/07/2020 FINDINGS: Brain: Again demonstrated  lacunar infarctions in the head of the caudate nucleus on the LEFT. Scattered punctate calcifications in the cerebral cortex are not changed over several comparison exams. No CT evidence of acute infarction. No midline shift or mass effect. No extra-axial fluid collections. Vascular: No hyperdense vessel or unexpected calcification. Skull: Normal.  Negative for fracture or focal lesion. Sinuses/Orbits: Paranasal sinuses and mastoid air cells are clear. Orbits are clear. Other: None. IMPRESSION: 1. No acute intracranial findings. 2. Old lacunar infarctions in the head of the caudate nucleus on the LEFT. 3. Scattered punctate calcifications in the cerebral cortex are not changed over several comparison exams. Electronically Signed   By: Deboraha Fallow M.D.   On: 09/04/2023 18:45    Pending Labs Unresulted Labs (From admission, onward)     Start     Ordered   09/05/23 0500  Protime-INR  Daily,   R      09/04/23 2043   09/05/23 0500  CBC  Tomorrow morning,   R        09/04/23 2046   09/05/23 0500  Lipid panel  (Labs)  Tomorrow morning,   R       Comments: Fasting    09/04/23 2057   09/05/23 0300  Heparin level (unfractionated)  Once-Timed,   TIMED        09/04/23 2046   09/04/23 2054  HIV Antibody (routine testing w rflx)  (HIV Antibody (Routine testing w reflex) panel)  Once,   R        09/04/23 2057   09/04/23 2054  Hemoglobin A1c  (Labs)  Once,   R       Comments: To assess prior glycemic control    09/04/23 2057            Vitals/Pain Today's Vitals   09/04/23 2030 09/04/23 2039 09/04/23 2139 09/04/23 2153  BP: (!) 188/98   (!) 164/110  Pulse: 76     Resp: 10     Temp:   97.8 F (36.6 C)   TempSrc:   Oral   SpO2: 95%     Weight:  118.2 kg    Height:  5\' 8"  (1.727 m)    PainSc:        Isolation Precautions No active isolations  Medications Medications  Warfarin - Pharmacist Dosing Inpatient (has no administration in time range)  heparin ADULT infusion 100 units/mL  (25000 units/250mL) (1,400 Units/hr Intravenous New Bag/Given 09/04/23 2136)  atorvastatin  (LIPITOR) tablet 80 mg (has no administration in time range)  empagliflozin  (JARDIANCE ) tablet 25 mg (has no administration in time range)  insulin  glargine-yfgn (SEMGLEE ) injection 60 Units (has no administration in time range)  albuterol  (PROVENTIL ) (2.5 MG/3ML) 0.083% nebulizer solution 2.5 mg (has no administration in time range)   stroke: early stages of recovery book (has no administration in time range)  acetaminophen  (TYLENOL ) tablet 650 mg (has no administration in time range)    Or  acetaminophen  (TYLENOL ) 160 MG/5ML solution 650 mg (has no administration in time range)    Or  acetaminophen  (TYLENOL ) suppository 650 mg (has no administration in time range)  insulin  aspart (novoLOG ) injection 0-20 Units (has no administration in time range)  insulin  aspart (novoLOG ) injection 0-5 Units (2 Units Subcutaneous Given 09/04/23 2145)  gadobutrol (GADAVIST) 1 MMOL/ML injection 10 mL (has no administration in time range)  aspirin chewable tablet 324 mg (324 mg Oral Given 09/04/23 2002)  warfarin (COUMADIN ) tablet 10 mg (10 mg Oral Given 09/04/23 2129)    Mobility walks     Focused Assessments Neuro Assessment Handoff:  Swallow screen pass? Yes  Cardiac Rhythm: Normal sinus rhythm NIH Stroke Scale  Dizziness Present: No Headache Present: Yes Interval: Shift assessment Level of Consciousness (1a.)   : Alert, keenly responsive LOC Questions (1b. )   :  Answers both questions correctly LOC Commands (1c. )   : Performs both tasks correctly Best Gaze (2. )  : Normal Visual (3. )  : No visual loss Facial Palsy (4. )    : Normal symmetrical movements Motor Arm, Left (5a. )   : No drift Motor Arm, Right (5b. ) : No drift Motor Leg, Left (6a. )  : No drift Motor Leg, Right (6b. ) : No drift Limb Ataxia (7. ): Absent Sensory (8. )  : Normal, no sensory loss Best Language (9. )  : No  aphasia Dysarthria (10. ): Normal Extinction/Inattention (11.)   : No Abnormality Complete NIHSS TOTAL: 0 Last date known well: 09/04/23 Last time known well: 1550 Neuro Assessment:   Neuro Checks:   Shift assessment (09/04/23 2000)  Has TPA been given? No If patient is a Neuro Trauma and patient is going to OR before floor call report to 4N Charge nurse: [phone] or [phone]   R Recommendations: See Admitting Provider Note  Report given to:   Additional Notes: Last NIH was 0

## 2023-09-04 NOTE — ED Triage Notes (Addendum)
 Pt to ED via POV from home. Pt reports around 3:50pm started to have slurred speech and HA. Slurred speech has resolved. Pt reports still has a 5/10 HA. No N/V/D, vision changes, CP, SOB or weakness/numbness. Pt speech clear on arrival. Pt A&Ox4. Pt is on blood thinner from 2 prior CVAs  EDP consulted

## 2023-09-04 NOTE — Assessment & Plan Note (Signed)
 History of mechanical aortic valve placement in 2006 INR 1.4 Will start heparin drip without bolus to bridge to therapeutic INR Pharmacy consulted to manage both Coumadin  and heparin Monitor for any changes in neurologic status anticoagulation but at this time we believe benefit outweighs risk and mechanical heart

## 2023-09-04 NOTE — ED Provider Notes (Signed)
 [facility] Provider Note    Event Date/Time   First MD Initiated Contact with Patient 09/04/23 1933     (approximate)   History   Aphasia   HPI  Rodney Dixon. is a 63 y.o. male past medical history significant for bicuspid aortic valve with aortic valve mechanical replacement on Coumadin , paroxysmal atrial fibrillation, prior CVA, hypertension, hyperlipidemia, who presents to the emergency department following an episode of difficulty with word finding.  Patient states that he had an episode that lasted for approximately 30 minutes that was witnessed by his wife of trouble with getting any words out that he wanted to say.  States that he knew the words he wanted to say but they would not come out correctly.  This lasted for approximately 30 minutes.  Mild headache.  States that his symptoms of speech have resolved and he is now speaking normally.  Continues to complain of a mild headache.  No change in vision.  No extremity numbness or weakness.  Has been told in the past that he had a prior CVA that was noted on CT scan of the head but has never had symptoms with that.  Did note a small area to his right leg that had some redness.  No fever or chills.  Endorses compliance with his Coumadin .  States that he has recently been out of his antihypertensive medications and just recently restarted.     Physical Exam   Triage Vital Signs: ED Triage Vitals  Encounter Vitals Group     BP 09/04/23 1720 (!) 177/98     Systolic BP Percentile --      Diastolic BP Percentile --      Pulse Rate 09/04/23 1720 95     Resp 09/04/23 1720 20     Temp 09/04/23 1720 98.1 F (36.7 C)     Temp Source 09/04/23 1720 Oral     SpO2 09/04/23 1720 98 %     Weight --      Height --      Head Circumference --      Peak Flow --      Pain Score 09/04/23 1721 5     Pain Loc --      Pain Education --      Exclude from Growth Chart --     Most recent vital signs: Vitals:    09/04/23 1720  BP: (!) 177/98  Pulse: 95  Resp: 20  Temp: 98.1 F (36.7 C)  SpO2: 98%    Physical Exam Constitutional:      Appearance: He is well-developed.  HENT:     Head: Atraumatic.  Eyes:     Conjunctiva/sclera: Conjunctivae normal.  Cardiovascular:     Rate and Rhythm: Regular rhythm.  Pulmonary:     Effort: No respiratory distress.  Abdominal:     Tenderness: There is no abdominal tenderness.  Musculoskeletal:     Cervical back: Normal range of motion.     Right lower leg: No edema.     Left lower leg: No edema.     Comments: Mild erythema to the right lower leg with no surrounding warmth or induration.  No crepitance.  No unilateral leg swelling.  Skin:    General: Skin is warm.  Neurological:     Mental Status: He is alert. Mental status is at baseline.     GCS: GCS eye subscore is 4. GCS verbal subscore is 5. GCS motor subscore is 6.  Cranial Nerves: Cranial nerves 2-12 are intact.     Sensory: Sensation is intact.     Motor: Motor function is intact.     Coordination: Coordination is intact.  Psychiatric:        Mood and Affect: Mood normal.     IMPRESSION / MDM / ASSESSMENT AND PLAN / ED COURSE  I reviewed the triage vital signs and the nursing notes.  Differential diagnosis including CVA/TIA, intracranial hemorrhage, complex migraines, electrolyte abnormality.  Have low suspicion for giant cell arteritis.  No thunderclap headache have a low suspicion for subarachnoid hemorrhage.  EKG  I, Viviano Ground, the attending physician, personally viewed and interpreted this ECG.   Rate: Normal  Rhythm: Normal sinus  Axis: Normal  Intervals: Normal  ST&T Change: None  No tachycardic or bradycardic dysrhythmias while on cardiac telemetry.  RADIOLOGY I independently reviewed imaging, my interpretation of imaging: CT scan of the head with no signs of intracranial hemorrhage.  Read as old lacunar infarct but no other acute intracranial  findings.  LABS (all labs ordered are listed, but only abnormal results are displayed) Labs interpreted as -    Labs Reviewed  PROTIME-INR - Abnormal; Notable for the following components:      Result Value   Prothrombin Time 17.0 (*)    INR 1.4 (*)    All other components within normal limits  CBC - Abnormal; Notable for the following components:   WBC 10.9 (*)    All other components within normal limits  COMPREHENSIVE METABOLIC PANEL WITH GFR - Abnormal; Notable for the following components:   Sodium 133 (*)    Chloride 97 (*)    Glucose, Bld 231 (*)    All other components within normal limits  APTT  DIFFERENTIAL  ETHANOL     MDM  INR subtherapeutic at 1.4.  Mild leukocytosis of 10.9.  Hemoglobin stable.  Creatinine at baseline.  No significant electrolyte abnormality. CT scan of the head with no acute CVA.  Noted old CVA.  Significantly hypertensive on arrival to the emergency department is likely secondary to missing his antihypertensive medications.  Has no focal neurologic deficits at this time so not activated code stroke.  Based on his risk factors and subtherapeutic INR with significant hypertension will consult hospitalist for admission for TIA/CVA workup.    PROCEDURES:  Critical Care performed: No  Procedures  Patient's presentation is most consistent with acute presentation with potential threat to life or bodily function.   MEDICATIONS ORDERED IN ED: Medications  aspirin chewable tablet 324 mg (has no administration in time range)    FINAL CLINICAL IMPRESSION(S) / ED DIAGNOSES   Final diagnoses:  Expressive aphasia  Subtherapeutic international normalized ratio (INR)     Rx / DC Orders   ED Discharge Orders     None        Note:  This document was prepared using Dragon voice recognition software and may include unintentional dictation errors.   Viviano Ground, MD 09/04/23 [phone]

## 2023-09-05 ENCOUNTER — Other Ambulatory Visit: Payer: Self-pay

## 2023-09-05 ENCOUNTER — Ambulatory Visit: Payer: Self-pay | Admitting: Nurse Practitioner

## 2023-09-05 ENCOUNTER — Telehealth (HOSPITAL_COMMUNITY): Payer: Self-pay | Admitting: Pharmacy Technician

## 2023-09-05 ENCOUNTER — Observation Stay
Admit: 2023-09-05 | Discharge: 2023-09-05 | Disposition: A | Discharge status: Home / Self Care | Attending: Internal Medicine | Admitting: Internal Medicine

## 2023-09-05 ENCOUNTER — Other Ambulatory Visit (HOSPITAL_COMMUNITY): Payer: Self-pay

## 2023-09-05 DIAGNOSIS — R4789 Other speech disturbances: Secondary | ICD-10-CM | POA: Diagnosis not present

## 2023-09-05 DIAGNOSIS — G459 Transient cerebral ischemic attack, unspecified: Secondary | ICD-10-CM | POA: Diagnosis not present

## 2023-09-05 LAB — HEPARIN LEVEL (UNFRACTIONATED)
Heparin Unfractionated: <0.1 [IU]/mL — ABNORMAL LOW (ref 0.30–0.70)
Heparin Unfractionated: 0.13 [IU]/mL — ABNORMAL LOW (ref 0.30–0.70)

## 2023-09-05 LAB — CBC
HCT: 44.9 % (ref 39.0–52.0)
Hemoglobin: 15.4 g/dL (ref 13.0–17.0)
MCH: 28.4 pg (ref 26.0–34.0)
MCHC: 34.3 g/dL (ref 30.0–36.0)
MCV: 82.7 fL (ref 80.0–100.0)
Platelets: 195 10*3/uL (ref 150–400)
RBC: 5.43 MIL/uL (ref 4.22–5.81)
RDW: 12.5 % (ref 11.5–15.5)
WBC: 10.6 10*3/uL — ABNORMAL HIGH (ref 4.0–10.5)
nRBC: 0 % (ref 0.0–0.2)

## 2023-09-05 LAB — URINE DRUG SCREEN, QUALITATIVE (ARMC ONLY)
Amphetamines, Ur Screen: NOT DETECTED
Barbiturates, Ur Screen: NOT DETECTED
Benzodiazepine, Ur Scrn: NOT DETECTED
Cannabinoid 50 Ng, Ur ~~LOC~~: NOT DETECTED
Cocaine Metabolite,Ur ~~LOC~~: NOT DETECTED
MDMA (Ecstasy)Ur Screen: NOT DETECTED
Methadone Scn, Ur: NOT DETECTED
Opiate, Ur Screen: NOT DETECTED
Phencyclidine (PCP) Ur S: NOT DETECTED
Tricyclic, Ur Screen: NOT DETECTED

## 2023-09-05 LAB — URINALYSIS, COMPLETE (UACMP) WITH MICROSCOPIC
Bacteria, UA: NONE SEEN
Bilirubin Urine: NEGATIVE
Glucose, UA: >=500 mg/dL — AB
Hgb urine dipstick: NEGATIVE
Ketones, ur: NEGATIVE mg/dL
Leukocytes,Ua: NEGATIVE
Nitrite: NEGATIVE
Protein, ur: 30 mg/dL — AB
Specific Gravity, Urine: 1.016 (ref 1.005–1.030)
Squamous Epithelial / HPF: 0 /HPF (ref 0–5)
WBC, UA: 0 WBC/hpf (ref 0–5)
pH: 6 (ref 5.0–8.0)

## 2023-09-05 LAB — LIPID PANEL
Cholesterol: 116 mg/dL (ref 0–200)
HDL: 24 mg/dL — ABNORMAL LOW (ref >40)
LDL Cholesterol: 36 mg/dL (ref 0–99)
Total CHOL/HDL Ratio: 4.8 ratio
Triglycerides: 280 mg/dL — ABNORMAL HIGH (ref <150)
VLDL: 56 mg/dL — ABNORMAL HIGH (ref 0–40)

## 2023-09-05 LAB — PROTIME-INR
INR: 1.4 — ABNORMAL HIGH (ref 0.8–1.2)
Prothrombin Time: 17 s — ABNORMAL HIGH (ref 11.4–15.2)

## 2023-09-05 LAB — HIV ANTIBODY (ROUTINE TESTING W REFLEX): HIV Screen 4th Generation wRfx: NONREACTIVE

## 2023-09-05 LAB — GLUCOSE, CAPILLARY
Glucose-Capillary: 118 mg/dL — ABNORMAL HIGH (ref 70–99)
Glucose-Capillary: 264 mg/dL — ABNORMAL HIGH (ref 70–99)
Glucose-Capillary: 272 mg/dL — ABNORMAL HIGH (ref 70–99)
Glucose-Capillary: 319 mg/dL — ABNORMAL HIGH (ref 70–99)

## 2023-09-05 LAB — HEMOGLOBIN A1C
Hgb A1c MFr Bld: 12.3 % — ABNORMAL HIGH (ref 4.8–5.6)
Mean Plasma Glucose: 306.31 mg/dL

## 2023-09-05 MED ORDER — ENOXAPARIN SODIUM 120 MG/0.8ML IJ SOSY
1.0000 mg/kg | PREFILLED_SYRINGE | Freq: Two times a day (BID) | INTRAMUSCULAR | Status: DC
  Administered 2023-09-05: 120 mg via SUBCUTANEOUS
  Filled 2023-09-05 (×3): qty 0.8

## 2023-09-05 MED ORDER — GABAPENTIN 300 MG PO CAPS
600.0000 mg | ORAL_CAPSULE | Freq: Every day | ORAL | Status: DC
  Administered 2023-09-05: 600 mg via ORAL
  Filled 2023-09-05: qty 2

## 2023-09-05 MED ORDER — WARFARIN SODIUM 5 MG PO TABS
10.0000 mg | ORAL_TABLET | Freq: Every day | ORAL | 0 refills | Status: DC
  Filled 2023-09-05: qty 20, 10d supply, fill #0

## 2023-09-05 MED ORDER — ENOXAPARIN SODIUM 120 MG/0.8ML IJ SOSY
120.0000 mg | PREFILLED_SYRINGE | Freq: Two times a day (BID) | INTRAMUSCULAR | 0 refills | Status: DC
  Filled 2023-09-05: qty 8, 5d supply, fill #0

## 2023-09-05 MED ORDER — WARFARIN SODIUM 6 MG PO TABS
12.0000 mg | ORAL_TABLET | Freq: Once | ORAL | Status: AC
  Administered 2023-09-05: 12 mg via ORAL
  Filled 2023-09-05: qty 2

## 2023-09-05 NOTE — Plan of Care (Signed)
  Problem: Education: Goal: Ability to describe self-care measures that may prevent or decrease complications (Diabetes Survival Skills Education) will improve Outcome: Progressing Goal: Individualized Educational Video(s) Outcome: Progressing   Problem: Coping: Goal: Ability to adjust to condition or change in health will improve Outcome: Progressing   Problem: Fluid Volume: Goal: Ability to maintain a balanced intake and output will improve Outcome: Progressing   Problem: Health Behavior/Discharge Planning: Goal: Ability to identify and utilize available resources and services will improve Outcome: Progressing Goal: Ability to manage health-related needs will improve Outcome: Progressing   Problem: Metabolic: Goal: Ability to maintain appropriate glucose levels will improve Outcome: Progressing   Problem: Nutritional: Goal: Maintenance of adequate nutrition will improve Outcome: Progressing Goal: Progress toward achieving an optimal weight will improve Outcome: Progressing   Problem: Skin Integrity: Goal: Risk for impaired skin integrity will decrease Outcome: Progressing   Problem: Tissue Perfusion: Goal: Adequacy of tissue perfusion will improve Outcome: Progressing   Problem: Education: Goal: Knowledge of disease or condition will improve Outcome: Progressing Goal: Knowledge of secondary prevention will improve (MUST DOCUMENT ALL) Outcome: Progressing Goal: Knowledge of patient specific risk factors will improve (DELETE if not current risk factor) Outcome: Progressing   Problem: Ischemic Stroke/TIA Tissue Perfusion: Goal: Complications of ischemic stroke/TIA will be minimized Outcome: Progressing   Problem: Coping: Goal: Will verbalize positive feelings about self Outcome: Progressing Goal: Will identify appropriate support needs Outcome: Progressing   Problem: Health Behavior/Discharge Planning: Goal: Ability to manage health-related needs will  improve Outcome: Progressing Goal: Goals will be collaboratively established with patient/family Outcome: Progressing   Problem: Self-Care: Goal: Ability to participate in self-care as condition permits will improve Outcome: Progressing Goal: Verbalization of feelings and concerns over difficulty with self-care will improve Outcome: Progressing Goal: Ability to communicate needs accurately will improve Outcome: Progressing   Problem: Nutrition: Goal: Risk of aspiration will decrease Outcome: Progressing Goal: Dietary intake will improve Outcome: Progressing   Problem: Education: Goal: Knowledge of General Education information will improve Description: Including pain rating scale, medication(s)/side effects and non-pharmacologic comfort measures Outcome: Progressing   Problem: Health Behavior/Discharge Planning: Goal: Ability to manage health-related needs will improve Outcome: Progressing   Problem: Clinical Measurements: Goal: Ability to maintain clinical measurements within normal limits will improve Outcome: Progressing Goal: Will remain free from infection Outcome: Progressing Goal: Diagnostic test results will improve Outcome: Progressing Goal: Respiratory complications will improve Outcome: Progressing Goal: Cardiovascular complication will be avoided Outcome: Progressing   Problem: Nutrition: Goal: Adequate nutrition will be maintained Outcome: Progressing   Problem: Coping: Goal: Level of anxiety will decrease Outcome: Progressing   Problem: Elimination: Goal: Will not experience complications related to bowel motility Outcome: Progressing Goal: Will not experience complications related to urinary retention Outcome: Progressing   Problem: Pain Managment: Goal: General experience of comfort will improve and/or be controlled Outcome: Progressing   Problem: Safety: Goal: Ability to remain free from injury will improve Outcome: Progressing   Problem:  Skin Integrity: Goal: Risk for impaired skin integrity will decrease Outcome: Progressing

## 2023-09-05 NOTE — Progress Notes (Signed)
 SLP Cancellation Note  Patient Details Name: Rodney Dixon. MRN: [id number] DOB: Apr 19, 1961   Cancelled treatment:       Reason Eval/Treat Not Completed: SLP screened, no needs identified, will sign off (Spoke with pt and wife. Both agree pt's speech changes on admit have since resolved.)  Dia Forget, M.S., CCC-SLP Speech-Language Pathologist [facility] [phone] [place])   Adin Honour 09/05/2023, 8:47 AM

## 2023-09-05 NOTE — Progress Notes (Signed)
*  PRELIMINARY RESULTS* Echocardiogram 2D Echocardiogram has been performed.  Rodney Dixon 09/05/2023, 1:59 PM

## 2023-09-05 NOTE — Progress Notes (Unsigned)
   09/05/2023  Patient ID: Rodney Coombs [name], male   DOB: 30-Mar-1961, 63 y.o.   MRN: [id number]  Patient outreach for telephone follow-up visit.  I was able to reach Rodney Dixon, but he is currently hospitalized after ED visit yesterday due to episode of aphasia lasting approximately 30 minutes.  INR was subtherapeutic and BP elevated- patient endorsed adherence to warfarin but stated he had been out of his BP medications.  Informing PCP (patient supposed to see her this afternoon).  He will contact me once home; but if I don't hear from him by the end of the week (if discharged), I will reach out to make sure he has all medications.  Linn Rich, PharmD, DPLA

## 2023-09-05 NOTE — Consult Note (Signed)
 PHARMACY - ANTICOAGULATION CONSULT NOTE  Pharmacy Consult for warfarin and heparin infusion Indication:  mechanical aortic valve and Afib  No Known Allergies  Patient Measurements: Height: 5\' 8"  (172.7 cm) Weight: 118.2 kg (260 lb 9.3 oz) IBW/kg (Calculated) : 68.4 HEPARIN DW (KG): 95.3  Vital Signs: Temp: 98.2 F (36.8 C) (04/29 0957) BP: 152/83 (04/29 0957) Pulse Rate: 84 (04/29 0957)  Labs: Recent Labs    09/04/23 1726 09/05/23 0242 09/05/23 1053  HGB 16.3 15.4  --   HCT 48.6 44.9  --   PLT 197 195  --   APTT 31  --   --   LABPROT 17.0* 17.0*  --   INR 1.4* 1.4*  --   HEPARINUNFRC  --  <0.10* 0.13*  CREATININE 1.00  --   --     Estimated Creatinine Clearance: 94.4 mL/min (by C-G formula based on SCr of 1 mg/dL).   Medical History: Past Medical History:  Diagnosis Date   Allergy    Diabetes mellitus without complication (HCC)    Hyperlipidemia    Hypertension     PTA warfarin dose Warfarin 8mg  po daily - last INR (POC) 2.7 on 2/17 Patient reports last warfarin dose yesterday 4/27  Assessment: 63 yo male presents to ED with aphasia. Noted PMH includes bicuspid aortic valve with aortic valve mechanical replacement on Coumadin , paroxysmal atrial fibrillation, prior CVA, hypertension, and hyperlipidemia.   Pharmacy consulted to manage warfarin while inpatient and bridge with heparin infusion until INR at therapeutic range. INR 1.4 on admission and reported therapy compliance by patient. NO BOLUS per MD consult and cva symptoms on admission.  Goal of Therapy:  INR 2.5 - 3.5  per office visit note 06/26/23 Heparin level 0.3-0.5 for TIA/stroke   04/29 0242 HL <0.1, subtherapeutic  4/29 1053 HL 0.13,   subtherapeutic    4/28 INR 1.4 Warfarin 10 mg 4/29 INR 1.4  Plan:  Heparin: 4/29 1053 HL 0.13,   subtherapeutic Increase heparin infusion to 1850 units/hr Recheck heparin level in 6 hours and daily while on heparin Continue to monitor H&H and  platelets  Warfarin: INR 1.4 Subtherapeutic Will order Warfarin 12 mg po x 1  CBC per protocol INR daily  Yashas Camilli PharmD Clinical Pharmacist 09/05/2023

## 2023-09-05 NOTE — Consult Note (Signed)
 PHARMACY - ANTICOAGULATION CONSULT NOTE  Pharmacy Consult for warfarin and heparin infusion Indication:  mechanical valve and Afib  No Known Allergies  Patient Measurements: Height: 5\' 8"  (172.7 cm) Weight: 118.2 kg (260 lb 9.3 oz) IBW/kg (Calculated) : 68.4 HEPARIN DW (KG): 95.3  Vital Signs: Temp: 98.1 F (36.7 C) (04/29 0025) Temp Source: Oral (04/28 2139) BP: 172/83 (04/29 0025) Pulse Rate: 80 (04/29 0025)  Labs: Recent Labs    09/04/23 1726 09/05/23 0242  HGB 16.3 15.4  HCT 48.6 44.9  PLT 197 195  APTT 31  --   LABPROT 17.0* 17.0*  INR 1.4* 1.4*  HEPARINUNFRC  --  <0.10*  CREATININE 1.00  --     Estimated Creatinine Clearance: 94.4 mL/min (by C-G formula based on SCr of 1 mg/dL).   Medical History: Past Medical History:  Diagnosis Date   Allergy    Diabetes mellitus without complication (HCC)    Hyperlipidemia    Hypertension     PTA warfarin dose Warfarin 8mg  po daily - last INR (POC) 2.7 on 2/17 Patient reports last warfarin dose yesterday 4/27  Assessment: 63 yo male presents to ED with aphasia. Noted PMH includes bicuspid aortic valve with aortic valve mechanical replacement on Coumadin , paroxysmal atrial fibrillation, prior CVA, hypertension, and hyperlipidemia.   Pharmacy consulted to manage warfarin while inpatient and bridge with heparin infusion until INR at therapeutic range. INR 1.4 on admission and reported therapy compliance by patient. NO BOLUS per MD consult and cva symptoms on admission.  Goal of Therapy:  INR 2.5 - 3.5 Heparin level 0.3-0.5  04/29 0242 HL <0.1, subtherapeutic / INR 1.4   Plan:  Increase heparin infusion to 1600 units/hr Recheck heparin level in 6 hours and daily while on heparin Continue to monitor H&H and platelets  Coretta Dexter, PharmD, [facility] 09/05/2023 4:29 AM

## 2023-09-05 NOTE — Telephone Encounter (Signed)
 Patient Product/process development scientist completed.    The patient is insured through [employer]. Patient has [employer], may use a copay card, and/or apply for patient assistance if available.    Ran test claim for V-Go 20 20 unit/24 hr kt and the current 30 day co-pay is $195.00.   This test claim was processed through [facility]- copay amounts may vary at other pharmacies due to pharmacy/plan contracts, or as the patient moves through the different stages of their insurance plan.     Morgan Arab, CPHT Pharmacy Technician III Certified Patient Advocate [facility] Patient Advocate Team Direct Number: [phone]  Fax: [phone]

## 2023-09-05 NOTE — Consult Note (Signed)
 PHARMACY - ANTICOAGULATION CONSULT NOTE  Pharmacy Consult for warfarin and heparin infusion Indication:  mechanical aortic valve and Afib  No Known Allergies  Patient Measurements: Height: 5\' 8"  (172.7 cm) Weight: 118.2 kg (260 lb 9.3 oz) IBW/kg (Calculated) : 68.4 HEPARIN DW (KG): 95.3  Vital Signs: Temp: 98.4 F (36.9 C) (04/29 0452) Temp Source: Oral (04/28 2139) BP: 143/92 (04/29 0452) Pulse Rate: 80 (04/29 0452)  Labs: Recent Labs    09/04/23 1726 09/05/23 0242  HGB 16.3 15.4  HCT 48.6 44.9  PLT 197 195  APTT 31  --   LABPROT 17.0* 17.0*  INR 1.4* 1.4*  HEPARINUNFRC  --  <0.10*  CREATININE 1.00  --     Estimated Creatinine Clearance: 94.4 mL/min (by C-G formula based on SCr of 1 mg/dL).   Medical History: Past Medical History:  Diagnosis Date   Allergy    Diabetes mellitus without complication (HCC)    Hyperlipidemia    Hypertension     PTA warfarin dose Warfarin 8mg  po daily - last INR (POC) 2.7 on 2/17 Patient reports last warfarin dose yesterday 4/27  Assessment: 63 yo male presents to ED with aphasia. Noted PMH includes bicuspid aortic valve with aortic valve mechanical replacement on Coumadin , paroxysmal atrial fibrillation, prior CVA, hypertension, and hyperlipidemia.   Pharmacy consulted to manage warfarin while inpatient and bridge with heparin infusion until INR at therapeutic range. INR 1.4 on admission and reported therapy compliance by patient. NO BOLUS per MD consult and cva symptoms on admission.  Goal of Therapy:  INR 2.5 - 3.5  per office visit note 06/26/23 Heparin level 0.3-0.5 for TIA/stroke  04/29 0242 HL <0.1, subtherapeutic    4/28 INR 1.4 Warfarin 10 mg 4/29 INR 1.4  Plan:  Heparin: Increase heparin infusion to 1600 units/hr Recheck heparin level in 6 hours and daily while on heparin Continue to monitor H&H and platelets  Warfarin: INR 1.4 Subtherapeutic Will order Warfarin 12 mg po x 1  CBC per protocol INR  daily  Dru Primeau PharmD Clinical Pharmacist 09/05/2023

## 2023-09-05 NOTE — Consult Note (Signed)
 NEUROLOGY CONSULT NOTE   Date of service: September 05, 2023 Patient Name: Rodney Dixon. MRN:  [id number] DOB:  1961/02/22 Chief Complaint: "speech difficulty" Requesting Provider: Ezzard Holms, MD  History of Present Illness  Rodney Dixon. 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  No episodes of vision loss, hearing or swallowing difficulties, or unilateral weakness were reported.  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 He is on warfarin therapy and mentions missing two appointments due to insurance issues, leading to a lapse in medication monitoring. Despite this, he continued taking warfarin as he had a supply saved up. He attributes his INR being low recently, possibly due to dietary changes or missed appointments.  He has diabetes and was previously managed with long-acting insulin , taking 60 units at night, and mealtime insulin , which he does not use consistently. He does not count carbohydrates and describes his diet as including biscuits, gravy, fish, and chicken. He mentions a past A1c of 7 when under the care of a specialist who has since retired.  No blood in urine or stool, dizziness, cough, cold, fever, or chills.   He reports a sore on his right posterior calf and on the back of his right hand secondary to picking at his skin  He is on atorvastatin , 80 mg, for cholesterol management,  He denies smoking and quit drinking a long time ago.  LKW: 3:50 PM Modified rankin score: 0 IV Thrombolysis:  No, symptoms resolved  EVT: No, symptoms resolved  NIHSS: 0    ROS  Comprehensive ROS performed and pertinent positives documented in HPI    Past History   Past Medical History:  Diagnosis Date   Allergy    Diabetes mellitus without complication (HCC)    Hyperlipidemia    Hypertension     Past Surgical History:  Procedure Laterality Date   CARDIAC VALVE REPLACEMENT     COLONOSCOPY WITH PROPOFOL  N/A 08/19/2019   Procedure: COLONOSCOPY WITH PROPOFOL ;  Surgeon: Luke Salaam, MD;  Location: [facility] ENDOSCOPY;  Service: Gastroenterology;  Laterality: N/A;    Family History: Family History  Problem Relation Age of Onset   Diabetes Mother    Heart disease Mother    Hyperlipidemia Mother    Hypertension Mother     Social History  reports that he has never smoked. He has never used smokeless tobacco. He reports that he does not drink alcohol and does not use drugs.  No Known Allergies  Medications   Current Facility-Administered Medications:     stroke: early stages of recovery book, , Does not apply, Once, Lanetta Pion, MD   acetaminophen  (TYLENOL ) tablet 650 mg, 650 mg, Oral, Q4H PRN **OR** acetaminophen  (TYLENOL ) 160 MG/5ML solution 650 mg, 650 mg, Per Tube, Q4H PRN **OR** acetaminophen  (TYLENOL ) suppository 650 mg, 650 mg, Rectal, Q4H PRN, Lanetta Pion, MD   albuterol  (PROVENTIL ) (2.5 MG/3ML) 0.083% nebulizer solution 2.5 mg, 2.5 mg, Inhalation, Q6H PRN, Lanetta Pion, MD   atorvastatin  (LIPITOR) tablet 80 mg, 80 mg, Oral, Daily, Duncan, Hazel V, MD   empagliflozin  (JARDIANCE )  tablet 25 mg, 25 mg, Oral, q morning, Vallarie Gauze, Herbie Loll, MD   gabapentin  (NEURONTIN ) capsule 600 mg, 600 mg, Oral, QHS, Duncan, Hazel V, MD, 600 mg at 09/05/23 0045   heparin ADULT infusion 100 units/mL (25000 units/250mL), 1,600 Units/hr, Intravenous, Continuous, Coretta Dexter, RPH, Last Rate: 16 mL/hr at 09/05/23 0439, 1,600 Units/hr at 09/05/23 0439   insulin  aspart (novoLOG ) injection 0-20  Units, 0-20 Units, Subcutaneous, TID WC, Lanetta Pion, MD   insulin  aspart (novoLOG ) injection 0-5 Units, 0-5 Units, Subcutaneous, QHS, Lanetta Pion, MD, 2 Units at 09/30/2023 2145   insulin  glargine-yfgn (SEMGLEE ) injection 60 Units, 60 Units, Subcutaneous, Daily, Lanetta Pion, MD   Warfarin - Pharmacist Dosing Inpatient, , Does not apply, q1600, Lanetta Pion, MD  Vitals   Vitals:   30-Sep-2023 2139 09-30-23 2153 09/05/23 0025 09/05/23 0452  BP:  (!) 164/110 (!) 172/83 (!) 143/92  Pulse:   80 80  Resp:   16 16  Temp: 97.8 F (36.6 C)  98.1 F (36.7 C) 98.4 F (36.9 C)  TempSrc: Oral     SpO2:   91% 92%  Weight:   118.2 kg   Height:   5\' 8"  (1.727 m)     Body mass index is 39.62 kg/m.  Physical Exam   Constitutional: Appears well-developed and well-nourished.  Psych: Affect appropriate to situation, calm and cooperative Eyes: No scleral injection HENT: No oropharyngeal obstruction.  Very thick neck.  Mallampati score of I MSK: no major joint deformities.  Cardiovascular: Normal rate and regular rhythm. Perfusing extremities well Respiratory: Effort normal, non-labored breathing GI: Obese, protuberant, nontender Skin: Healing lesion on the right calf and on the right dorsal wrist  Neurologic Examination   Mental Status: Patient is awake, alert, oriented to person, place, month, year, and situation. Patient is able to give a clear and coherent history. No signs of aphasia or neglect Cranial Nerves: II: Visual Fields are full. Pupils are equal, round, and reactive to light.   III,IV, VI: EOMI without ptosis or diploplia.  V: Facial sensation is symmetric to temperature VII: Facial movement is symmetric.  VIII: hearing is intact to voice X: Uvula elevates symmetrically XI: Shoulder shrug is symmetric. XII: tongue is midline without atrophy or fasciculations.  Motor: 5/5 strength was present in all four extremities.  Sensory: Sensation is symmetric to light  touch and temperature in the arms and legs. Deep Tendon Reflexes: 2+ and symmetric in the brachioradialis and patellae.  Cerebellar: FNF and HKS are intact bilaterally Gait:  Deferred    Labs/Imaging/Neurodiagnostic studies   CBC:  Recent Labs  Lab 09-30-23 1726 09/05/23 0242  WBC 10.9* 10.6*  NEUTROABS 7.3  --   HGB 16.3 15.4  HCT 48.6 44.9  MCV 84.8 82.7  PLT 197 195   Basic Metabolic Panel:  Lab Results  Component Value Date   NA 133 (L) 09-30-2023   K 3.6 09/30/23   CO2 26 09/30/2023   GLUCOSE 231 (H) 09/30/2023   BUN 22 09-30-2023   CREATININE 1.00 2023/09/30   CALCIUM  9.2 09-30-2023   GFRNONAA >60 30-Sep-2023   GFRAA 77 06/17/2020   Lipid Panel:  Lab Results  Component Value Date   CHOL 116 09/05/2023   HDL 24 (L) 09/05/2023   LDLCALC 36 09/05/2023   TRIG 280 (H) 09/05/2023   CHOLHDL 4.8 09/05/2023      HgbA1c:  Lab Results  Component Value Date   HGBA1C 12.3 (H) Sep 30, 2023   Alcohol Level  Component Value Date/Time   [facility] <15 09/04/2023 1726   INR  Lab Results  Component Value Date   INR 1.4 (H) 09/05/2023   APTT  Lab Results  Component Value Date   APTT 31 09/04/2023     MRI Brain, MRA head, MRA neck (Personally reviewed):  MRI HEAD: 1. No acute intracranial abnormality. 2. Mild chronic microvascular ischemic disease with a few small remote lacunar infarcts involving the left basal ganglia and right frontal corona radiata. Probable tiny remote left cerebellar infarct.   MRA HEAD: 1. Negative intracranial MRA for large vessel occlusion. 2. Moderate stenoses involving the mid right P2 and distal left P2 segments. 3. Fetal type origin of the PCAs with overall diminutive vertebrobasilar system. Superimposed mild-to-moderate stenosis within the diminutive mid basilar artery. 4. Right vertebral artery terminates in PICA.   MRA NECK: Negative MRA of the neck for large vessel occlusion or other vascular abnormality. No  hemodynamically significant or correctable stenosis.  ASSESSMENT   CORDARRELL OLASCOAGA Dixon. is a 63 y.o. male presenting with TIA symptoms in the setting of subtherapeutic INR  RECOMMENDATIONS  - UA, UDS to complete stroke workup (ordered, resulted negative) - No antiplatelet as he is on anticoagulation  - LDL meeting goal, continue home atorvastatin  80 mg  - consider further treatment for significant hypertriglyceridemia per primary team/PCP, such as addition of the ezetimibe  - A1c well above goal at 12.3%,  - extensively counseled patient on the importance of glucose control, diet and exercise and weight loss - Counseled on importance of meeting INR goal - Goal BP normotension - No need for ECHO from neurology perspective as he already has an indication for anticoagulation, defer to primary whether indicated from a medical perspective  - Would have a low threshold for outpatient sleep study in this patient as his body habitus does put him at some risk of sleep apnea and he does report not feeling well rested in the mornings - Inpatient neurology will sign off at this time, but please do not hesitate to reach out if additional questions or concerns arise  ______________________________________________________________________   Baldwin Levee MD-PhD [facility] [phone]

## 2023-09-05 NOTE — Discharge Summary (Signed)
 Physician Discharge Summary   Patient: Rodney WARSHAW [name] MRN: [id number] DOB: 1960-09-08  Admit date:     09/04/2023  Discharge date: 09/05/23  Discharge Physician: Ezzard Holms   PCP: Lemar Pyles, NP   Recommendations at discharge:  Follow-up with Coumadin  clinic  Discharge Diagnoses:  TIA (transient ischemic attack) History of stroke on imaging Subtherapeutic international normalized ratio (INR) History of mechanical aortic valve placement in 2006 INR 1.4 Uncontrolled type 2 diabetes mellitus with hyperglycemia, with long-term current use of insulin  (HCC) Primary hypertension Benign prostatic hyperplasia without lower urinary tract symptoms Obesity, Class III, BMI 40-49.9 (morbid obesity) [facility])    Hospital Course:  Rodney Petteys. is a 63 y.o. male with medical history significant for Insulin -dependent type 2 diabetes, HTN, OSA, morbid obesity, mechanical aortic valve since 2006 on Coumadin , paroxysmal A-fib and prior CVA on past imaging, being admitted for stroke workup.   EKG, Personally reviewed and interpreted showing NSR at 77 with no concerning ST-T wave changes CT head showed old lacunar infarcts, stable punctate calcifications but no acute intracranial findings. MRI of the brain did not show any acute pathology. Patient has been seen by neurologist and has been cleared for discharge today.  Patient has agreed to go home with Lovenox  as well as warfarin bridge.  Has been taught how to do it and he tells me as well as nursing staff that he is comfortable.  He has a Coumadin  appointment upcoming  Consultants: Neurology Procedures performed: None Disposition: Home Diet recommendation:  Cardiac and Carb modified diet DISCHARGE MEDICATION: Allergies as of 09/05/2023   No Known Allergies      Medication List     STOP taking these medications    hydrochlorothiazide  25 MG tablet Commonly known as: HYDRODIURIL    mupirocin  ointment 2 % Commonly known as:  BACTROBAN    Urea  10 Hydrating 10 % cream Generic drug: urea        TAKE these medications    Accu-Chek Aviva Plus test strip Generic drug: glucose blood Use to check blood fasting blood glucose daily   Accu-Chek Aviva Plus w/Device Kit Use to check fasting blood glucose daily   Accu-Chek Softclix Lancets lancets Use to check blood fasting blood glucose daily   albuterol  108 (90 Base) MCG/ACT inhaler Commonly known as: VENTOLIN  HFA TAKE 2 PUFFS BY MOUTH EVERY 6 HOURS AS NEEDED   amLODipine  5 MG tablet Commonly known as: NORVASC  Take 1 tablet (5 mg total) by mouth daily.   atorvastatin  80 MG tablet Commonly known as: LIPITOR Take 1 tablet (80 mg total) by mouth daily.   Basaglar  KwikPen 100 UNIT/ML INJECT 60 UNITS INTO THE SKIN DAILY   benazepril  40 MG tablet Commonly known as: LOTENSIN  Take 1 tablet (40 mg total) by mouth daily.   carvedilol  25 MG tablet Commonly known as: COREG  TAKE 1 TABLET (25 MG TOTAL) BY MOUTH TWICE A DAY WITH MEALS   enoxaparin  120 MG/0.8ML injection Commonly known as: LOVENOX  Inject 0.8 mLs (120 mg total) into the skin every 12 (twelve) hours for 5 days. Start taking on: September 06, 2023   Fiasp  FlexTouch 100 UNIT/ML FlexTouch Pen Generic drug: insulin  aspart Inject 5 Units into the skin with breakfast, with lunch, and with evening meal. Please apply manufacturer copay card:bin [id number], pcn cnrx, id [id number], grp [id number] What changed: how much to take   gabapentin  600 MG tablet Commonly known as: Neurontin  Take 600 MG (one tablet) by mouth in morning, then 600 MG (one  tablet) by mouth at lunch, and then 1200 MG (two tablets) by mouth at night before bedtime. What changed:  how much to take how to take this when to take this additional instructions   Insulin  Pen Needle 32G X 4 MM Misc Use to inject insulin  4 times daily   Jardiance  25 MG Tabs tablet Generic drug: empagliflozin  Take 25 mg by mouth every morning.   sildenafil   100 MG tablet Commonly known as: Viagra  Take 0.5-1 tablets (50-100 mg total) by mouth daily as needed for erectile dysfunction.   warfarin 10 MG tablet Commonly known as: COUMADIN  Take 8 mg by mouth daily. What changed: Another medication with the same name was removed. Continue taking this medication, and follow the directions you see here.        Follow-up Information     Lemar Pyles, NP Follow up.   Specialty: Nurse Practitioner Why: Hospital follow up Contact information: [address] [place] [address] [phone]                Discharge Exam: Rodney Dixon   09/04/23 2039 09/05/23 0025  Weight: 118.2 kg 118.2 kg   Vitals and nursing note reviewed.  Constitutional:      General: He is not in acute distress. HENT:     Head: Normocephalic and atraumatic.  Cardiovascular:     Rate and Rhythm: Normal rate and regular rhythm.     Heart sounds: Normal heart sounds.  Pulmonary:     Effort: Pulmonary effort is normal.     Breath sounds: Normal breath sounds.  Abdominal:     Palpations: Abdomen is soft.     Tenderness: There is no abdominal tenderness.  Neurological:     General: No focal deficit present.     Mental Status: Mental status is at baseline  Condition at discharge: good  The results of significant diagnostics from this hospitalization (including imaging, microbiology, ancillary and laboratory) are listed below for reference.   Imaging Studies: MR BRAIN WO CONTRAST Result Date: 09/05/2023 CLINICAL DATA:  Initial evaluation for acute TIA. EXAM: MRI HEAD WITHOUT CONTRAST MRA HEAD WITHOUT CONTRAST MRA NECK WITHOUT AND WITH CONTRAST TECHNIQUE: Multiplanar, multi-echo pulse sequences of the brain and surrounding structures were acquired without intravenous contrast. Angiographic images of the Circle of Willis were acquired using MRA technique without intravenous contrast. Angiographic images of the neck were acquired using MRA technique without  and with intravenous contrast. Carotid stenosis measurements (when applicable) are obtained utilizing NASCET criteria, using the distal internal carotid diameter as the denominator. CONTRAST:  10mL GADAVIST GADOBUTROL 1 MMOL/ML IV SOLN COMPARISON:  CT from earlier the same day. FINDINGS: MRI HEAD FINDINGS Brain: Cerebral volume within normal limits. Mild hazy T2/FLAIR hyperintensity involving the periventricular white matter, most characteristic of chronic microvascular ischemic disease. Few small remote lacunar infarcts noted about the left basal ganglia and right frontal corona radiata. Probable tiny remote left cerebellar infarct (series 10, image 7). No evidence for acute or subacute infarct. Gray-white matter differentiation maintained. No areas of chronic cortical infarction. No acute intracranial hemorrhage. Few scattered punctate foci of susceptibility artifact noted, likely related to small parenchymal calcifications as seen on prior CT. Findings likely reflect sequelae of a prior infectious or inflammatory process. No mass lesion, midline shift or mass effect no hydrocephalus or extra-axial fluid collection. Pituitary gland and suprasellar region within normal limits. Vascular: Major intracranial vascular flow voids are maintained. Skull and upper cervical spine: Cranial junction within normal limits. Bone  marrow signal intensity normal. No scalp soft tissue abnormality. Sinuses/Orbits: Globes orbital soft tissues within normal limits. Mild mucosal thickening with a few retention cysts noted about the maxillary sinuses. Paranasal sinuses are otherwise clear. No mastoid effusion. Other: None. MRA HEAD FINDINGS Anterior circulation: Both internal carotid arteries are patent to the siphons without stenosis. A1 segments patent bilaterally. Normal anterior communicating artery complex. Anterior cerebral arteries patent without significant stenosis. No M1 stenosis or occlusion. Left M1 bifurcates early. Distal  MCA branches perfused and symmetric. Posterior circulation: Left V4 segment widely patent to the vertebrobasilar junction. Right vertebral artery not well visualized on this exam, likely terminating in PICA. Partially visualized PICA are patent. Basilar diminutive with focal mild-to-moderate stenosis at its mid aspect (series 1053, image 6). Superior cerebral arteries patent bilaterally. Fetal type origin of the PCAs bilaterally. Moderate stenoses involving the mid right P2 and distal left P2 segments noted (series 1060, image 5). PCAs remain patent to their distal aspects. Anatomic variants: Fetal type origin of the PCAs with overall diminutive vertebrobasilar system. Right vertebral artery terminates in PICA. No intracranial aneurysm. MRA NECK FINDINGS Aortic arch: Visualized aortic arch within normal limits for caliber with standard 3 vessel morphology. No stenosis about the origin the great vessels. Right carotid system: Right common and internal carotid arteries are patent with antegrade flow. No evidence for dissection. No hemodynamically significant stenosis about the right carotid artery system. Left carotid system: Left common and internal carotid arteries are patent with antegrade flow. No evidence for dissection. Mild atheromatous irregularity about the left carotid bulb without hemodynamically significant stenosis. Vertebral arteries: Both vertebral arteries arise from subclavian arteries. No visible proximal subclavian artery stenosis. Left vertebral artery slightly dominant. Vertebral arteries patent with antegrade flow. No evidence for dissection. No hemodynamically significant stenosis. Diminutive right vertebral artery terminates in PICA. Other: None IMPRESSION: MRI HEAD: 1. No acute intracranial abnormality. 2. Mild chronic microvascular ischemic disease with a few small remote lacunar infarcts involving the left basal ganglia and right frontal corona radiata. Probable tiny remote left cerebellar  infarct. MRA HEAD: 1. Negative intracranial MRA for large vessel occlusion. 2. Moderate stenoses involving the mid right P2 and distal left P2 segments. 3. Fetal type origin of the PCAs with overall diminutive vertebrobasilar system. Superimposed mild-to-moderate stenosis within the diminutive mid basilar artery. 4. Right vertebral artery terminates in PICA. MRA NECK: Negative MRA of the neck for large vessel occlusion or other vascular abnormality. No hemodynamically significant or correctable stenosis. Electronically Signed   By: Virgia Griffins M.D.   On: 09/05/2023 00:22   MR ANGIO HEAD WO CONTRAST Result Date: 09/05/2023 CLINICAL DATA:  Initial evaluation for acute TIA. EXAM: MRI HEAD WITHOUT CONTRAST MRA HEAD WITHOUT CONTRAST MRA NECK WITHOUT AND WITH CONTRAST TECHNIQUE: Multiplanar, multi-echo pulse sequences of the brain and surrounding structures were acquired without intravenous contrast. Angiographic images of the Circle of Willis were acquired using MRA technique without intravenous contrast. Angiographic images of the neck were acquired using MRA technique without and with intravenous contrast. Carotid stenosis measurements (when applicable) are obtained utilizing NASCET criteria, using the distal internal carotid diameter as the denominator. CONTRAST:  10mL GADAVIST GADOBUTROL 1 MMOL/ML IV SOLN COMPARISON:  CT from earlier the same day. FINDINGS: MRI HEAD FINDINGS Brain: Cerebral volume within normal limits. Mild hazy T2/FLAIR hyperintensity involving the periventricular white matter, most characteristic of chronic microvascular ischemic disease. Few small remote lacunar infarcts noted about the left basal ganglia and right frontal corona radiata. Probable tiny remote  left cerebellar infarct (series 10, image 7). No evidence for acute or subacute infarct. Gray-white matter differentiation maintained. No areas of chronic cortical infarction. No acute intracranial hemorrhage. Few scattered  punctate foci of susceptibility artifact noted, likely related to small parenchymal calcifications as seen on prior CT. Findings likely reflect sequelae of a prior infectious or inflammatory process. No mass lesion, midline shift or mass effect no hydrocephalus or extra-axial fluid collection. Pituitary gland and suprasellar region within normal limits. Vascular: Major intracranial vascular flow voids are maintained. Skull and upper cervical spine: Cranial junction within normal limits. Bone marrow signal intensity normal. No scalp soft tissue abnormality. Sinuses/Orbits: Globes orbital soft tissues within normal limits. Mild mucosal thickening with a few retention cysts noted about the maxillary sinuses. Paranasal sinuses are otherwise clear. No mastoid effusion. Other: None. MRA HEAD FINDINGS Anterior circulation: Both internal carotid arteries are patent to the siphons without stenosis. A1 segments patent bilaterally. Normal anterior communicating artery complex. Anterior cerebral arteries patent without significant stenosis. No M1 stenosis or occlusion. Left M1 bifurcates early. Distal MCA branches perfused and symmetric. Posterior circulation: Left V4 segment widely patent to the vertebrobasilar junction. Right vertebral artery not well visualized on this exam, likely terminating in PICA. Partially visualized PICA are patent. Basilar diminutive with focal mild-to-moderate stenosis at its mid aspect (series 1053, image 6). Superior cerebral arteries patent bilaterally. Fetal type origin of the PCAs bilaterally. Moderate stenoses involving the mid right P2 and distal left P2 segments noted (series 1060, image 5). PCAs remain patent to their distal aspects. Anatomic variants: Fetal type origin of the PCAs with overall diminutive vertebrobasilar system. Right vertebral artery terminates in PICA. No intracranial aneurysm. MRA NECK FINDINGS Aortic arch: Visualized aortic arch within normal limits for caliber with  standard 3 vessel morphology. No stenosis about the origin the great vessels. Right carotid system: Right common and internal carotid arteries are patent with antegrade flow. No evidence for dissection. No hemodynamically significant stenosis about the right carotid artery system. Left carotid system: Left common and internal carotid arteries are patent with antegrade flow. No evidence for dissection. Mild atheromatous irregularity about the left carotid bulb without hemodynamically significant stenosis. Vertebral arteries: Both vertebral arteries arise from subclavian arteries. No visible proximal subclavian artery stenosis. Left vertebral artery slightly dominant. Vertebral arteries patent with antegrade flow. No evidence for dissection. No hemodynamically significant stenosis. Diminutive right vertebral artery terminates in PICA. Other: None IMPRESSION: MRI HEAD: 1. No acute intracranial abnormality. 2. Mild chronic microvascular ischemic disease with a few small remote lacunar infarcts involving the left basal ganglia and right frontal corona radiata. Probable tiny remote left cerebellar infarct. MRA HEAD: 1. Negative intracranial MRA for large vessel occlusion. 2. Moderate stenoses involving the mid right P2 and distal left P2 segments. 3. Fetal type origin of the PCAs with overall diminutive vertebrobasilar system. Superimposed mild-to-moderate stenosis within the diminutive mid basilar artery. 4. Right vertebral artery terminates in PICA. MRA NECK: Negative MRA of the neck for large vessel occlusion or other vascular abnormality. No hemodynamically significant or correctable stenosis. Electronically Signed   By: Virgia Griffins M.D.   On: 09/05/2023 00:22   MR ANGIO NECK W WO CONTRAST Result Date: 09/05/2023 CLINICAL DATA:  Initial evaluation for acute TIA. EXAM: MRI HEAD WITHOUT CONTRAST MRA HEAD WITHOUT CONTRAST MRA NECK WITHOUT AND WITH CONTRAST TECHNIQUE: Multiplanar, multi-echo pulse sequences of  the brain and surrounding structures were acquired without intravenous contrast. Angiographic images of the Circle of Willis were acquired using  MRA technique without intravenous contrast. Angiographic images of the neck were acquired using MRA technique without and with intravenous contrast. Carotid stenosis measurements (when applicable) are obtained utilizing NASCET criteria, using the distal internal carotid diameter as the denominator. CONTRAST:  10mL GADAVIST GADOBUTROL 1 MMOL/ML IV SOLN COMPARISON:  CT from earlier the same day. FINDINGS: MRI HEAD FINDINGS Brain: Cerebral volume within normal limits. Mild hazy T2/FLAIR hyperintensity involving the periventricular white matter, most characteristic of chronic microvascular ischemic disease. Few small remote lacunar infarcts noted about the left basal ganglia and right frontal corona radiata. Probable tiny remote left cerebellar infarct (series 10, image 7). No evidence for acute or subacute infarct. Gray-white matter differentiation maintained. No areas of chronic cortical infarction. No acute intracranial hemorrhage. Few scattered punctate foci of susceptibility artifact noted, likely related to small parenchymal calcifications as seen on prior CT. Findings likely reflect sequelae of a prior infectious or inflammatory process. No mass lesion, midline shift or mass effect no hydrocephalus or extra-axial fluid collection. Pituitary gland and suprasellar region within normal limits. Vascular: Major intracranial vascular flow voids are maintained. Skull and upper cervical spine: Cranial junction within normal limits. Bone marrow signal intensity normal. No scalp soft tissue abnormality. Sinuses/Orbits: Globes orbital soft tissues within normal limits. Mild mucosal thickening with a few retention cysts noted about the maxillary sinuses. Paranasal sinuses are otherwise clear. No mastoid effusion. Other: None. MRA HEAD FINDINGS Anterior circulation: Both internal  carotid arteries are patent to the siphons without stenosis. A1 segments patent bilaterally. Normal anterior communicating artery complex. Anterior cerebral arteries patent without significant stenosis. No M1 stenosis or occlusion. Left M1 bifurcates early. Distal MCA branches perfused and symmetric. Posterior circulation: Left V4 segment widely patent to the vertebrobasilar junction. Right vertebral artery not well visualized on this exam, likely terminating in PICA. Partially visualized PICA are patent. Basilar diminutive with focal mild-to-moderate stenosis at its mid aspect (series 1053, image 6). Superior cerebral arteries patent bilaterally. Fetal type origin of the PCAs bilaterally. Moderate stenoses involving the mid right P2 and distal left P2 segments noted (series 1060, image 5). PCAs remain patent to their distal aspects. Anatomic variants: Fetal type origin of the PCAs with overall diminutive vertebrobasilar system. Right vertebral artery terminates in PICA. No intracranial aneurysm. MRA NECK FINDINGS Aortic arch: Visualized aortic arch within normal limits for caliber with standard 3 vessel morphology. No stenosis about the origin the great vessels. Right carotid system: Right common and internal carotid arteries are patent with antegrade flow. No evidence for dissection. No hemodynamically significant stenosis about the right carotid artery system. Left carotid system: Left common and internal carotid arteries are patent with antegrade flow. No evidence for dissection. Mild atheromatous irregularity about the left carotid bulb without hemodynamically significant stenosis. Vertebral arteries: Both vertebral arteries arise from subclavian arteries. No visible proximal subclavian artery stenosis. Left vertebral artery slightly dominant. Vertebral arteries patent with antegrade flow. No evidence for dissection. No hemodynamically significant stenosis. Diminutive right vertebral artery terminates in PICA.  Other: None IMPRESSION: MRI HEAD: 1. No acute intracranial abnormality. 2. Mild chronic microvascular ischemic disease with a few small remote lacunar infarcts involving the left basal ganglia and right frontal corona radiata. Probable tiny remote left cerebellar infarct. MRA HEAD: 1. Negative intracranial MRA for large vessel occlusion. 2. Moderate stenoses involving the mid right P2 and distal left P2 segments. 3. Fetal type origin of the PCAs with overall diminutive vertebrobasilar system. Superimposed mild-to-moderate stenosis within the diminutive mid basilar artery. 4. Right  vertebral artery terminates in PICA. MRA NECK: Negative MRA of the neck for large vessel occlusion or other vascular abnormality. No hemodynamically significant or correctable stenosis. Electronically Signed   By: Virgia Griffins M.D.   On: 09/05/2023 00:22   CT HEAD WO CONTRAST Result Date: 09/04/2023 CLINICAL DATA:  TIA.  Slurred speech EXAM: CT HEAD WITHOUT CONTRAST TECHNIQUE: Contiguous axial images were obtained from the base of the skull through the vertex without intravenous contrast. RADIATION DOSE REDUCTION: This exam was performed according to the departmental dose-optimization program which includes automated exposure control, adjustment of the mA and/or kV according to patient size and/or use of iterative reconstruction technique. COMPARISON:  CT 05/12/2021, 02/07/2020 FINDINGS: Brain: Again demonstrated lacunar infarctions in the head of the caudate nucleus on the LEFT. Scattered punctate calcifications in the cerebral cortex are not changed over several comparison exams. No CT evidence of acute infarction. No midline shift or mass effect. No extra-axial fluid collections. Vascular: No hyperdense vessel or unexpected calcification. Skull: Normal. Negative for fracture or focal lesion. Sinuses/Orbits: Paranasal sinuses and mastoid air cells are clear. Orbits are clear. Other: None. IMPRESSION: 1. No acute intracranial  findings. 2. Old lacunar infarctions in the head of the caudate nucleus on the LEFT. 3. Scattered punctate calcifications in the cerebral cortex are not changed over several comparison exams. Electronically Signed   By: Deboraha Fallow M.D.   On: 09/04/2023 18:45    Microbiology: Results for orders placed or performed in visit on 03/10/23  Rapid Strep screen([facility])     Status: None   Collection Time: 03/10/23  2:46 PM   Specimen: Other   Other  Result Value Ref Range Status   Strep Gp A Ag, IA W/Reflex Negative Negative Final  Culture, Group A Strep     Status: None   Collection Time: 03/10/23  2:46 PM   Other  Result Value Ref Range Status   Strep A Culture Negative  Final    Comment:                             Reference Range: Negative  Novel Coronavirus, NAA ([facility])     Status: None   Collection Time: 03/10/23  2:53 PM   Specimen: Nasopharyngeal(NP) swabs in vial transport medium  Result Value Ref Range Status   SARS-CoV-2, NAA Not Detected Not Detected Final    Comment: This nucleic acid amplification test was developed and its performance characteristics determined by [employer]. Nucleic acid amplification tests include RT-PCR and TMA. This test has not been FDA cleared or approved. This test has been authorized by FDA under an Emergency Use Authorization (EUA). This test is only authorized for the duration of time the declaration that circumstances exist justifying the authorization of the emergency use of in vitro diagnostic tests for detection of SARS-CoV-2 virus and/or diagnosis of COVID-19 infection under section 564(b)(1) of the Act, 21 U.S.C. 130QMV-7(Q) (1), unless the authorization is terminated or revoked sooner. When diagnostic testing is negative, the possibility of a false negative result should be considered in the context of a patient's recent exposures and the presence of clinical signs and symptoms consistent with COVID-19. An  individual without symptoms of COVID-19 and who is not shedding SARS-CoV-2 virus wo uld expect to have a negative (not detected) result in this assay.     Labs: CBC: Recent Labs  Lab 09/04/23 1726 09/05/23 0242  WBC 10.9* 10.6*  NEUTROABS 7.3  --  HGB 16.3 15.4  HCT 48.6 44.9  MCV 84.8 82.7  PLT 197 195   Basic Metabolic Panel: Recent Labs  Lab 09/04/23 1726  NA 133*  K 3.6  CL 97*  CO2 26  GLUCOSE 231*  BUN 22  CREATININE 1.00  CALCIUM  9.2   Liver Function Tests: Recent Labs  Lab 09/04/23 1726  AST 28  ALT 35  ALKPHOS 79  BILITOT 0.6  PROT 7.8  ALBUMIN 4.1   CBG: Recent Labs  Lab 09/04/23 2141 09/05/23 0022 09/05/23 0821 09/05/23 1133  GLUCAP 216* 319* 264* 272*    Discharge time spent:  36 minutes.  Signed: Ezzard Holms, MD [facility] 09/05/2023

## 2023-09-05 NOTE — Plan of Care (Signed)
  Problem: Ischemic Stroke/TIA Tissue Perfusion: Goal: Complications of ischemic stroke/TIA will be minimized 09/05/2023 0447 by Adah Hollering, RN Outcome: Progressing  Problem: Education: Goal: Knowledge of disease or condition will improve 09/05/2023 0447 by Adah Hollering, RN Outcome: Progressing Goal: Knowledge of secondary prevention will improve  09/05/2023 0447 by Adah Hollering, RN Outcome: Progressing Goal: Knowledge of patient specific risk factors will improve 09/05/2023 0447 by Adah Hollering, RN Outcome: Progressing  Problem: Coping: Goal: Will verbalize positive feelings about self 09/05/2023 0447 by Adah Hollering, RN Outcome: Progressing Goal: Will identify appropriate support needs 09/05/2023 0447 by Adah Hollering, RN Outcome: Progressing  Problem: Health Behavior/Discharge Planning: Goal: Ability to manage health-related needs will improve 09/05/2023 0447 by Adah Hollering, RN Outcome: Progressing Goal: Goals will be collaboratively established with patient/family 09/05/2023 0447 by Adah Hollering, RN Outcome: Progressing  Problem: Self-Care: Goal: Ability to participate in self-care as condition permits will improve 09/05/2023 0447 by Adah Hollering, RN Outcome: Progressing Goal: Verbalization of feelings and concerns over difficulty with self-care will improve 09/05/2023 0447 by Adah Hollering, RN Outcome: Progressing Goal: Ability to communicate needs accurately will improve 09/05/2023 0447 by Adah Hollering, RN Outcome: Progressing  Problem: Nutrition: Goal: Risk of aspiration will decrease 09/05/2023 0447 by Adah Hollering, RN Outcome: Progressing Goal: Dietary intake will improve 09/05/2023 0447 by Adah Hollering, RN Outcome: Progressing

## 2023-09-05 NOTE — Evaluation (Signed)
 Physical Therapy Evaluation Patient Details Name: Rodney Dixon. MRN: [id number] DOB: 1960-11-23 Today's Date: 09/05/2023  History of Present Illness  presented to ER secondary to acute speech changes (now resolved); admitted for TIA/CVA work up.  Imaging negative for acute infarct or large vessel occlusion.  Noted with supratherapeutic INR (managed with hep drip); cleared for participation with evaluation per MD  Clinical Impression  Patient resting in bed upon arrival to room; alert and oriented, follows commands and agreeable to participation with treatment session.  Denies pain; endorses initial symptoms have fully resolved and are now back to baseline.  Bilat UE/LE strength and ROM grossly symmetrical and WFL; no focal weakness, sensory or coordination deficits appreciated.  Able to complete bed mobility with indep; sit/stand, basic transfers and gait (425') without assist device, indep.  Demonstrates reciprocal stepping pattern with good cadence (10' walk time, 5 seconds), mechanics and overall stability; completes dynamic gait components without difficulty, LOB or safety concern    Appears to be at functional baseline (patient in agreement); no acute PT needs identified.  Will complete initial PT order; please re-consult should needs change.    If plan is discharge home, recommend the following:     Can travel by private vehicle        Equipment Recommendations    Recommendations for Other Services       Functional Status Assessment Patient has not had a recent decline in their functional status     Precautions / Restrictions Precautions Precautions: None Restrictions Weight Bearing Restrictions Per Provider Order: No      Mobility  Bed Mobility Overal bed mobility: Independent                  Transfers Overall transfer level: Independent Equipment used: None                    Ambulation/Gait Ambulation/Gait assistance: Independent Gait Distance  (Feet): 425 Feet Assistive device: None   Gait velocity: 10' walk time, 5 seconds     General Gait Details: reciprocal stepping pattern with gootd cadence, mechanics and overall stability; completes dynamic gait components without difficulty, LOB or safety concern  Stairs            Wheelchair Mobility     Tilt Bed    Modified Rankin (Stroke Patients Only)       Balance Overall balance assessment: Needs assistance Sitting-balance support: No upper extremity supported, Feet supported Sitting balance-Leahy Scale: Normal     Standing balance support: No upper extremity supported Standing balance-Leahy Scale: Normal                               Pertinent Vitals/Pain Pain Assessment Pain Assessment: No/denies pain    Home Living Family/patient expects to be discharged to:: Private residence Living Arrangements: Spouse/significant other Available Help at Discharge: Family Type of Home: House Home Access: Stairs to enter   Secretary/administrator of Steps: 3-4   Home Layout: One level Home Equipment: None      Prior Function Prior Level of Function : Independent/Modified Independent;Driving                     Extremity/Trunk Assessment   Upper Extremity Assessment Upper Extremity Assessment: Overall WFL for tasks assessed    Lower Extremity Assessment Lower Extremity Assessment: Overall WFL for tasks assessed (grossly 5/5 throughout; no focal weakness, sensory or coordination deficits  appreciated)       Communication        Cognition Arousal: Alert Behavior During Therapy: WFL for tasks assessed/performed   PT - Cognitive impairments: No apparent impairments                                 Cueing       General Comments      Exercises     Assessment/Plan    PT Assessment Patient does not need any further PT services  PT Problem List         PT Treatment Interventions      PT Goals (Current goals  can be found in the Care Plan section)  Acute Rehab PT Goals Patient Stated Goal: to go home PT Goal Formulation: All assessment and education complete, DC therapy Time For Goal Achievement: 09/05/23 Potential to Achieve Goals: Good    Frequency       Co-evaluation               AM-PAC PT "6 Clicks" Mobility  Outcome Measure Help needed turning from your back to your side while in a flat bed without using bedrails?: None Help needed moving from lying on your back to sitting on the side of a flat bed without using bedrails?: None Help needed moving to and from a bed to a chair (including a wheelchair)?: None Help needed standing up from a chair using your arms (e.g., wheelchair or bedside chair)?: None Help needed to walk in hospital room?: None Help needed climbing 3-5 steps with a railing? : None 6 Click Score: 24    End of Session   Activity Tolerance: Patient tolerated treatment well Patient left: in bed;with call bell/phone within reach   PT Visit Diagnosis: Other symptoms and signs involving the nervous system (R29.898)    Time: 1308-6578 PT Time Calculation (min) (ACUTE ONLY): 10 min   Charges:       PT General Charges $$ ACUTE PT VISIT: 1 Visit       Constantine Ruddick H. Bevin Bucks, PT, DPT, NCS 09/05/23, 2:25 PM [phone]

## 2023-09-05 NOTE — Plan of Care (Signed)

## 2023-09-05 NOTE — Progress Notes (Addendum)
 OT Cancellation Note  Patient Details Name: Rodney PORZIO [name] MRN: [id number] DOB: Feb 11, 1961   Cancelled Treatment:    Reason Eval/Treat Not Completed: OT screened, no needs identified, will sign off (per discussion with PT, no OT needs at this time. Please re consult if there is a change in functional status.)  Gerre Kraft, OTD OTR/L  09/05/23, 12:37 PM

## 2023-09-05 NOTE — Inpatient Diabetes Management (Signed)
 Inpatient Diabetes Program Recommendations  AACE/ADA: New Consensus Statement on Inpatient Glycemic Control (2015)  Target Ranges:  Prepandial:   less than 140 mg/dL      Peak postprandial:   less than 180 mg/dL (1-2 hours)      Critically ill patients:  140 - 180 mg/dL   Lab Results  Component Value Date   GLUCAP 272 (H) 09/05/2023   HGBA1C 12.3 (H) 09/04/2023    Review of Glycemic Control  Latest Reference Range & Units 09/05/23 00:22 09/05/23 08:21 09/05/23 11:33  Glucose-Capillary 70 - 99 mg/dL 161 (H) 096 (H) 045 (H)  (H): Data is abnormally high Diabetes history: Type 2 DM Outpatient Diabetes medications: Jardiance  25 mg every day, Fiasp  5-16 units TID, Basaglar  60 units QHS Current orders for Inpatient glycemic control: Semglee  60 units every day, Novolog  0-20 units TID & HS, Jardiance  25 mg QD  Inpatient Diabetes Program Recommendations:    Spoke with patient and wife regarding outpatient diabetes management. Verified home medications above and patient reports that he is only really taking the long acting insulin .  Reports that these medications were added within the last two months and he has been trying to remember to take them. Reviewed patient's current A1c of 12.3%. Explained what a A1c is and what it measures. Also reviewed goal A1c with patient, importance of good glucose control @ home, and blood sugar goals. Reviewed patho of DM, need for insulin , differences between short acting vs long acting insulin , survival skills, interventions, vascular changes, impact of TIA vs stroke, and other commorbidities.  Patient has a meter, however, was not using to track blood sugars. Reviewed frequency of when to check CBGs and when to follow up with PCP. Also, discussed CGM. Patient reports using "a while ago" but current phone does not support use. Will need a reader. Admits to drinking apple juice to "help me go to the bathroom". Discussed the importance of alternatives and to discuss  further with provider as there are other ways around this challenge beside juice. Reviewed importance of protein and importance of CHO mindfulness. Dietitian consult placed and outpatient RPH.   At discharge patient needs [facility] 3 reader order  Thanks, Marjo Sievert, MSN, RNC-OB Diabetes Coordinator [phone] (8a-5p)

## 2023-09-06 ENCOUNTER — Telehealth: Payer: Self-pay

## 2023-09-06 ENCOUNTER — Other Ambulatory Visit: Payer: Self-pay | Admitting: Nurse Practitioner

## 2023-09-06 DIAGNOSIS — E1142 Type 2 diabetes mellitus with diabetic polyneuropathy: Secondary | ICD-10-CM

## 2023-09-06 LAB — ECHOCARDIOGRAM COMPLETE
AR max vel: 1.24 cm2
AV Area VTI: 1.28 cm2
AV Area mean vel: 1.29 cm2
AV Mean grad: 14 mmHg
AV Peak grad: 26.3 mmHg
Ao pk vel: 2.56 m/s
Area-P 1/2: 3.34 cm2
Height: 68 in
MV VTI: 2.82 cm2
S' Lateral: 2.6 cm
Weight: 4169.34 [oz_av]

## 2023-09-06 MED ORDER — FREESTYLE LIBRE 3 READER DEVI: 1.0000 | Freq: Every day | 0 refills | Status: DC

## 2023-09-06 NOTE — Telephone Encounter (Signed)
Prescription t'd up

## 2023-09-06 NOTE — Progress Notes (Signed)
   09/06/2023  Patient ID: Rodney Coombs [name], male   DOB: 1960-11-21, 63 y.o.   MRN: [id number]  Missed call/voicemail from patient this morning while I was on another patient appointment.  Attempted to call patient x2, but phone went directly to voicemail, which has not been set up.  Attempted to call wife x2, Diane (DPR), but phone went directly to voicemail, which was full.  Patient sees PCP tomorrow, so informing her and asking to send any medication related needs identified during visit to me.  Linn Rich, PharmD, DPLA

## 2023-09-06 NOTE — Telephone Encounter (Signed)
 Patient's wife came requesting a prescription be sent over for [facility] 3 Reader to [facility] [address]. Patient is in the hospital and received the sensor. Please advise.  [phone]

## 2023-09-07 ENCOUNTER — Ambulatory Visit (INDEPENDENT_AMBULATORY_CARE_PROVIDER_SITE_OTHER): Admitting: Nurse Practitioner

## 2023-09-07 ENCOUNTER — Encounter: Payer: Self-pay | Admitting: Nurse Practitioner

## 2023-09-07 VITALS — BP 157/88 | HR 75 | Temp 98.5°F | Ht 65.0 in | Wt 254.8 lb

## 2023-09-07 DIAGNOSIS — E119 Type 2 diabetes mellitus without complications: Secondary | ICD-10-CM

## 2023-09-07 DIAGNOSIS — E66813 Obesity, class 3: Secondary | ICD-10-CM

## 2023-09-07 DIAGNOSIS — E1142 Type 2 diabetes mellitus with diabetic polyneuropathy: Secondary | ICD-10-CM

## 2023-09-07 DIAGNOSIS — Z952 Presence of prosthetic heart valve: Secondary | ICD-10-CM

## 2023-09-07 DIAGNOSIS — R809 Proteinuria, unspecified: Secondary | ICD-10-CM

## 2023-09-07 DIAGNOSIS — E1129 Type 2 diabetes mellitus with other diabetic kidney complication: Secondary | ICD-10-CM | POA: Diagnosis not present

## 2023-09-07 DIAGNOSIS — Z7901 Long term (current) use of anticoagulants: Secondary | ICD-10-CM

## 2023-09-07 DIAGNOSIS — E1159 Type 2 diabetes mellitus with other circulatory complications: Secondary | ICD-10-CM

## 2023-09-07 DIAGNOSIS — E1165 Type 2 diabetes mellitus with hyperglycemia: Secondary | ICD-10-CM | POA: Diagnosis not present

## 2023-09-07 DIAGNOSIS — I482 Chronic atrial fibrillation, unspecified: Secondary | ICD-10-CM

## 2023-09-07 DIAGNOSIS — E1169 Type 2 diabetes mellitus with other specified complication: Secondary | ICD-10-CM

## 2023-09-07 DIAGNOSIS — Z794 Long term (current) use of insulin: Secondary | ICD-10-CM

## 2023-09-07 LAB — COAGUCHEK XS/INR WAIVED
INR: 1.9 — ABNORMAL HIGH (ref 0.9–1.1)
Prothrombin Time: 23.4 s

## 2023-09-07 MED ORDER — BENAZEPRIL HCL 40 MG PO TABS: 40.0000 mg | ORAL_TABLET | Freq: Every day | ORAL | 4 refills | Status: AC

## 2023-09-07 MED ORDER — GABAPENTIN 600 MG PO TABS: ORAL_TABLET | ORAL | 4 refills | Status: AC

## 2023-09-07 NOTE — Assessment & Plan Note (Signed)
 Chronic, ongoing.  Poorly controlled due to non adherence and poor diet.  A1c recent 12.3%.  Will place new endocrinology referral and see if can get him back into [facility], since would prefer local.  Cannot take Metformin  as caused reduced kidney function. Cannot take Glipizide due to risk for interaction with Coumadin . For now continue current regimen + collaboration with Bartholomew Light PharmD.  Jardiance  offered benefit to BP, but cannot afford. Discussed at length his very high risk with poor diabetes control - we have had multiple discussions on this.   - Refuses vaccinations - ACE and statin on board - Needs eye exam.  Foot exam up to date.

## 2023-09-07 NOTE — Progress Notes (Signed)
 BP (!) 157/88   Pulse 75   Temp 98.5 F (36.9 C) (Oral)   Ht 5\' 5"  (1.651 m)   Wt 254 lb 12.8 oz (115.6 kg)   SpO2 98%   BMI 42.40 kg/m    Subjective:    Patient ID: Rodney Coombs [name], male    DOB: 1960/07/15, 63 y.o.   MRN: [id number]  HPI: Rodney Vandervelden. is a 63 y.o. male  Chief Complaint  Patient presents with   Hospitalization Follow-up   Transition of Care Hospital Follow up.  Was admitted to hospital on 09/04/23 and discharged 09/05/23 for TIA event and hyperglycemia.  INR while in hospital was 1.4.  Was discharged home on Lovenox  and Coumadin  until INR in goal range for mechanical valve.  Since he has been home his sugars have not been above 160 two hours after meals. Is missing Benazepril  in medications today, has not been taking.  He denies any further difficulty with speech.  Overall reports symptoms have been improved.  Current insulin  dosing: Basaglar  60 units and Fiasp  before meals 14 to 16 units. In past tried to get Ozempic  but this has been denied and cannot get via assistance. Not taking Jardiance  at present.  Has a Freestyle ordered but has not picked up yet.  Has gone to various endocrinologists, two of them left. [facility] discharged him years ago, but is becoming more adherent now.   "Hospital Course: Rodney Dixon. is a 63 y.o. male with medical history significant for Insulin -dependent type 2 diabetes, HTN, OSA, morbid obesity, mechanical aortic valve since 2006 on Coumadin , paroxysmal A-fib and prior CVA on past imaging, being admitted for stroke workup.   EKG, Personally reviewed and interpreted showing NSR at 77 with no concerning ST-T wave changes CT head showed old lacunar infarcts, stable punctate calcifications but no acute intracranial findings. MRI of the brain did not show any acute pathology. Patient has been seen by neurologist and has been cleared for discharge today.  Patient has agreed to go home with Lovenox  as well as warfarin  bridge.  Has been taught how to do it and he tells me as well as nursing staff that he is comfortable.  He has a Coumadin  appointment upcoming."   Hospital/Facility: [facility] D/C Physician: Dr. Mariella Shore D/C Date: 09/05/23  Records Requested: 09/07/23 Records Received: 09/07/23 Records Reviewed: 09/07/23  Diagnoses on Discharge: TIA with history of stroke  Date of interactive Contact within 48 hours of discharge:  Contact was through: none present  Date of 7 day or 14 day face-to-face visit:    within 7 days  Outpatient Encounter Medications as of 09/07/2023  Medication Sig Note   Accu-Chek Softclix Lancets lancets Use to check blood fasting blood glucose daily    albuterol  (VENTOLIN  HFA) 108 (90 Base) MCG/ACT inhaler TAKE 2 PUFFS BY MOUTH EVERY 6 HOURS AS NEEDED    amLODipine  (NORVASC ) 5 MG tablet Take 1 tablet (5 mg total) by mouth daily.    atorvastatin  (LIPITOR) 80 MG tablet Take 1 tablet (80 mg total) by mouth daily.    Blood Glucose Monitoring Suppl (ACCU-CHEK AVIVA PLUS) w/Device KIT Use to check fasting blood glucose daily    carvedilol  (COREG ) 25 MG tablet TAKE 1 TABLET (25 MG TOTAL) BY MOUTH TWICE A DAY WITH MEALS    Continuous Glucose Receiver (FREESTYLE LIBRE 3 READER) DEVI 1 each by Does not apply route daily at 2 PM.    enoxaparin  (LOVENOX ) 120 MG/0.8ML injection  Inject 0.8 mLs (120 mg total) into the skin every 12 (twelve) hours for 5 days.    glucose blood (ACCU-CHEK AVIVA PLUS) test strip Use to check blood fasting blood glucose daily    hydrochlorothiazide  (HYDRODIURIL ) 25 MG tablet Take 25 mg by mouth daily.    insulin  aspart (FIASP  FLEXTOUCH) 100 UNIT/ML FlexTouch Pen Inject 5 Units into the skin with breakfast, with lunch, and with evening meal. Please apply manufacturer copay card:bin [id number], pcn cnrx, id [id number], grp [id number] (Patient taking differently: Inject 14 Units into the skin with breakfast, with lunch, and with evening meal. Please apply manufacturer copay card:bin  [id number], pcn cnrx, id [id number], grp [id number])    Insulin  Glargine (BASAGLAR  KWIKPEN) 100 UNIT/ML INJECT 60 UNITS INTO THE SKIN DAILY    Insulin  Pen Needle 32G X 4 MM MISC Use to inject insulin  4 times daily    warfarin (COUMADIN ) 5 MG tablet Take 2 tablets (10 mg total) by mouth daily for 10 days.    [DISCONTINUED] gabapentin  (NEURONTIN ) 600 MG tablet Take 600 MG (one tablet) by mouth in morning, then 600 MG (one tablet) by mouth at lunch, and then 1200 MG (two tablets) by mouth at night before bedtime. (Patient taking differently: Take 600 mg by mouth at bedtime. Take 600 MG at night)    benazepril  (LOTENSIN ) 40 MG tablet Take 1 tablet (40 mg total) by mouth daily.    gabapentin  (NEURONTIN ) 600 MG tablet Take 600 MG (one tablet) by mouth in morning, then 600 MG (one tablet) by mouth at lunch, and then 1200 MG (two tablets) by mouth at night before bedtime.    JARDIANCE  25 MG TABS tablet Take 25 mg by mouth every morning. (Patient not taking: Reported on 09/07/2023) 09/04/2023: Pt only taking when able to afford (with coupon)   sildenafil  (VIAGRA ) 100 MG tablet Take 0.5-1 tablets (50-100 mg total) by mouth daily as needed for erectile dysfunction. (Patient not taking: Reported on 09/07/2023)    [DISCONTINUED] benazepril  (LOTENSIN ) 40 MG tablet Take 1 tablet (40 mg total) by mouth daily. (Patient not taking: Reported on 09/07/2023)    No facility-administered encounter medications on file as of 09/07/2023.    Diagnostic Tests Reviewed/Disposition:     Latest Ref Rng & Units 09/05/2023    2:42 AM 09/04/2023    5:26 PM 04/28/2023    2:20 PM  CBC  WBC 4.0 - 10.5 K/uL 10.6  10.9  8.8   Hemoglobin 13.0 - 17.0 g/dL 87.5  64.3  32.9   Hematocrit 39.0 - 52.0 % 44.9  48.6  44.3   Platelets 150 - 400 K/uL 195  197  223     CMP     Component Value Date/Time   NA 133 (L) 09/04/2023 1726   NA 139 04/28/2023 1420   K 3.6 09/04/2023 1726   CL 97 (L) 09/04/2023 1726   CO2 26 09/04/2023 1726   GLUCOSE 231  (H) 09/04/2023 1726   BUN 22 09/04/2023 1726   BUN 16 04/28/2023 1420   CREATININE 1.00 09/04/2023 1726   CALCIUM  9.2 09/04/2023 1726   PROT 7.8 09/04/2023 1726   PROT 7.2 04/28/2023 1420   ALBUMIN 4.1 09/04/2023 1726   ALBUMIN 4.3 04/28/2023 1420   AST 28 09/04/2023 1726   ALT 35 09/04/2023 1726   ALKPHOS 79 09/04/2023 1726   BILITOT 0.6 09/04/2023 1726   BILITOT 0.4 04/28/2023 1420   EGFR 82 04/28/2023 1420   GFRNONAA >60 09/04/2023 1726    Consults:  neurology  Discharge Instructions: Follow-up with PCP and Coumadin  Clinic  Disease/illness Education: Reviewed at length with patient today  Home Health/Community Services Discussions/Referrals: none  Establishment or re-establishment of referral orders for community resources: none  Discussion with other health care providers: Reviewed all recent notes  Assessment and Support of treatment regimen adherence: Reviewed at length with patient today  Appointments Coordinated with: Reviewed at length with patient today  Education for self-management, independent living, and ADLs:  Reviewed at length with patient today  Relevant past medical, surgical, family and social history reviewed and updated as indicated. Interim medical history since our last visit reviewed. Allergies and medications reviewed and updated.  Review of Systems  Constitutional:  Negative for activity change, diaphoresis, fatigue and fever.  Respiratory:  Negative for cough, chest tightness, shortness of breath and wheezing.   Cardiovascular:  Negative for chest pain, palpitations and leg swelling.  Gastrointestinal: Negative.   Endocrine: Negative for polydipsia, polyphagia and polyuria.  Neurological:  Negative for dizziness, syncope, facial asymmetry, speech difficulty, weakness, light-headedness and headaches.  Psychiatric/Behavioral: Negative.      Per HPI unless specifically indicated above     Objective:    BP (!) 157/88   Pulse 75   Temp 98.5  F (36.9 C) (Oral)   Ht 5\' 5"  (1.651 m)   Wt 254 lb 12.8 oz (115.6 kg)   SpO2 98%   BMI 42.40 kg/m   Wt Readings from Last 3 Encounters:  09/07/23 254 lb 12.8 oz (115.6 kg)  09/05/23 260 lb 9.3 oz (118.2 kg)  06/26/23 252 lb 12.8 oz (114.7 kg)    Physical Exam Vitals and nursing note reviewed.  Constitutional:      General: He is awake. He is not in acute distress.    Appearance: He is well-developed and well-groomed. He is morbidly obese. He is not ill-appearing.  HENT:     Head: Normocephalic and atraumatic.     Right Ear: Hearing normal. No drainage.     Left Ear: Hearing normal. No drainage.  Eyes:     General: Lids are normal.        Right eye: No discharge.        Left eye: No discharge.     Conjunctiva/sclera: Conjunctivae normal.     Pupils: Pupils are equal, round, and reactive to light.  Neck:     Thyroid : No thyromegaly.     Vascular: No carotid bruit or JVD.  Cardiovascular:     Rate and Rhythm: Normal rate and regular rhythm.     Heart sounds: Normal heart sounds, S1 normal and S2 normal. No murmur heard.    No gallop.  Pulmonary:     Effort: Pulmonary effort is normal. No accessory muscle usage or respiratory distress.     Breath sounds: Normal breath sounds.  Abdominal:     General: Bowel sounds are normal.     Palpations: Abdomen is soft.  Musculoskeletal:        General: Normal range of motion.     Right shoulder: Normal.     Left shoulder: Normal.     Cervical back: Normal range of motion and neck supple.     Right lower leg: No edema.     Left lower leg: No edema.  Skin:    General: Skin is warm and dry.     Capillary Refill: Capillary refill takes less than 2 seconds.     Findings: No rash.  Neurological:     Mental  Status: He is alert and oriented to person, place, and time.     Cranial Nerves: Cranial nerves 2-12 are intact.     Motor: Motor function is intact.     Coordination: Coordination is intact.     Gait: Gait is intact.     Deep  Tendon Reflexes: Reflexes are normal and symmetric.     Comments: Overall no slurred speech or difficulty word finding.  Psychiatric:        Attention and Perception: Attention normal.        Mood and Affect: Mood normal.        Speech: Speech normal.        Behavior: Behavior normal. Behavior is cooperative.        Thought Content: Thought content normal.     Results for orders placed or performed during the hospital encounter of 09/04/23  Protime-INR   Collection Time: 09/04/23  5:26 PM  Result Value Ref Range   Prothrombin Time 17.0 (H) 11.4 - 15.2 seconds   INR 1.4 (H) 0.8 - 1.2  APTT   Collection Time: 09/04/23  5:26 PM  Result Value Ref Range   aPTT 31 24 - 36 seconds  CBC   Collection Time: 09/04/23  5:26 PM  Result Value Ref Range   WBC 10.9 (H) 4.0 - 10.5 K/uL   RBC 5.73 4.22 - 5.81 MIL/uL   Hemoglobin 16.3 13.0 - 17.0 g/dL   HCT 16.1 09.6 - 04.5 %   MCV 84.8 80.0 - 100.0 fL   MCH 28.4 26.0 - 34.0 pg   MCHC 33.5 30.0 - 36.0 g/dL   RDW 40.9 81.1 - 91.4 %   Platelets 197 150 - 400 K/uL   nRBC 0.0 0.0 - 0.2 %  Differential   Collection Time: 09/04/23  5:26 PM  Result Value Ref Range   Neutrophils Relative % 66 %   Neutro Abs 7.3 1.7 - 7.7 K/uL   Lymphocytes Relative 21 %   Lymphs Abs 2.2 0.7 - 4.0 K/uL   Monocytes Relative 6 %   Monocytes Absolute 0.7 0.1 - 1.0 K/uL   Eosinophils Relative 5 %   Eosinophils Absolute 0.5 0.0 - 0.5 K/uL   Basophils Relative 1 %   Basophils Absolute 0.1 0.0 - 0.1 K/uL   Immature Granulocytes 1 %   Abs Immature Granulocytes 0.05 0.00 - 0.07 K/uL  Comprehensive metabolic panel   Collection Time: 09/04/23  5:26 PM  Result Value Ref Range   Sodium 133 (L) 135 - 145 mmol/L   Potassium 3.6 3.5 - 5.1 mmol/L   Chloride 97 (L) 98 - 111 mmol/L   CO2 26 22 - 32 mmol/L   Glucose, Bld 231 (H) 70 - 99 mg/dL   BUN 22 8 - 23 mg/dL   Creatinine, Ser 7.82 0.61 - 1.24 mg/dL   Calcium  9.2 8.9 - 10.3 mg/dL   Total Protein 7.8 6.5 - 8.1 g/dL    Albumin 4.1 3.5 - 5.0 g/dL   AST 28 15 - 41 U/L   ALT 35 0 - 44 U/L   Alkaline Phosphatase 79 38 - 126 U/L   Total Bilirubin 0.6 0.0 - 1.2 mg/dL   GFR, Estimated >95 >62 mL/min   Anion gap 10 5 - 15  Ethanol   Collection Time: 09/04/23  5:26 PM  Result Value Ref Range   Alcohol, Ethyl (B) <15 <15 mg/dL  Hemoglobin Z3Y   Collection Time: 09/04/23  5:26 PM  Result Value Ref  Range   Hgb A1c MFr Bld 12.3 (H) 4.8 - 5.6 %   Mean Plasma Glucose 306.31 mg/dL  CBG monitoring, ED   Collection Time: 09/04/23  9:41 PM  Result Value Ref Range   Glucose-Capillary 216 (H) 70 - 99 mg/dL  Glucose, capillary   Collection Time: 09/05/23 12:22 AM  Result Value Ref Range   Glucose-Capillary 319 (H) 70 - 99 mg/dL  Protime-INR   Collection Time: 09/05/23  2:42 AM  Result Value Ref Range   Prothrombin Time 17.0 (H) 11.4 - 15.2 seconds   INR 1.4 (H) 0.8 - 1.2  CBC   Collection Time: 09/05/23  2:42 AM  Result Value Ref Range   WBC 10.6 (H) 4.0 - 10.5 K/uL   RBC 5.43 4.22 - 5.81 MIL/uL   Hemoglobin 15.4 13.0 - 17.0 g/dL   HCT 46.9 62.9 - 52.8 %   MCV 82.7 80.0 - 100.0 fL   MCH 28.4 26.0 - 34.0 pg   MCHC 34.3 30.0 - 36.0 g/dL   RDW 41.3 24.4 - 01.0 %   Platelets 195 150 - 400 K/uL   nRBC 0.0 0.0 - 0.2 %  Heparin  level (unfractionated)   Collection Time: 09/05/23  2:42 AM  Result Value Ref Range   Heparin  Unfractionated <0.10 (L) 0.30 - 0.70 IU/mL  Lipid panel   Collection Time: 09/05/23  2:42 AM  Result Value Ref Range   Cholesterol 116 0 - 200 mg/dL   Triglycerides 272 (H) <150 mg/dL   HDL 24 (L) >53 mg/dL   Total CHOL/HDL Ratio 4.8 RATIO   VLDL 56 (H) 0 - 40 mg/dL   LDL Cholesterol 36 0 - 99 mg/dL  HIV Antibody (routine testing w rflx)   Collection Time: 09/05/23  2:42 AM  Result Value Ref Range   HIV Screen 4th Generation wRfx Non Reactive Non Reactive  Glucose, capillary   Collection Time: 09/05/23  8:21 AM  Result Value Ref Range   Glucose-Capillary 264 (H) 70 - 99 mg/dL   Urinalysis, Complete w Microscopic -Urine, Clean Catch   Collection Time: 09/05/23 10:10 AM  Result Value Ref Range   Color, Urine YELLOW (A) YELLOW   APPearance CLEAR (A) CLEAR   Specific Gravity, Urine 1.016 1.005 - 1.030   pH 6.0 5.0 - 8.0   Glucose, UA >=500 (A) NEGATIVE mg/dL   Hgb urine dipstick NEGATIVE NEGATIVE   Bilirubin Urine NEGATIVE NEGATIVE   Ketones, ur NEGATIVE NEGATIVE mg/dL   Protein, ur 30 (A) NEGATIVE mg/dL   Nitrite NEGATIVE NEGATIVE   Leukocytes,Ua NEGATIVE NEGATIVE   RBC / HPF 0-5 0 - 5 RBC/hpf   WBC, UA 0 0 - 5 WBC/hpf   Bacteria, UA NONE SEEN NONE SEEN   Squamous Epithelial / HPF 0 0 - 5 /HPF  Urine Drug Screen, Qualitative ([facility] only)   Collection Time: 09/05/23 10:10 AM  Result Value Ref Range   Tricyclic, Ur Screen NONE DETECTED NONE DETECTED   Amphetamines, Ur Screen NONE DETECTED NONE DETECTED   MDMA (Ecstasy)Ur Screen NONE DETECTED NONE DETECTED   Cocaine Metabolite,Ur [place] NONE DETECTED NONE DETECTED   Opiate, Ur Screen NONE DETECTED NONE DETECTED   Phencyclidine (PCP) Ur S NONE DETECTED NONE DETECTED   Cannabinoid 50 Ng, Ur Industry NONE DETECTED NONE DETECTED   Barbiturates, Ur Screen NONE DETECTED NONE DETECTED   Benzodiazepine, Ur Scrn NONE DETECTED NONE DETECTED   Methadone Scn, Ur NONE DETECTED NONE DETECTED  Heparin  level (unfractionated)   Collection Time: 09/05/23  10:53 AM  Result Value Ref Range   Heparin  Unfractionated 0.13 (L) 0.30 - 0.70 IU/mL  Glucose, capillary   Collection Time: 09/05/23 11:33 AM  Result Value Ref Range   Glucose-Capillary 272 (H) 70 - 99 mg/dL  ECHOCARDIOGRAM COMPLETE   Collection Time: 09/05/23  1:59 PM  Result Value Ref Range   Weight 4,169.34 oz   Height 68 in   BP 149/78 mmHg   Ao pk vel 2.56 m/s   AV Area VTI 1.28 cm2   AR max vel 1.24 cm2   AV Mean grad 14.0 mmHg   AV Peak grad 26.3 mmHg   S' Lateral 2.60 cm   AV Area mean vel 1.29 cm2   Area-P 1/2 3.34 cm2   MV VTI 2.82 cm2   Est EF 60 - 65%    Glucose, capillary   Collection Time: 09/05/23  4:51 PM  Result Value Ref Range   Glucose-Capillary 118 (H) 70 - 99 mg/dL      Assessment & Plan:   Problem List Items Addressed This Visit       Cardiovascular and Mediastinum   Chronic atrial fibrillation (HCC) (Chronic)   Chronic, ongoing with Coumadin  use.  Continue collaboration with cardiology, last note reviewed.  Continue all medications with plan to discontinue Lovenox  per bridge once INR stable.      Relevant Medications   hydrochlorothiazide  (HYDRODIURIL ) 25 MG tablet   benazepril  (LOTENSIN ) 40 MG tablet   Hypertension associated with diabetes (HCC)   Chronic, ongoing.  BP above goal today, but has not been taking Benazepril .  Restart Benazepril  and continue HCTZ, Coreg  + Amlodipine , educated him on these medications and use + side effects.  Highly recommend he monitor BP at home at least a few mornings a week + focus on DASH diet.  Discussed with him stroke prevention goal for BP <130/80.  LABS: CMP.  Continue cardiology collaboration.  Continue to collaborate with [facility] PharmD.        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  Poorly controlled due to non adherence and poor diet.  A1c recent 12.3%.  Will place new endocrinology referral and see if can get him back into [facility], since would prefer local.  Cannot take Metformin  as caused reduced kidney function. Cannot take Glipizide due to risk for interaction with Coumadin . For now continue current regimen + collaboration with Bartholomew Light PharmD.  Jardiance  offered benefit to BP, but cannot afford. Discussed at length his very high risk with poor diabetes control - we have had multiple discussions on this.   - Refuses vaccinations - ACE and statin on board - Needs eye exam.  Foot exam up to date.      Relevant  Medications   benazepril  (LOTENSIN ) 40 MG tablet   Other Relevant Orders   Ambulatory referral to Endocrinology   Type 2 diabetes mellitus with peripheral neuropathy (HCC) - Primary (Chronic)   Refer to diabetes with CKD plan of care.  Continue Gabapentin  and renal dose as needed in future.      Relevant Medications   benazepril  (LOTENSIN ) 40 MG tablet   gabapentin  (NEURONTIN ) 600 MG tablet   Other Relevant Orders   Comprehensive metabolic panel with GFR   Lipid Panel w/o Chol/HDL Ratio   Ambulatory referral to Endocrinology   Hyperlipidemia associated with type 2  diabetes mellitus (HCC) (Chronic)   Chronic, ongoing.  Continue current medication regimen and adjust as needed.  Lipid panel today.      Relevant Medications   hydrochlorothiazide  (HYDRODIURIL ) 25 MG tablet   benazepril  (LOTENSIN ) 40 MG tablet   Uncontrolled type 2 diabetes mellitus with hyperglycemia, with long-term current use of insulin  (HCC)   Chronic, ongoing.  Poorly controlled due to non adherence and poor diet.  A1c recent 12.3%.  Will place new endocrinology referral and see if can get him back into [facility], since would prefer local.  Cannot take Metformin  as caused reduced kidney function. Cannot take Glipizide due to risk for interaction with Coumadin . For now continue current regimen + collaboration with Bartholomew Light PharmD.  Jardiance  offered benefit to BP, but cannot afford. Discussed at length his very high risk with poor diabetes control - we have had multiple discussions on this.   - Refuses vaccinations - ACE and statin on board - Needs eye exam.  Foot exam up to date.      Relevant Medications   benazepril  (LOTENSIN ) 40 MG tablet   Insulin  dependent type 2 diabetes mellitus (HCC)   Refer to uncontrolled diabetes plan of care.      Relevant Medications   benazepril  (LOTENSIN ) 40 MG tablet     Other   History of mechanical aortic valve replacement (Chronic)   Chronic, ongoing with Coumadin  use.  INR 1.9 today, trending up with bridge.  Continue Coumadin  10 MG daily.  Not to take Ibuprofen due to risk for bleeding with this and only use Tylenol  or OTC Voltaren gel or Icy/Hot patches.  Remain off Fenofibrate . Plan to discontinue Lovenox  per bridge once INR stable.      Relevant Orders   Ambulatory referral to Anticoagulation Monitoring   Current use of long term anticoagulation (Chronic)   Refer to mechanical valve plan of care.      Relevant Orders   CoaguChek XS/INR Waived   Ambulatory referral to Anticoagulation Monitoring   Obesity, Class III, BMI 40-49.9 (morbid obesity) (HCC)   BMI 42.40 with T2DM, HTN/HLD.  Recommended eating smaller high protein, low fat meals more frequently and exercising 30 mins a day 5 times a week with a goal of 10-15lb weight loss in the next 3 months. Patient voiced their understanding and motivation to adhere to these recommendations.         Follow up plan: Return in about 4 days (around 09/11/2023) for INR Check.

## 2023-09-07 NOTE — Assessment & Plan Note (Signed)
 Chronic, ongoing with Coumadin  use. INR 1.9 today, trending up with bridge.  Continue Coumadin  10 MG daily.  Not to take Ibuprofen due to risk for bleeding with this and only use Tylenol  or OTC Voltaren gel or Icy/Hot patches.  Remain off Fenofibrate . Plan to discontinue Lovenox  per bridge once INR stable.

## 2023-09-07 NOTE — Assessment & Plan Note (Signed)
Refer to uncontrolled diabetes plan of care.

## 2023-09-07 NOTE — Assessment & Plan Note (Signed)
 Chronic, ongoing with Coumadin  use.  Continue collaboration with cardiology, last note reviewed.  Continue all medications with plan to discontinue Lovenox  per bridge once INR stable.

## 2023-09-07 NOTE — Assessment & Plan Note (Signed)
Refer to diabetes with CKD plan of care.  Continue Gabapentin and renal dose as needed in future.

## 2023-09-07 NOTE — Assessment & Plan Note (Signed)
 Chronic, ongoing.  Poorly controlled due to non adherence and poor diet.  A1c recent 12.3%.  Will place new endocrinology referral and see if can get him back into [facility], since would prefer local.  Cannot take Metformin  as caused reduced kidney function. Cannot take Glipizide due to risk for interaction with Coumadin . For now continue current regimen + collaboration with Rodney Dixon PharmD.  Jardiance  offered benefit to BP, but cannot afford. Discussed at length his very high risk with poor diabetes control - we have had multiple discussions on this.   - Refuses vaccinations - ACE and statin on board - Needs eye exam.  Foot exam up to date.

## 2023-09-07 NOTE — Assessment & Plan Note (Signed)
 Chronic, ongoing.  Continue current medication regimen and adjust as needed. Lipid panel today.

## 2023-09-07 NOTE — Assessment & Plan Note (Signed)
 Refer to mechanical valve plan of care.

## 2023-09-07 NOTE — Assessment & Plan Note (Signed)
 BMI 42.40 with T2DM, HTN/HLD.  Recommended eating smaller high protein, low fat meals more frequently and exercising 30 mins a day 5 times a week with a goal of 10-15lb weight loss in the next 3 months. Patient voiced their understanding and motivation to adhere to these recommendations.

## 2023-09-07 NOTE — Patient Instructions (Signed)
 Warfarin Blood Tests You will learn about the importance of your warfarin dosing, testing, and precautions. Also, signs that your warfarin dose is too high. To view the content, go to this web address: [email or web address]  This video will expire on: 04/19/2025. If you need access to this video following this date, please reach out to the healthcare provider who assigned it to you. This information is not intended to replace advice given to you by your health care provider. Make sure you discuss any questions you have with your health care provider. Elsevier Patient Education  2024 [employer].

## 2023-09-07 NOTE — Assessment & Plan Note (Signed)
 Chronic, ongoing.  BP above goal today, but has not been taking Benazepril .  Restart Benazepril  and continue HCTZ, Coreg  + Amlodipine , educated him on these medications and use + side effects.  Highly recommend he monitor BP at home at least a few mornings a week + focus on DASH diet.  Discussed with him stroke prevention goal for BP <130/80.  LABS: CMP.  Continue cardiology collaboration.  Continue to collaborate with [facility] PharmD.

## 2023-09-08 ENCOUNTER — Other Ambulatory Visit: Payer: Self-pay | Admitting: Nurse Practitioner

## 2023-09-08 ENCOUNTER — Telehealth: Payer: Self-pay

## 2023-09-08 LAB — COMPREHENSIVE METABOLIC PANEL WITH GFR
ALT: 42 IU/L (ref 0–44)
AST: 37 IU/L (ref 0–40)
Albumin: 4.4 g/dL (ref 3.9–4.9)
Alkaline Phosphatase: 106 IU/L (ref 44–121)
BUN/Creatinine Ratio: 18 (ref 10–24)
BUN: 25 mg/dL (ref 8–27)
Bilirubin Total: 0.4 mg/dL (ref 0.0–1.2)
CO2: 22 mmol/L (ref 20–29)
Calcium: 9.5 mg/dL (ref 8.6–10.2)
Chloride: 99 mmol/L (ref 96–106)
Creatinine, Ser: 1.42 mg/dL — ABNORMAL HIGH (ref 0.76–1.27)
Globulin, Total: 2.8 g/dL (ref 1.5–4.5)
Glucose: 107 mg/dL — ABNORMAL HIGH (ref 70–99)
Potassium: 3.9 mmol/L (ref 3.5–5.2)
Sodium: 138 mmol/L (ref 134–144)
Total Protein: 7.2 g/dL (ref 6.0–8.5)
eGFR: 56 mL/min/{1.73_m2} — ABNORMAL LOW (ref >59)

## 2023-09-08 LAB — LIPID PANEL W/O CHOL/HDL RATIO
Cholesterol, Total: 117 mg/dL (ref 100–199)
HDL: 23 mg/dL — ABNORMAL LOW (ref >39)
LDL Chol Calc (NIH): 57 mg/dL (ref 0–99)
Triglycerides: 227 mg/dL — ABNORMAL HIGH (ref 0–149)
VLDL Cholesterol Cal: 37 mg/dL (ref 5–40)

## 2023-09-08 MED ORDER — JARDIANCE 25 MG PO TABS: 25.0000 mg | ORAL_TABLET | Freq: Every morning | ORAL | 4 refills | Status: DC

## 2023-09-08 NOTE — Progress Notes (Signed)
 Good morning, please let Mariana know his labs have returned: - Kidney function is showing some very mild decline from his baseline.  Goal is to get diabetes under control which we will work on and I placed a new referral to endocrinology to assist us .  Cross fingers we can get you in locally, if not we will get you back to [place]. - I also placed a referral through to the Coumadin  Clinic locally to help us  in monitoring your INR more closely, this is an outpatient clinic that only focuses on INR levels.  Goal is to get you more regular on this. - Lipid panel notes LDL at goal, but triglycerides still a little elevated.  Continue statin therapy and focus heavily on healthy diet choices.  Any questions? Keep being amazing!!  Thank you for allowing me to participate in your care.  I appreciate you. Kindest regards, Nathyn Luiz

## 2023-09-08 NOTE — Progress Notes (Signed)
   09/08/2023  Patient ID: Charm Coombs [name], male   DOB: [birth year], 63 y.o.   MRN: [id number]  Patient outreach to verify Mr. Duane has all needed medications.  He endorses having everything except for Jardiance  and Libre 3 reader.  Informed patient that, since insurance active again, Jardiance  should go through with copay card for $10.  [facility], and Jardiance  is in process to be filled and is going through for $10.  Jerrilyn Moras 3 Reader is requiring a PA- so I have submitted a request to insurance via CoverMyMeds [facility] [id number]).  Patient was provided with Jerrilyn Moras 3 during recent hospital visit, but his phone was not capable of downloading app.  Of note, it appears Dexcom may be the preferred CGM on his insurance plan.  If PA denied, I would suggest prescribing Dexcom G7 Reader and Sensors.  I have provided the patient with the above information and scheduled a telephone follow-up in 2 weeks.  Linn Rich, PharmD, DPLA

## 2023-09-09 NOTE — Patient Instructions (Signed)
 Vitamin K Foods and Warfarin Warfarin is a blood thinner (anticoagulant). Anticoagulant medicines help prevent blood clots from forming or getting bigger. Warfarin works by blocking the activity of vitamin K. Vitamin K promotes normal blood clotting. When you take warfarin, problems can occur from suddenly increasing or decreasing the amount of vitamin K that you eat from one day to the next. These problems can occur due to varying levels of warfarin in your blood. Problems may include blood clots or bleeding. What are tips for eating the right amount of vitamin K? Reading food labels Know which foods contain vitamin K. Read food labels. Use the lists below to understand serving sizes and the amount of vitamin K in one serving. If you take a multivitamin that contains vitamin K, be sure to take it every day. Meal planning To avoid problems when taking warfarin: Eat a balanced diet that includes: Fresh fruits and vegetables. Whole grains. Low-fat dairy products. Lean proteins, such as fish, eggs, and lean cuts of meat. Avoid major changes in your diet. If you are going to change your diet, talk with your health care provider before making changes. Keep your intake of vitamin K consistent from day to day. Avoid eating large amounts of vitamin K one day and small amounts of vitamin K the next day. Work with a dietitian to develop a meal plan that works best for you.  What foods are high in vitamin K? Foods that are high in vitamin K contain more than 100 mcg (micrograms) per serving. These include: Broccoli (cooked from fresh) -  cup (78 g) has 110 mcg. Brussels sprouts (cooked from fresh) -  cup (78 g) has 109 mcg. Greens, beet (cooked from fresh) -  cup (72 g) has 350 mcg. Greens, collard (cooked from fresh) -  cup (66 g) has 263 mcg. Greens, turnip (cooked from fresh) -  cup (72 g) has 265 mcg. Green onions or scallions -  cup (50 g) has 105 mcg. Kale (cooked from fresh) -  cup (68  g) has 536 mcg. Parsley (raw) - 10 sprigs (10 g) has 164 mcg. Spinach (cooked from fresh) -  cup (90 g) has 444 mcg. Swiss chard (cooked from fresh) -  cup (88 g) has 287 mcg. The items listed above may not be a complete list of foods high in Vitamin K. Actual amounts of Vitamin K may differ depending on processing. Contact a dietitian for more information. What foods have a moderate amount of vitamin K? Foods that have a moderate amount of vitamin K contain 25-100 mcg per serving. These include: Asparagus (cooked from fresh) - 4 spears (60 g) have 30 mcg. Black-eyed peas (dried) -  cup (85 g) has 32 mcg. Cabbage (cooked from fresh) -  cup (78 g) has 84 mcg. Cabbage (raw) -  cup (35 g) has 26 mcg. Kiwi fruit - 1 medium (69 g) has 27 mcg. Lettuce (raw) - 1 cup (36 g) has 45 mcg. Okra (cooked from fresh) -  cup (80 g) has 32 mcg. Prunes (dried) - 5 prunes (47 g) have 25 mcg. Tuna, light, canned in oil - 3 oz (85 g) has 37 mcg. Watercress (raw) - 1 cup (34 g) has 85 mcg. The items listed above may not be a complete list of foods with a moderate amount of Vitamin K. Actual amounts of Vitamin K may differ depending on processing. Contact a dietitian for more information. What foods are low in vitamin K? Foods low in  vitamin K contain less than 25 mcg per serving. These include: Artichoke - 1 medium (128 g) has 18 mcg. Avocado - 1 oz (21 g) has 6 mcg. Blueberries -  cup (73 g) has 14 mcg. Carrots (cooked from fresh) -  cup (78 g) has 11 mcg. Cauliflower (raw) -  cup (54 g) has 8 mcg. Cucumber with peel (raw) -  cup (52 g) has 9 mcg. Grapes -  cup (76 g) has 12 mcg. Mango - 1 medium (207 g) has 9 mcg. Mixed nuts - 1 cup (142 g) has 17 mcg. Pear - 1 medium (178 g) has 8 mcg. Peas (cooked from fresh) -  cup (80 g) has 20 mcg. Pickled cucumber - 1 spear (65 g) has 11 mcg. Sauerkraut (canned) -  cup (118 g) has 16 mcg. Soybeans (cooked from fresh) -  cup (86 g) has 16 mcg. Tomato  (raw) - 1 medium (123 g) has 10 mcg. Tomato sauce (raw) -  cup (123 g) has 17 mcg. The items listed above may not be a complete list of foods low in Vitamin K. Actual amounts of Vitamin K may differ depending on processing. Contact a dietitian for more information. What foods do not have vitamin K? If a food contains less than 5 mcg per serving, it is considered to have no vitamin K. These foods include: Bread and cereal products. Cheese. Eggs. Fish and shellfish. Meat and poultry. Milk and dairy products. Seeds, such as sunflower or pumpkin seeds. The items listed above may not be a complete list of foods that do not have vitamin K. Actual amounts of vitamin K may differ depending on processing. Contact a dietitian for more information. Summary Warfarin is an anticoagulant that prevents blood clots from forming or getting bigger by blocking the activity of vitamin K. It is important to know the amount of vitamin K that is in the foods you eat and to keep your intake of vitamin K consistent from day to day. Avoid major changes in your diet. If you are going to change your diet, talk with your health care provider before making changes. This information is not intended to replace advice given to you by your health care provider. Make sure you discuss any questions you have with your health care provider. Document Revised: 07/01/2020 Document Reviewed: 07/01/2020 Elsevier Patient Education  2024 [employer].

## 2023-09-09 NOTE — Telephone Encounter (Signed)
 Requested medication (s) are due for refill today: no, PA needed  Requested medication (s) are on the active medication list: yes  Last refill:  09/06/23  Future visit scheduled: no  Notes to clinic:    Pharmacy comment: Alternative Requested:PA REQUIRED.       Requested Prescriptions  Pending Prescriptions Disp Refills   Continuous Glucose Receiver (FREESTYLE LIBRE 3 READER) DEVI [Pharmacy Med Name: FREESTYLE LIBRE 3 READER] 1 each 0    Sig: 1 each by Does not apply route daily at 2 PM.     Endocrinology: Diabetes - Testing Supplies Passed - 09/09/2023 11:31 AM      Passed - Valid encounter within last 12 months    Recent Outpatient Visits           2 days ago Type 2 diabetes mellitus with peripheral neuropathy (HCC)   [place] [facility] [place], [place] T, NP   2 months ago History of mechanical aortic valve replacement   [place] [facility] [place], [place] T, NP   2 months ago History of mechanical aortic valve replacement   [place] [facility] [place], [place] T, NP   2 months ago History of mechanical aortic valve replacement   [place] [facility] [place], Lavelle Posey, NP

## 2023-09-11 ENCOUNTER — Ambulatory Visit (INDEPENDENT_AMBULATORY_CARE_PROVIDER_SITE_OTHER): Admitting: Nurse Practitioner

## 2023-09-11 ENCOUNTER — Encounter: Payer: Self-pay | Admitting: Nurse Practitioner

## 2023-09-11 VITALS — BP 128/82 | HR 71 | Ht 65.0 in | Wt 254.0 lb

## 2023-09-11 DIAGNOSIS — Z952 Presence of prosthetic heart valve: Secondary | ICD-10-CM

## 2023-09-11 DIAGNOSIS — E1129 Type 2 diabetes mellitus with other diabetic kidney complication: Secondary | ICD-10-CM | POA: Diagnosis not present

## 2023-09-11 DIAGNOSIS — R809 Proteinuria, unspecified: Secondary | ICD-10-CM | POA: Diagnosis not present

## 2023-09-11 DIAGNOSIS — Z7901 Long term (current) use of anticoagulants: Secondary | ICD-10-CM | POA: Diagnosis not present

## 2023-09-11 LAB — COAGUCHEK XS/INR WAIVED
INR: 2.3 — ABNORMAL HIGH (ref 0.9–1.1)
Prothrombin Time: 27.4 s

## 2023-09-11 NOTE — Assessment & Plan Note (Signed)
 Refer to mechanical valve plan of care.

## 2023-09-11 NOTE — Assessment & Plan Note (Signed)
 Chronic, ongoing with Coumadin  use. INR 2.3 today, trending up and off Lovenox  since Saturday.  Continue Coumadin  10 MG daily and remain off Lovenox .  Not to take Ibuprofen due to risk for bleeding with this and only use Tylenol  or OTC Voltaren gel or Icy/Hot patches.  Remain off Fenofibrate .

## 2023-09-11 NOTE — Progress Notes (Signed)
 BP 128/82 (BP Location: Left Arm, Patient Position: Sitting, Cuff Size: Normal)   Pulse 71   Ht 5\' 5"  (1.651 m)   Wt 254 lb (115.2 kg)   SpO2 92%   BMI 42.27 kg/m    Subjective:    Patient ID: Rodney Coombs [name], male    DOB: Dec 16, 1960, 63 y.o.   MRN: [id number]  CC: Coumadin  management  HPI: This patient is a 63 y.o. male who presents for coumadin  management. The expected duration of coumadin  treatment is lifelong The reason for anticoagulation is  mechanical heart valve. Follows with cardiology, Dr. Parks Bollman, last on 03/17/2023, no changes. Echo 03/13/2020 with LVEF greater then 55%, stable valve. Stopped taking Lovenox  on Saturday due to increased bruising  He needs referral for eye doctor due to vision changes + also gets light headaches to left side.  Continues to have issues with lower back pain with occasional left sided sciatic.    Present Coumadin  dose: 10 MG daily Goal: 2.5-3.5 The patient does not have an INR goal. Excessive bruising: no Nose bleeding: no Rectal bleeding: no Prolonged menstrual cycles: N/A Eating diet with consistent amounts of foods containing Vitamin K:no Any recent antibiotic use? no  Relevant past medical, surgical, family and social history reviewed and updated as indicated. Interim medical history since our last visit reviewed. Allergies and medications reviewed and updated.  ROS: Per HPI unless specifically indicated above     Objective:    BP 128/82 (BP Location: Left Arm, Patient Position: Sitting, Cuff Size: Normal)   Pulse 71   Ht 5\' 5"  (1.651 m)   Wt 254 lb (115.2 kg)   SpO2 92%   BMI 42.27 kg/m   Wt Readings from Last 3 Encounters:  09/11/23 254 lb (115.2 kg)  09/07/23 254 lb 12.8 oz (115.6 kg)  09/05/23 260 lb 9.3 oz (118.2 kg)     General: Well appearing, well nourished in no distress.  Normal mood and affect. Skin: No excessive bruising or rash.  Last INR: 1.9  Last CBC:  Lab Results  Component Value Date   WBC 10.6  (H) 09/05/2023   HGB 15.4 09/05/2023   HCT 44.9 09/05/2023   MCV 82.7 09/05/2023   PLT 195 09/05/2023    Results for orders placed or performed in visit on 09/07/23  Comprehensive metabolic panel with GFR   Collection Time: 09/07/23 11:30 AM  Result Value Ref Range   Glucose 107 (H) 70 - 99 mg/dL   BUN 25 8 - 27 mg/dL   Creatinine, Ser 3.24 (H) 0.76 - 1.27 mg/dL   eGFR 56 (L) >40 NU/UVO/5.36   BUN/Creatinine Ratio 18 10 - 24   Sodium 138 134 - 144 mmol/L   Potassium 3.9 3.5 - 5.2 mmol/L   Chloride 99 96 - 106 mmol/L   CO2 22 20 - 29 mmol/L   Calcium  9.5 8.6 - 10.2 mg/dL   Total Protein 7.2 6.0 - 8.5 g/dL   Albumin 4.4 3.9 - 4.9 g/dL   Globulin, Total 2.8 1.5 - 4.5 g/dL   Bilirubin Total 0.4 0.0 - 1.2 mg/dL   Alkaline Phosphatase 106 44 - 121 IU/L   AST 37 0 - 40 IU/L   ALT 42 0 - 44 IU/L  Lipid Panel w/o Chol/HDL Ratio   Collection Time: 09/07/23 11:30 AM  Result Value Ref Range   Cholesterol, Total 117 100 - 199 mg/dL   Triglycerides 644 (H) 0 - 149 mg/dL   HDL 23 (L) >03  mg/dL   VLDL Cholesterol Cal 37 5 - 40 mg/dL   LDL Chol Calc (NIH) 57 0 - 99 mg/dL  CoaguChek XS/INR Waived   Collection Time: 09/07/23 11:30 AM  Result Value Ref Range   INR 1.9 (H) 0.9 - 1.1   Prothrombin Time 23.4 sec       Assessment:     ICD-10-CM   1. History of mechanical aortic valve replacement  Z95.2 CoaguChek XS/INR Waived    2. Current use of long term anticoagulation  Z79.01 CoaguChek XS/INR Waived    3. Type 2 diabetes mellitus with proteinuria (HCC)  E11.29 Ambulatory referral to Ophthalmology   R80.9       Plan:   Discussed current plan face-to-face with patient. For coumadin  dosing, elected to maintain at 10 MG daily. Will plan to recheck INR in 1 week.  Stay off Lovenox .

## 2023-09-18 ENCOUNTER — Ambulatory Visit (INDEPENDENT_AMBULATORY_CARE_PROVIDER_SITE_OTHER): Admitting: Nurse Practitioner

## 2023-09-18 ENCOUNTER — Encounter: Payer: Self-pay | Admitting: Nurse Practitioner

## 2023-09-18 VITALS — BP 128/68 | HR 77 | Temp 98.1°F | Ht 65.0 in | Wt 253.8 lb

## 2023-09-18 DIAGNOSIS — Z7901 Long term (current) use of anticoagulants: Secondary | ICD-10-CM

## 2023-09-18 DIAGNOSIS — Z952 Presence of prosthetic heart valve: Secondary | ICD-10-CM

## 2023-09-18 DIAGNOSIS — I482 Chronic atrial fibrillation, unspecified: Secondary | ICD-10-CM

## 2023-09-18 MED ORDER — WARFARIN SODIUM 10 MG PO TABS: 10.0000 mg | ORAL_TABLET | Freq: Every day | ORAL | 1 refills | Status: DC

## 2023-09-18 MED ORDER — WARFARIN SODIUM 1 MG PO TABS: 1.0000 mg | ORAL_TABLET | Freq: Every day | ORAL | 1 refills | Status: DC

## 2023-09-18 NOTE — Assessment & Plan Note (Signed)
 Chronic, ongoing with Coumadin  use. INR 2.1 today, trending down slightly and not at goal.  Change Coumadin  to 11 MG Tuesday and Friday, then 10 MG remainder of days.  Not to take Ibuprofen due to risk for bleeding with this and only use Tylenol  or OTC Voltaren gel or Icy/Hot patches.  Remain off Fenofibrate .

## 2023-09-18 NOTE — Progress Notes (Signed)
   BP 128/68 (BP Location: Left Arm, Patient Position: Sitting, Cuff Size: Normal)   Pulse 77   Temp 98.1 F (36.7 C) (Oral)   Ht 5\' 5"  (1.651 m)   Wt 253 lb 12.8 oz (115.1 kg)   SpO2 96%   BMI 42.23 kg/m    Subjective:    Patient ID: Rodney Coombs [name], male    DOB: 11-11-1960, 63 y.o.   MRN: [id number]  CC: Coumadin  management  HPI: This patient is a 63 y.o. male who presents for coumadin  management. The expected duration of coumadin  treatment is lifelong The reason for anticoagulation is  mechanical heart valve. Taking 10 MG daily of Coumadin .  Follows with cardiology, Dr. Parks Bollman, last on 03/17/2023, no changes. Echo 09/05/23 with LVEF 60-65%, stable valve -- moderate aortic valve stenosis.   Present Coumadin  dose: 10 MG daily Goal: 2.5-3.5 The patient does not have an INR goal. Excessive bruising: no Nose bleeding: no Rectal bleeding: no Prolonged menstrual cycles: N/A Eating diet with consistent amounts of foods containing Vitamin K:no Any recent antibiotic use? no  Relevant past medical, surgical, family and social history reviewed and updated as indicated. Interim medical history since our last visit reviewed. Allergies and medications reviewed and updated.  ROS: Per HPI unless specifically indicated above     Objective:    BP 128/68 (BP Location: Left Arm, Patient Position: Sitting, Cuff Size: Normal)   Pulse 77   Temp 98.1 F (36.7 C) (Oral)   Ht 5\' 5"  (1.651 m)   Wt 253 lb 12.8 oz (115.1 kg)   SpO2 96%   BMI 42.23 kg/m   Wt Readings from Last 3 Encounters:  09/18/23 253 lb 12.8 oz (115.1 kg)  09/11/23 254 lb (115.2 kg)  09/07/23 254 lb 12.8 oz (115.6 kg)     General: Well appearing, well nourished in no distress.  Normal mood and affect. Skin: No excessive bruising or rash  Last INR: 2.3    Last CBC:  Lab Results  Component Value Date   WBC 10.6 (H) 09/05/2023   HGB 15.4 09/05/2023   HCT 44.9 09/05/2023   MCV 82.7 09/05/2023   PLT 195  09/05/2023    Results for orders placed or performed in visit on 09/11/23  CoaguChek XS/INR Waived   Collection Time: 09/11/23 11:31 AM  Result Value Ref Range   INR 2.3 (H) 0.9 - 1.1   Prothrombin Time 27.4 sec       Assessment:     ICD-10-CM   1. Chronic atrial fibrillation (HCC)  I48.20     2. Current use of long term anticoagulation  Z79.01 CoaguChek XS/INR Waived    3. Mechanical heart valve present  Z95.2 CoaguChek XS/INR Waived      Plan:   Discussed current plan face-to-face with patient. For coumadin  dosing, elected to change dose to 11 MG Tuesday and Friday, then 10 MG remainder of days. Will plan to recheck INR in 1 week.

## 2023-09-18 NOTE — Assessment & Plan Note (Signed)
 Chronic, ongoing with Coumadin  use.  Continue collaboration with cardiology, last note reviewed.  Continue all medications.

## 2023-09-18 NOTE — Assessment & Plan Note (Signed)
 Refer to mechanical valve plan of care.

## 2023-09-18 NOTE — Patient Instructions (Addendum)
 Increase Coumadin  to 11 MG Tuesday and Friday then 10 MG Monday, Wednesday, Thursday, Saturday, Sunday.  Warfarin Life Changes Learn about lifestyle changes and extra care you need to take when you're taking warfarin.  To view the content, go to this web address: [email or web address]  This video will expire on: 04/19/2025. If you need access to this video following this date, please reach out to the healthcare provider who assigned it to you. This information is not intended to replace advice given to you by your health care provider. Make sure you discuss any questions you have with your health care provider. Elsevier Patient Education  2024 [employer].

## 2023-09-19 ENCOUNTER — Ambulatory Visit: Payer: Self-pay | Admitting: *Deleted

## 2023-09-19 ENCOUNTER — Other Ambulatory Visit: Payer: Self-pay | Admitting: Nurse Practitioner

## 2023-09-19 LAB — COAGUCHEK XS/INR WAIVED
INR: 2.1 — ABNORMAL HIGH (ref 0.9–1.1)
Prothrombin Time: 25.7 s

## 2023-09-19 NOTE — Telephone Encounter (Unsigned)
 Copied from CRM [id number]. Topic: Clinical - Medication Refill >> Sep 19, 2023 12:22 PM Turkey B wrote: Medication: Accu-Chek Softclix Lancets lancets  Has the patient contacted their pharmacy? yes (Agent: If yes, when and what did the pharmacy advise?)contact pcp/pt days also hasn't been getting quantitiy of 100 for some reason  This is the patient's preferred pharmacy:  [id number] [place],  - [address] [place] [address] Phone: [phone] Fax: [phone]   Is this the correct pharmacy for this prescription? yes   Has the prescription been filled recently? no  Is the patient out of the medication? yes  Has the patient been seen for an appointment in the last year OR does the patient have an upcoming appointment? yes  Can we respond through MyChart? no  Agent: Please be advised that Rx refills may take up to 3 business days. We ask that you follow-up with your pharmacy.

## 2023-09-19 NOTE — Telephone Encounter (Signed)
 Called pharmacy to review Rx coumadin . Pharmacy currently closed for lunch.    Copied From CRM [id number]. Reason for Triage: pt called in about Warfarin refills. He states pharmacy needs to be speak with Dr Alton Au about it, states something about they can't fill it pass June 2.

## 2023-09-20 ENCOUNTER — Other Ambulatory Visit: Payer: Self-pay

## 2023-09-20 ENCOUNTER — Telehealth: Payer: Self-pay | Admitting: Nurse Practitioner

## 2023-09-20 MED ORDER — OMRON 3 SERIES BP MONITOR DEVI: Status: AC

## 2023-09-20 MED ORDER — ACCU-CHEK AVIVA PLUS VI STRP: ORAL_STRIP | 12 refills | Status: AC

## 2023-09-20 MED ORDER — ACCU-CHEK SOFTCLIX LANCETS MISC: 12 refills | Status: AC

## 2023-09-20 NOTE — Telephone Encounter (Signed)
 [facility]. Medications were picked up today and both prescriptions have refills on them.  Called and notified patient of the above.

## 2023-09-20 NOTE — Progress Notes (Signed)
   09/20/2023  Patient ID: Charm Coombs [name], male   DOB: [birth year], 63 y.o.   MRN: [id number]  Subjective/Objective Telephone visit to follow-up on management of chronic conditions  Diabetes -Current medications:  Fiasp  14 units TID with meals, Basaglar  60 units daily, Jardiance  25mg  daily -Patient was using Libre 3 for CGM provided during recent hospitalization, but insurance will not pay for this product- Dexcom is preferred on insurance, but sensors would be $95/month.  Patient prefers using finger sticks to check home BG based on affordability. -Patient was able to pick up Jardiance  25mg  and resume therapy -A1c 12.3% during recent hospitalization- up from 11.6% previously -PCP placed referral for endocrinology  Hypertension -Current medications:  amlodipine  5mg  daily, benazepril  40mg  daily, carvedilol  25mg  BID, hydrochlorothiazide  25mg  daily -Patient does not have home BP monitor -BP 128/68 at recent PCP visit for INR check  Medication Access/Adherence -Warfarin dose recently adjusted to 11mg  daily (10mg  tablet + 1mg  tablet), but patient states pharmacy has not been able to fill prescription sent in on 5/12  Assessment/Plan  Diabetes  -Uncontrolled -Sending updated orders in for test strips and lancets for patient to test BG up to 4x/d (fasting and after meals), so insulin  can be adjusted accordingly -Establish care with endocrinology  Hypertension -Controlled based on most recent OV reading -Continue current regimen at this time -Supplying patient with home BP monitor under [facility] standing order - insurance does not cover, and affordability is a concern -Monitor and record home BP at least 3x/week  Medication Access/Adherence -[facility], and [facility]in 1mg  was ready for pick up; and they were able to successfully process 10mg  prescription also  Follow-up:  2 weeks  Linn Rich, PharmD, DPLA

## 2023-09-20 NOTE — Telephone Encounter (Signed)
 Copied from CRM [id number]. Topic: Clinical - Prescription Issue >> Sep 19, 2023 12:24 PM Turkey B wrote: Reason for CRM: pt called in about Warfarin refills. He states pharmacy needs to be speak with Dr Alton Au about it, states something about they can't fill it pass June 2. Please cb

## 2023-09-23 NOTE — Patient Instructions (Addendum)
 CALL ENDOCRINOLOGY AT [phone]  Warfarin Life Changes Learn about lifestyle changes and extra care you need to take when you're taking warfarin.  To view the content, go to this web address: [email or web address]  This video will expire on: 04/19/2025. If you need access to this video following this date, please reach out to the healthcare provider who assigned it to you. This information is not intended to replace advice given to you by your health care provider. Make sure you discuss any questions you have with your health care provider. Elsevier Patient Education  2024 [employer].

## 2023-09-25 ENCOUNTER — Encounter: Payer: Self-pay | Admitting: Nurse Practitioner

## 2023-09-25 ENCOUNTER — Ambulatory Visit (INDEPENDENT_AMBULATORY_CARE_PROVIDER_SITE_OTHER): Admitting: Nurse Practitioner

## 2023-09-25 VITALS — BP 129/82 | HR 75 | Temp 97.6°F | Resp 18 | Wt 254.0 lb

## 2023-09-25 DIAGNOSIS — Z952 Presence of prosthetic heart valve: Secondary | ICD-10-CM

## 2023-09-25 DIAGNOSIS — I482 Chronic atrial fibrillation, unspecified: Secondary | ICD-10-CM | POA: Diagnosis not present

## 2023-09-25 DIAGNOSIS — Z7901 Long term (current) use of anticoagulants: Secondary | ICD-10-CM

## 2023-09-25 LAB — COAGUCHEK XS/INR WAIVED
INR: 3.4 — ABNORMAL HIGH (ref 0.9–1.1)
Prothrombin Time: 40.6 s

## 2023-09-25 NOTE — Progress Notes (Signed)
   BP 129/82 (BP Location: Left Arm, Patient Position: Sitting, Cuff Size: Large)   Pulse 75   Temp 97.6 F (36.4 C) (Oral)   Resp 18   Wt 254 lb (115.2 kg)   SpO2 94%   BMI 42.27 kg/m    Subjective:    Patient ID: Rodney Coombs [name], male    DOB: August 04, 1960, 63 y.o.   MRN: [id number]  CC: Coumadin  management  HPI: This patient is a 63 y.o. male who presents for coumadin  management. The expected duration of coumadin  treatment is lifelong The reason for anticoagulation is  mechanical heart valve. Follows with cardiology, Dr. Parks Bollman, last on 03/17/2023, no changes. Echo 09/05/23 with LVEF 60-65%, stable valve -- moderate aortic valve stenosis.   Present Coumadin  dose: 11 MG Tuesday and Friday, then 10 MG remainder of days Goal: 2.5-3.5 The patient does not have an INR goal. Excessive bruising: no Nose bleeding: no Rectal bleeding: no Prolonged menstrual cycles: N/A Eating diet with consistent amounts of foods containing Vitamin K:no Any recent antibiotic use? no  Relevant past medical, surgical, family and social history reviewed and updated as indicated. Interim medical history since our last visit reviewed. Allergies and medications reviewed and updated.  ROS: Per HPI unless specifically indicated above     Objective:    BP 129/82 (BP Location: Left Arm, Patient Position: Sitting, Cuff Size: Large)   Pulse 75   Temp 97.6 F (36.4 C) (Oral)   Resp 18   Wt 254 lb (115.2 kg)   SpO2 94%   BMI 42.27 kg/m   Wt Readings from Last 3 Encounters:  09/25/23 254 lb (115.2 kg)  09/18/23 253 lb 12.8 oz (115.1 kg)  09/11/23 254 lb (115.2 kg)     General: Well appearing, well nourished in no distress.  Normal mood and affect. Skin: No excessive bruising or rash  Last INR: 2.1 -- we sent in all new pills    Last CBC:  Lab Results  Component Value Date   WBC 10.6 (H) 09/05/2023   HGB 15.4 09/05/2023   HCT 44.9 09/05/2023   MCV 82.7 09/05/2023   PLT 195 09/05/2023     Results for orders placed or performed in visit on 09/18/23  CoaguChek XS/INR Waived   Collection Time: 09/18/23  4:16 PM  Result Value Ref Range   INR 2.1 (H) 0.9 - 1.1   Prothrombin Time 25.7 sec       Assessment:     ICD-10-CM   1. Chronic atrial fibrillation (HCC)  I48.20 CoaguChek XS/INR Waived    2. Mechanical heart valve present  Z95.2 CoaguChek XS/INR Waived    3. Current use of long term anticoagulation  Z79.01 CoaguChek XS/INR Waived      Plan:   Discussed current plan face-to-face with patient. For coumadin  dosing, elected to change dose to 11 MG once a week and then 10 MG remainder of days. Will plan to recheck INR in 1 week.

## 2023-09-25 NOTE — Assessment & Plan Note (Signed)
 Chronic, ongoing with Coumadin  use. INR 3.4 today, trending up with new pills and dose change.  Concern will get too elevated. Change Coumadin  to 11 MG once a week and then 10 MG remainder of days.  Not to take Ibuprofen due to risk for bleeding with this and only use Tylenol  or OTC Voltaren gel or Icy/Hot patches.  Remain off Fenofibrate .

## 2023-09-25 NOTE — Assessment & Plan Note (Signed)
 Chronic, ongoing with Coumadin  use.  Continue collaboration with cardiology, last note reviewed.  Continue all medications.

## 2023-09-25 NOTE — Assessment & Plan Note (Signed)
 Refer to mechanical valve plan of care.

## 2023-10-01 ENCOUNTER — Other Ambulatory Visit: Payer: Self-pay | Admitting: Nurse Practitioner

## 2023-10-01 NOTE — Patient Instructions (Incomplete)
 Hold dose this evening and then change to 10 MG daily starting tomorrow + stop fish oil.  Heart-Healthy Eating Plan Many factors influence your heart health, including eating and exercise habits. Heart health is also called coronary health. Coronary risk increases with abnormal blood fat (lipid) levels. A heart-healthy eating plan includes limiting unhealthy fats, increasing healthy fats, limiting salt (sodium) intake, and making other diet and lifestyle changes. What is my plan? Your health care provider may recommend that: You limit your fat intake to _________% or less of your total calories each day. You limit your saturated fat intake to _________% or less of your total calories each day. You limit the amount of cholesterol in your diet to less than _________ mg per day. You limit the amount of sodium in your diet to less than _________ mg per day. What are tips for following this plan? Cooking Cook foods using methods other than frying. Baking, boiling, grilling, and broiling are all good options. Other ways to reduce fat include: Removing the skin from poultry. Removing all visible fats from meats. Steaming vegetables in water or broth. Meal planning  At meals, imagine dividing your plate into fourths: Fill one-half of your plate with vegetables and green salads. Fill one-fourth of your plate with whole grains. Fill one-fourth of your plate with lean protein foods. Eat 2-4 cups of vegetables per day. One cup of vegetables equals 1 cup (91 g) broccoli or cauliflower florets, 2 medium carrots, 1 large bell pepper, 1 large sweet potato, 1 large tomato, 1 medium white potato, 2 cups (150 g) raw leafy greens. Eat 1-2 cups of fruit per day. One cup of fruit equals 1 small apple, 1 large banana, 1 cup (237 g) mixed fruit, 1 large orange,  cup (82 g) dried fruit, 1 cup (240 mL) 100% fruit juice. Eat more foods that contain soluble fiber. Examples include apples, broccoli, carrots, beans,  peas, and barley. Aim to get 25-30 g of fiber per day. Increase your consumption of legumes, nuts, and seeds to 4-5 servings per week. One serving of dried beans or legumes equals  cup (90 g) cooked, 1 serving of nuts is  oz (12 almonds, 24 pistachios, or 7 walnut halves), and 1 serving of seeds equals  oz (8 g). Fats Choose healthy fats more often. Choose monounsaturated and polyunsaturated fats, such as olive and canola oils, avocado oil, flaxseeds, walnuts, almonds, and seeds. Eat more omega-3 fats. Choose salmon, mackerel, sardines, tuna, flaxseed oil, and ground flaxseeds. Aim to eat fish at least 2 times each week. Check food labels carefully to identify foods with trans fats or high amounts of saturated fat. Limit saturated fats. These are found in animal products, such as meats, butter, and cream. Plant sources of saturated fats include palm oil, palm kernel oil, and coconut oil. Avoid foods with partially hydrogenated oils in them. These contain trans fats. Examples are stick margarine, some tub margarines, cookies, crackers, and other baked goods. Avoid fried foods. General information Eat more home-cooked food and less restaurant, buffet, and fast food. Limit or avoid alcohol. Limit foods that are high in added sugar and simple starches such as foods made using white refined flour (white breads, pastries, sweets). Lose weight if you are overweight. Losing just 5-10% of your body weight can help your overall health and prevent diseases such as diabetes and heart disease. Monitor your sodium intake, especially if you have high blood pressure. Talk with your health care provider about your sodium intake.  Try to incorporate more vegetarian meals weekly. What foods should I eat? Fruits All fresh, canned (in natural juice), or frozen fruits. Vegetables Fresh or frozen vegetables (raw, steamed, roasted, or grilled). Green salads. Grains Most grains. Choose whole wheat and whole grains  most of the time. Rice and pasta, including brown rice and pastas made with whole wheat. Meats and other proteins Lean, well-trimmed beef, veal, pork, and lamb. Chicken and [place] without skin. All fish and shellfish. Wild duck, rabbit, pheasant, and venison. Egg whites or low-cholesterol egg substitutes. Dried beans, peas, lentils, and tofu. Seeds and most nuts. Dairy Low-fat or nonfat cheeses, including ricotta and mozzarella. Skim or 1% milk (liquid, powdered, or evaporated). Buttermilk made with low-fat milk. Nonfat or low-fat yogurt. Fats and oils Non-hydrogenated (trans-free) margarines. Vegetable oils, including soybean, sesame, sunflower, olive, avocado, peanut, safflower, corn, canola, and cottonseed. Salad dressings or mayonnaise made with a vegetable oil. Beverages Water (mineral or sparkling). Coffee and tea. Unsweetened ice tea. Diet beverages. Sweets and desserts Sherbet, gelatin, and fruit ice. Small amounts of dark chocolate. Limit all sweets and desserts. Seasonings and condiments All seasonings and condiments. The items listed above may not be a complete list of foods and beverages you can eat. Contact a dietitian for more options. What foods should I avoid? Fruits Canned fruit in heavy syrup. Fruit in cream or butter sauce. Fried fruit. Limit coconut. Vegetables Vegetables cooked in cheese, cream, or butter sauce. Fried vegetables. Grains Breads made with saturated or trans fats, oils, or whole milk. Croissants. Sweet rolls. Donuts. High-fat crackers, such as cheese crackers and chips. Meats and other proteins Fatty meats, such as hot dogs, ribs, sausage, bacon, rib-eye roast or steak. High-fat deli meats, such as salami and bologna. Caviar. Domestic duck and goose. Organ meats, such as liver. Dairy Cream, sour cream, cream cheese, and creamed cottage cheese. Whole-milk cheeses. Whole or 2% milk (liquid, evaporated, or condensed). Whole buttermilk. Cream sauce or high-fat  cheese sauce. Whole-milk yogurt. Fats and oils Meat fat, or shortening. Cocoa butter, hydrogenated oils, palm oil, coconut oil, palm kernel oil. Solid fats and shortenings, including bacon fat, salt pork, lard, and butter. Nondairy cream substitutes. Salad dressings with cheese or sour cream. Beverages Regular sodas and any drinks with added sugar. Sweets and desserts Frosting. Pudding. Cookies. Cakes. Pies. Milk chocolate or white chocolate. Buttered syrups. Full-fat ice cream or ice cream drinks. The items listed above may not be a complete list of foods and beverages to avoid. Contact a dietitian for more information. Summary Heart-healthy meal planning includes limiting unhealthy fats, increasing healthy fats, limiting salt (sodium) intake and making other diet and lifestyle changes. Lose weight if you are overweight. Losing just 5-10% of your body weight can help your overall health and prevent diseases such as diabetes and heart disease. Focus on eating a balance of foods, including fruits and vegetables, low-fat or nonfat dairy, lean protein, nuts and legumes, whole grains, and heart-healthy oils and fats. This information is not intended to replace advice given to you by your health care provider. Make sure you discuss any questions you have with your health care provider. Document Revised: 05/31/2021 Document Reviewed: 05/31/2021 Elsevier Patient Education  2024 [employer].

## 2023-10-03 ENCOUNTER — Encounter: Payer: Self-pay | Admitting: Nurse Practitioner

## 2023-10-03 ENCOUNTER — Ambulatory Visit (INDEPENDENT_AMBULATORY_CARE_PROVIDER_SITE_OTHER): Admitting: Nurse Practitioner

## 2023-10-03 VITALS — BP 130/83 | HR 63 | Temp 97.6°F | Ht 66.0 in | Wt 249.6 lb

## 2023-10-03 DIAGNOSIS — Z23 Encounter for immunization: Secondary | ICD-10-CM

## 2023-10-03 DIAGNOSIS — I482 Chronic atrial fibrillation, unspecified: Secondary | ICD-10-CM | POA: Diagnosis not present

## 2023-10-03 DIAGNOSIS — Z7901 Long term (current) use of anticoagulants: Secondary | ICD-10-CM

## 2023-10-03 DIAGNOSIS — Z952 Presence of prosthetic heart valve: Secondary | ICD-10-CM | POA: Diagnosis not present

## 2023-10-03 LAB — COAGUCHEK XS/INR WAIVED
INR: 4.4 — ABNORMAL HIGH (ref 0.9–1.1)
Prothrombin Time: 53.1 s

## 2023-10-03 NOTE — Progress Notes (Signed)
   BP 130/83   Pulse 63   Temp 97.6 F (36.4 C) (Oral)   Ht 5\' 6"  (1.676 m)   Wt 249 lb 9.6 oz (113.2 kg)   SpO2 98%   BMI 40.29 kg/m    Subjective:    Patient ID: Rodney Coombs [name], male    DOB: 1961-03-23, 63 y.o.   MRN: [id number]  CC: Coumadin  management  HPI: This patient is a 63 y.o. male who presents for coumadin  management. The expected duration of coumadin  treatment is lifelong The reason for anticoagulation is  mechanical heart valve. Follows with cardiology, Dr. Parks Bollman, last on 03/17/2023, no changes. Echo 09/05/23 with LVEF 60-65%, stable valve -- moderate aortic valve stenosis. Started fish oil about one week ago.  Present Coumadin  dose: 11 MG once a week, then 10 MG remainder of days Goal: 2.5-3.5 The patient does not have an INR goal. Excessive bruising: no Nose bleeding: no Rectal bleeding: no Prolonged menstrual cycles: N/A Eating diet with consistent amounts of foods containing Vitamin K:no Any recent antibiotic use? no  Relevant past medical, surgical, family and social history reviewed and updated as indicated. Interim medical history since our last visit reviewed. Allergies and medications reviewed and updated.  ROS: Per HPI unless specifically indicated above     Objective:    BP 130/83   Pulse 63   Temp 97.6 F (36.4 C) (Oral)   Ht 5\' 6"  (1.676 m)   Wt 249 lb 9.6 oz (113.2 kg)   SpO2 98%   BMI 40.29 kg/m   Wt Readings from Last 3 Encounters:  10/03/23 249 lb 9.6 oz (113.2 kg)  09/25/23 254 lb (115.2 kg)  09/18/23 253 lb 12.8 oz (115.1 kg)     General: Well appearing, well nourished in no distress.  Normal mood and affect. Skin: No excessive bruising or rash  Last INR: 3.4    Last CBC:  Lab Results  Component Value Date   WBC 10.6 (H) 09/05/2023   HGB 15.4 09/05/2023   HCT 44.9 09/05/2023   MCV 82.7 09/05/2023   PLT 195 09/05/2023    Results for orders placed or performed in visit on 09/25/23  CoaguChek XS/INR Waived    Collection Time: 09/25/23 11:31 AM  Result Value Ref Range   INR 3.4 (H) 0.9 - 1.1   Prothrombin Time 40.6 sec       Assessment:     ICD-10-CM   1. Chronic atrial fibrillation (HCC)  I48.20 CoaguChek XS/INR Waived    2. Mechanical heart valve present  Z95.2 CoaguChek XS/INR Waived    3. Current use of long term anticoagulation  Z79.01 CoaguChek XS/INR Waived    4. Need for Tdap vaccination  Z23 Tdap vaccine greater than or equal to 7yo IM   Tdap in office today. Educated on this.      Plan:   Discussed current plan face-to-face with patient. For coumadin  dosing, elected to change dose to holding dose this evening and then changing to 10 MG daily (1 MG total weekly reduction) + stop fish oil. Will plan to recheck INR in 1 week.

## 2023-10-03 NOTE — Assessment & Plan Note (Signed)
 Chronic, ongoing with Coumadin  use. INR 4.4 today, trending up but has been taking fish oil for one week.  Advised him to stop fish oil. Change Coumadin  to holding dose this evening and then changing to 10 MG daily (1 MG total weekly reduction) + stop fish oil.  Not to take Ibuprofen due to risk for bleeding with this and only use Tylenol  or OTC Voltaren gel or Icy/Hot patches.  Remain off Fenofibrate .

## 2023-10-03 NOTE — Assessment & Plan Note (Signed)
 Refer to mechanical valve plan of care.

## 2023-10-03 NOTE — Assessment & Plan Note (Signed)
 Chronic, ongoing with Coumadin  use.  Continue collaboration with cardiology, last note reviewed.  Continue all medications.

## 2023-10-04 ENCOUNTER — Other Ambulatory Visit: Payer: Self-pay

## 2023-10-04 NOTE — Progress Notes (Addendum)
   10/04/2023  Patient ID: Rodney Coombs [name], male   DOB: November 11, 1960, 63 y.o.   MRN: [id number]  Subjective/Objective Telephone visit to follow-up on management of chronic conditions   Diabetes -Current medications:  Fiasp  14 units TID with meals, Basaglar  60 units daily, Jardiance  25mg  daily -Patient is monitoring FBG daily and states this is averaging 110-130, which has improved greatly -Jardiance  25mg  is needing approval by insurance per patient- upon review of my notes, PA was approved 09/28/2022; so PA likely needs to be renewed -A1c 12.3% during recent hospitalization- up from 11.6% previously -Patient has a visit scheduled with endocrinology for 7/1   Hypertension -Current medications:  amlodipine  5mg  daily, benazepril  40mg  daily, carvedilol  25mg  BID, hydrochlorothiazide  25mg  daily -Patient was provided BP monitor at 5/19 PCP visit and has been checking home BP regularly- endorses readings typically 130/80 or lower -BP 130/83 at recent PCP visit for INR check  Assessment/Plan   Diabetes  -Uncontrolled based on A1c, but current FBG reflects improved control -Continue current regimen until appointment with endocrinology 7/1 -Coordinating with PA team to assist with Jardiance  coverage -Continue to monitor BG regularly   Hypertension -Controlled  -Continue current regimen at this time -Monitor and record home BP at least 3x/week   Follow-up:  7/3   Linn Rich, PharmD, DPLA

## 2023-10-04 NOTE — Telephone Encounter (Signed)
 Called pharmacy ([facility]) and spoke to [facility] Scientist, research (physical sciences)) about test strips that were requested. He advised to refuse this request as there is a duplicate request on file.

## 2023-10-05 ENCOUNTER — Telehealth: Payer: Self-pay

## 2023-10-05 ENCOUNTER — Other Ambulatory Visit (HOSPITAL_COMMUNITY): Payer: Self-pay

## 2023-10-05 NOTE — Progress Notes (Signed)
 PA request has been Approved. New Encounter has been or will be created for follow up. For additional info see Pharmacy Prior Auth telephone encounter from 10/05/2023.

## 2023-10-05 NOTE — Progress Notes (Signed)
   10/05/2023  Patient ID: Rodney Coombs [name], male   DOB: 26-Jul-1960, 63 y.o.   MRN: [id number]  Prior authorization for Jardiance  25mg  has been approved.  Contacted [facility] to inform and have them process refill on insurance and copay card.  Medication is going through for $10.  Telephone follow-up is scheduled with patient in July.  Linn Rich, PharmD, DPLA

## 2023-10-05 NOTE — Telephone Encounter (Signed)
 Pharmacy Patient Advocate Encounter   Received notification from [facility] that prior authorization for Jardiance  is required/requested.   Insurance verification completed.   The patient is insured through [facility] .   Per test claim: PA required and submitted KEY/EOC/Request #: [id number] from 10/05/2023 to 10/04/2024. Ran test claim, Copay is $53.56. This test claim was processed through [facility]- copay amounts may vary at other pharmacies due to pharmacy/plan contracts, or as the patient moves through the different stages of their insurance plan.

## 2023-10-07 NOTE — Patient Instructions (Incomplete)

## 2023-10-10 ENCOUNTER — Encounter: Payer: Self-pay | Admitting: Nurse Practitioner

## 2023-10-10 ENCOUNTER — Ambulatory Visit (INDEPENDENT_AMBULATORY_CARE_PROVIDER_SITE_OTHER): Admitting: Nurse Practitioner

## 2023-10-10 VITALS — BP 127/72 | HR 69 | Temp 97.9°F | Ht 66.0 in | Wt 252.2 lb

## 2023-10-10 DIAGNOSIS — Z952 Presence of prosthetic heart valve: Secondary | ICD-10-CM | POA: Diagnosis not present

## 2023-10-10 DIAGNOSIS — I482 Chronic atrial fibrillation, unspecified: Secondary | ICD-10-CM

## 2023-10-10 DIAGNOSIS — Z7901 Long term (current) use of anticoagulants: Secondary | ICD-10-CM | POA: Diagnosis not present

## 2023-10-10 DIAGNOSIS — D6869 Other thrombophilia: Secondary | ICD-10-CM

## 2023-10-10 LAB — COAGUCHEK XS/INR WAIVED
INR: 2.3 — ABNORMAL HIGH (ref 0.9–1.1)
Prothrombin Time: 27.1 s

## 2023-10-10 NOTE — Assessment & Plan Note (Signed)
 Chronic, ongoing with Coumadin  use.  Continue collaboration with cardiology, last note reviewed.  Continue all medications.

## 2023-10-10 NOTE — Assessment & Plan Note (Signed)
 Refer to mechanical valve plan of care.

## 2023-10-10 NOTE — Progress Notes (Signed)
   BP 127/72   Pulse 69   Temp 97.9 F (36.6 C) (Oral)   Ht 5\' 6"  (1.676 m)   Wt 252 lb 3.2 oz (114.4 kg)   SpO2 93%   BMI 40.71 kg/m    Subjective:    Patient ID: Rodney Coombs [name], male    DOB: 20-Jul-1960, 63 y.o.   MRN: [id number]  CC: Coumadin  management  HPI: This patient is a 63 y.o. male who presents for coumadin  management. The expected duration of coumadin  treatment is lifelong The reason for anticoagulation is  mechanical heart valve. Follows with cardiology, Dr. Parks Bollman, last on 03/17/2023, no changes. Echo 09/05/23 with LVEF 60-65%, stable valve -- moderate aortic valve stenosis. Stopped fish oil, which he had recently started, at last visit.  Present Coumadin  dose: held dose and reduced to 10 MG daily Goal: 2.5-3.5 The patient does not have an INR goal. Excessive bruising: no Nose bleeding: no Rectal bleeding: no Prolonged menstrual cycles: N/A Eating diet with consistent amounts of foods containing Vitamin K:no Any recent antibiotic use? no  Relevant past medical, surgical, family and social history reviewed and updated as indicated. Interim medical history since our last visit reviewed. Allergies and medications reviewed and updated.  ROS: Per HPI unless specifically indicated above     Objective:    BP 127/72   Pulse 69   Temp 97.9 F (36.6 C) (Oral)   Ht 5\' 6"  (1.676 m)   Wt 252 lb 3.2 oz (114.4 kg)   SpO2 93%   BMI 40.71 kg/m   Wt Readings from Last 3 Encounters:  10/10/23 252 lb 3.2 oz (114.4 kg)  10/03/23 249 lb 9.6 oz (113.2 kg)  09/25/23 254 lb (115.2 kg)     General: Well appearing, well nourished in no distress.  Normal mood and affect. Skin: No excessive bruising or rash  Last INR: 4.4    Last CBC:  Lab Results  Component Value Date   WBC 10.6 (H) 09/05/2023   HGB 15.4 09/05/2023   HCT 44.9 09/05/2023   MCV 82.7 09/05/2023   PLT 195 09/05/2023    Results for orders placed or performed in visit on 10/03/23  CoaguChek XS/INR  Waived   Collection Time: 10/03/23 11:01 AM  Result Value Ref Range   INR 4.4 (H) 0.9 - 1.1   Prothrombin Time 53.1 sec       Assessment:     ICD-10-CM   1. Chronic atrial fibrillation (HCC)  I48.20     2. Mechanical heart valve present  Z95.2 CoaguChek XS/INR Waived    3. Current use of long term anticoagulation  Z79.01 CoaguChek XS/INR Waived      Plan:   Discussed current plan face-to-face with patient. For coumadin  dosing, elected to change dose to 11 MG once a week and 10 MG remainder of days to = 1 MG increase. Will plan to recheck INR in 1 week.

## 2023-10-10 NOTE — Assessment & Plan Note (Signed)
 Chronic, ongoing with Coumadin  use. INR 2.3 today, improving without fish oil on board but on lower side.  Change Coumadin  to 11 MG once a week and then 10 MG remainder of days which suspect will get to goal level.  Not to take Ibuprofen due to risk for bleeding with this and only use Tylenol  or OTC Voltaren gel or Icy/Hot patches.  Remain off Fenofibrate .

## 2023-10-15 NOTE — Patient Instructions (Signed)
 Warfarin Life Changes Learn about lifestyle changes and extra care you need to take when you're taking warfarin.  To view the content, go to this web address: [email or web address]  This video will expire on: 04/19/2025. If you need access to this video following this date, please reach out to the healthcare provider who assigned it to you. This information is not intended to replace advice given to you by your health care provider. Make sure you discuss any questions you have with your health care provider. Elsevier Patient Education  2024 [employer].

## 2023-10-17 ENCOUNTER — Encounter: Payer: Self-pay | Admitting: Nurse Practitioner

## 2023-10-17 ENCOUNTER — Ambulatory Visit (INDEPENDENT_AMBULATORY_CARE_PROVIDER_SITE_OTHER): Admitting: Nurse Practitioner

## 2023-10-17 VITALS — BP 136/78 | HR 71 | Temp 98.9°F | Ht 66.0 in | Wt 252.4 lb

## 2023-10-17 DIAGNOSIS — Z952 Presence of prosthetic heart valve: Secondary | ICD-10-CM

## 2023-10-17 DIAGNOSIS — Z7901 Long term (current) use of anticoagulants: Secondary | ICD-10-CM | POA: Diagnosis not present

## 2023-10-17 DIAGNOSIS — I482 Chronic atrial fibrillation, unspecified: Secondary | ICD-10-CM | POA: Diagnosis not present

## 2023-10-17 LAB — COAGUCHEK XS/INR WAIVED
INR: 3.1 — ABNORMAL HIGH (ref 0.9–1.1)
Prothrombin Time: 37 s

## 2023-10-17 NOTE — Assessment & Plan Note (Signed)
 Chronic, ongoing with Coumadin  use.  Continue collaboration with cardiology, last note reviewed.  Continue all medications.

## 2023-10-17 NOTE — Progress Notes (Signed)
   BP 136/78 (BP Location: Left Arm, Patient Position: Sitting, Cuff Size: Large)   Pulse 71   Temp 98.9 F (37.2 C) (Oral)   Ht 5\' 6"  (1.676 m)   Wt 252 lb 6.4 oz (114.5 kg)   SpO2 94%   BMI 40.74 kg/m    Subjective:    Patient ID: Rodney Coombs [name], male    DOB: [birth year], 63 y.o.   MRN: [id number]  CC: Coumadin  management  HPI: This patient is a 63 y.o. male who presents for coumadin  management. The expected duration of coumadin  treatment is lifelong The reason for anticoagulation is  mechanical heart valve. Follows with cardiology, Dr. Parks Bollman, last on 03/17/2023, no changes. Echo 09/05/23 with LVEF 60-65%, stable valve -- moderate aortic valve stenosis.   Present Coumadin  dose: 11 MG once a week and 10 MG remainder of days Goal: 2.5-3.5 The patient does not have an INR goal. Excessive bruising: no Nose bleeding: no Rectal bleeding: no Prolonged menstrual cycles: N/A Eating diet with consistent amounts of foods containing Vitamin K:no Any recent antibiotic use? no  Relevant past medical, surgical, family and social history reviewed and updated as indicated. Interim medical history since our last visit reviewed. Allergies and medications reviewed and updated.  ROS: Per HPI unless specifically indicated above     Objective:    BP 136/78 (BP Location: Left Arm, Patient Position: Sitting, Cuff Size: Large)   Pulse 71   Temp 98.9 F (37.2 C) (Oral)   Ht 5\' 6"  (1.676 m)   Wt 252 lb 6.4 oz (114.5 kg)   SpO2 94%   BMI 40.74 kg/m   Wt Readings from Last 3 Encounters:  10/17/23 252 lb 6.4 oz (114.5 kg)  10/10/23 252 lb 3.2 oz (114.4 kg)  10/03/23 249 lb 9.6 oz (113.2 kg)     General: Well appearing, well nourished in no distress.  Normal mood and affect. Skin: No excessive bruising or rash  Last INR: 2.3    Last CBC:  Lab Results  Component Value Date   WBC 10.6 (H) 09/05/2023   HGB 15.4 09/05/2023   HCT 44.9 09/05/2023   MCV 82.7 09/05/2023   PLT 195  09/05/2023    Results for orders placed or performed in visit on 10/10/23  CoaguChek XS/INR Waived   Collection Time: 10/10/23  3:37 PM  Result Value Ref Range   INR 2.3 (H) 0.9 - 1.1   Prothrombin Time 27.1 sec       Assessment:     ICD-10-CM   1. Chronic atrial fibrillation (HCC)  I48.20 CoaguChek XS/INR Waived    2. Current use of long term anticoagulation  Z79.01 CoaguChek XS/INR Waived    3. Mechanical heart valve present  Z95.2 CoaguChek XS/INR Waived      Plan:   Discussed current plan face-to-face with patient. For coumadin  dosing, continue taking 11 MG once a week and 10 MG remainder of days. Will plan to recheck INR in 4 weeks.

## 2023-10-17 NOTE — Assessment & Plan Note (Signed)
 Chronic, ongoing with Coumadin  use. INR 3.1 today, improved and in range.  Continue Coumadin  11 MG once a week and 10 MG remainder of days at this time.  Not to take Ibuprofen due to risk for bleeding with this and only use Tylenol  or OTC Voltaren gel or Icy/Hot patches.  Remain off Fenofibrate  and fish oil.

## 2023-10-17 NOTE — Assessment & Plan Note (Signed)
 Refer to mechanical valve plan of care.

## 2023-10-19 ENCOUNTER — Other Ambulatory Visit: Payer: Self-pay | Admitting: Nurse Practitioner

## 2023-10-20 NOTE — Telephone Encounter (Signed)
 Requested medication (s) are due for refill today: yes  Requested medication (s) are on the active medication list: yes  Last refill:  08/25/23 #18 ml 1 RF  Future visit scheduled: yes  Notes to clinic:  abnormal results   Requested Prescriptions  Pending Prescriptions Disp Refills   Insulin  Glargine (BASAGLAR  KWIKPEN) 100 UNIT/ML [Pharmacy Med Name: BASAGLAR  100 UNIT/ML KWIKPEN]  1    Sig: INJECT 60 UNITS INTO THE SKIN DAILY     Endocrinology:  Diabetes - Insulins Failed - 10/20/2023 12:58 PM      Failed - HBA1C is between 0 and 7.9 and within 180 days    Hemoglobin A1C  Date Value Ref Range Status  12/16/2015 10.4  Final   HB A1C (BAYER DCA - WAIVED)  Date Value Ref Range Status  04/28/2023 11.6 (H) 4.8 - 5.6 % Final    Comment:             Prediabetes: 5.7 - 6.4          Diabetes: >6.4          Glycemic control for adults with diabetes: <7.0    Hgb A1c MFr Bld  Date Value Ref Range Status  09/04/2023 12.3 (H) 4.8 - 5.6 % Final    Comment:    (NOTE) Pre diabetes:          5.7%-6.4%  Diabetes:              >6.4%  Glycemic control for   <7.0% adults with diabetes          Passed - Valid encounter within last 6 months    Recent Outpatient Visits           3 days ago Chronic atrial fibrillation (HCC)   [facility] [place], Sanjuan Crumbly T, NP   1 week ago Chronic atrial fibrillation (HCC)   [facility], Sanjuan Crumbly T, NP   2 weeks ago Chronic atrial fibrillation (HCC)   [facility] [place], Sanjuan Crumbly T, NP   3 weeks ago Chronic atrial fibrillation (HCC)   [facility] [place], Sanjuan Crumbly T, NP   1 month ago Chronic atrial fibrillation [facility] [place]   [facility] [place], Lavelle Posey, NP

## 2023-10-23 ENCOUNTER — Other Ambulatory Visit: Payer: Self-pay | Admitting: Nurse Practitioner

## 2023-10-25 NOTE — Telephone Encounter (Signed)
 Requested medication (s) are due for refill today: Yes  Requested medication (s) are on the active medication list: No  Last refill:  06/07/23  Future visit scheduled: Yes  Notes to clinic:  Stop taking at discharge, routing to provider for approval/denial     Requested Prescriptions  Pending Prescriptions Disp Refills   UREA  10 HYDRATING 10 % cream [Pharmacy Med Name: UREA  10% CREAM] 85 g     Sig: Apply topically as needed (to both heels on feet).     Off-Protocol Failed - 10/25/2023 11:27 AM      Failed - Medication not assigned to a protocol, review manually.      Passed - Valid encounter within last 12 months    Recent Outpatient Visits           1 week ago Chronic atrial fibrillation (HCC)   [place] [facility] [place], Sanjuan Crumbly T, NP   2 weeks ago Chronic atrial fibrillation (HCC)   [place] [facility] [place], Sanjuan Crumbly T, NP   3 weeks ago Chronic atrial fibrillation (HCC)   [place] [facility] [place], Sanjuan Crumbly T, NP   1 month ago Chronic atrial fibrillation [facility])   [place] [facility] [place], Sanjuan Crumbly T, NP   1 month ago Chronic atrial fibrillation [facility])   [place] [facility] [place] Pyles, NP             Dermatology:  Other Passed - 10/25/2023 11:27 AM      Passed - Valid encounter within last 12 months    Recent Outpatient Visits           1 week ago Chronic atrial fibrillation (HCC)   [place] [facility] [place], Sanjuan Crumbly T, NP   2 weeks ago Chronic atrial fibrillation (HCC)   [place] [facility] [place], Sanjuan Crumbly T, NP   3 weeks ago Chronic atrial fibrillation (HCC)   [place] [facility] [place], Sanjuan Crumbly T, NP   1 month ago Chronic atrial fibrillation (HCC)   [place] [facility] [place], Sanjuan Crumbly T, NP   1 month ago Chronic atrial fibrillation [facility])    [facility] [place], Lavelle Posey, NP

## 2023-11-08 LAB — HM DIABETES EYE EXAM

## 2023-11-09 ENCOUNTER — Other Ambulatory Visit: Payer: Self-pay

## 2023-11-09 NOTE — Progress Notes (Signed)
   11/09/2023  Patient ID: Rodney ONEIDA Kobus [name], male   DOB: May 28, 1960, 63 y.o.   MRN: [id number]  Subjective/Objective Telephone visit to follow-up on management of chronic conditions   Diabetes -Current medications:  Fiasp  16 units TID with meals unless BG < 120, Basaglar  60 units daily, Jardiance  25mg  daily -Patient is monitoring FBG daily and states this is averaging 66-150, which has improved greatly -Jardiance  25mg  PA was approved, and patient has been able to consistently refill this medication -Does continue to endorse challenges from time to time with [facility] being able to get basaglar  in -Does not endorse any s/sx of hypoglycemia or hyperglycemia -A1c 12.3% in May- up from 11.6% previously -Saw endocrinology 7/1, and no changes were med to medications; but they have scheduled patient for a follow-up and A1c in August   Hypertension -Current medications:  amlodipine  5mg  daily, benazepril  40mg  daily, carvedilol  25mg  BID, hydrochlorothiazide  25mg  daily -Patient was provided BP monitor at 5/19 PCP visit and has been checking home BP regularly- endorses readings typically 130/80  -BP 120/80 at endocrinology visit 7/1   Assessment/Plan   Diabetes  -Uncontrolled based on A1c, but current FBG reflects improved control -Continue current regimen at this time -Patient will contact me if he continues to have issues getting basaglar  (or any medications) filled -Continue to monitor BG regularly   Hypertension -Controlled  -Continue current regimen at this time -Monitor and record home BP at least 3x/week   Follow-up:  2 months   Rodney Dixon, PharmD, DPLA

## 2023-11-11 NOTE — Patient Instructions (Signed)

## 2023-11-13 ENCOUNTER — Encounter: Payer: Self-pay | Admitting: Nurse Practitioner

## 2023-11-14 ENCOUNTER — Encounter: Payer: Self-pay | Admitting: Nurse Practitioner

## 2023-11-14 ENCOUNTER — Ambulatory Visit (INDEPENDENT_AMBULATORY_CARE_PROVIDER_SITE_OTHER): Admitting: Nurse Practitioner

## 2023-11-14 VITALS — BP 130/68 | HR 73 | Temp 98.2°F | Ht 66.0 in | Wt 248.0 lb

## 2023-11-14 DIAGNOSIS — D6869 Other thrombophilia: Secondary | ICD-10-CM

## 2023-11-14 DIAGNOSIS — Z7901 Long term (current) use of anticoagulants: Secondary | ICD-10-CM | POA: Diagnosis not present

## 2023-11-14 DIAGNOSIS — Z952 Presence of prosthetic heart valve: Secondary | ICD-10-CM | POA: Diagnosis not present

## 2023-11-14 DIAGNOSIS — Z7985 Long-term (current) use of injectable non-insulin antidiabetic drugs: Secondary | ICD-10-CM

## 2023-11-14 DIAGNOSIS — E1129 Type 2 diabetes mellitus with other diabetic kidney complication: Secondary | ICD-10-CM

## 2023-11-14 DIAGNOSIS — R809 Proteinuria, unspecified: Secondary | ICD-10-CM

## 2023-11-14 LAB — MICROALBUMIN, URINE WAIVED
Creatinine, Urine Waived: 50 mg/dL (ref 10–300)
Microalb, Ur Waived: 10 mg/L (ref 0–19)

## 2023-11-14 LAB — COAGUCHEK XS/INR WAIVED
INR: 3 — ABNORMAL HIGH (ref 0.9–1.1)
Prothrombin Time: 35.8 s

## 2023-11-14 NOTE — Assessment & Plan Note (Signed)
 Refer to mechanical valve plan of care.

## 2023-11-14 NOTE — Progress Notes (Signed)
   BP 130/68   Pulse 73   Temp 98.2 F (36.8 C) (Oral)   Ht 5' 6 (1.676 m)   Wt 248 lb (112.5 kg)   SpO2 94%   BMI 40.03 kg/m    Subjective:    Patient ID: Rodney ONEIDA Kobus [name], male    DOB: May 15, 1960, 63 y.o.   MRN: [id number]  CC: Coumadin  management  HPI: This patient is a 63 y.o. male who presents for coumadin  management. The expected duration of coumadin  treatment is lifelong The reason for anticoagulation is  mechanical heart valve. Follows with cardiology, Dr. Ammon, last on 03/17/2023, no changes. Echo 09/05/23 with LVEF 60-65%, stable valve -- moderate aortic valve stenosis. Saw endocrinology 11/07/23 for initial visit, was frustrated as she did not check labs or change anything.  Present Coumadin  dose: 11 MG once a week and 10 MG remainder of days Goal: 2.5-3.5 The patient does not have an INR goal. Excessive bruising: no Nose bleeding: no Rectal bleeding: no Prolonged menstrual cycles: N/A Eating diet with consistent amounts of foods containing Vitamin K:no Any recent antibiotic use? no  Relevant past medical, surgical, family and social history reviewed and updated as indicated. Interim medical history since our last visit reviewed. Allergies and medications reviewed and updated.  ROS: Per HPI unless specifically indicated above     Objective:    BP 130/68   Pulse 73   Temp 98.2 F (36.8 C) (Oral)   Ht 5' 6 (1.676 m)   Wt 248 lb (112.5 kg)   SpO2 94%   BMI 40.03 kg/m   Wt Readings from Last 3 Encounters:  11/14/23 248 lb (112.5 kg)  10/17/23 252 lb 6.4 oz (114.5 kg)  10/10/23 252 lb 3.2 oz (114.4 kg)     General: Well appearing, well nourished in no distress.  Normal mood and affect. Skin: No excessive bruising or rash  Last INR: 3.1    Last CBC:  Lab Results  Component Value Date   WBC 10.6 (H) 09/05/2023   HGB 15.4 09/05/2023   HCT 44.9 09/05/2023   MCV 82.7 09/05/2023   PLT 195 09/05/2023    Results for orders placed or performed in  visit on 11/13/23  HM DIABETES EYE EXAM   Collection Time: 11/08/23 12:00 AM  Result Value Ref Range   HM Diabetic Eye Exam Retinopathy (A) No Retinopathy       Assessment:     ICD-10-CM   1. Type 2 diabetes mellitus with proteinuria (HCC)  E11.29 Microalbumin, Urine Waived   R80.9     2. Acquired thrombophilia (HCC)  D68.69     3. Mechanical heart valve present  Z95.2 CoaguChek XS/INR Waived    4. Current use of long term anticoagulation  Z79.01 CoaguChek XS/INR Waived      Plan:   Discussed current plan face-to-face with patient. For coumadin  dosing, continue taking 11 MG once a week and 10 MG remainder of days. Will plan to recheck INR in 4 weeks.

## 2023-11-14 NOTE — Assessment & Plan Note (Addendum)
 Chronic, ongoing with Coumadin  use. INR 3.0 today, in range.  Continue Coumadin  11 MG once a week and 10 MG remainder of days at this time.  Not to take Ibuprofen due to risk for bleeding with this and only use Tylenol  or OTC Voltaren gel or Icy/Hot patches.  Remain off Fenofibrate  and fish oil.

## 2023-11-14 NOTE — Assessment & Plan Note (Signed)
 Chronic, ongoing.  Poorly controlled due to non adherence and poor diet.  A1c recent 12.3%.  Following with endocrinology at present and will give them one more try.  Losing weight.  Cannot take Metformin  as caused reduced kidney function. Cannot take Glipizide due to risk for interaction with Coumadin . For now continue current regimen + collaboration with [facility] PharmD.  Jardiance  offered benefit to BP, but cannot afford. Discussed at length his very high risk with poor diabetes control - we have had multiple discussions on this.   - Refuses vaccinations - ACE and statin on board - Eye exam up to date.  Foot exam up to date. - Urine ALB today.

## 2023-11-15 ENCOUNTER — Telehealth: Payer: Self-pay

## 2023-12-24 NOTE — Patient Instructions (Incomplete)

## 2023-12-26 NOTE — Telephone Encounter (Signed)
 error

## 2023-12-28 ENCOUNTER — Ambulatory Visit: Admitting: Nurse Practitioner

## 2023-12-28 DIAGNOSIS — I482 Chronic atrial fibrillation, unspecified: Secondary | ICD-10-CM

## 2023-12-28 DIAGNOSIS — E1129 Type 2 diabetes mellitus with other diabetic kidney complication: Secondary | ICD-10-CM

## 2023-12-28 DIAGNOSIS — E119 Type 2 diabetes mellitus without complications: Secondary | ICD-10-CM

## 2023-12-28 DIAGNOSIS — Z952 Presence of prosthetic heart valve: Secondary | ICD-10-CM

## 2023-12-28 DIAGNOSIS — Z7901 Long term (current) use of anticoagulants: Secondary | ICD-10-CM

## 2023-12-28 DIAGNOSIS — E1169 Type 2 diabetes mellitus with other specified complication: Secondary | ICD-10-CM

## 2023-12-28 DIAGNOSIS — E66813 Obesity, class 3: Secondary | ICD-10-CM

## 2023-12-28 DIAGNOSIS — E1159 Type 2 diabetes mellitus with other circulatory complications: Secondary | ICD-10-CM

## 2023-12-28 DIAGNOSIS — E1142 Type 2 diabetes mellitus with diabetic polyneuropathy: Secondary | ICD-10-CM

## 2023-12-28 DIAGNOSIS — D6869 Other thrombophilia: Secondary | ICD-10-CM

## 2023-12-28 DIAGNOSIS — Z8673 Personal history of transient ischemic attack (TIA), and cerebral infarction without residual deficits: Secondary | ICD-10-CM

## 2023-12-28 DIAGNOSIS — E1165 Type 2 diabetes mellitus with hyperglycemia: Secondary | ICD-10-CM

## 2024-01-01 ENCOUNTER — Ambulatory Visit: Payer: 59 | Admitting: Podiatry

## 2024-01-10 ENCOUNTER — Other Ambulatory Visit: Payer: Self-pay

## 2024-01-10 DIAGNOSIS — E1142 Type 2 diabetes mellitus with diabetic polyneuropathy: Secondary | ICD-10-CM

## 2024-01-10 NOTE — Progress Notes (Signed)
   01/10/2024  Patient ID: Rodney ONEIDA Kobus [name], male   DOB: 11/16/1960, 63 y.o.   MRN: [id number]  Subjective/Objective Telephone visit to follow-up on management of chronic conditions   Diabetes -Current medications:  Fiasp  16 units TID with meals unless BG < 120 (skip), Basaglar  60 units daily (55 units if FBG <70), Jardiance  25mg  daily -Patient is monitoring FBG daily and states this was 73 this morning, which has improved greatly -Jardiance  25mg  PA was approved, and patient has been able to consistently refill this medication -Does not endorse any s/sx of hypoglycemia or hyperglycemia -A1c of 7.8% recently, down from 12.3% in May -Saw endocrinology again 8/7, and no changes were made to medications   Hypertension -Current medications:  amlodipine  5mg  daily, benazepril  40mg  daily, carvedilol  25mg  BID, hydrochlorothiazide  25mg  daily -Patient was provided BP monitor at 5/19 PCP visit, but he has not checked home BP since our last visit in July -BP 118/78 at endocrinology visit 8/7   Assessment/Plan   Diabetes  -Improved control -Continue current regimen at this time -Continue to monitor BG regularly -Follow-up with endocrinology 11/12    Hypertension -Controlled  -Continue current regimen at this time -Monitor and record home BP at least 3x/week   Follow-up:  11/5   Rodney Dixon, PharmD, DPLA

## 2024-01-20 NOTE — Patient Instructions (Signed)

## 2024-01-23 ENCOUNTER — Encounter: Payer: Self-pay | Admitting: Nurse Practitioner

## 2024-01-23 ENCOUNTER — Ambulatory Visit (INDEPENDENT_AMBULATORY_CARE_PROVIDER_SITE_OTHER): Admitting: Nurse Practitioner

## 2024-01-23 VITALS — BP 136/74 | HR 80 | Temp 98.0°F | Resp 18 | Ht 66.0 in | Wt 248.0 lb

## 2024-01-23 DIAGNOSIS — L03116 Cellulitis of left lower limb: Secondary | ICD-10-CM | POA: Insufficient documentation

## 2024-01-23 DIAGNOSIS — E1165 Type 2 diabetes mellitus with hyperglycemia: Secondary | ICD-10-CM

## 2024-01-23 DIAGNOSIS — E1129 Type 2 diabetes mellitus with other diabetic kidney complication: Secondary | ICD-10-CM | POA: Diagnosis not present

## 2024-01-23 DIAGNOSIS — Z7901 Long term (current) use of anticoagulants: Secondary | ICD-10-CM

## 2024-01-23 DIAGNOSIS — I482 Chronic atrial fibrillation, unspecified: Secondary | ICD-10-CM

## 2024-01-23 DIAGNOSIS — E1169 Type 2 diabetes mellitus with other specified complication: Secondary | ICD-10-CM

## 2024-01-23 DIAGNOSIS — E119 Type 2 diabetes mellitus without complications: Secondary | ICD-10-CM | POA: Diagnosis not present

## 2024-01-23 DIAGNOSIS — D6869 Other thrombophilia: Secondary | ICD-10-CM

## 2024-01-23 DIAGNOSIS — Z952 Presence of prosthetic heart valve: Secondary | ICD-10-CM

## 2024-01-23 DIAGNOSIS — E66813 Obesity, class 3: Secondary | ICD-10-CM

## 2024-01-23 DIAGNOSIS — E1159 Type 2 diabetes mellitus with other circulatory complications: Secondary | ICD-10-CM

## 2024-01-23 DIAGNOSIS — I152 Hypertension secondary to endocrine disorders: Secondary | ICD-10-CM

## 2024-01-23 DIAGNOSIS — E1142 Type 2 diabetes mellitus with diabetic polyneuropathy: Secondary | ICD-10-CM | POA: Diagnosis not present

## 2024-01-23 LAB — COAGUCHEK XS/INR WAIVED
INR: 2.8 — ABNORMAL HIGH (ref 0.9–1.1)
Prothrombin Time: 33.2 s

## 2024-01-23 MED ORDER — MUPIROCIN 2 % EX OINT: 1.0000 | TOPICAL_OINTMENT | Freq: Two times a day (BID) | CUTANEOUS | 3 refills | Status: AC

## 2024-01-23 MED ORDER — CEPHALEXIN 500 MG PO CAPS: 500.0000 mg | ORAL_CAPSULE | Freq: Three times a day (TID) | ORAL | 0 refills | Status: DC

## 2024-01-23 NOTE — Progress Notes (Signed)
 BP 136/74 (BP Location: Left Arm, Patient Position: Sitting, Cuff Size: Large)   Pulse 80   Temp 98 F (36.7 C) (Oral)   Resp 18   Ht 5' 6 (1.676 m)   Wt 248 lb (112.5 kg)   SpO2 98%   BMI 40.03 kg/m    Subjective:    Patient ID: Rodney ONEIDA Kobus [name], male    DOB: Jan 17, 1961, 63 y.o.   MRN: [id number]  HPI: Rodney Windt. is a 63 y.o. male  Chief Complaint  Patient presents with   HTN/HLD    Sometimes checks at home and always good.    Coagulation Disorder   DIABETES A1c in August 7.8% with endo, last visit 12/14/23.  Using Basaglar  60 units, Humalog  5 units as needed before meals, Jardiance .  Takes Gabapentin , taking one tablet at night.  Has been trying to reduce amount taken.   Hypoglycemic episodes: no Polydipsia/polyuria: no Visual disturbance: no Chest pain: no Paresthesias: no Glucose Monitoring: yes  Accucheck frequency: not checking  Fasting glucose:   Post prandial:  Evening:  Before meals: Taking Insulin ?: yes NPH 60 units  Long acting insulin :  Short acting insulin :  Blood Pressure Monitoring: not checking Retinal Examination: Not up to Date Foot Exam: Up to Date Pneumovax: Up to Date Influenza: Not Up To Date Aspirin : no   SKIN INFECTION Has wound to lower left, lateral leg for past 2 weeks.  Does not know what happened, no injuries.  Is having some discomfort. Is improving per patient. Duration: weeks Location: as above History of trauma in area: unknown Pain: yes Quality: yes Severity: 6/10 Redness: yes Swelling: yes Oozing: yes Pus: no Fevers: no Nausea/vomiting: no Status: improving some Treatments attempted: Mupirocin   Tetanus: UTD   HYPERTENSION / HYPERLIPIDEMIA Taking Benazepril , HCTZ, Amlodipine , Carvedilol , and Atorvastatin . Echo noted normal LV function and EF >55% 03/13/2020. Last cardiology visit was on 03/17/23. Duration of hypertension: chronic BP monitoring frequency: not checking BP range:  BP medication side effects:  no Duration of hyperlipidemia: chronic Cholesterol medication side effects: no Cholesterol supplements: none Medication compliance: good compliance Aspirin : no Recent stressors: no Recurrent headaches: no Visual changes: no Palpitations: no Dyspnea: no Chest pain: no Lower extremity edema: no Dizzy/lightheaded: yes  ATRIAL FIBRILLATION Takes Coumadin  11 MG once a week and 10 MG remainder of days  History of stroke, has not seen neurology since 03/30/21.  CT remained reassuring on 05/12/21. Has intermittent dizziness with this. Atrial fibrillation status: stable Satisfied with current treatment: yes  Medication side effects:  no Medication compliance: good compliance Etiology of atrial fibrillation: unknown Palpitations:  no Chest pain:  no Dyspnea on exertion:  no Orthopnea:  no Syncope:  no Edema:  no Ventricular rate control: B-blocker Anti-coagulation: warfarin   Relevant past medical, surgical, family and social history reviewed and updated as indicated. Interim medical history since our last visit reviewed. Allergies and medications reviewed and updated.  Review of Systems  Constitutional:  Negative for activity change, diaphoresis, fatigue and fever.  Respiratory:  Negative for cough, chest tightness, shortness of breath and wheezing.   Cardiovascular:  Negative for chest pain, palpitations and leg swelling.  Gastrointestinal: Negative.   Endocrine: Negative for polydipsia, polyphagia and polyuria.  Genitourinary:  Negative for urgency.  Neurological:  Negative for dizziness, facial asymmetry, speech difficulty, weakness and headaches.  Psychiatric/Behavioral: Negative.     Per HPI unless specifically indicated above     Objective:    BP 136/74 (BP Location: Left  Arm, Patient Position: Sitting, Cuff Size: Large)   Pulse 80   Temp 98 F (36.7 C) (Oral)   Resp 18   Ht 5' 6 (1.676 m)   Wt 248 lb (112.5 kg)   SpO2 98%   BMI 40.03 kg/m   Wt Readings from  Last 3 Encounters:  01/23/24 248 lb (112.5 kg)  11/14/23 248 lb (112.5 kg)  10/17/23 252 lb 6.4 oz (114.5 kg)    Physical Exam Vitals and nursing note reviewed.  Constitutional:      General: He is awake. He is not in acute distress.    Appearance: He is well-developed and well-groomed. He is morbidly obese. He is not ill-appearing.  HENT:     Head: Normocephalic and atraumatic.     Right Ear: Hearing normal. No drainage.     Left Ear: Hearing normal. No drainage.  Eyes:     General: Lids are normal.        Right eye: No discharge.        Left eye: No discharge.     Conjunctiva/sclera: Conjunctivae normal.     Pupils: Pupils are equal, round, and reactive to light.  Neck:     Thyroid : No thyromegaly.     Vascular: No carotid bruit or JVD.  Cardiovascular:     Rate and Rhythm: Normal rate and regular rhythm.     Heart sounds: Normal heart sounds, S1 normal and S2 normal. No murmur heard.    No gallop.  Pulmonary:     Effort: Pulmonary effort is normal. No accessory muscle usage or respiratory distress.     Breath sounds: Normal breath sounds.  Abdominal:     General: Bowel sounds are normal.     Palpations: Abdomen is soft.  Musculoskeletal:        General: Normal range of motion.     Right shoulder: Normal.     Left shoulder: Normal.     Cervical back: Normal range of motion and neck supple.     Right lower leg: No edema.     Left lower leg: No edema.  Skin:    General: Skin is warm and dry.     Capillary Refill: Capillary refill takes less than 2 seconds.     Findings: Wound present. No rash.      Neurological:     Mental Status: He is alert and oriented to person, place, and time.     Cranial Nerves: Cranial nerves 2-12 are intact.     Motor: Motor function is intact.     Coordination: Coordination is intact.     Gait: Gait is intact.     Deep Tendon Reflexes: Reflexes are normal and symmetric.  Psychiatric:        Attention and Perception: Attention normal.         Mood and Affect: Mood normal.        Speech: Speech normal.        Behavior: Behavior normal. Behavior is cooperative.        Thought Content: Thought content normal.    Results for orders placed or performed in visit on 01/23/24  CoaguChek XS/INR Waived   Collection Time: 01/23/24  4:03 PM  Result Value Ref Range   INR 2.8 (H) 0.9 - 1.1   Prothrombin Time 33.2 sec      Assessment & Plan:   Problem List Items Addressed This Visit       Cardiovascular and Mediastinum   Chronic atrial fibrillation (  HCC) (Chronic)   Chronic, ongoing with Coumadin  use.  Continue collaboration with cardiology, last note reviewed.  Continue all medications.      Hypertension associated with diabetes (HCC)   Chronic, ongoing.  BP at goal today. Continue Benzapril, HCTZ, Coreg  + Amlodipine , educated him on these medications and use + side effects.  Highly recommend he monitor BP at home at least a few mornings a week + focus on DASH diet.  Discussed with him stroke prevention goal for BP <130/80.  LABS: CMP.  Continue cardiology collaboration.  [facility] to collaborate with [facility].          Endocrine   Type 2 diabetes mellitus with proteinuria (HCC) - Primary (Chronic)   Chronic, ongoing.  Poorly controlled due to non adherence and poor diet, however August endo visit A1c was coming down at 7.8%.  Cannot take Metformin  as caused reduced kidney function. Cannot take Glipizide due to risk for interaction with Coumadin . For now continue current regimen + collaboration with [facility] PharmD.  Jardiance  offers benefit to BP. Discussed at length his very high risk with poor diabetes control - we have had multiple discussions on this.   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  Poorly controlled due to non adherence and poor diet, however August endo visit A1c was coming  down at 7.8%.  Cannot take Metformin  as caused reduced kidney function. Cannot take Glipizide due to risk for interaction with Coumadin . For now continue current regimen + collaboration with [facility] PharmD.  Jardiance  offers benefit to BP. Discussed at length his very high risk with poor diabetes control - we have had multiple discussions on this.  Continue Gabapentin , he is self reducing. - Refuses vaccinations - ACE and statin on board - Eye and foot exams up to date      Hyperlipidemia associated with type 2 diabetes mellitus (HCC) (Chronic)   Chronic, ongoing.  Continue current medication regimen and adjust as needed.  Lipid panel today.      Relevant Orders   Comprehensive metabolic panel with GFR   Lipid Panel w/o Chol/HDL Ratio   Uncontrolled type 2 diabetes mellitus with hyperglycemia, with long-term current use of insulin  (HCC)   Chronic, ongoing.  Poorly controlled due to non adherence and poor diet, however August endo visit A1c was coming down at 7.8%.  Cannot take Metformin  as caused reduced kidney function. Cannot take Glipizide due to risk for interaction with Coumadin . For now continue current regimen + collaboration with [facility] PharmD.  Jardiance  offers benefit to BP. Discussed at length his very high risk with poor diabetes control - we have had multiple discussions on this.   - Refuses vaccinations - ACE and statin on board - Eye and foot exams up to date      Insulin  dependent type 2 diabetes mellitus (HCC)   Chronic, ongoing.  Poorly controlled due to non adherence and poor diet, however August endo visit A1c was coming down at 7.8%.  Cannot take Metformin  as caused reduced kidney function. Cannot take Glipizide due to risk for interaction with Coumadin . For now continue current regimen + collaboration with [facility] PharmD.  Jardiance  offers benefit to BP. Discussed at length his very high risk with poor diabetes control - we have had multiple discussions on this.   - Refuses  vaccinations - ACE and  statin on board - Eye and foot exams up to date        Hematopoietic and Hemostatic   Acquired thrombophilia (HCC) (Chronic)   Monitor CBC with use of daily Coumadin  for mechanical valve and a-fib.  Denies any current bleeding episodes.  Check CBC annually, December.        Other   Current use of long term anticoagulation (Chronic)   Refer to mechanical valve plan of care.      Relevant Orders   CoaguChek XS/INR Waived (Completed)   Obesity, Class III, BMI 40-49.9 (morbid obesity)   BMI 42.40 with T2DM, HTN/HLD.  Recommended eating smaller high protein, low fat meals more frequently and exercising 30 mins a day 5 times a week with a goal of 10-15lb weight loss in the next 3 months. Patient voiced their understanding and motivation to adhere to these recommendations.       Mechanical heart valve present   Chronic, ongoing with Coumadin  use. INR 2.8 today, in range.  Continue Coumadin  11 MG once a week and 10 MG remainder of days at this time.  Not to take Ibuprofen due to risk for bleeding with this and only use Tylenol  or OTC Voltaren gel or Icy/Hot patches.  Remain off Fenofibrate  and fish oil. Starting abx therapy for wound will need to monitor INR closely, discussed with patient.      Relevant Orders   CoaguChek XS/INR Waived (Completed)   Cellulitis of left leg   Acute for 2 weeks and they report this is impoving, however he is higher risk and symptoms do remain.  Will start Keflex  TID for 7 days + Mupirocin  ointment to apply to wound.  Recommend they mark around area with a pen when they get home and if an extension of redness to immediately go to ER or alert PCP.        Follow up plan: Return in about 1 week (around 01/30/2024) for WOUND CHECK AND INR CHECK.

## 2024-01-23 NOTE — Assessment & Plan Note (Signed)
 Chronic, ongoing.  Poorly controlled due to non adherence and poor diet, however August endo visit A1c was coming down at 7.8%.  Cannot take Metformin  as caused reduced kidney function. Cannot take Glipizide due to risk for interaction with Coumadin . For now continue current regimen + collaboration with Rodney Dixon.  Jardiance  offers benefit to BP. Discussed at length his very high risk with poor diabetes control - we have had multiple discussions on this.   - Refuses vaccinations - ACE and statin on board - Eye and foot exams up to date

## 2024-01-23 NOTE — Assessment & Plan Note (Signed)
 Chronic, ongoing.  Poorly controlled due to non adherence and poor diet, however August endo visit A1c was coming down at 7.8%.  Cannot take Metformin  as caused reduced kidney function. Cannot take Glipizide due to risk for interaction with Coumadin . For now continue current regimen + collaboration with [facility] PharmD.  Jardiance  offers benefit to BP. Discussed at length his very high risk with poor diabetes control - we have had multiple discussions on this.  Continue Gabapentin , he is self reducing. - Refuses vaccinations - ACE and statin on board - Eye and foot exams up to date

## 2024-01-23 NOTE — Assessment & Plan Note (Signed)
 Chronic, ongoing with Coumadin  use.  Continue collaboration with cardiology, last note reviewed.  Continue all medications.

## 2024-01-23 NOTE — Assessment & Plan Note (Signed)
 Chronic, ongoing.  Poorly controlled due to non adherence and poor diet, however August endo visit A1c was coming down at 7.8%.  Cannot take Metformin  as caused reduced kidney function. Cannot take Glipizide due to risk for interaction with Coumadin . For now continue current regimen + collaboration with [facility] PharmD.  Jardiance  offers benefit to BP. Discussed at length his very high risk with poor diabetes control - we have had multiple discussions on this.   - Refuses vaccinations - ACE and statin on board - Eye and foot exams up to date

## 2024-01-23 NOTE — Assessment & Plan Note (Signed)
 Refer to mechanical valve plan of care.

## 2024-01-23 NOTE — Assessment & Plan Note (Signed)
 Monitor CBC with use of daily Coumadin for mechanical valve and a-fib.  Denies any current bleeding episodes.  Check CBC annually, December.

## 2024-01-23 NOTE — Assessment & Plan Note (Signed)
 Chronic, ongoing.  BP at goal today. Continue Benzapril, HCTZ, Coreg  + Amlodipine , educated him on these medications and use + side effects.  Highly recommend he monitor BP at home at least a few mornings a week + focus on DASH diet.  Discussed with him stroke prevention goal for BP <130/80.  LABS: CMP.  Continue cardiology collaboration.  Continue to collaborate with [facility] PharmD.

## 2024-01-23 NOTE — Assessment & Plan Note (Signed)
 BMI 42.40 with T2DM, HTN/HLD.  Recommended eating smaller high protein, low fat meals more frequently and exercising 30 mins a day 5 times a week with a goal of 10-15lb weight loss in the next 3 months. Patient voiced their understanding and motivation to adhere to these recommendations.

## 2024-01-23 NOTE — Assessment & Plan Note (Addendum)
 Chronic, ongoing.  Poorly controlled due to non adherence and poor diet, however August endo visit A1c was coming down at 7.8%.  Cannot take Metformin  as caused reduced kidney function. Cannot take Glipizide due to risk for interaction with Coumadin . For now continue current regimen + collaboration with [facility] PharmD.  Jardiance  offers benefit to BP. Discussed at length his very high risk with poor diabetes control - we have had multiple discussions on this.   - Refuses vaccinations - ACE and statin on board - Eye and foot exams up to date

## 2024-01-23 NOTE — Assessment & Plan Note (Signed)
 Acute for 2 weeks and they report this is impoving, however he is higher risk and symptoms do remain.  Will start Keflex  TID for 7 days + Mupirocin  ointment to apply to wound.  Recommend they mark around area with a pen when they get home and if an extension of redness to immediately go to ER or alert PCP.

## 2024-01-23 NOTE — Assessment & Plan Note (Signed)
 Chronic, ongoing.  Continue current medication regimen and adjust as needed. Lipid panel today.

## 2024-01-23 NOTE — Assessment & Plan Note (Signed)
 Chronic, ongoing with Coumadin  use. INR 2.8 today, in range.  Continue Coumadin  11 MG once a week and 10 MG remainder of days at this time.  Not to take Ibuprofen due to risk for bleeding with this and only use Tylenol  or OTC Voltaren gel or Icy/Hot patches.  Remain off Fenofibrate  and fish oil. Starting abx therapy for wound will need to monitor INR closely, discussed with patient.

## 2024-01-24 ENCOUNTER — Ambulatory Visit: Payer: Self-pay | Admitting: Nurse Practitioner

## 2024-01-24 LAB — COMPREHENSIVE METABOLIC PANEL WITH GFR
ALT: 29 IU/L (ref 0–44)
AST: 19 IU/L (ref 0–40)
Albumin: 4.4 g/dL (ref 3.9–4.9)
Alkaline Phosphatase: 86 IU/L (ref 47–123)
BUN/Creatinine Ratio: 19 (ref 10–24)
BUN: 21 mg/dL (ref 8–27)
Bilirubin Total: 0.5 mg/dL (ref 0.0–1.2)
CO2: 25 mmol/L (ref 20–29)
Calcium: 9.5 mg/dL (ref 8.6–10.2)
Chloride: 100 mmol/L (ref 96–106)
Creatinine, Ser: 1.12 mg/dL (ref 0.76–1.27)
Globulin, Total: 3 g/dL (ref 1.5–4.5)
Glucose: 129 mg/dL — ABNORMAL HIGH (ref 70–99)
Potassium: 4.3 mmol/L (ref 3.5–5.2)
Sodium: 138 mmol/L (ref 134–144)
Total Protein: 7.4 g/dL (ref 6.0–8.5)
eGFR: 74 mL/min/1.73 (ref >59)

## 2024-01-24 LAB — LIPID PANEL W/O CHOL/HDL RATIO
Cholesterol, Total: 100 mg/dL (ref 100–199)
HDL: 26 mg/dL — ABNORMAL LOW (ref >39)
LDL Chol Calc (NIH): 47 mg/dL (ref 0–99)
Triglycerides: 156 mg/dL — ABNORMAL HIGH (ref 0–149)
VLDL Cholesterol Cal: 27 mg/dL (ref 5–40)

## 2024-01-24 NOTE — Progress Notes (Signed)
 Good morning, please let Rodney Dixon know his labs have returned: - Lipid panel shows LDL at goal.  Continue Atorvastatin .  Your triglycerides are even coming down, I would recommend you continue your diet changes. - Kidney and liver function are normal.  Any questions? Keep being stellar!!  Thank you for allowing me to participate in your care.  I appreciate you. Kindest regards, Margaux Engen

## 2024-01-25 ENCOUNTER — Other Ambulatory Visit: Payer: Self-pay | Admitting: Nurse Practitioner

## 2024-01-25 DIAGNOSIS — E1159 Type 2 diabetes mellitus with other circulatory complications: Secondary | ICD-10-CM

## 2024-01-25 NOTE — Telephone Encounter (Signed)
 Requested Prescriptions  Pending Prescriptions Disp Refills   carvedilol  (COREG ) 25 MG tablet [Pharmacy Med Name: CARVEDILOL  25 MG TABLET] 180 tablet 0    Sig: TAKE 1 TABLET (25 MG TOTAL) BY MOUTH TWICE A DAY WITH MEALS     Cardiovascular: Beta Blockers 3 Passed - 01/25/2024  4:04 PM      Passed - Cr in normal range and within 360 days    Creatinine, Ser  Date Value Ref Range Status  01/23/2024 1.12 0.76 - 1.27 mg/dL Final         Passed - AST in normal range and within 360 days    AST  Date Value Ref Range Status  01/23/2024 19 0 - 40 IU/L Final         Passed - ALT in normal range and within 360 days    ALT  Date Value Ref Range Status  01/23/2024 29 0 - 44 IU/L Final         Passed - Last BP in normal range    BP Readings from Last 1 Encounters:  01/23/24 136/74         Passed - Last Heart Rate in normal range    Pulse Readings from Last 1 Encounters:  01/23/24 80         Passed - Valid encounter within last 6 months    Recent Outpatient Visits           2 days ago Type 2 diabetes mellitus with proteinuria (HCC)   [place] [facility] [place], [place] T, NP   2 months ago Type 2 diabetes mellitus with proteinuria (HCC)   [place] [facility] [place], Melanie T, NP   3 months ago Chronic atrial fibrillation (HCC)   [place] [facility] [place], Melanie T, NP   3 months ago Chronic atrial fibrillation (HCC)   [place] [facility] [place], Melanie T, NP   3 months ago Chronic atrial fibrillation [facility])   [place] [facility] [place], Melanie DASEN, NP

## 2024-01-28 NOTE — Patient Instructions (Signed)

## 2024-01-31 ENCOUNTER — Ambulatory Visit (INDEPENDENT_AMBULATORY_CARE_PROVIDER_SITE_OTHER): Admitting: Nurse Practitioner

## 2024-01-31 ENCOUNTER — Encounter: Payer: Self-pay | Admitting: Nurse Practitioner

## 2024-01-31 VITALS — BP 117/70 | HR 67 | Temp 97.9°F | Resp 15 | Ht 65.98 in | Wt 245.0 lb

## 2024-01-31 DIAGNOSIS — Z952 Presence of prosthetic heart valve: Secondary | ICD-10-CM | POA: Diagnosis not present

## 2024-01-31 DIAGNOSIS — Z7901 Long term (current) use of anticoagulants: Secondary | ICD-10-CM

## 2024-01-31 DIAGNOSIS — L03116 Cellulitis of left lower limb: Secondary | ICD-10-CM

## 2024-01-31 LAB — COAGUCHEK XS/INR WAIVED
INR: 3.1 — ABNORMAL HIGH (ref 0.9–1.1)
Prothrombin Time: 37.1 s

## 2024-01-31 MED ORDER — CEPHALEXIN 500 MG PO CAPS: 500.0000 mg | ORAL_CAPSULE | Freq: Two times a day (BID) | ORAL | 0 refills | Status: AC

## 2024-01-31 NOTE — Assessment & Plan Note (Signed)
 Chronic, ongoing with Coumadin  use. INR 3.1 today, in range even with abx on board for one week.  Continue Coumadin  11 MG once a week and 10 MG remainder of days at this time.  Not to take Ibuprofen due to risk for bleeding with this and only use Tylenol  or OTC Voltaren gel or Icy/Hot patches.  Remain off Fenofibrate  and fish oil.

## 2024-01-31 NOTE — Assessment & Plan Note (Signed)
 Acute and much improving on exam today.  Will extend Keflex  3 more days (BID dosing instead) + continue Mupirocin  ointment to apply to wound.  Recommend continue to monitor area and if any worsening return to office.  INR remains stable at 3.1, was 2.8 when abx started.

## 2024-01-31 NOTE — Progress Notes (Signed)
 BP 117/70 (BP Location: Left Arm, Patient Position: Sitting, Cuff Size: Large)   Pulse 67   Temp 97.9 F (36.6 C) (Oral)   Resp 15   Ht 5' 5.98 (1.676 m)   Wt 245 lb (111.1 kg)   SpO2 96%   BMI 39.56 kg/m    Subjective:    Patient ID: Rodney ONEIDA Kobus [name], male    DOB: 02/01/1961, 63 y.o.   MRN: [id number]  CC: Coumadin  management  HPI: This patient is a 63 y.o. male who presents for coumadin  management. The expected duration of coumadin  treatment is lifelong The reason for anticoagulation is  mechanical heart valve. Follows with cardiology, Dr. Ammon, last on 03/17/2023, no changes. Echo 09/05/23 with LVEF 60-65%, stable valve -- moderate aortic valve stenosis.   Treated for cellulitis to left lower leg with Keflex  on 01/23/24 and Mupirocin .  He reports improving a lot.  Present Coumadin  dose: 11 MG once a week and 10 MG remainder of days Goal: 2.5-3.5 The patient does not have an INR goal. Excessive bruising: no Nose bleeding: no Rectal bleeding: no Prolonged menstrual cycles: N/A Eating diet with consistent amounts of foods containing Vitamin K:no Any recent antibiotic use? no  Relevant past medical, surgical, family and social history reviewed and updated as indicated. Interim medical history since our last visit reviewed. Allergies and medications reviewed and updated.  ROS: Per HPI unless specifically indicated above     Objective:    BP 117/70 (BP Location: Left Arm, Patient Position: Sitting, Cuff Size: Large)   Pulse 67   Temp 97.9 F (36.6 C) (Oral)   Resp 15   Ht 5' 5.98 (1.676 m)   Wt 245 lb (111.1 kg)   SpO2 96%   BMI 39.56 kg/m   Wt Readings from Last 3 Encounters:  01/31/24 245 lb (111.1 kg)  01/23/24 248 lb (112.5 kg)  11/14/23 248 lb (112.5 kg)     General: Well appearing, well nourished in no distress.  Normal mood and affect. Skin: No excessive bruising or rash. Wound to left lower leg, smaller in size with less redness.  No warmth or  edema. Wound crusting over. No drainage.  Last INR: 2.8    Last CBC:  Lab Results  Component Value Date   WBC 10.6 (H) 09/05/2023   HGB 15.4 09/05/2023   HCT 44.9 09/05/2023   MCV 82.7 09/05/2023   PLT 195 09/05/2023    Results for orders placed or performed in visit on 01/23/24  CoaguChek XS/INR Waived   Collection Time: 01/23/24  4:03 PM  Result Value Ref Range   INR 2.8 (H) 0.9 - 1.1   Prothrombin Time 33.2 sec  Comprehensive metabolic panel with GFR   Collection Time: 01/23/24  4:04 PM  Result Value Ref Range   Glucose 129 (H) 70 - 99 mg/dL   BUN 21 8 - 27 mg/dL   Creatinine, Ser 8.87 0.76 - 1.27 mg/dL   eGFR 74 >40 fO/fpw/8.26   BUN/Creatinine Ratio 19 10 - 24   Sodium 138 134 - 144 mmol/L   Potassium 4.3 3.5 - 5.2 mmol/L   Chloride 100 96 - 106 mmol/L   CO2 25 20 - 29 mmol/L   Calcium  9.5 8.6 - 10.2 mg/dL   Total Protein 7.4 6.0 - 8.5 g/dL   Albumin 4.4 3.9 - 4.9 g/dL   Globulin, Total 3.0 1.5 - 4.5 g/dL   Bilirubin Total 0.5 0.0 - 1.2 mg/dL   Alkaline Phosphatase 86 47 -  123 IU/L   AST 19 0 - 40 IU/L   ALT 29 0 - 44 IU/L  Lipid Panel w/o Chol/HDL Ratio   Collection Time: 01/23/24  4:04 PM  Result Value Ref Range   Cholesterol, Total 100 100 - 199 mg/dL   Triglycerides 843 (H) 0 - 149 mg/dL   HDL 26 (L) >60 mg/dL   VLDL Cholesterol Cal 27 5 - 40 mg/dL   LDL Chol Calc (NIH) 47 0 - 99 mg/dL       Assessment:     [id number]   1. Cellulitis of left leg  L03.116     2. Mechanical heart valve present  Z95.2 CoaguChek XS/INR Waived    3. Current use of long term anticoagulation  Z79.01 CoaguChek XS/INR Waived      Plan:   Discussed current plan face-to-face with patient. For coumadin  dosing, continue taking 11 MG once a week and 10 MG remainder of days. Will plan to recheck INR in 4 weeks.

## 2024-01-31 NOTE — Assessment & Plan Note (Signed)
 Refer to mechanical valve plan of care.

## 2024-02-22 ENCOUNTER — Other Ambulatory Visit: Payer: Self-pay | Admitting: Nurse Practitioner

## 2024-02-23 ENCOUNTER — Other Ambulatory Visit: Payer: Self-pay | Admitting: Nurse Practitioner

## 2024-02-23 NOTE — Telephone Encounter (Signed)
 Requested Prescriptions  Pending Prescriptions Disp Refills   atorvastatin  (LIPITOR) 80 MG tablet [Pharmacy Med Name: ATORVASTATIN  80 MG TABLET] 90 tablet 0    Sig: TAKE 1 TABLET BY MOUTH EVERY DAY     Cardiovascular:  Antilipid - Statins Failed - 02/23/2024  1:28 PM      Failed - Lipid Panel in normal range within the last 12 months    Cholesterol, Total  Date Value Ref Range Status  01/23/2024 100 100 - 199 mg/dL Final   LDL Chol Calc (NIH)  Date Value Ref Range Status  01/23/2024 47 0 - 99 mg/dL Final   HDL  Date Value Ref Range Status  01/23/2024 26 (L) >39 mg/dL Final   Triglycerides  Date Value Ref Range Status  01/23/2024 156 (H) 0 - 149 mg/dL Final         Passed - Patient is not pregnant      Passed - Valid encounter within last 12 months    Recent Outpatient Visits           3 weeks ago Cellulitis of left leg   [place] [facility] [place], Bemiss T, NP   1 month ago Type 2 diabetes mellitus with proteinuria (HCC)   [place] [facility] [place], Titanic T, NP   3 months ago Type 2 diabetes mellitus with proteinuria (HCC)   [place] [facility] [place], [place] T, NP   4 months ago Chronic atrial fibrillation (HCC)   [place] [facility] [place], Melanie T, NP   4 months ago Chronic atrial fibrillation [facility])   [place] [facility] [place], Melanie DASEN, NP

## 2024-02-25 NOTE — Patient Instructions (Signed)
 Healthy Eating, Adult Healthy eating may help you get and keep a healthy body weight, reduce the risk of chronic disease, and live a long and productive life. It is important to follow a healthy eating pattern. Your nutritional and calorie needs should be met mainly by different nutrient-rich foods. What are tips for following this plan? Reading food labels Read labels and choose the following: Reduced or low sodium products. Juices with 100% fruit juice. Foods with low saturated fats (<3 g per serving) and high polyunsaturated and monounsaturated fats. Foods with whole grains, such as whole wheat, cracked wheat, brown rice, and wild rice. Whole grains that are fortified with folic acid . This is recommended for females who are pregnant or who want to become pregnant. Read labels and do not eat or drink the following: Foods or drinks with added sugars. These include foods that contain brown sugar, corn sweetener, corn syrup, dextrose , fructose, glucose, high-fructose corn syrup, honey, invert sugar, lactose, malt syrup, maltose, molasses, raw sugar, sucrose, trehalose, or turbinado sugar. Limit your intake of added sugars to less than 10% of your total daily calories. Do not eat more than the following amounts of added sugar per day: 6 teaspoons (25 g) for females. 9 teaspoons (38 g) for males. Foods that contain processed or refined starches and grains. Refined grain products, such as white flour, degermed cornmeal, white bread, and white rice. Shopping Choose nutrient-rich snacks, such as vegetables, whole fruits, and nuts. Avoid high-calorie and high-sugar snacks, such as potato chips, fruit snacks, and candy. Use oil-based dressings and spreads on foods instead of solid fats such as butter, margarine, sour cream, or cream cheese. Limit pre-made sauces, mixes, and instant products such as flavored rice, instant noodles, and ready-made pasta. Try more plant-protein sources, such as tofu,  tempeh, black beans, edamame, lentils, nuts, and seeds. Explore eating plans such as the Mediterranean diet or vegetarian diet. Try heart-healthy dips made with beans and healthy fats like hummus and guacamole. Vegetables go great with these. Cooking Use oil to saut or stir-fry foods instead of solid fats such as butter, margarine, or lard. Try baking, boiling, grilling, or broiling instead of frying. Remove the fatty part of meats before cooking. Steam vegetables in water  or broth. Meal planning  At meals, imagine dividing your plate into fourths: One-half of your plate is fruits and vegetables. One-fourth of your plate is whole grains. One-fourth of your plate is protein, especially lean meats, poultry, eggs, tofu, beans, or nuts. Include low-fat dairy as part of your daily diet. Lifestyle Choose healthy options in all settings, including home, work, school, restaurants, or stores. Prepare your food safely: Wash your hands after handling raw meats. Where you prepare food, keep surfaces clean by regularly washing with hot, soapy water . Keep raw meats separate from ready-to-eat foods, such as fruits and vegetables. Cook seafood, meat, poultry, and eggs to the recommended temperature. Get a food thermometer. Store foods at safe temperatures. In general: Keep cold foods at 34F (4.4C) or below. Keep hot foods at 134F (60C) or above. Keep your freezer at [facility] (-17.8C) or below. Foods are not safe to eat if they have been between the temperatures of 40-134F (4.4-60C) for more than 2 hours. What foods should I eat? Fruits Aim to eat 1-2 cups of fresh, canned (in natural juice), or frozen fruits each day. One cup of fruit equals 1 small apple, 1 large banana, 8 large strawberries, 1 cup (237 g) canned fruit,  cup (82 g) dried fruit,  or 1 cup (240 mL) 100% juice. Vegetables Aim to eat 2-4 cups of fresh and frozen vegetables each day, including different varieties and colors. One cup  of vegetables equals 1 cup (91 g) broccoli or cauliflower florets, 2 medium carrots, 2 cups (150 g) raw, leafy greens, 1 large tomato, 1 large bell pepper, 1 large sweet potato, or 1 medium white potato. Grains Aim to eat 5-10 ounce-equivalents of whole grains each day. Examples of 1 ounce-equivalent of grains include 1 slice of bread, 1 cup (40 g) ready-to-eat cereal, 3 cups (24 g) popcorn, or  cup (93 g) cooked rice. Meats and other proteins Try to eat 5-7 ounce-equivalents of protein each day. Examples of 1 ounce-equivalent of protein include 1 egg,  oz nuts (12 almonds, 24 pistachios, or 7 walnut halves), 1/4 cup (90 g) cooked beans, 6 tablespoons (90 g) hummus or 1 tablespoon (16 g) peanut butter. A cut of meat or fish that is the size of a deck of cards is about 3-4 ounce-equivalents (85 g). Of the protein you eat each week, try to have at least 8 sounce (227 g) of seafood. This is about 2 servings per week. This includes salmon, trout, herring, sardines, and anchovies. Dairy Aim to eat 3 cup-equivalents of fat-free or low-fat dairy each day. Examples of 1 cup-equivalent of dairy include 1 cup (240 mL) milk, 8 ounces (250 g) yogurt, 1 ounces (44 g) natural cheese, or 1 cup (240 mL) fortified soy milk. Fats and oils Aim for about 5 teaspoons (21 g) of fats and oils per day. Choose monounsaturated fats, such as canola and olive oils, mayonnaise made with olive oil or avocado oil, avocados, peanut butter, and most nuts, or polyunsaturated fats, such as sunflower, corn, and soybean oils, walnuts, pine nuts, sesame seeds, sunflower seeds, and flaxseed. Beverages Aim for 6 eight-ounce glasses of water  per day. Limit coffee to 3-5 eight-ounce cups per day. Limit caffeinated beverages that have added calories, such as soda and energy drinks. If you drink alcohol: Limit how much you have to: 0-1 drink a day if you are male. 0-2 drinks a day if you are male. Know how much alcohol is in your drink.  In the [place] one drink is one 12 oz bottle of beer (355 mL), one 5 oz glass of wine (148 mL), or one 1 oz glass of hard liquor (44 mL). Seasoning and other foods Try not to add too much salt to your food. Try using herbs and spices instead of salt. Try not to add sugar to food. This information is based on [place] nutrition guidelines. To learn more, visit DisposableNylon.be. Exact amounts may vary. You may need different amounts. This information is not intended to replace advice given to you by your health care provider. Make sure you discuss any questions you have with your health care provider. Document Revised: 01/24/2022 Document Reviewed: 01/24/2022 Elsevier Patient Education  2024 [employer].

## 2024-02-26 NOTE — Telephone Encounter (Signed)
 Requested medications are due for refill today.  unsure  Requested medications are on the active medications list.  no  Last refill. na  Future visit scheduled.   yes  Notes to clinic.  Note from pharmacy:Pharmacy comment: Alternative Requested:THE PRESCRIBED MEDICATION IS NOT COVERED BY INSURANCE. PLEASE CONSIDER CHANGING TO ONE OF THE SUGGESTED COVERED ALTERNATIVES.   Pt already has an r for atorvastatin  signed 02/23/2024.  Please review for refill.    Requested Prescriptions  Pending Prescriptions Disp Refills   rosuvastatin (CRESTOR) 40 MG tablet [Pharmacy Med Name: ROSUVASTATIN CALCIUM  40 MG TAB]  0     Cardiovascular:  Antilipid - Statins 2 Failed - 02/26/2024  3:20 PM      Failed - Lipid Panel in normal range within the last 12 months    Cholesterol, Total  Date Value Ref Range Status  01/23/2024 100 100 - 199 mg/dL Final   LDL Chol Calc (NIH)  Date Value Ref Range Status  01/23/2024 47 0 - 99 mg/dL Final   HDL  Date Value Ref Range Status  01/23/2024 26 (L) >39 mg/dL Final   Triglycerides  Date Value Ref Range Status  01/23/2024 156 (H) 0 - 149 mg/dL Final         Passed - Cr in normal range and within 360 days    Creatinine, Ser  Date Value Ref Range Status  01/23/2024 1.12 0.76 - 1.27 mg/dL Final         Passed - Patient is not pregnant      Passed - Valid encounter within last 12 months    Recent Outpatient Visits           3 weeks ago Cellulitis of left leg   [place] [facility] [place], [place] T, NP   1 month ago Type 2 diabetes mellitus with proteinuria (HCC)   [place] [facility] [place], Norman T, NP   3 months ago Type 2 diabetes mellitus with proteinuria (HCC)   [place] [facility] [place], [place] T, NP   4 months ago Chronic atrial fibrillation (HCC)   [place] [facility] [place], Melanie T, NP   4 months ago Chronic atrial fibrillation [facility])   [place] [facility] [place], Melanie DASEN, NP

## 2024-02-28 ENCOUNTER — Encounter: Payer: Self-pay | Admitting: Nurse Practitioner

## 2024-02-28 ENCOUNTER — Ambulatory Visit: Admitting: Nurse Practitioner

## 2024-02-28 VITALS — BP 132/79 | HR 66 | Temp 98.4°F | Resp 17 | Ht 65.98 in | Wt 246.1 lb

## 2024-02-28 DIAGNOSIS — I482 Chronic atrial fibrillation, unspecified: Secondary | ICD-10-CM

## 2024-02-28 DIAGNOSIS — Z7901 Long term (current) use of anticoagulants: Secondary | ICD-10-CM

## 2024-02-28 DIAGNOSIS — Z952 Presence of prosthetic heart valve: Secondary | ICD-10-CM | POA: Diagnosis not present

## 2024-02-28 LAB — COAGUCHEK XS/INR WAIVED
INR: 2.7 — ABNORMAL HIGH (ref 0.9–1.1)
Prothrombin Time: 32.9 s

## 2024-02-28 MED ORDER — CYCLOBENZAPRINE HCL 10 MG PO TABS
10.0000 mg | ORAL_TABLET | Freq: Three times a day (TID) | ORAL | 1 refills | Status: AC | PRN
Start: 2024-02-28 — End: ?

## 2024-02-28 NOTE — Assessment & Plan Note (Signed)
 Chronic, ongoing.  BP at goal today. Continue Benzapril, HCTZ, Coreg  + Amlodipine , educated him on these medications and use + side effects.  Highly recommend he monitor BP at home at least a few mornings a week + focus on DASH diet.  Discussed with him stroke prevention goal for BP <130/80.  LABS: up to date.  Continue cardiology collaboration.  Continue to collaborate with [facility] PharmD.

## 2024-02-28 NOTE — Assessment & Plan Note (Signed)
 Chronic, ongoing with Coumadin  use. INR 2.7 today, in range even with abx on board for one week.  Continue Coumadin  11 MG once a week and 10 MG remainder of days at this time.  Not to take Ibuprofen due to risk for bleeding with this and only use Tylenol  or OTC Voltaren gel or Icy/Hot patches.  Remain off Fenofibrate  and fish oil.

## 2024-02-28 NOTE — Progress Notes (Signed)
   BP 132/79 (BP Location: Left Arm, Patient Position: Sitting, Cuff Size: Large)   Pulse 66   Temp 98.4 F (36.9 C) (Oral)   Resp 17   Ht 5' 5.98 (1.676 m)   Wt 246 lb 1.6 oz (111.6 kg)   SpO2 96%   BMI 39.74 kg/m    Subjective:    Patient ID: Rodney ONEIDA Kobus [name], male    DOB: [birth year], 63 y.o.   MRN: [id number]  CC: Coumadin  management  HPI: This patient is a 63 y.o. male who presents for coumadin  management. The expected duration of coumadin  treatment is lifelong The reason for anticoagulation is  mechanical heart valve. Follows with cardiology, Dr. Ammon, last on 03/17/2023, no changes. Echo 09/05/23 with LVEF 60-65%, stable valve -- moderate aortic valve stenosis.   Present Coumadin  dose: 11 MG once a week and 10 MG remainder of days Goal: 2.5-3.5 The patient does not have an INR goal. Excessive bruising: no Nose bleeding: no Rectal bleeding: no Prolonged menstrual cycles: N/A Eating diet with consistent amounts of foods containing Vitamin K:no Any recent antibiotic use? no  Relevant past medical, surgical, family and social history reviewed and updated as indicated. Interim medical history since our last visit reviewed. Allergies and medications reviewed and updated.  ROS: Per HPI unless specifically indicated above     Objective:    BP 132/79 (BP Location: Left Arm, Patient Position: Sitting, Cuff Size: Large)   Pulse 66   Temp 98.4 F (36.9 C) (Oral)   Resp 17   Ht 5' 5.98 (1.676 m)   Wt 246 lb 1.6 oz (111.6 kg)   SpO2 96%   BMI 39.74 kg/m   Wt Readings from Last 3 Encounters:  02/28/24 246 lb 1.6 oz (111.6 kg)  01/31/24 245 lb (111.1 kg)  01/23/24 248 lb (112.5 kg)     General: Well appearing, well nourished in no distress.  Normal mood and affect. Skin: No excessive bruising or rash  Last INR: 3.1    Last CBC:  Lab Results  Component Value Date   WBC 10.6 (H) 09/05/2023   HGB 15.4 09/05/2023   HCT 44.9 09/05/2023   MCV 82.7 09/05/2023    PLT 195 09/05/2023    Results for orders placed or performed in visit on 01/31/24  CoaguChek XS/INR Waived   Collection Time: 01/31/24 10:09 AM  Result Value Ref Range   INR 3.1 (H) 0.9 - 1.1   Prothrombin Time 37.1 sec       Assessment:     ICD-10-CM   1. Chronic atrial fibrillation (HCC)  I48.20 CoaguChek XS/INR Waived    2. Mechanical heart valve present  Z95.2 CoaguChek XS/INR Waived    3. Current use of long term anticoagulation  Z79.01 CoaguChek XS/INR Waived      Plan:   Discussed current plan face-to-face with patient. For coumadin  dosing, continue taking 11 MG once a week and 10 MG remainder of days. Will plan to recheck INR in 4 weeks.

## 2024-02-28 NOTE — Assessment & Plan Note (Signed)
 Chronic, ongoing with Coumadin  use.  Continue collaboration with cardiology, last note reviewed.  Continue all medications.

## 2024-02-28 NOTE — Assessment & Plan Note (Signed)
 Refer to mechanical valve plan of care.

## 2024-03-12 NOTE — Progress Notes (Unsigned)
   03/13/2024  Patient ID: Rodney ONEIDA Kobus [name], male   DOB: 1961/05/08, 63 y.o.   MRN: [id number]  Subjective/Objective Telephone visit to follow-up on management of chronic conditions   Diabetes -Current medications:  Fiasp  16 units TID with meals unless BG < 120 (skip), Basaglar  60 units daily (55 units if FBG <70), Jardiance  25mg  daily -Patient is monitoring FBG daily and states this was 156 today- endorses being on vacation then getting sick and says he has fallen off the wagon -Patient has all diabetes medications and is taking them as prescribed; no issues refilling Jardiance  consistently recently -Does not endorse any s/sx of hypoglycemia or hyperglycemia -A1c of 7.8% recently, down from 12.3% in May -Saw endocrinology again 8/7, and no changes were made to medications   Hypertension -Current medications:  amlodipine  5mg  daily, benazepril  40mg  daily, carvedilol  25mg  BID, hydrochlorothiazide  25mg  daily -Patient was provided BP monitor at 5/19 PCP visit and states he has been checking home BP now and this has been normal- does not provide readings, though -BP 132/79 at 10/22 PCP visit  Hyperlipidemia -Current medications:  atorvastatin  80mg  daily -Recently changed to rosuvastatin 40mg  daily based on insurance preference per chart notes- patient has picked this up but plans to finish the 1-2 week supply of atorvastatin  on hand before switching medications -LDL 47 on 9/16   Assessment/Plan   Diabetes  -Uncontrolled based on A1c goal of <7% -Continue current regimen at this time -Continue to monitor BG regularly -Follow-up with endocrinology 11/12 and will be due for A1c   Hypertension -Controlled  -Continue current regimen at this time -Monitor and record home BP at least 3x/week  Hyperlipidemia  -Controlled with LDL <70 -Switch to rosuvastatin 40mg  daily after completing atorvastatin  40mg   -I recommend a follow-up lipid panel and CMP in 12 weeks   Follow-up:  12/10    Rodney Dixon, PharmD, DPLA

## 2024-03-13 ENCOUNTER — Other Ambulatory Visit: Payer: Self-pay

## 2024-03-13 ENCOUNTER — Other Ambulatory Visit: Payer: Self-pay | Admitting: Nurse Practitioner

## 2024-03-13 DIAGNOSIS — I152 Hypertension secondary to endocrine disorders: Secondary | ICD-10-CM

## 2024-03-13 DIAGNOSIS — E1129 Type 2 diabetes mellitus with other diabetic kidney complication: Secondary | ICD-10-CM

## 2024-03-13 DIAGNOSIS — E1169 Type 2 diabetes mellitus with other specified complication: Secondary | ICD-10-CM

## 2024-03-14 NOTE — Telephone Encounter (Signed)
 Requested medications are due for refill today.  yes  Requested medications are on the active medications list.  yes  Last refill. 09/18/2023 #90 0 rf  Future visit scheduled.   yes  Notes to clinic.  Abnormal labs    Requested Prescriptions  Pending Prescriptions Disp Refills   warfarin (COUMADIN ) 10 MG tablet [Pharmacy Med Name: WARFARIN SODIUM  10 MG TABLET] 30 tablet 5    Sig: TAKE 1 TABLET BY MOUTH EVERY DAY     Hematology:  Anticoagulants - warfarin Failed - 03/14/2024  2:10 PM      Failed - Manual Review: If patient's warfarin is managed by Anti-Coag team, route request to them. If not, route request to the provider.      Failed - INR in normal range and within 30 days    INR  Date Value Ref Range Status  02/28/2024 2.7 (H) 0.9 - 1.1 Final         Passed - HCT in normal range and within 360 days    HCT  Date Value Ref Range Status  09/05/2023 44.9 39.0 - 52.0 % Final   Hematocrit  Date Value Ref Range Status  04/28/2023 44.3 37.5 - 51.0 % Final         Passed - Patient is not pregnant      Passed - Valid encounter within last 3 months    Recent Outpatient Visits           2 weeks ago Chronic atrial fibrillation (HCC)   [place] [facility] [place], Schaller T, NP   1 month ago Cellulitis of left leg   [place] [facility] [place], [place] T, NP   1 month ago Type 2 diabetes mellitus with proteinuria (HCC)   [place] [facility] [place], [place] T, NP   4 months ago Type 2 diabetes mellitus with proteinuria (HCC)   [place] [place], Melanie T, NP   4 months ago Chronic atrial fibrillation [facility])   [place] [facility] [place], Melanie DASEN, NP

## 2024-03-23 ENCOUNTER — Other Ambulatory Visit: Payer: Self-pay | Admitting: Nurse Practitioner

## 2024-03-23 DIAGNOSIS — I152 Hypertension secondary to endocrine disorders: Secondary | ICD-10-CM

## 2024-03-23 NOTE — Patient Instructions (Signed)

## 2024-03-24 ENCOUNTER — Other Ambulatory Visit: Payer: Self-pay | Admitting: Nurse Practitioner

## 2024-03-26 NOTE — Telephone Encounter (Signed)
 Requested medication (s) are due for refill today - unsure  Requested medication (s) are on the active medication list -yes  Future visit scheduled -yes  Last refill: 09/07/23  Notes to clinic: listed as historical provider- sent for review   Requested Prescriptions  Pending Prescriptions Disp Refills   hydrochlorothiazide  (HYDRODIURIL ) 25 MG tablet [Pharmacy Med Name: HYDROCHLOROTHIAZIDE  25 MG TAB] 30 tablet 32    Sig: TAKE 1 TABLET (25 MG TOTAL) BY MOUTH DAILY.     Cardiovascular: Diuretics - Thiazide Passed - 03/26/2024  1:33 PM      Passed - Cr in normal range and within 180 days    Creatinine, Ser  Date Value Ref Range Status  01/23/2024 1.12 0.76 - 1.27 mg/dL Final         Passed - K in normal range and within 180 days    Potassium  Date Value Ref Range Status  01/23/2024 4.3 3.5 - 5.2 mmol/L Final         Passed - Na in normal range and within 180 days    Sodium  Date Value Ref Range Status  01/23/2024 138 134 - 144 mmol/L Final         Passed - Last BP in normal range    BP Readings from Last 1 Encounters:  02/28/24 132/79         Passed - Valid encounter within last 6 months    Recent Outpatient Visits           3 weeks ago Chronic atrial fibrillation (HCC)   [place] [facility] [place], Melanie T, NP   1 month ago Cellulitis of left leg   [place] [facility] [place], [place] T, NP   2 months ago Type 2 diabetes mellitus with proteinuria (HCC)   [facility] [place], [place] T, NP   4 months ago Type 2 diabetes mellitus with proteinuria (HCC)   [place] [facility] [place], Melanie T, NP   5 months ago Chronic atrial fibrillation (HCC)   [place] [facility] [place], Melanie T, NP                 Requested Prescriptions  Pending Prescriptions Disp Refills   hydrochlorothiazide  (HYDRODIURIL ) 25 MG tablet [Pharmacy Med Name: HYDROCHLOROTHIAZIDE  25 MG TAB] 30  tablet 32    Sig: TAKE 1 TABLET (25 MG TOTAL) BY MOUTH DAILY.     Cardiovascular: Diuretics - Thiazide Passed - 03/26/2024  1:33 PM      Passed - Cr in normal range and within 180 days    Creatinine, Ser  Date Value Ref Range Status  01/23/2024 1.12 0.76 - 1.27 mg/dL Final         Passed - K in normal range and within 180 days    Potassium  Date Value Ref Range Status  01/23/2024 4.3 3.5 - 5.2 mmol/L Final         Passed - Na in normal range and within 180 days    Sodium  Date Value Ref Range Status  01/23/2024 138 134 - 144 mmol/L Final         Passed - Last BP in normal range    BP Readings from Last 1 Encounters:  02/28/24 132/79         Passed - Valid encounter within last 6 months    Recent Outpatient Visits           3 weeks ago Chronic  atrial fibrillation (HCC)   [place] [facility] [place], [place] T, NP   1 month ago Cellulitis of left leg   [place] [facility] [place], [place] T, NP   2 months ago Type 2 diabetes mellitus with proteinuria (HCC)   [place] [facility] [place], Laie T, NP   4 months ago Type 2 diabetes mellitus with proteinuria (HCC)   [place] [facility] [place], Melanie T, NP   5 months ago Chronic atrial fibrillation [facility])   [place] [facility] [place], Melanie DASEN, NP

## 2024-03-26 NOTE — Telephone Encounter (Signed)
 Requested Prescriptions  Refused Prescriptions Disp Refills   carvedilol  (COREG ) 25 MG tablet [Pharmacy Med Name: CARVEDILOL  25 MG TABLET] 180 tablet 1    Sig: TAKE 1 TABLET (25 MG TOTAL) BY MOUTH TWICE A DAY WITH MEALS     Cardiovascular: Beta Blockers 3 Passed - 03/26/2024 12:08 PM      Passed - Cr in normal range and within 360 days    Creatinine, Ser  Date Value Ref Range Status  01/23/2024 1.12 0.76 - 1.27 mg/dL Final         Passed - AST in normal range and within 360 days    AST  Date Value Ref Range Status  01/23/2024 19 0 - 40 IU/L Final         Passed - ALT in normal range and within 360 days    ALT  Date Value Ref Range Status  01/23/2024 29 0 - 44 IU/L Final         Passed - Last BP in normal range    BP Readings from Last 1 Encounters:  02/28/24 132/79         Passed - Last Heart Rate in normal range    Pulse Readings from Last 1 Encounters:  02/28/24 66         Passed - Valid encounter within last 6 months    Recent Outpatient Visits           3 weeks ago Chronic atrial fibrillation (HCC)   [place] [facility] [place], [place] T, NP   1 month ago Cellulitis of left leg   [place] [facility] [place], [place] T, NP   2 months ago Type 2 diabetes mellitus with proteinuria (HCC)   [place] [facility] [place], [place] T, NP   4 months ago Type 2 diabetes mellitus with proteinuria (HCC)   [place] [facility] [place], Melanie T, NP   5 months ago Chronic atrial fibrillation [facility])   [place] [facility] [place], Melanie DASEN, NP

## 2024-03-27 ENCOUNTER — Encounter: Payer: Self-pay | Admitting: Nurse Practitioner

## 2024-03-27 ENCOUNTER — Ambulatory Visit (INDEPENDENT_AMBULATORY_CARE_PROVIDER_SITE_OTHER): Admitting: Nurse Practitioner

## 2024-03-27 VITALS — BP 128/82 | HR 71 | Temp 98.4°F | Resp 17 | Ht 65.98 in | Wt 250.0 lb

## 2024-03-27 DIAGNOSIS — Z7901 Long term (current) use of anticoagulants: Secondary | ICD-10-CM

## 2024-03-27 DIAGNOSIS — Z952 Presence of prosthetic heart valve: Secondary | ICD-10-CM | POA: Diagnosis not present

## 2024-03-27 DIAGNOSIS — M79622 Pain in left upper arm: Secondary | ICD-10-CM | POA: Insufficient documentation

## 2024-03-27 DIAGNOSIS — I482 Chronic atrial fibrillation, unspecified: Secondary | ICD-10-CM

## 2024-03-27 LAB — COAGUCHEK XS/INR WAIVED
INR: 2.5 — ABNORMAL HIGH (ref 0.9–1.1)
Prothrombin Time: 29.8 s

## 2024-03-27 NOTE — Progress Notes (Signed)
 BP 128/82 (BP Location: Left Arm, Patient Position: Sitting, Cuff Size: Large)   Pulse 71   Temp 98.4 F (36.9 C) (Oral)   Resp 17   Ht 5' 5.98 (1.676 m)   Wt 250 lb (113.4 kg)   SpO2 98%   BMI 40.37 kg/m    Subjective:    Patient ID: Rodney ONEIDA Kobus [name], male    DOB: 1961/01/15, 63 y.o.   MRN: [id number]  CC: Coumadin  management  HPI: This patient is a 63 y.o. male who presents for coumadin  management. The expected duration of coumadin  treatment is lifelong The reason for anticoagulation is  mechanical heart valve. Follows with cardiology, Dr. Ammon, last on 03/17/2023, no changes. Echo 09/05/23 with LVEF 60-65%, stable valve -- moderate aortic valve stenosis.   Reports noticing a sore area under his left axilla area over past couple days. A bit puffy and was sore under axilla. Not sick recently and no fevers.  Reports it has improved a lot today, he did take some Ibuprofen which helped.  Present Coumadin  dose: 11 MG once a week and 10 MG remainder of days Goal: 2.5-3.5 The patient does not have an INR goal. Excessive bruising: no Nose bleeding: no Rectal bleeding: no Prolonged menstrual cycles: N/A Eating diet with consistent amounts of foods containing Vitamin K:no Any recent antibiotic use? no  Relevant past medical, surgical, family and social history reviewed and updated as indicated. Interim medical history since our last visit reviewed. Allergies and medications reviewed and updated.  ROS: Per HPI unless specifically indicated above     Objective:    BP 128/82 (BP Location: Left Arm, Patient Position: Sitting, Cuff Size: Large)   Pulse 71   Temp 98.4 F (36.9 C) (Oral)   Resp 17   Ht 5' 5.98 (1.676 m)   Wt 250 lb (113.4 kg)   SpO2 98%   BMI 40.37 kg/m   Wt Readings from Last 3 Encounters:  03/27/24 250 lb (113.4 kg)  02/28/24 246 lb 1.6 oz (111.6 kg)  01/31/24 245 lb (111.1 kg)     Physical Exam Vitals and nursing note reviewed.  Constitutional:       General: He is not in acute distress.    Appearance: Normal appearance. He is well-groomed. He is obese. He is not ill-appearing or toxic-appearing.  HENT:     Head: Normocephalic.     Right Ear: Hearing and external ear normal.     Left Ear: Hearing and external ear normal.     Nose: Nose normal.  Eyes:     General: Lids are normal.     Extraocular Movements: Extraocular movements intact.  Neck:     Thyroid : No thyromegaly.  Cardiovascular:     Rate and Rhythm: Normal rate and regular rhythm.     Heart sounds: No murmur heard.    No systolic murmur is present.     No diastolic murmur is present.     No gallop.  Pulmonary:     Effort: Pulmonary effort is normal. No accessory muscle usage or respiratory distress.     Breath sounds: Normal breath sounds. No decreased breath sounds, wheezing or rales.  Abdominal:     General: Bowel sounds are normal.     Palpations: Abdomen is soft.     Tenderness: There is no abdominal tenderness. There is no right CVA tenderness or left CVA tenderness.  Musculoskeletal:     Cervical back: Normal range of motion.     Right  lower leg: No edema.     Left lower leg: No edema.  Lymphadenopathy:     Head:     Right side of head: No submental, submandibular, tonsillar, preauricular or posterior auricular adenopathy.     Left side of head: No submental, submandibular, tonsillar, preauricular or posterior auricular adenopathy.     Cervical: No cervical adenopathy.     Upper Body:     Right upper body: No axillary adenopathy.     Left upper body: No axillary adenopathy.     Comments: Left axilla with no swelling, redness, or warmth. No masses palpated, even with deep palpation of area. Mild tenderness reported with palpation.  Neurological:     Mental Status: He is alert.     Cranial Nerves: Cranial nerves 2-12 are intact.     Gait: Gait is intact.     Deep Tendon Reflexes:     Reflex Scores:      Brachioradialis reflexes are 2+ on the right  side and 2+ on the left side.      Patellar reflexes are 2+ on the right side and 2+ on the left side. Psychiatric:        Attention and Perception: Attention normal.        Mood and Affect: Mood normal.        Speech: Speech normal.        Behavior: Behavior is cooperative.        Thought Content: Thought content normal.     Last INR: 2.7    Last CBC:  Lab Results  Component Value Date   WBC 10.6 (H) 09/05/2023   HGB 15.4 09/05/2023   HCT 44.9 09/05/2023   MCV 82.7 09/05/2023   PLT 195 09/05/2023    Results for orders placed or performed in visit on 02/28/24  CoaguChek XS/INR Waived   Collection Time: 02/28/24  8:31 AM  Result Value Ref Range   INR 2.7 (H) 0.9 - 1.1   Prothrombin Time 32.9 sec       Assessment:     ICD-10-CM   1. Chronic atrial fibrillation (HCC)  I48.20     2. Mechanical heart valve present  Z95.2 CoaguChek XS/INR Waived    3. Current use of long term anticoagulation  Z79.01 CoaguChek XS/INR Waived    4. Pain in left axilla  M79.622       Plan:   Discussed current plan face-to-face with patient. For coumadin  dosing, continue taking 11 MG once a week and 10 MG remainder of days. Will plan to recheck INR in 4 weeks.

## 2024-03-27 NOTE — Assessment & Plan Note (Signed)
 Refer to mechanical valve plan of care.

## 2024-03-27 NOTE — Assessment & Plan Note (Signed)
 Chronic, ongoing with Coumadin  use. INR 2.5 today, remaining in range of 2.5 to 3.5.  Continue Coumadin  11 MG once a week and 10 MG remainder of days at this time.  Not to take Ibuprofen due to risk for bleeding with this and only use Tylenol  or OTC Voltaren gel or Icy/Hot patches.  Remain off Fenofibrate  and fish oil.

## 2024-03-27 NOTE — Assessment & Plan Note (Signed)
 Acute, but improving today. ?some lymph node inflammation yesterday, although no recent illness or fever. No masses noted on exam and no s/s infection. At this time will defer imaging per request, but if any worsening or ongoing then to return to office and will order ultrasound. Apply ice to area at home as needed.

## 2024-03-27 NOTE — Assessment & Plan Note (Signed)
 Chronic, ongoing with Coumadin  use.  Continue collaboration with cardiology, last note reviewed.  Continue all medications.

## 2024-04-09 ENCOUNTER — Encounter: Payer: Self-pay | Admitting: Family Medicine

## 2024-04-09 ENCOUNTER — Ambulatory Visit (INDEPENDENT_AMBULATORY_CARE_PROVIDER_SITE_OTHER): Admitting: Family Medicine

## 2024-04-09 VITALS — BP 160/90 | HR 73 | Temp 98.3°F | Ht 66.0 in | Wt 254.0 lb

## 2024-04-09 DIAGNOSIS — J209 Acute bronchitis, unspecified: Secondary | ICD-10-CM

## 2024-04-09 MED ORDER — DOXYCYCLINE HYCLATE 100 MG PO TABS: 100.0000 mg | ORAL_TABLET | Freq: Two times a day (BID) | ORAL | 0 refills | Status: DC

## 2024-04-09 MED ORDER — PREDNISONE 50 MG PO TABS: 50.0000 mg | ORAL_TABLET | Freq: Every day | ORAL | 0 refills | Status: DC

## 2024-04-09 MED ORDER — ALBUTEROL SULFATE HFA 108 (90 BASE) MCG/ACT IN AERS: 1.0000 | INHALATION_SPRAY | Freq: Four times a day (QID) | RESPIRATORY_TRACT | 0 refills | Status: AC | PRN

## 2024-04-09 NOTE — Progress Notes (Signed)
 BP (!) 160/90 (BP Location: Left Arm, Cuff Size: Large)   Pulse 73   Temp 98.3 F (36.8 C) (Oral)   Ht 5' 6 (1.676 m)   Wt 254 lb (115.2 kg)   SpO2 92%   BMI 41.00 kg/m    Subjective:    Patient ID: Deward ONEIDA Kobus [name], male    DOB: 1960-10-02, 63 y.o.   MRN: [id number]  HPI: Octavious Zidek. is a 63 y.o. male  Chief Complaint  Patient presents with   Cough    Onset 04/05/24   Nasal Congestion   UPPER RESPIRATORY TRACT INFECTION Duration: 4 days Worst symptom: cough Fever: no Cough: yes Shortness of breath: no Wheezing: yes Chest pain: no Chest tightness: no Chest congestion: no Nasal congestion: yes Runny nose: yes Post nasal drip: no Sneezing: no Sore throat: no Swollen glands: no Sinus pressure: no Headache: yes Face pain: no Toothache: no Ear pain: no  Ear pressure: no  Eyes red/itching:no Eye drainage/crusting: no  Vomiting: no Rash: no Fatigue: yes Sick contacts: no Strep contacts: no  Context: worse Recurrent sinusitis: no Relief with OTC cold/cough medications: no  Treatments attempted: none   Relevant past medical, surgical, family and social history reviewed and updated as indicated. Interim medical history since our last visit reviewed. Allergies and medications reviewed and updated.  Review of Systems  Constitutional: Negative.   HENT:  Positive for congestion, postnasal drip and sinus pressure. Negative for dental problem, drooling, ear discharge, ear pain, facial swelling, hearing loss, mouth sores, nosebleeds, rhinorrhea, sinus pain, sneezing, sore throat, tinnitus, trouble swallowing and voice change.   Respiratory:  Positive for cough and wheezing. Negative for apnea, choking, chest tightness, shortness of breath and stridor.   Cardiovascular: Negative.   Musculoskeletal: Negative.   Skin: Negative.   Neurological: Negative.   Psychiatric/Behavioral: Negative.      Per HPI unless specifically indicated above     Objective:     BP (!) 160/90 (BP Location: Left Arm, Cuff Size: Large)   Pulse 73   Temp 98.3 F (36.8 C) (Oral)   Ht 5' 6 (1.676 m)   Wt 254 lb (115.2 kg)   SpO2 92%   BMI 41.00 kg/m   Wt Readings from Last 3 Encounters:  04/09/24 254 lb (115.2 kg)  03/27/24 250 lb (113.4 kg)  02/28/24 246 lb 1.6 oz (111.6 kg)    Physical Exam Vitals and nursing note reviewed.  Constitutional:      General: He is not in acute distress.    Appearance: Normal appearance. He is not ill-appearing, toxic-appearing or diaphoretic.  HENT:     Head: Normocephalic and atraumatic.     Right Ear: External ear normal.     Left Ear: External ear normal.     Nose: Nose normal.     Mouth/Throat:     Mouth: Mucous membranes are moist.     Pharynx: Oropharynx is clear.  Eyes:     General: No scleral icterus.       Right eye: No discharge.        Left eye: No discharge.     Extraocular Movements: Extraocular movements intact.     Conjunctiva/sclera: Conjunctivae normal.     Pupils: Pupils are equal, round, and reactive to light.  Cardiovascular:     Rate and Rhythm: Normal rate and regular rhythm.     Pulses: Normal pulses.     Heart sounds: Normal heart sounds. No murmur heard.  No friction rub. No gallop.  Pulmonary:     Effort: Pulmonary effort is normal. No respiratory distress.     Breath sounds: No stridor. Wheezing present. No rhonchi or rales.     Comments: Coarse breath sounds bilaterally Chest:     Chest wall: No tenderness.  Musculoskeletal:        General: Normal range of motion.     Cervical back: Normal range of motion and neck supple.  Skin:    General: Skin is warm and dry.     Capillary Refill: Capillary refill takes less than 2 seconds.     Coloration: Skin is not jaundiced or pale.     Findings: No bruising, erythema, lesion or rash.  Neurological:     General: No focal deficit present.     Mental Status: He is alert and oriented to person, place, and time. Mental status is at  baseline.  Psychiatric:        Mood and Affect: Mood normal.        Behavior: Behavior normal.        Thought Content: Thought content normal.        Judgment: Judgment normal.     Results for orders placed or performed in visit on 03/27/24  CoaguChek XS/INR Waived   Collection Time: 03/27/24 10:54 AM  Result Value Ref Range   INR 2.5 (H) 0.9 - 1.1   Prothrombin Time 29.8 sec      Assessment & Plan:   Problem List Items Addressed This Visit   None Visit Diagnoses       Acute bronchitis, unspecified organism    -  Primary   Will treat with prednisone and doxycycline . Has follow up with PCP 12/17- recheck lungs at that time. Call with any concerns or if not getting better.        Follow up plan: Return for As scheduled.

## 2024-04-16 ENCOUNTER — Other Ambulatory Visit: Payer: Self-pay | Admitting: Nurse Practitioner

## 2024-04-16 NOTE — Progress Notes (Unsigned)
   03/13/2024  Patient ID: Rodney ONEIDA Kobus [name], male   DOB: [birth year], 63 y.o.   MRN: [id number]  Subjective/Objective Telephone visit to follow-up on management of chronic conditions   Diabetes -Current medications:  Fiasp  16 units TID with meals unless BG < 120 (skip), Basaglar  60 units daily (55 units if FBG <70), Jardiance  25mg  daily -Patient is monitoring FBG daily and states this was 156 today- endorses being on vacation then getting sick and says he has fallen off the wagon -Patient has all diabetes medications and is taking them as prescribed; no issues refilling Jardiance  consistently recently -Does not endorse any s/sx of hypoglycemia or hyperglycemia -A1c of 7.8% recently, down from 12.3% in May -Saw endocrinology again 8/7, and no changes were made to medications   Hypertension -Current medications:  amlodipine  5mg  daily, benazepril  40mg  daily, carvedilol  25mg  BID, hydrochlorothiazide  25mg  daily -Patient was provided BP monitor at 5/19 PCP visit and states he has been checking home BP now and this has been normal- does not provide readings, though -BP 132/79 at 10/22 PCP visit  Hyperlipidemia -Current medications:  atorvastatin  80mg  daily -Recently changed to rosuvastatin 40mg  daily based on insurance preference per chart notes- patient has picked this up but plans to finish the 1-2 week supply of atorvastatin  on hand before switching medications -LDL 47 on 9/16   Assessment/Plan   Diabetes  -Uncontrolled based on A1c goal of <7% -Continue current regimen at this time -Continue to monitor BG regularly -Follow-up with endocrinology 11/12 and will be due for A1c   Hypertension -Controlled  -Continue current regimen at this time -Monitor and record home BP at least 3x/week  Hyperlipidemia  -Controlled with LDL <70 -Switch to rosuvastatin 40mg  daily after completing atorvastatin  40mg   -I recommend a follow-up lipid panel and CMP in 12 weeks   Follow-up:  12/10    Rodney Dixon, Rodney Dixon, DPLA

## 2024-04-17 ENCOUNTER — Other Ambulatory Visit: Payer: Self-pay

## 2024-04-17 DIAGNOSIS — E1169 Type 2 diabetes mellitus with other specified complication: Secondary | ICD-10-CM

## 2024-04-17 DIAGNOSIS — I152 Hypertension secondary to endocrine disorders: Secondary | ICD-10-CM

## 2024-04-17 DIAGNOSIS — E1129 Type 2 diabetes mellitus with other diabetic kidney complication: Secondary | ICD-10-CM

## 2024-04-18 NOTE — Telephone Encounter (Signed)
 Requested medication (s) are due for refill today: na   Requested medication (s) are on the active medication list: yes   Last refill:  02/28/24 #60 1 refills  Future visit scheduled: yes 04/24/24  Notes to clinic:  not delegated per protocol. Do you want to refill Rx?     Requested Prescriptions  Pending Prescriptions Disp Refills   cyclobenzaprine  (FLEXERIL ) 10 MG tablet [Pharmacy Med Name: CYCLOBENZAPRINE  10 MG TABLET] 60 tablet 1    Sig: TAKE 1 TABLET BY MOUTH THREE TIMES A DAY AS NEEDED FOR MUSCLE SPASMS     Not Delegated - Analgesics:  Muscle Relaxants Failed - 04/18/2024  1:14 PM      Failed - This refill cannot be delegated      Passed - Valid encounter within last 6 months    Recent Outpatient Visits           1 week ago Acute bronchitis, unspecified organism   [place] [facility] [place], Megan P, DO   3 weeks ago Chronic atrial fibrillation (HCC)   [place] [facility] [place], Melanie T, NP   1 month ago Chronic atrial fibrillation (HCC)   [place] [facility] [place], Melanie T, NP   2 months ago Cellulitis of left leg   [place] [facility] [place], [place] T, NP   2 months ago Type 2 diabetes mellitus with proteinuria [facility])    [facility] [place], Melanie DASEN, NP

## 2024-04-21 ENCOUNTER — Other Ambulatory Visit: Payer: Self-pay | Admitting: Nurse Practitioner

## 2024-04-21 DIAGNOSIS — E1159 Type 2 diabetes mellitus with other circulatory complications: Secondary | ICD-10-CM

## 2024-04-21 NOTE — Patient Instructions (Signed)

## 2024-04-23 NOTE — Telephone Encounter (Signed)
 Requested Prescriptions  Pending Prescriptions Disp Refills   carvedilol  (COREG ) 25 MG tablet [Pharmacy Med Name: CARVEDILOL  25 MG TABLET] 60 tablet 0    Sig: TAKE 1 TABLET (25 MG TOTAL) BY MOUTH TWICE A DAY WITH MEALS     Cardiovascular: Beta Blockers 3 Failed - 04/23/2024  4:26 PM      Failed - Last BP in normal range    BP Readings from Last 1 Encounters:  04/09/24 (!) 160/90         Passed - Cr in normal range and within 360 days    Creatinine, Ser  Date Value Ref Range Status  01/23/2024 1.12 0.76 - 1.27 mg/dL Final         Passed - AST in normal range and within 360 days    AST  Date Value Ref Range Status  01/23/2024 19 0 - 40 IU/L Final         Passed - ALT in normal range and within 360 days    ALT  Date Value Ref Range Status  01/23/2024 29 0 - 44 IU/L Final         Passed - Last Heart Rate in normal range    Pulse Readings from Last 1 Encounters:  04/09/24 73         Passed - Valid encounter within last 6 months    Recent Outpatient Visits           2 weeks ago Acute bronchitis, unspecified organism   [place] [facility] [place], Megan P, DO   3 weeks ago Chronic atrial fibrillation (HCC)   [place] [facility] [place], Medora T, NP   1 month ago Chronic atrial fibrillation (HCC)   [place] [facility] [place], Brenton T, NP   2 months ago Cellulitis of left leg   [place] [place], Ken[place] T, NP   3 months ago Type 2 diabetes mellitus with proteinuria [facility])   [place] [facility] [place], Melanie DASEN, NP

## 2024-04-24 ENCOUNTER — Ambulatory Visit: Admitting: Nurse Practitioner

## 2024-04-24 ENCOUNTER — Encounter: Payer: Self-pay | Admitting: Nurse Practitioner

## 2024-04-24 ENCOUNTER — Telehealth: Payer: Self-pay

## 2024-04-24 VITALS — BP 128/77 | HR 69 | Temp 99.5°F | Resp 17 | Ht 65.98 in | Wt 254.4 lb

## 2024-04-24 DIAGNOSIS — Z23 Encounter for immunization: Secondary | ICD-10-CM | POA: Diagnosis not present

## 2024-04-24 DIAGNOSIS — N400 Enlarged prostate without lower urinary tract symptoms: Secondary | ICD-10-CM | POA: Diagnosis not present

## 2024-04-24 DIAGNOSIS — Z952 Presence of prosthetic heart valve: Secondary | ICD-10-CM | POA: Diagnosis not present

## 2024-04-24 DIAGNOSIS — E1169 Type 2 diabetes mellitus with other specified complication: Secondary | ICD-10-CM

## 2024-04-24 DIAGNOSIS — E1159 Type 2 diabetes mellitus with other circulatory complications: Secondary | ICD-10-CM

## 2024-04-24 DIAGNOSIS — D6869 Other thrombophilia: Secondary | ICD-10-CM

## 2024-04-24 DIAGNOSIS — M79622 Pain in left upper arm: Secondary | ICD-10-CM | POA: Diagnosis not present

## 2024-04-24 DIAGNOSIS — I482 Chronic atrial fibrillation, unspecified: Secondary | ICD-10-CM

## 2024-04-24 DIAGNOSIS — E1129 Type 2 diabetes mellitus with other diabetic kidney complication: Secondary | ICD-10-CM

## 2024-04-24 DIAGNOSIS — E1142 Type 2 diabetes mellitus with diabetic polyneuropathy: Secondary | ICD-10-CM | POA: Diagnosis not present

## 2024-04-24 DIAGNOSIS — E66813 Obesity, class 3: Secondary | ICD-10-CM

## 2024-04-24 DIAGNOSIS — Z7901 Long term (current) use of anticoagulants: Secondary | ICD-10-CM | POA: Diagnosis not present

## 2024-04-24 DIAGNOSIS — E119 Type 2 diabetes mellitus without complications: Secondary | ICD-10-CM

## 2024-04-24 LAB — COAGUCHEK XS/INR WAIVED
INR: 3.7 — ABNORMAL HIGH (ref 0.9–1.1)
Prothrombin Time: 44.6 s

## 2024-04-24 LAB — BAYER DCA HB A1C WAIVED: HB A1C (BAYER DCA - WAIVED): 7.8 % — ABNORMAL HIGH (ref 4.8–5.6)

## 2024-04-24 MED ORDER — ATORVASTATIN CALCIUM 80 MG PO TABS: 80.0000 mg | ORAL_TABLET | Freq: Every day | ORAL | 3 refills | Status: AC

## 2024-04-24 NOTE — Assessment & Plan Note (Signed)
 Chronic, ongoing.  A1c 7.8% today despite recent steroid use. Urine ALB 16 November 2023. Cannot take Metformin  as caused reduced kidney function. Cannot take Glipizide due to risk for interaction with Coumadin . For now continue current regimen + collaboration with [facility] PharmD.  Jardiance  offers benefit to BP. Discussed at length his very high risk with poor diabetes control - we have had multiple discussions on this.   - Refuses Flu, PCV20 in office today - ACE and statin on board - Eye and foot exams up to date

## 2024-04-24 NOTE — Assessment & Plan Note (Signed)
 Chronic, ongoing.  A1c 7.8% today despite recent steroid use. Cannot take Metformin  as caused reduced kidney function. Cannot take Glipizide due to risk for interaction with Coumadin . For now continue current regimen + collaboration with [facility] PharmD.  Jardiance  offers benefit to BP. Discussed at length his very high risk with poor diabetes control - we have had multiple discussions on this.  Continue Gabapentin , he is self reducing. - Refuses Flu, PCV20 in office today - ACE and statin on board - Eye and foot exams up to date

## 2024-04-24 NOTE — Assessment & Plan Note (Signed)
 Chronic, stable.  No medications.  PSA today.

## 2024-04-24 NOTE — Assessment & Plan Note (Signed)
 Refer to mechanical valve plan of care.

## 2024-04-24 NOTE — Assessment & Plan Note (Signed)
 Chronic, ongoing.  BP at goal today. Continue Benzapril, HCTZ, Coreg  + Amlodipine , educated him on these medications and use + side effects.  Highly recommend he monitor BP at home at least a few mornings a week + focus on DASH diet.  Discussed with him stroke prevention goal for BP <130/80.  LABS: CBC, CMP, TSH.  Continue cardiology collaboration.  [facility] Pharm[facility].

## 2024-04-24 NOTE — Telephone Encounter (Signed)
-----   Message from Jolene Cannady, NP sent at 04/24/2024  1:02 PM EST ----- Regarding: INR I forgot to alert him to hold Coumadin  tonight and then restart at current dose tomorrow. Just going to hold one dose due to mild elevation INR.

## 2024-04-24 NOTE — Telephone Encounter (Signed)
 Tried calling patient's wife, no answer and no VM.  OK for E2C2 to relay message to the patient and/or his wife if they call back.

## 2024-04-24 NOTE — Assessment & Plan Note (Signed)
 Chronic, ongoing.  A1c remains 7.8% despite recent steroid use.  Cannot take Metformin  as caused reduced kidney function. Cannot take Glipizide due to risk for interaction with Coumadin . For now continue current regimen + collaboration with [facility] and endocrinology (returns to them in February).  Jardiance  offers benefit to BP. Discussed at length his very high risk with poor diabetes control - we have had multiple discussions on this.   - Refuses Flu, agrees to PCV 20 today. - ACE and statin on board - Eye and foot exams up to date

## 2024-04-24 NOTE — Assessment & Plan Note (Signed)
 No masses noted on exam. Continue muscle relaxer as needed. ?some lymph node inflammation.  Apply ice to area at home as needed. Labs today. Will send for ultrasound axilla area.

## 2024-04-24 NOTE — Assessment & Plan Note (Addendum)
 Chronic, ongoing with Coumadin  use. INR 3.7 today, slightly above goal range of 2.5 to 3.5.  Had abx therapy recently. Continue Coumadin  11 MG once a week and 10 MG remainder of days at this time, as suspect recent abx therapy pushed INR up a little. Not to take Ibuprofen due to risk for bleeding with this and only use Tylenol  or OTC Voltaren gel or Icy/Hot patches. Remain off Fenofibrate  and fish oil. No Rosuvastatin, return to Atorvastatin .

## 2024-04-24 NOTE — Assessment & Plan Note (Addendum)
 BMI 41.08 with T2DM, HTN/HLD, A-Fib.  Recommended eating smaller high protein, low fat meals more frequently and exercising 30 mins a day 5 times a week with a goal of 10-15lb weight loss in the next 3 months. Patient voiced their understanding and motivation to adhere to these recommendations.

## 2024-04-24 NOTE — Assessment & Plan Note (Signed)
 Chronic, ongoing with Coumadin  use.  Continue collaboration with cardiology, last note reviewed.  Continue all medications.

## 2024-04-24 NOTE — Telephone Encounter (Signed)
 Tried calling patient. No answer and no VM. Will try to call again shortly.

## 2024-04-24 NOTE — Progress Notes (Signed)
 BP 128/77 (BP Location: Left Arm, Patient Position: Sitting, Cuff Size: Large)   Pulse 69   Temp 99.5 F (37.5 C) (Oral)   Resp 17   Ht 5' 5.98 (1.676 m)   Wt 254 lb 6.4 oz (115.4 kg)   SpO2 90%   BMI 41.08 kg/m    Subjective:    Patient ID: Rodney ONEIDA Kobus [name], male    DOB: [birth year], 63 y.o.   MRN: [id number]  HPI: Rodney Dixon. is a 63 y.o. male  Chief Complaint  Patient presents with   Coagulation Disorder   Underarm pain    Still some puffy at times. Pain goes down his side. Started right before his visit in November.    Medication Problem    [facility] told him he should not be taking Wafarin with the Rosuvastatin.    Still having discomfort under left axilla, takes muscle relaxer and this helps a little bit.  DIABETES A1c in August 7.8% with endo. Last visit 12/14/23 and due for follow up on 06/19/24.  Takes Basaglar  60 units, Humalog  16 units as needed before meals, Jardiance .  Takes Gabapentin  daily for neuropathy  Took steroids recently for acute illness. Hypoglycemic episodes: no Polydipsia/polyuria: no Visual disturbance: no Chest pain: no Paresthesias: no Glucose Monitoring: yes  Accucheck frequency: not checking  Fasting glucose:   Post prandial:  Evening:  Before meals: Taking Insulin ?: yes -- as above  Long acting insulin :  Short acting insulin :  Blood Pressure Monitoring: not checking Retinal Examination: Up To Date -- [facility] Exam: Up to Date Pneumovax: Up to Date Influenza: Not Up To Date Aspirin : no   HYPERTENSION / HYPERLIPIDEMIA Continues Benazepril , HCTZ, Amlodipine , Carvedilol , and Rosuvastatin. Echo noted normal LV function and EF >55% 03/13/2020. Saw cardiology last on 03/17/23. Duration of hypertension: chronic BP monitoring frequency: not checking BP range:  BP medication side effects: no Duration of hyperlipidemia: chronic Cholesterol medication side effects: no Cholesterol supplements: none Medication compliance: good  compliance Aspirin : no Recent stressors: no Recurrent headaches: no Visual changes: no Palpitations: no Dyspnea: no Chest pain: no Lower extremity edema: no Dizzy/lightheaded: no  ATRIAL FIBRILLATION Continues Coumadin  11 MG once a week and 10 MG remainder of days  History of stroke, has not seen neurology since 03/30/21.  CT remained reassuring on 05/12/21. Had recent abx therapy and Prednisone . Atrial fibrillation status: stable Satisfied with current treatment: yes  Medication side effects:  no Medication compliance: good compliance Etiology of atrial fibrillation: unknown Palpitations:  no Chest pain:  no Dyspnea on exertion:  no Orthopnea:  no Syncope:  no Edema:  no Ventricular rate control: B-blocker Anti-coagulation: warfarin   Relevant past medical, surgical, family and social history reviewed and updated as indicated. Interim medical history since our last visit reviewed. Allergies and medications reviewed and updated.  Review of Systems  Constitutional:  Negative for activity change, diaphoresis, fatigue and fever.  Respiratory:  Negative for cough, chest tightness, shortness of breath and wheezing.   Cardiovascular:  Negative for chest pain, palpitations and leg swelling.  Gastrointestinal: Negative.   Endocrine: Negative for polydipsia, polyphagia and polyuria.  Genitourinary:  Negative for hematuria and urgency.  Neurological:  Negative for dizziness, facial asymmetry, speech difficulty, weakness and headaches.  Psychiatric/Behavioral: Negative.     Per HPI unless specifically indicated above     Objective:    BP 128/77 (BP Location: Left Arm, Patient Position: Sitting, Cuff Size: Large)   Pulse 69   Temp 99.5  F (37.5 C) (Oral)   Resp 17   Ht 5' 5.98 (1.676 m)   Wt 254 lb 6.4 oz (115.4 kg)   SpO2 90%   BMI 41.08 kg/m   Wt Readings from Last 3 Encounters:  04/24/24 254 lb 6.4 oz (115.4 kg)  04/09/24 254 lb (115.2 kg)  03/27/24 250 lb (113.4 kg)     Physical Exam Vitals and nursing note reviewed.  Constitutional:      General: He is awake. He is not in acute distress.    Appearance: He is well-developed and well-groomed. He is morbidly obese. He is not ill-appearing.  HENT:     Head: Normocephalic and atraumatic.     Right Ear: Hearing normal. No drainage.     Left Ear: Hearing normal. No drainage.  Eyes:     General: Lids are normal.        Right eye: No discharge.        Left eye: No discharge.     Conjunctiva/sclera: Conjunctivae normal.     Pupils: Pupils are equal, round, and reactive to light.  Neck:     Thyroid : No thyromegaly.     Vascular: No carotid bruit or JVD.  Cardiovascular:     Rate and Rhythm: Normal rate and regular rhythm.     Heart sounds: Normal heart sounds, S1 normal and S2 normal. No murmur heard.    No gallop.  Pulmonary:     Effort: Pulmonary effort is normal. No accessory muscle usage or respiratory distress.     Breath sounds: Normal breath sounds. No decreased breath sounds, wheezing or rales.  Chest:     Comments: No rashes noted. Abdominal:     General: Bowel sounds are normal.     Palpations: Abdomen is soft.  Musculoskeletal:        General: Normal range of motion.     Right shoulder: Normal.     Left shoulder: Normal.     Cervical back: Normal range of motion and neck supple.     Right lower leg: No edema.     Left lower leg: No edema.  Lymphadenopathy:     Upper Body:     Right upper body: No supraclavicular, axillary or pectoral adenopathy.     Left upper body: No supraclavicular, axillary or pectoral adenopathy.  Skin:    General: Skin is warm and dry.     Capillary Refill: Capillary refill takes less than 2 seconds.     Findings: No rash.  Neurological:     Mental Status: He is alert and oriented to person, place, and time.     Cranial Nerves: Cranial nerves 2-12 are intact.     Motor: Motor function is intact.     Coordination: Coordination is intact.     Gait: Gait is  intact.     Deep Tendon Reflexes: Reflexes are normal and symmetric.  Psychiatric:        Attention and Perception: Attention normal.        Mood and Affect: Mood normal.        Speech: Speech normal.        Behavior: Behavior normal. Behavior is cooperative.        Thought Content: Thought content normal.    Results for orders placed or performed in visit on 04/24/24  Bayer DCA Hb A1c Waived   Collection Time: 04/24/24 10:13 AM  Result Value Ref Range   HB A1C (BAYER DCA - WAIVED) 7.8 (H) 4.8 -  5.6 %  CoaguChek XS/INR Waived   Collection Time: 04/24/24 10:13 AM  Result Value Ref Range   INR 3.7 (H) 0.9 - 1.1   Prothrombin Time 44.6 sec      Assessment & Plan:   Problem List Items Addressed This Visit       Cardiovascular and Mediastinum   Chronic atrial fibrillation (HCC) (Chronic)   Chronic, ongoing with Coumadin  use.  Continue collaboration with cardiology, last note reviewed.  Continue all medications.      Relevant Medications   atorvastatin  (LIPITOR) 80 MG tablet   Hypertension associated with diabetes (HCC)   Chronic, ongoing.  BP at goal today. Continue Benzapril, HCTZ, Coreg  + Amlodipine , educated him on these medications and use + side effects.  Highly recommend he monitor BP at home at least a few mornings a week + focus on DASH diet.  Discussed with him stroke prevention goal for BP <130/80.  LABS: CBC, CMP, TSH.  Continue cardiology collaboration.  [facility] to collaborate with [facility].        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  A1c 7.8% today despite recent steroid use. Urine ALB 16 November 2023. Cannot take Metformin  as caused reduced kidney function. Cannot take Glipizide due to risk for  interaction with Coumadin . For now continue current regimen + collaboration with [facility].  Jardiance  offers benefit to BP. Discussed at length his very high risk with poor diabetes control - we have had multiple discussions on this.   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  A1c 7.8% today despite recent steroid use. Cannot take Metformin  as caused reduced kidney function. Cannot take Glipizide due to risk for interaction with Coumadin . For now continue current regimen + collaboration with [facility].  Jardiance  offers benefit to BP. Discussed at length his very high risk with poor diabetes control - we have had multiple discussions on this.  Continue Gabapentin , he is self reducing. - Refuses Flu, PCV20 in office today - ACE and statin on board - Eye and foot exams up to date      Relevant Medications   atorvastatin  (LIPITOR) 80 MG tablet   Other Relevant Orders   Bayer DCA Hb A1c Waived (Completed)   Hyperlipidemia associated with type 2 diabetes mellitus (HCC) (Chronic)   Chronic, ongoing.  Continue current medication regimen and adjust as needed, will return to Atorvastatin  and stop Rosuvastatin.  Monitor statin use closely with Coumadin .  Lipid panel today. Discussed plan of care with patient. May need injectable in future if unable to get LDL to goa <55.      Relevant Medications   atorvastatin  (LIPITOR) 80 MG tablet   Other Relevant Orders   Bayer DCA Hb A1c Waived (Completed)   Comprehensive metabolic panel with GFR   Lipid Panel w/o Chol/HDL Ratio   Insulin  dependent type 2 diabetes mellitus (HCC) - Primary  Chronic, ongoing.  A1c remains 7.8% despite recent steroid use.  Cannot take Metformin  as  caused reduced kidney function. Cannot take Glipizide due to risk for interaction with Coumadin . For now continue current regimen + collaboration with [facility] and endocrinology (returns to them in February).  Jardiance  offers benefit to BP. Discussed at length his very high risk with poor diabetes control - we have had multiple discussions on this.   - Refuses Flu, agrees to PCV 20 today. - ACE and statin on board - Eye and foot exams up to date      Relevant Medications   atorvastatin  (LIPITOR) 80 MG tablet   Other Relevant Orders   Bayer DCA Hb A1c Waived (Completed)     Genitourinary   Benign prostatic hyperplasia without lower urinary tract symptoms   Chronic, stable.  No medications.  PSA today.      Relevant Orders   PSA     Other   Current use of long term anticoagulation (Chronic)   Refer to mechanical valve plan of care.      Relevant Orders   CoaguChek XS/INR Waived (Completed)   Pain in left axilla   No masses noted on exam. Continue muscle relaxer as needed. ?some lymph node inflammation.  Apply ice to area at home as needed. Labs today. Will send for ultrasound axilla area.      Relevant Orders   US  AXILLA LEFT   Obesity, Class III, BMI 40-49.9 (morbid obesity) (HCC)   BMI 41.08 with T2DM, HTN/HLD, A-Fib.  Recommended eating smaller high protein, low fat meals more frequently and exercising 30 mins a day 5 times a week with a goal of 10-15lb weight loss in the next 3 months. Patient voiced their understanding and motivation to adhere to these recommendations.       Mechanical heart valve present   Chronic, ongoing with Coumadin  use. INR 3.7 today, slightly above goal range of 2.5 to 3.5.  Had abx therapy recently. Continue Coumadin  11 MG once a week and 10 MG remainder of days at this time, as suspect recent abx therapy pushed INR up a little. Not to take Ibuprofen due to risk for bleeding with this and only use Tylenol  or OTC Voltaren gel or Icy/Hot patches.  Remain off Fenofibrate  and fish oil. No Rosuvastatin, return to Atorvastatin .      Relevant Orders   CoaguChek XS/INR Waived (Completed)   Other Visit Diagnoses       Pneumococcal vaccination given       PCV20 in office today, educated patient.   Relevant Orders   Pneumococcal conjugate vaccine 20-valent (Completed)        Follow up plan: Return in about 2 weeks (around 05/08/2024) for INR CHECK.

## 2024-04-24 NOTE — Assessment & Plan Note (Signed)
 Chronic, ongoing.  Continue current medication regimen and adjust as needed, will return to Atorvastatin  and stop Rosuvastatin.  Monitor statin use closely with Coumadin .  Lipid panel today. Discussed plan of care with patient. May need injectable in future if unable to get LDL to goa <55.

## 2024-04-25 ENCOUNTER — Ambulatory Visit: Payer: Self-pay | Admitting: Nurse Practitioner

## 2024-04-25 LAB — COMPREHENSIVE METABOLIC PANEL WITH GFR
ALT: 43 IU/L (ref 0–44)
AST: 29 IU/L (ref 0–40)
Albumin: 4.2 g/dL (ref 3.9–4.9)
Alkaline Phosphatase: 67 IU/L (ref 47–123)
BUN/Creatinine Ratio: 18 (ref 10–24)
BUN: 21 mg/dL (ref 8–27)
Bilirubin Total: 0.4 mg/dL (ref 0.0–1.2)
CO2: 27 mmol/L (ref 20–29)
Calcium: 9.3 mg/dL (ref 8.6–10.2)
Chloride: 100 mmol/L (ref 96–106)
Creatinine, Ser: 1.15 mg/dL (ref 0.76–1.27)
Globulin, Total: 2.7 g/dL (ref 1.5–4.5)
Glucose: 180 mg/dL — ABNORMAL HIGH (ref 70–99)
Potassium: 4.5 mmol/L (ref 3.5–5.2)
Sodium: 140 mmol/L (ref 134–144)
Total Protein: 6.9 g/dL (ref 6.0–8.5)
eGFR: 71 mL/min/1.73 (ref >59)

## 2024-04-25 LAB — CBC WITH DIFFERENTIAL/PLATELET
Basophils Absolute: 0 x10E3/uL (ref 0.0–0.2)
Basos: 1 %
EOS (ABSOLUTE): 0.2 x10E3/uL (ref 0.0–0.4)
Eos: 3 %
Hematocrit: 47.6 % (ref 37.5–51.0)
Hemoglobin: 15.3 g/dL (ref 13.0–17.7)
Immature Grans (Abs): 0 x10E3/uL (ref 0.0–0.1)
Immature Granulocytes: 0 %
Lymphocytes Absolute: 1.9 x10E3/uL (ref 0.7–3.1)
Lymphs: 23 %
MCH: 28.5 pg (ref 26.6–33.0)
MCHC: 32.1 g/dL (ref 31.5–35.7)
MCV: 89 fL (ref 79–97)
Monocytes Absolute: 0.7 x10E3/uL (ref 0.1–0.9)
Monocytes: 8 %
Neutrophils Absolute: 5.6 x10E3/uL (ref 1.4–7.0)
Neutrophils: 65 %
Platelets: 195 x10E3/uL (ref 150–450)
RBC: 5.36 x10E6/uL (ref 4.14–5.80)
RDW: 13.5 % (ref 11.6–15.4)
WBC: 8.5 x10E3/uL (ref 3.4–10.8)

## 2024-04-25 LAB — LIPID PANEL W/O CHOL/HDL RATIO
Cholesterol, Total: 153 mg/dL (ref 100–199)
HDL: 26 mg/dL — ABNORMAL LOW (ref >39)
LDL Chol Calc (NIH): 73 mg/dL (ref 0–99)
Triglycerides: 339 mg/dL — ABNORMAL HIGH (ref 0–149)
VLDL Cholesterol Cal: 54 mg/dL — ABNORMAL HIGH (ref 5–40)

## 2024-04-25 LAB — PSA: Prostate Specific Ag, Serum: 0.5 ng/mL (ref 0.0–4.0)

## 2024-04-25 LAB — TSH: TSH: 1.05 u[IU]/mL (ref 0.450–4.500)

## 2024-04-25 NOTE — Telephone Encounter (Signed)
 Verifying since we did not reach yesterday should he hold his next dose and then resume as normal?

## 2024-04-25 NOTE — Telephone Encounter (Signed)
 Reviewed with patient. He will hold tonight's dose and resume as normal tomorrow night.

## 2024-04-25 NOTE — Progress Notes (Signed)
 Good morning, please let Rodney Dixon know his labs have returned: - Kidney and liver function are normal. - Lipid panel continues to show elevation in triglycerides and LDL went up a little with Rosuvastatin. As we discussed stop this and restart Atorvastatin  which I sent in. Continue to work on healthy diet changes. - Remainder of labs, including prostate blood work, is normal. Any questions? Keep being amazing!!  Thank you for allowing me to participate in your care.  I appreciate you. Kindest regards, Naevia Unterreiner

## 2024-05-11 NOTE — Patient Instructions (Incomplete)
 Start 11 MG Coumadin  daily and we will recheck INR in 2 weeks.  Heart-Healthy Eating Plan Many factors influence your heart health, including eating and exercise habits. Heart health is also called coronary health. Coronary risk increases with abnormal blood fat (lipid) levels. A heart-healthy eating plan includes limiting unhealthy fats, increasing healthy fats, limiting salt (sodium) intake, and making other diet and lifestyle changes. What is my plan? Your health care provider may recommend that: You limit your fat intake to _________% or less of your total calories each day. You limit your saturated fat intake to _________% or less of your total calories each day. You limit the amount of cholesterol in your diet to less than _________ mg per day. You limit the amount of sodium in your diet to less than _________ mg per day. What are tips for following this plan? Cooking Cook foods using methods other than frying. Baking, boiling, grilling, and broiling are all good options. Other ways to reduce fat include: Removing the skin from poultry. Removing all visible fats from meats. Steaming vegetables in water or broth. Meal planning  At meals, imagine dividing your plate into fourths: Fill one-half of your plate with vegetables and green salads. Fill one-fourth of your plate with whole grains. Fill one-fourth of your plate with lean protein foods. Eat 2-4 cups of vegetables per day. One cup of vegetables equals 1 cup (91 g) broccoli or cauliflower florets, 2 medium carrots, 1 large bell pepper, 1 large sweet potato, 1 large tomato, 1 medium white potato, 2 cups (150 g) raw leafy greens. Eat 1-2 cups of fruit per day. One cup of fruit equals 1 small apple, 1 large banana, 1 cup (237 g) mixed fruit, 1 large orange,  cup (82 g) dried fruit, 1 cup (240 mL) 100% fruit juice. Eat more foods that contain soluble fiber. Examples include apples, broccoli, carrots, beans, peas, and barley. Aim to  get 25-30 g of fiber per day. Increase your consumption of legumes, nuts, and seeds to 4-5 servings per week. One serving of dried beans or legumes equals  cup (90 g) cooked, 1 serving of nuts is  oz (12 almonds, 24 pistachios, or 7 walnut halves), and 1 serving of seeds equals  oz (8 g). Fats Choose healthy fats more often. Choose monounsaturated and polyunsaturated fats, such as olive and canola oils, avocado oil, flaxseeds, walnuts, almonds, and seeds. Eat more omega-3 fats. Choose salmon, mackerel, sardines, tuna, flaxseed oil, and ground flaxseeds. Aim to eat fish at least 2 times each week. Check food labels carefully to identify foods with trans fats or high amounts of saturated fat. Limit saturated fats. These are found in animal products, such as meats, butter, and cream. Plant sources of saturated fats include palm oil, palm kernel oil, and coconut oil. Avoid foods with partially hydrogenated oils in them. These contain trans fats. Examples are stick margarine, some tub margarines, cookies, crackers, and other baked goods. Avoid fried foods. General information Eat more home-cooked food and less restaurant, buffet, and fast food. Limit or avoid alcohol. Limit foods that are high in added sugar and simple starches such as foods made using white refined flour (white breads, pastries, sweets). Lose weight if you are overweight. Losing just 5-10% of your body weight can help your overall health and prevent diseases such as diabetes and heart disease. Monitor your sodium intake, especially if you have high blood pressure. Talk with your health care provider about your sodium intake. Try to incorporate more  vegetarian meals weekly. What foods should I eat? Fruits All fresh, canned (in natural juice), or frozen fruits. Vegetables Fresh or frozen vegetables (raw, steamed, roasted, or grilled). Green salads. Grains Most grains. Choose whole wheat and whole grains most of the time. Rice and  pasta, including brown rice and pastas made with whole wheat. Meats and other proteins Lean, well-trimmed beef, veal, pork, and lamb. Chicken and turkey without skin. All fish and shellfish. Wild duck, rabbit, pheasant, and venison. Egg whites or low-cholesterol egg substitutes. Dried beans, peas, lentils, and tofu. Seeds and most nuts. Dairy Low-fat or nonfat cheeses, including ricotta and mozzarella. Skim or 1% milk (liquid, powdered, or evaporated). Buttermilk made with low-fat milk. Nonfat or low-fat yogurt. Fats and oils Non-hydrogenated (trans-free) margarines. Vegetable oils, including soybean, sesame, sunflower, olive, avocado, peanut, safflower, corn, canola, and cottonseed. Salad dressings or mayonnaise made with a vegetable oil. Beverages Water (mineral or sparkling). Coffee and tea. Unsweetened ice tea. Diet beverages. Sweets and desserts Sherbet, gelatin, and fruit ice. Small amounts of dark chocolate. Limit all sweets and desserts. Seasonings and condiments All seasonings and condiments. The items listed above may not be a complete list of foods and beverages you can eat. Contact a dietitian for more options. What foods should I avoid? Fruits Canned fruit in heavy syrup. Fruit in cream or butter sauce. Fried fruit. Limit coconut. Vegetables Vegetables cooked in cheese, cream, or butter sauce. Fried vegetables. Grains Breads made with saturated or trans fats, oils, or whole milk. Croissants. Sweet rolls. Donuts. High-fat crackers, such as cheese crackers and chips. Meats and other proteins Fatty meats, such as hot dogs, ribs, sausage, bacon, rib-eye roast or steak. High-fat deli meats, such as salami and bologna. Caviar. Domestic duck and goose. Organ meats, such as liver. Dairy Cream, sour cream, cream cheese, and creamed cottage cheese. Whole-milk cheeses. Whole or 2% milk (liquid, evaporated, or condensed). Whole buttermilk. Cream sauce or high-fat cheese sauce. Whole-milk  yogurt. Fats and oils Meat fat, or shortening. Cocoa butter, hydrogenated oils, palm oil, coconut oil, palm kernel oil. Solid fats and shortenings, including bacon fat, salt pork, lard, and butter. Nondairy cream substitutes. Salad dressings with cheese or sour cream. Beverages Regular sodas and any drinks with added sugar. Sweets and desserts Frosting. Pudding. Cookies. Cakes. Pies. Milk chocolate or white chocolate. Buttered syrups. Full-fat ice cream or ice cream drinks. The items listed above may not be a complete list of foods and beverages to avoid. Contact a dietitian for more information. Summary Heart-healthy meal planning includes limiting unhealthy fats, increasing healthy fats, limiting salt (sodium) intake and making other diet and lifestyle changes. Lose weight if you are overweight. Losing just 5-10% of your body weight can help your overall health and prevent diseases such as diabetes and heart disease. Focus on eating a balance of foods, including fruits and vegetables, low-fat or nonfat dairy, lean protein, nuts and legumes, whole grains, and heart-healthy oils and fats. This information is not intended to replace advice given to you by your health care provider. Make sure you discuss any questions you have with your health care provider. Document Revised: 05/31/2021 Document Reviewed: 05/31/2021 Elsevier Patient Education  2024 [employer].

## 2024-05-15 ENCOUNTER — Encounter: Payer: Self-pay | Admitting: Nurse Practitioner

## 2024-05-15 ENCOUNTER — Other Ambulatory Visit: Payer: Self-pay | Admitting: Nurse Practitioner

## 2024-05-15 ENCOUNTER — Ambulatory Visit: Payer: Self-pay | Admitting: Nurse Practitioner

## 2024-05-15 VITALS — BP 123/75 | HR 72 | Temp 98.0°F | Resp 17 | Ht 65.98 in | Wt 252.2 lb

## 2024-05-15 DIAGNOSIS — M79622 Pain in left upper arm: Secondary | ICD-10-CM

## 2024-05-15 DIAGNOSIS — I482 Chronic atrial fibrillation, unspecified: Secondary | ICD-10-CM | POA: Diagnosis not present

## 2024-05-15 DIAGNOSIS — Z7901 Long term (current) use of anticoagulants: Secondary | ICD-10-CM | POA: Diagnosis not present

## 2024-05-15 DIAGNOSIS — R928 Other abnormal and inconclusive findings on diagnostic imaging of breast: Secondary | ICD-10-CM

## 2024-05-15 DIAGNOSIS — R599 Enlarged lymph nodes, unspecified: Secondary | ICD-10-CM

## 2024-05-15 DIAGNOSIS — Z952 Presence of prosthetic heart valve: Secondary | ICD-10-CM

## 2024-05-15 LAB — COAGUCHEK XS/INR WAIVED
INR: 2 — ABNORMAL HIGH (ref 0.9–1.1)
Prothrombin Time: 23.5 s

## 2024-05-15 NOTE — Assessment & Plan Note (Signed)
 Refer to mechanical valve plan of care.

## 2024-05-15 NOTE — Assessment & Plan Note (Signed)
 Chronic, ongoing with Coumadin  use.  Continue collaboration with cardiology, last note reviewed.  Continue all medications.

## 2024-05-15 NOTE — Progress Notes (Signed)
 "  BP 123/75 (BP Location: Left Arm, Patient Position: Sitting, Cuff Size: Large)   Pulse 72   Temp 98 F (36.7 C) (Oral)   Resp 17   Ht 5' 5.98 (1.676 m)   Wt 252 lb 3.2 oz (114.4 kg)   SpO2 96%   BMI 40.73 kg/m    Subjective:    Patient ID: Rodney ONEIDA Kobus [name], male    DOB: 07-Mar-1961, 64 y.o.   MRN: [id number]  CC: Coumadin  management  HPI: This patient is a 64 y.o. male who presents for coumadin  management. The expected duration of coumadin  treatment is lifelong The reason for anticoagulation is  mechanical heart valve. Sees cardiology, Dr. Ammon, last on 03/17/2023, no changes. Echo 09/05/23 with LVEF 60-65%, stable valve -- moderate aortic valve stenosis. Held a dose of Coumadin  recently.  Present Coumadin  dose: 11 MG once a week and 10 MG remainder of days Goal: 2.5-3.5 The patient does not have an INR goal. Excessive bruising: no Nose bleeding: no Rectal bleeding: no Prolonged menstrual cycles: N/A Eating diet with consistent amounts of foods containing Vitamin K:no Any recent antibiotic use? no  Relevant past medical, surgical, family and social history reviewed and updated as indicated. Interim medical history since our last visit reviewed. Allergies and medications reviewed and updated.  ROS: Per HPI unless specifically indicated above     Objective:    BP 123/75 (BP Location: Left Arm, Patient Position: Sitting, Cuff Size: Large)   Pulse 72   Temp 98 F (36.7 C) (Oral)   Resp 17   Ht 5' 5.98 (1.676 m)   Wt 252 lb 3.2 oz (114.4 kg)   SpO2 96%   BMI 40.73 kg/m   Wt Readings from Last 3 Encounters:  05/15/24 252 lb 3.2 oz (114.4 kg)  04/24/24 254 lb 6.4 oz (115.4 kg)  04/09/24 254 lb (115.2 kg)     General: Well appearing, well nourished in no distress.  Normal mood and affect. Skin: No excessive bruising or rash  Last INR: 3.7 -- we changed him back to Atorvastatin     Last CBC:  Lab Results  Component Value Date   WBC 8.5 04/24/2024   HGB 15.3  04/24/2024   HCT 47.6 04/24/2024   MCV 89 04/24/2024   PLT 195 04/24/2024    Results for orders placed or performed in visit on 04/24/24  Bayer DCA Hb A1c Waived   Collection Time: 04/24/24 10:13 AM  Result Value Ref Range   HB A1C (BAYER DCA - WAIVED) 7.8 (H) 4.8 - 5.6 %  CoaguChek XS/INR Waived   Collection Time: 04/24/24 10:13 AM  Result Value Ref Range   INR 3.7 (H) 0.9 - 1.1   Prothrombin Time 44.6 sec  CBC with Differential/Platelet   Collection Time: 04/24/24 10:15 AM  Result Value Ref Range   WBC 8.5 3.4 - 10.8 x10E3/uL   RBC 5.36 4.14 - 5.80 x10E6/uL   Hemoglobin 15.3 13.0 - 17.7 g/dL   Hematocrit 52.3 62.4 - 51.0 %   MCV 89 79 - 97 fL   MCH 28.5 26.6 - 33.0 pg   MCHC 32.1 31.5 - 35.7 g/dL   RDW 86.4 88.3 - 84.5 %   Platelets 195 150 - 450 x10E3/uL   Neutrophils 65 Not Estab. %   Lymphs 23 Not Estab. %   Monocytes 8 Not Estab. %   Eos 3 Not Estab. %   Basos 1 Not Estab. %   Neutrophils Absolute 5.6 1.4 - 7.0  x10E3/uL   Lymphocytes Absolute 1.9 0.7 - 3.1 x10E3/uL   Monocytes Absolute 0.7 0.1 - 0.9 x10E3/uL   EOS (ABSOLUTE) 0.2 0.0 - 0.4 x10E3/uL   Basophils Absolute 0.0 0.0 - 0.2 x10E3/uL   Immature Granulocytes 0 Not Estab. %   Immature Grans (Abs) 0.0 0.0 - 0.1 x10E3/uL  Comprehensive metabolic panel with GFR   Collection Time: 04/24/24 10:15 AM  Result Value Ref Range   Glucose 180 (H) 70 - 99 mg/dL   BUN 21 8 - 27 mg/dL   Creatinine, Ser 8.84 0.76 - 1.27 mg/dL   eGFR 71 >40 fO/fpw/8.26   BUN/Creatinine Ratio 18 10 - 24   Sodium 140 134 - 144 mmol/L   Potassium 4.5 3.5 - 5.2 mmol/L   Chloride 100 96 - 106 mmol/L   CO2 27 20 - 29 mmol/L   Calcium  9.3 8.6 - 10.2 mg/dL   Total Protein 6.9 6.0 - 8.5 g/dL   Albumin 4.2 3.9 - 4.9 g/dL   Globulin, Total 2.7 1.5 - 4.5 g/dL   Bilirubin Total 0.4 0.0 - 1.2 mg/dL   Alkaline Phosphatase 67 47 - 123 IU/L   AST 29 0 - 40 IU/L   ALT 43 0 - 44 IU/L  Lipid Panel w/o Chol/HDL Ratio   Collection Time: 04/24/24  10:15 AM  Result Value Ref Range   Cholesterol, Total 153 100 - 199 mg/dL   Triglycerides 660 (H) 0 - 149 mg/dL   HDL 26 (L) >60 mg/dL   VLDL Cholesterol Cal 54 (H) 5 - 40 mg/dL   LDL Chol Calc (NIH) 73 0 - 99 mg/dL  TSH   Collection Time: 04/24/24 10:15 AM  Result Value Ref Range   TSH 1.050 0.450 - 4.500 uIU/mL  PSA   Collection Time: 04/24/24 10:15 AM  Result Value Ref Range   Prostate Specific Ag, Serum 0.5 0.0 - 4.0 ng/mL       Assessment:     ICD-10-CM   1. Chronic atrial fibrillation (HCC)  I48.20 CoaguChek XS/INR Waived    2. Mechanical heart valve present  Z95.2 CoaguChek XS/INR Waived    3. Current use of long term anticoagulation  Z79.01 CoaguChek XS/INR Waived      Plan:   Discussed current plan face-to-face with patient. For coumadin  dosing, adjust dose to taking 11 MG daily. Will plan to recheck INR in 2 weeks. "

## 2024-05-15 NOTE — Assessment & Plan Note (Signed)
 Chronic, ongoing with Coumadin  use. INR 2 today, below goal range of 2.5 to 3.5.  Increase Coumadin  to 11 MG daily. Not to take Ibuprofen due to risk for bleeding with this and only use Tylenol  or OTC Voltaren gel or Icy/Hot patches. Remain off Fenofibrate  and fish oil. No Rosuvastatin, is back to Atorvastatin .

## 2024-05-17 ENCOUNTER — Other Ambulatory Visit: Payer: Self-pay | Admitting: Nurse Practitioner

## 2024-05-17 DIAGNOSIS — E1159 Type 2 diabetes mellitus with other circulatory complications: Secondary | ICD-10-CM

## 2024-05-20 NOTE — Progress Notes (Unsigned)
" ° °  04/17/24  Patient ID: Rodney ONEIDA Kobus [name], male   DOB: 01-23-1961, 64 y.o.   MRN: [id number]  Subjective/Objective Telephone visit to follow-up on management of chronic conditions   Diabetes -Current medications:  Fiasp  16 units TID with meals unless BG < 120 (skip), Basaglar  60 units daily (55 units if FBG <70), Jardiance  25mg  daily -Patient is monitoring home BG and states readings have been elevated (200-300) -Recently completed 5 day course of prednisone  50mg  daily, which has increased BG readings -Patient has all diabetes medications and is taking them as prescribed; no issues refilling Jardiance  consistently recently -Does not endorse any s/sx of hypoglycemia or hyperglycemia -A1c of 7.8% at 8/7 endocrinology visit- no medication changes made at this time -Was scheduled to see endocrinology again in November but canceled appointment (did not want to pay $85 copay) -ACEi/ARB for cardiorenal protection:  benazepril  40mg  daily -UACR 7/8 was 30-300 -Statin for ASCVD risk reduction:  rosuvastatin 40mg  daily  Hypertension -Current medications:  amlodipine  5mg  daily, benazepril  40mg  daily, carvedilol  25mg  BID, hydrochlorothiazide  25mg  daily -Patient was provided BP monitor at 5/19 PCP visit, and he was checking home BP regularly; but batteries in the machine recently died and need to be replaced -BP 160/90 at 12/2 visit with Dr. Vicci; but patient was sick at this time, which could elevate reading  Hyperlipidemia -Current medications:  rosuvastatin 40mg  daily -Recently changed to rosuvastatin 40mg  daily from atorvastatin  based on insurance preference per chart notes- patient has started this medication but is inquiring about potential DDI with warfarin     Component Value Date/Time   CHOL 153 04/24/2024 1015   TRIG 339 (H) 04/24/2024 1015   HDL 26 (L) 04/24/2024 1015   CHOLHDL 4.8 09/05/2023 0242   VLDL 56 (H) 09/05/2023 0242   LDLCALC 73 04/24/2024 1015   LABVLDL 54 (H)  04/24/2024 1015    Assessment/Plan   Diabetes  -Uncontrolled based on A1c goal of <7% -Continue current regimen at this time -Continue to monitor BG regularly -Patient sees PCP 12/17 and will be due for A1c, UACR, CMP and lipid panel -Consider adjusting insulin  based on A1c results since patient did not follow-up with endo in November and is not scheduled to see them again until 2/11.  Home BG currently elevated due to recent steroid therapy for bronchitis, but A1c result will reflect previous 3-4 months of BG control.   Hypertension -Moderately controlled -Continue current regimen at this time -Recommend that patient replace home BP monitor batteries and resume monitoring and recording home BP at least 3x/week -If BP consistently >130/80, consider increasing amlodipine  to 5mg  daily   Hyperlipidemia  -Controlled with LDL <70 -Continue rosuvastatin 40mg  daily -Rosuvastatin can increase effects of warfarin.  Patient currently has INR monitored monthly; may need to decrease dose of warfarin in the future if needed based on INR results.   Follow-up:  1 month- patient states insurance will change at the first of the year, so I will get new plan information and check on coverage/copay of Jardiance  to make sure this will still be affordable   Channing DELENA Mealing, PharmD, DPLA  "

## 2024-05-21 ENCOUNTER — Other Ambulatory Visit: Payer: Self-pay

## 2024-05-21 DIAGNOSIS — E1169 Type 2 diabetes mellitus with other specified complication: Secondary | ICD-10-CM

## 2024-05-21 DIAGNOSIS — Z794 Long term (current) use of insulin: Secondary | ICD-10-CM

## 2024-05-21 DIAGNOSIS — E1129 Type 2 diabetes mellitus with other diabetic kidney complication: Secondary | ICD-10-CM

## 2024-05-21 DIAGNOSIS — E1159 Type 2 diabetes mellitus with other circulatory complications: Secondary | ICD-10-CM

## 2024-05-21 DIAGNOSIS — I152 Hypertension secondary to endocrine disorders: Secondary | ICD-10-CM

## 2024-05-22 ENCOUNTER — Other Ambulatory Visit: Payer: Self-pay | Admitting: Nurse Practitioner

## 2024-05-23 NOTE — Telephone Encounter (Signed)
 Requested Prescriptions  Pending Prescriptions Disp Refills   JARDIANCE  25 MG TABS tablet [Pharmacy Med Name: JARDIANCE  25 MG TABLET] 30 tablet 0    Sig: TAKE 1 TABLET (25 MG TOTAL) BY MOUTH DAILY BEFORE BREAKFAST. TO REPLACE FARXIGA .     Endocrinology:  Diabetes - SGLT2 Inhibitors Passed - 05/23/2024 10:18 AM      Passed - Cr in normal range and within 360 days    Creatinine, Ser  Date Value Ref Range Status  04/24/2024 1.15 0.76 - 1.27 mg/dL Final         Passed - HBA1C is between 0 and 7.9 and within 180 days    Hemoglobin A1C  Date Value Ref Range Status  12/16/2015 10.4  Final   HB A1C (BAYER DCA - WAIVED)  Date Value Ref Range Status  04/24/2024 7.8 (H) 4.8 - 5.6 % Final    Comment:             Prediabetes: 5.7 - 6.4          Diabetes: >6.4          Glycemic control for adults with diabetes: <7.0          Passed - eGFR in normal range and within 360 days    GFR calc Af Amer  Date Value Ref Range Status  06/17/2020 77 >59 mL/min/1.73 Final    Comment:    **In accordance with recommendations from the NKF-ASN Task force,**   [facility] is in the process of updating its eGFR calculation to the   2021 CKD-EPI creatinine equation that estimates kidney function   without a race variable.    GFR, Estimated  Date Value Ref Range Status  09/04/2023 >60 >60 mL/min Final    Comment:    (NOTE) Calculated using the CKD-EPI Creatinine Equation (2021)    eGFR  Date Value Ref Range Status  04/24/2024 71 >59 mL/min/1.73 Final         Passed - Valid encounter within last 6 months    Recent Outpatient Visits           1 week ago Chronic atrial fibrillation (HCC)   [place] [facility] [place], [place] T, NP   4 weeks ago Insulin  dependent type 2 diabetes mellitus (HCC)   [place] [facility] [place], Melanie T, NP   1 month ago Acute bronchitis, unspecified organism   [place] [facility] [place], Megan P, DO   1 month ago  Chronic atrial fibrillation (HCC)   [place] [facility] [place], Melanie T, NP   2 months ago Chronic atrial fibrillation (HCC)   [place] [facility] [place], Jolene T, NP               amLODipine  (NORVASC ) 5 MG tablet [Pharmacy Med Name: AMLODIPINE  BESYLATE 5 MG TAB] 30 tablet 0    Sig: TAKE 1 TABLET (5 MG TOTAL) BY MOUTH DAILY.     Cardiovascular: Calcium  Channel Blockers 2 Passed - 05/23/2024 10:18 AM      Passed - Last BP in normal range    BP Readings from Last 1 Encounters:  05/15/24 123/75         Passed - Last Heart Rate in normal range    Pulse Readings from Last 1 Encounters:  05/15/24 72         Passed - Valid encounter within last 6 months    Recent Outpatient Visits  1 week ago Chronic atrial fibrillation (HCC)   [facility] [place], [place] T, NP   4 weeks ago Insulin  dependent type 2 diabetes mellitus (HCC)   [place] [facility] [place], Melanie T, NP   1 month ago Acute bronchitis, unspecified organism   [place] [facility] [place], Megan P, DO   1 month ago Chronic atrial fibrillation (HCC)   [place] [facility] [place], Melanie T, NP   2 months ago Chronic atrial fibrillation [facility])   [place] [facility] [place], Melanie DASEN, NP

## 2024-05-25 NOTE — Patient Instructions (Incomplete)
Please call to schedule your mammogram and/or bone density: [facility]  Address: [address], [place], [place] [address] Phone: [phone]  [facility] [address] [place],  [place]  [address] Phone: [phone]   Heart-Healthy Eating Plan Many factors influence your heart health, including eating and exercise habits. Heart health is also called coronary health. Coronary risk increases with abnormal blood fat (lipid) levels. A heart-healthy eating plan includes limiting unhealthy fats, increasing healthy fats, limiting salt (sodium) intake, and making other diet and lifestyle changes. What is my plan? Your health care provider may recommend that: You limit your fat intake to _________% or less of your total calories each day. You limit your saturated fat intake to _________% or less of your total calories each day. You limit the amount of cholesterol in your diet to less than _________ mg per day. You limit the amount of sodium in your diet to less than _________ mg per day. What are tips for following this plan? Cooking Cook foods using methods other than frying. Baking, boiling, grilling, and broiling are all good options. Other ways to reduce fat include: Removing the skin from poultry. Removing all visible fats from meats. Steaming vegetables in water or broth. Meal planning  At meals, imagine dividing your plate into fourths: Fill one-half of your plate with vegetables and green salads. Fill one-fourth of your plate with whole grains. Fill one-fourth of your plate with lean protein foods. Eat 2-4 cups of vegetables per day. One cup of vegetables equals 1 cup (91 g) broccoli or cauliflower florets, 2 medium carrots, 1 large bell pepper, 1 large sweet potato, 1 large tomato, 1 medium white potato, 2 cups (150 g) raw leafy greens. Eat 1-2 cups of fruit per day. One cup of fruit equals 1 small  apple, 1 large banana, 1 cup (237 g) mixed fruit, 1 large orange,  cup (82 g) dried fruit, 1 cup (240 mL) 100% fruit juice. Eat more foods that contain soluble fiber. Examples include apples, broccoli, carrots, beans, peas, and barley. Aim to get 25-30 g of fiber per day. Increase your consumption of legumes, nuts, and seeds to 4-5 servings per week. One serving of dried beans or legumes equals  cup (90 g) cooked, 1 serving of nuts is  oz (12 almonds, 24 pistachios, or 7 walnut halves), and 1 serving of seeds equals  oz (8 g). Fats Choose healthy fats more often. Choose monounsaturated and polyunsaturated fats, such as olive and canola oils, avocado oil, flaxseeds, walnuts, almonds, and seeds. Eat more omega-3 fats. Choose salmon, mackerel, sardines, tuna, flaxseed oil, and ground flaxseeds. Aim to eat fish at least 2 times each week. Check food labels carefully to identify foods with trans fats or high amounts of saturated fat. Limit saturated fats. These are found in animal products, such as meats, butter, and cream. Plant sources of saturated fats include palm oil, palm kernel oil, and coconut oil. Avoid foods with partially hydrogenated oils in them. These contain trans fats. Examples are stick margarine, some tub margarines, cookies, crackers, and other baked goods. Avoid fried foods. General information Eat more home-cooked food and less restaurant, buffet, and fast food. Limit or avoid alcohol. Limit foods that are high in added sugar and simple starches such as foods made using white refined flour (white breads, pastries, sweets). Lose weight if you are overweight. Losing just 5-10% of your body weight can help your  overall health and prevent diseases such as diabetes and heart disease. Monitor your sodium intake, especially if you have high blood pressure. Talk with your health care provider about your sodium intake. Try to incorporate more vegetarian meals weekly. What foods should I  eat? Fruits All fresh, canned (in natural juice), or frozen fruits. Vegetables Fresh or frozen vegetables (raw, steamed, roasted, or grilled). Green salads. Grains Most grains. Choose whole wheat and whole grains most of the time. Rice and pasta, including brown rice and pastas made with whole wheat. Meats and other proteins Lean, well-trimmed beef, veal, pork, and lamb. Chicken and [place] without skin. All fish and shellfish. Wild duck, rabbit, pheasant, and venison. Egg whites or low-cholesterol egg substitutes. Dried beans, peas, lentils, and tofu. Seeds and most nuts. Dairy Low-fat or nonfat cheeses, including ricotta and mozzarella. Skim or 1% milk (liquid, powdered, or evaporated). Buttermilk made with low-fat milk. Nonfat or low-fat yogurt. Fats and oils Non-hydrogenated (trans-free) margarines. Vegetable oils, including soybean, sesame, sunflower, olive, avocado, peanut, safflower, corn, canola, and cottonseed. Salad dressings or mayonnaise made with a vegetable oil. Beverages Water (mineral or sparkling). Coffee and tea. Unsweetened ice tea. Diet beverages. Sweets and desserts Sherbet, gelatin, and fruit ice. Small amounts of dark chocolate. Limit all sweets and desserts. Seasonings and condiments All seasonings and condiments. The items listed above may not be a complete list of foods and beverages you can eat. Contact a dietitian for more options. What foods should I avoid? Fruits Canned fruit in heavy syrup. Fruit in cream or butter sauce. Fried fruit. Limit coconut. Vegetables Vegetables cooked in cheese, cream, or butter sauce. Fried vegetables. Grains Breads made with saturated or trans fats, oils, or whole milk. Croissants. Sweet rolls. Donuts. High-fat crackers, such as cheese crackers and chips. Meats and other proteins Fatty meats, such as hot dogs, ribs, sausage, bacon, rib-eye roast or steak. High-fat deli meats, such as salami and bologna. Caviar. Domestic duck and  goose. Organ meats, such as liver. Dairy Cream, sour cream, cream cheese, and creamed cottage cheese. Whole-milk cheeses. Whole or 2% milk (liquid, evaporated, or condensed). Whole buttermilk. Cream sauce or high-fat cheese sauce. Whole-milk yogurt. Fats and oils Meat fat, or shortening. Cocoa butter, hydrogenated oils, palm oil, coconut oil, palm kernel oil. Solid fats and shortenings, including bacon fat, salt pork, lard, and butter. Nondairy cream substitutes. Salad dressings with cheese or sour cream. Beverages Regular sodas and any drinks with added sugar. Sweets and desserts Frosting. Pudding. Cookies. Cakes. Pies. Milk chocolate or white chocolate. Buttered syrups. Full-fat ice cream or ice cream drinks. The items listed above may not be a complete list of foods and beverages to avoid. Contact a dietitian for more information. Summary Heart-healthy meal planning includes limiting unhealthy fats, increasing healthy fats, limiting salt (sodium) intake and making other diet and lifestyle changes. Lose weight if you are overweight. Losing just 5-10% of your body weight can help your overall health and prevent diseases such as diabetes and heart disease. Focus on eating a balance of foods, including fruits and vegetables, low-fat or nonfat dairy, lean protein, nuts and legumes, whole grains, and heart-healthy oils and fats. This information is not intended to replace advice given to you by your health care provider. Make sure you discuss any questions you have with your health care provider. Document Revised: 05/31/2021 Document Reviewed: 05/31/2021 Elsevier Patient Education  2024 [employer].

## 2024-05-29 ENCOUNTER — Telehealth: Payer: Self-pay

## 2024-05-29 ENCOUNTER — Ambulatory Visit: Admitting: Nurse Practitioner

## 2024-05-29 ENCOUNTER — Encounter: Payer: Self-pay | Admitting: Nurse Practitioner

## 2024-05-29 VITALS — BP 132/81 | HR 73 | Temp 98.2°F | Ht 66.0 in | Wt 253.8 lb

## 2024-05-29 DIAGNOSIS — Z794 Long term (current) use of insulin: Secondary | ICD-10-CM | POA: Diagnosis not present

## 2024-05-29 DIAGNOSIS — I482 Chronic atrial fibrillation, unspecified: Secondary | ICD-10-CM

## 2024-05-29 DIAGNOSIS — Z952 Presence of prosthetic heart valve: Secondary | ICD-10-CM | POA: Diagnosis not present

## 2024-05-29 DIAGNOSIS — E1129 Type 2 diabetes mellitus with other diabetic kidney complication: Secondary | ICD-10-CM

## 2024-05-29 DIAGNOSIS — R809 Proteinuria, unspecified: Secondary | ICD-10-CM

## 2024-05-29 DIAGNOSIS — Z7901 Long term (current) use of anticoagulants: Secondary | ICD-10-CM | POA: Diagnosis not present

## 2024-05-29 LAB — COAGUCHEK XS/INR WAIVED
INR: 2.5 — ABNORMAL HIGH (ref 0.9–1.1)
Prothrombin Time: 30.4 s

## 2024-05-29 LAB — MICROALBUMIN, URINE WAIVED
Creatinine, Urine Waived: 50 mg/dL (ref 10–300)
Microalb, Ur Waived: 10 mg/L (ref 0–19)

## 2024-05-29 MED ORDER — WARFARIN SODIUM 1 MG PO TABS: 1.0000 mg | ORAL_TABLET | Freq: Every day | ORAL | 2 refills | Status: AC

## 2024-05-29 NOTE — Assessment & Plan Note (Signed)
 Refer to mechanical valve plan of care.

## 2024-05-29 NOTE — Assessment & Plan Note (Signed)
 Chronic, ongoing with Coumadin  use.  Continue collaboration with cardiology, last note reviewed.  Continue all medications.

## 2024-05-29 NOTE — Progress Notes (Signed)
 "  BP 132/81   Pulse 73   Temp 98.2 F (36.8 C) (Oral)   Ht 5' 6 (1.676 m)   Wt 253 lb 12.8 oz (115.1 kg)   SpO2 97%   BMI 40.96 kg/m    Subjective:    Patient ID: Rodney ONEIDA Kobus [name], male    DOB: 30-Nov-1960, 64 y.o.   MRN: [id number]  CC: Coumadin  management  HPI: This patient is a 64 y.o. male who presents for coumadin  management. The expected duration of coumadin  treatment is lifelong The reason for anticoagulation is  mechanical heart valve. Follows with cardiology, Dr. Ammon, last on 03/17/2023, no changes. Echo 09/05/23 with LVEF 60-65%, stable valve -- moderate aortic valve stenosis.   Present Coumadin  dose: 11 MG daily Goal: 2.5-3.5 The patient does not have an INR goal. Excessive bruising: no Nose bleeding: no Rectal bleeding: no Prolonged menstrual cycles: N/A Eating diet with consistent amounts of foods containing Vitamin K:no Any recent antibiotic use? no  Relevant past medical, surgical, family and social history reviewed and updated as indicated. Interim medical history since our last visit reviewed. Allergies and medications reviewed and updated.  ROS: Per HPI unless specifically indicated above     Objective:    BP 132/81   Pulse 73   Temp 98.2 F (36.8 C) (Oral)   Ht 5' 6 (1.676 m)   Wt 253 lb 12.8 oz (115.1 kg)   SpO2 97%   BMI 40.96 kg/m   Wt Readings from Last 3 Encounters:  05/29/24 253 lb 12.8 oz (115.1 kg)  05/15/24 252 lb 3.2 oz (114.4 kg)  04/24/24 254 lb 6.4 oz (115.4 kg)    Physical Exam Vitals and nursing note reviewed.  Constitutional:      General: He is not in acute distress.    Appearance: He is well-developed and well-groomed. He is obese. He is not ill-appearing or toxic-appearing.  HENT:     Head: Normocephalic.     Right Ear: Hearing and external ear normal.     Left Ear: Hearing and external ear normal.     Nose: Nose normal.  Neck:     Thyroid : No thyromegaly.     Vascular: No carotid bruit.  Cardiovascular:      Rate and Rhythm: Normal rate and regular rhythm.     Heart sounds: No murmur heard.    No systolic murmur is present.     No diastolic murmur is present.     No gallop.  Pulmonary:     Effort: Pulmonary effort is normal. No accessory muscle usage or respiratory distress.     Breath sounds: Normal breath sounds. No decreased breath sounds, wheezing or rales.  Abdominal:     General: Abdomen is flat. Bowel sounds are normal. There is no distension.     Palpations: Abdomen is soft.     Tenderness: There is no abdominal tenderness.  Musculoskeletal:     Cervical back: Normal range of motion.     Right lower leg: No edema.     Left lower leg: No edema.  Lymphadenopathy:     Cervical: No cervical adenopathy.  Skin:    General: Skin is warm.  Neurological:     Mental Status: He is alert.  Psychiatric:        Attention and Perception: Attention normal.        Mood and Affect: Mood normal.        Speech: Speech normal.  Behavior: Behavior is cooperative.        Thought Content: Thought content normal.     Last INR: 2.0   Last CBC:  Lab Results  Component Value Date   WBC 8.5 04/24/2024   HGB 15.3 04/24/2024   HCT 47.6 04/24/2024   MCV 89 04/24/2024   PLT 195 04/24/2024    Results for orders placed or performed in visit on 05/15/24  CoaguChek XS/INR Waived   Collection Time: 05/15/24 11:33 AM  Result Value Ref Range   INR 2.0 (H) 0.9 - 1.1   Prothrombin Time 23.5 sec       Assessment:     ICD-10-CM   1. Chronic atrial fibrillation (HCC)  I48.20 CoaguChek XS/INR Waived    2. Type 2 diabetes mellitus with proteinuria (HCC)  E11.29 Microalbumin, Urine Waived   R80.9     3. Mechanical heart valve present  Z95.2 CoaguChek XS/INR Waived    4. Current use of long term anticoagulation  Z79.01 CoaguChek XS/INR Waived      Plan:   Discussed current plan face-to-face with patient. For coumadin  dosing, continue taking 11 MG daily. Will plan to recheck INR in 4  weeks. "

## 2024-05-29 NOTE — Progress Notes (Signed)
" ° °  05/29/2024  Patient ID: Rodney ONEIDA Kobus [name], male   DOB: May 08, 1961, 64 y.o.   MRN: [id number]  Returning missed call/voicemail from patient in regard to Jardiance  refill.  Patient states pharmacy does not have copay card information.  Contacted the pharmacy, and billing insurance and the patients copay card, the cost will be $237 for a 1 month supply.  Likely patient has a deductible to meet.  I have contacted the PA team to run a test claim for Farxiga  to see if this would be more affordable.  Channing DELENA Mealing, PharmD, DPLA  "

## 2024-05-29 NOTE — Assessment & Plan Note (Signed)
 Chronic, ongoing.  A1c 7.8% last visit despite recent steroid use. Urine ALB 18 May 2024. Cannot take Metformin  as caused reduced kidney function. Cannot take Glipizide due to risk for interaction with Coumadin . For now continue current regimen + collaboration with Channing PharmD.  Jardiance  offers benefit to BP. Discussed at length his very high risk with poor diabetes control - we have had multiple discussions on this.   - Refuses Flu, PCV20 in office today - ACE and statin on board - Eye and foot exams up to date

## 2024-05-29 NOTE — Assessment & Plan Note (Signed)
 Chronic, ongoing with Coumadin  use. INR 2.5 today, at goal range of 2.5 to 3.5. Continue Coumadin  11 MG daily. Not to take Ibuprofen due to risk for bleeding with this and only use Tylenol  or OTC Voltaren gel or Icy/Hot patches. Remain off Fenofibrate  and fish oil. No Rosuvastatin, is back to Atorvastatin .

## 2024-05-30 ENCOUNTER — Telehealth: Payer: Self-pay

## 2024-05-31 ENCOUNTER — Other Ambulatory Visit (HOSPITAL_COMMUNITY): Payer: Self-pay

## 2024-05-31 NOTE — Progress Notes (Signed)
" ° °  05/31/2024  Patient ID: Rodney ONEIDA Kobus [name], male   DOB: [birth year], 64 y.o.   MRN: [id number]  Response received from PA team, and generic Farxiga  is not on the insurance formulary, and brand would be around $200 with insurance and copay card.  This likely will not be affordable for patient, so I have contacted PCP to discuss further.  Likely that we will need to adjust insulin  or add sulfonylurea based on insurance limitations.   Channing DELENA Mealing, PharmD, DPLA  "

## 2024-05-31 NOTE — Progress Notes (Signed)
" ° °  05/31/2024  Patient ID: Rodney ONEIDA Kobus [name], male   DOB: January 03, 1961, 64 y.o.   MRN: [id number]  Contacted patient to let him know that the pharmacy is processing his insurance and copay card to process his Jardiance .  Copay with both will be $237 for 1 month due to a deductible of $10,600 on his insurance.  I have contacted the medication assistance team to check on the cost of Farxiga  and it's generic.  Waiting to hear back from them and will follow-up with the patient.  Channing DELENA Mealing, PharmD, DPLA  "

## 2024-06-03 ENCOUNTER — Telehealth: Payer: Self-pay

## 2024-06-03 DIAGNOSIS — E1142 Type 2 diabetes mellitus with diabetic polyneuropathy: Secondary | ICD-10-CM

## 2024-06-03 NOTE — Progress Notes (Signed)
" ° °  06/03/2024  Patient ID: Rodney ONEIDA Kobus [name], male   DOB: 07-08-1960, 64 y.o.   MRN: [id number]  Jardiance  no longer affordable based on insurance deductible, and Farxiga  was no cheaper.  Advised patient to continue current insulin  regimen at this time, monitor and record BG regularly, and we have a follow-up in 2 weeks to determine if insulin  dosing needs to be adjusted.  Patient will qualify for Medicare later this year, and hopefully that will open different treatment options back up.  Rodney Dixon, PharmD, DPLA  "

## 2024-06-12 ENCOUNTER — Telehealth: Payer: Self-pay

## 2024-06-12 DIAGNOSIS — E1142 Type 2 diabetes mellitus with diabetic polyneuropathy: Secondary | ICD-10-CM

## 2024-06-12 MED ORDER — LANTUS SOLOSTAR 100 UNIT/ML ~~LOC~~ SOPN: 60.0000 [IU] | PEN_INJECTOR | Freq: Every day | SUBCUTANEOUS | 99 refills | Status: AC

## 2024-06-12 NOTE — Progress Notes (Signed)
" ° °  06/12/2024  Patient ID: Rodney ONEIDA Kobus [name], male   DOB: 04/18/1961, 64 y.o.   MRN: [id number]  Returning missed call/voicemail from patient.  [facility] informed him they are having issues getting basaglar  in consistently and recommended provider change to alternative basal insulin  covered by his plan.  Test claim reflects they will cover Lantus , and copay card will make this $35, which is what he has been paying for basaglar .  Order pending along with copay card information for pharmacy to bill.  Patient also informed me he was not able to see diabetic specialist with [facility], because they do not accept his insurance- making PCP aware.  We have a telephone follow-up scheduled for 2/9.    Channing DELENA Mealing, PharmD, DPLA  "

## 2024-06-17 ENCOUNTER — Other Ambulatory Visit

## 2024-06-25 ENCOUNTER — Other Ambulatory Visit

## 2024-06-26 ENCOUNTER — Ambulatory Visit: Admitting: Nurse Practitioner
